# Patient Record
Sex: Female | Born: 1935 | Race: Black or African American | Hispanic: No | Marital: Single | Smoking: Never smoker
Health system: Southern US, Community
[De-identification: ages and names within clinical notes are randomized; demographics above are authoritative.]

## PROBLEM LIST (undated history)

## (undated) DIAGNOSIS — H269 Unspecified cataract: Secondary | ICD-10-CM

## (undated) DIAGNOSIS — I2510 Atherosclerotic heart disease of native coronary artery without angina pectoris: Secondary | ICD-10-CM

## (undated) DIAGNOSIS — R160 Hepatomegaly, not elsewhere classified: Secondary | ICD-10-CM

## (undated) DIAGNOSIS — F99 Mental disorder, not otherwise specified: Secondary | ICD-10-CM

## (undated) DIAGNOSIS — K219 Gastro-esophageal reflux disease without esophagitis: Secondary | ICD-10-CM

## (undated) DIAGNOSIS — F419 Anxiety disorder, unspecified: Secondary | ICD-10-CM

## (undated) DIAGNOSIS — E039 Hypothyroidism, unspecified: Secondary | ICD-10-CM

## (undated) DIAGNOSIS — S90562A Insect bite (nonvenomous), left ankle, initial encounter: Secondary | ICD-10-CM

## (undated) DIAGNOSIS — R51 Headache: Secondary | ICD-10-CM

## (undated) DIAGNOSIS — J189 Pneumonia, unspecified organism: Secondary | ICD-10-CM

## (undated) DIAGNOSIS — R252 Cramp and spasm: Secondary | ICD-10-CM

## (undated) DIAGNOSIS — I219 Acute myocardial infarction, unspecified: Secondary | ICD-10-CM

## (undated) DIAGNOSIS — K229 Disease of esophagus, unspecified: Secondary | ICD-10-CM

## (undated) DIAGNOSIS — E785 Hyperlipidemia, unspecified: Secondary | ICD-10-CM

## (undated) DIAGNOSIS — E7800 Pure hypercholesterolemia, unspecified: Secondary | ICD-10-CM

## (undated) DIAGNOSIS — D72819 Decreased white blood cell count, unspecified: Secondary | ICD-10-CM

## (undated) DIAGNOSIS — D649 Anemia, unspecified: Secondary | ICD-10-CM

## (undated) DIAGNOSIS — W57XXXA Bitten or stung by nonvenomous insect and other nonvenomous arthropods, initial encounter: Secondary | ICD-10-CM

## (undated) DIAGNOSIS — N2889 Other specified disorders of kidney and ureter: Secondary | ICD-10-CM

## (undated) DIAGNOSIS — I209 Angina pectoris, unspecified: Secondary | ICD-10-CM

## (undated) DIAGNOSIS — M1990 Unspecified osteoarthritis, unspecified site: Secondary | ICD-10-CM

## (undated) DIAGNOSIS — I10 Essential (primary) hypertension: Secondary | ICD-10-CM

## (undated) HISTORY — PX: TONSILLECTOMY: SHX5217

## (undated) HISTORY — DX: Decreased white blood cell count, unspecified: D72.819

## (undated) HISTORY — DX: Hyperlipidemia, unspecified: E78.5

## (undated) HISTORY — DX: Gastro-esophageal reflux disease without esophagitis: K21.9

## (undated) HISTORY — DX: Atherosclerotic heart disease of native coronary artery without angina pectoris: I25.10

## (undated) HISTORY — DX: Pneumonia, unspecified organism: J18.9

## (undated) HISTORY — PX: APPENDECTOMY: SHX54

## (undated) HISTORY — PX: ABDOMINAL HYSTERECTOMY: SHX81

## (undated) HISTORY — DX: Essential (primary) hypertension: I10

## (undated) HISTORY — PX: CHOLECYSTECTOMY OPEN: SUR202

## (undated) HISTORY — PX: BREAST BIOPSY: SHX20

## (undated) HISTORY — PX: HYSTEROTOMY: SHX1776

## (undated) HISTORY — PX: BREAST EXCISIONAL BIOPSY: SUR124

## (undated) HISTORY — DX: Hypothyroidism, unspecified: E03.9

## (undated) HISTORY — DX: Anemia, unspecified: D64.9

---

## 1997-10-27 ENCOUNTER — Inpatient Hospital Stay (HOSPITAL_COMMUNITY): Admission: EM | Admit: 1997-10-27 | Discharge: 1997-10-28 | Payer: Self-pay | Admitting: Emergency Medicine

## 1997-11-04 ENCOUNTER — Encounter: Admission: RE | Admit: 1997-11-04 | Discharge: 1997-11-04 | Payer: Self-pay | Admitting: Internal Medicine

## 1997-11-27 ENCOUNTER — Encounter: Admission: RE | Admit: 1997-11-27 | Discharge: 1997-11-27 | Payer: Self-pay | Admitting: Hematology and Oncology

## 1997-12-09 ENCOUNTER — Encounter: Admission: RE | Admit: 1997-12-09 | Discharge: 1997-12-09 | Payer: Self-pay | Admitting: Internal Medicine

## 1998-01-03 ENCOUNTER — Encounter: Admission: RE | Admit: 1998-01-03 | Discharge: 1998-01-03 | Payer: Self-pay | Admitting: Internal Medicine

## 1998-01-07 ENCOUNTER — Ambulatory Visit (HOSPITAL_COMMUNITY): Admission: RE | Admit: 1998-01-07 | Discharge: 1998-01-07 | Payer: Self-pay | Admitting: Internal Medicine

## 1998-02-14 ENCOUNTER — Encounter: Admission: RE | Admit: 1998-02-14 | Discharge: 1998-02-14 | Payer: Self-pay | Admitting: Internal Medicine

## 1998-04-02 ENCOUNTER — Encounter: Admission: RE | Admit: 1998-04-02 | Discharge: 1998-04-02 | Payer: Self-pay | Admitting: *Deleted

## 1998-04-15 ENCOUNTER — Encounter: Admission: RE | Admit: 1998-04-15 | Discharge: 1998-04-15 | Payer: Self-pay | Admitting: Internal Medicine

## 1998-04-28 ENCOUNTER — Encounter: Admission: RE | Admit: 1998-04-28 | Discharge: 1998-04-28 | Payer: Self-pay | Admitting: Internal Medicine

## 1998-05-14 ENCOUNTER — Other Ambulatory Visit: Admission: RE | Admit: 1998-05-14 | Discharge: 1998-05-14 | Payer: Self-pay | Admitting: Gastroenterology

## 1998-05-14 ENCOUNTER — Encounter: Payer: Self-pay | Admitting: Gastroenterology

## 1998-06-02 ENCOUNTER — Encounter: Admission: RE | Admit: 1998-06-02 | Discharge: 1998-06-02 | Payer: Self-pay | Admitting: Internal Medicine

## 1998-07-01 ENCOUNTER — Encounter: Admission: RE | Admit: 1998-07-01 | Discharge: 1998-07-01 | Payer: Self-pay | Admitting: Internal Medicine

## 1998-08-01 ENCOUNTER — Encounter: Admission: RE | Admit: 1998-08-01 | Discharge: 1998-08-01 | Payer: Self-pay | Admitting: Internal Medicine

## 1998-09-02 ENCOUNTER — Ambulatory Visit (HOSPITAL_COMMUNITY): Admission: RE | Admit: 1998-09-02 | Discharge: 1998-09-02 | Payer: Self-pay

## 1998-09-12 ENCOUNTER — Encounter: Admission: RE | Admit: 1998-09-12 | Discharge: 1998-09-12 | Payer: Self-pay | Admitting: Internal Medicine

## 1998-09-16 ENCOUNTER — Encounter: Admission: RE | Admit: 1998-09-16 | Discharge: 1998-09-16 | Payer: Self-pay | Admitting: Hematology and Oncology

## 1998-10-16 ENCOUNTER — Encounter: Admission: RE | Admit: 1998-10-16 | Discharge: 1998-10-16 | Payer: Self-pay | Admitting: Internal Medicine

## 1998-10-20 ENCOUNTER — Encounter: Payer: Self-pay | Admitting: *Deleted

## 1998-10-20 ENCOUNTER — Ambulatory Visit (HOSPITAL_COMMUNITY): Admission: RE | Admit: 1998-10-20 | Discharge: 1998-10-20 | Payer: Self-pay | Admitting: *Deleted

## 1998-11-11 ENCOUNTER — Encounter: Admission: RE | Admit: 1998-11-11 | Discharge: 1998-11-11 | Payer: Self-pay | Admitting: Hematology and Oncology

## 1998-11-13 ENCOUNTER — Ambulatory Visit (HOSPITAL_COMMUNITY): Admission: RE | Admit: 1998-11-13 | Discharge: 1998-11-13 | Payer: Self-pay | Admitting: Internal Medicine

## 1999-01-05 ENCOUNTER — Encounter: Admission: RE | Admit: 1999-01-05 | Discharge: 1999-01-05 | Payer: Self-pay | Admitting: Internal Medicine

## 1999-01-30 ENCOUNTER — Encounter: Admission: RE | Admit: 1999-01-30 | Discharge: 1999-01-30 | Payer: Self-pay | Admitting: Internal Medicine

## 1999-02-07 ENCOUNTER — Emergency Department (HOSPITAL_COMMUNITY): Admission: EM | Admit: 1999-02-07 | Discharge: 1999-02-07 | Payer: Self-pay | Admitting: Emergency Medicine

## 1999-02-08 ENCOUNTER — Encounter: Payer: Self-pay | Admitting: Emergency Medicine

## 1999-02-12 ENCOUNTER — Encounter: Admission: RE | Admit: 1999-02-12 | Discharge: 1999-02-12 | Payer: Self-pay | Admitting: Internal Medicine

## 1999-02-24 ENCOUNTER — Ambulatory Visit (HOSPITAL_COMMUNITY): Admission: RE | Admit: 1999-02-24 | Discharge: 1999-02-24 | Payer: Self-pay | Admitting: Gastroenterology

## 1999-03-11 ENCOUNTER — Ambulatory Visit (HOSPITAL_COMMUNITY): Admission: RE | Admit: 1999-03-11 | Discharge: 1999-03-11 | Payer: Self-pay | Admitting: Gastroenterology

## 1999-04-06 ENCOUNTER — Encounter: Admission: RE | Admit: 1999-04-06 | Discharge: 1999-04-06 | Payer: Self-pay | Admitting: Internal Medicine

## 1999-05-20 ENCOUNTER — Encounter: Admission: RE | Admit: 1999-05-20 | Discharge: 1999-05-20 | Payer: Self-pay | Admitting: Hematology and Oncology

## 1999-06-29 ENCOUNTER — Encounter: Admission: RE | Admit: 1999-06-29 | Discharge: 1999-06-29 | Payer: Self-pay | Admitting: Internal Medicine

## 1999-07-06 ENCOUNTER — Encounter: Admission: RE | Admit: 1999-07-06 | Discharge: 1999-07-06 | Payer: Self-pay | Admitting: Internal Medicine

## 1999-09-04 ENCOUNTER — Ambulatory Visit (HOSPITAL_COMMUNITY): Admission: RE | Admit: 1999-09-04 | Discharge: 1999-09-04 | Payer: Self-pay | Admitting: *Deleted

## 1999-10-06 ENCOUNTER — Encounter: Admission: RE | Admit: 1999-10-06 | Discharge: 1999-10-06 | Payer: Self-pay | Admitting: Internal Medicine

## 1999-10-06 ENCOUNTER — Encounter: Payer: Self-pay | Admitting: Internal Medicine

## 1999-11-21 ENCOUNTER — Encounter: Payer: Self-pay | Admitting: Emergency Medicine

## 1999-11-21 ENCOUNTER — Emergency Department (HOSPITAL_COMMUNITY): Admission: EM | Admit: 1999-11-21 | Discharge: 1999-11-21 | Payer: Self-pay | Admitting: Emergency Medicine

## 2000-01-07 ENCOUNTER — Encounter: Payer: Self-pay | Admitting: *Deleted

## 2000-01-07 ENCOUNTER — Ambulatory Visit (HOSPITAL_COMMUNITY): Admission: RE | Admit: 2000-01-07 | Discharge: 2000-01-07 | Payer: Self-pay | Admitting: *Deleted

## 2000-01-07 ENCOUNTER — Encounter (INDEPENDENT_AMBULATORY_CARE_PROVIDER_SITE_OTHER): Payer: Self-pay | Admitting: Specialist

## 2000-01-21 ENCOUNTER — Encounter (INDEPENDENT_AMBULATORY_CARE_PROVIDER_SITE_OTHER): Payer: Self-pay | Admitting: Specialist

## 2000-01-21 ENCOUNTER — Ambulatory Visit (HOSPITAL_COMMUNITY): Admission: RE | Admit: 2000-01-21 | Discharge: 2000-01-21 | Payer: Self-pay | Admitting: *Deleted

## 2000-04-20 ENCOUNTER — Encounter: Payer: Self-pay | Admitting: Internal Medicine

## 2000-04-20 ENCOUNTER — Encounter: Admission: RE | Admit: 2000-04-20 | Discharge: 2000-04-20 | Payer: Self-pay | Admitting: Internal Medicine

## 2000-05-02 ENCOUNTER — Encounter: Admission: RE | Admit: 2000-05-02 | Discharge: 2000-05-24 | Payer: Self-pay | Admitting: Orthopedic Surgery

## 2000-09-05 ENCOUNTER — Ambulatory Visit (HOSPITAL_COMMUNITY): Admission: RE | Admit: 2000-09-05 | Discharge: 2000-09-05 | Payer: Self-pay | Admitting: Internal Medicine

## 2000-09-05 ENCOUNTER — Encounter: Payer: Self-pay | Admitting: Internal Medicine

## 2000-09-17 ENCOUNTER — Encounter: Payer: Self-pay | Admitting: Emergency Medicine

## 2000-09-17 ENCOUNTER — Emergency Department (HOSPITAL_COMMUNITY): Admission: EM | Admit: 2000-09-17 | Discharge: 2000-09-17 | Payer: Self-pay | Admitting: Emergency Medicine

## 2001-01-10 ENCOUNTER — Encounter: Admission: RE | Admit: 2001-01-10 | Discharge: 2001-03-02 | Payer: Self-pay | Admitting: Orthopedic Surgery

## 2001-04-04 ENCOUNTER — Encounter: Payer: Self-pay | Admitting: Orthopedic Surgery

## 2001-04-04 ENCOUNTER — Inpatient Hospital Stay (HOSPITAL_COMMUNITY): Admission: RE | Admit: 2001-04-04 | Discharge: 2001-04-11 | Payer: Self-pay | Admitting: Orthopedic Surgery

## 2001-04-11 ENCOUNTER — Inpatient Hospital Stay
Admission: RE | Admit: 2001-04-11 | Discharge: 2001-04-22 | Payer: Self-pay | Admitting: Physical Medicine & Rehabilitation

## 2001-05-09 ENCOUNTER — Other Ambulatory Visit: Admission: RE | Admit: 2001-05-09 | Discharge: 2001-05-09 | Payer: Self-pay | Admitting: Internal Medicine

## 2001-08-29 ENCOUNTER — Encounter: Payer: Self-pay | Admitting: Internal Medicine

## 2001-08-29 ENCOUNTER — Encounter: Admission: RE | Admit: 2001-08-29 | Discharge: 2001-08-29 | Payer: Self-pay | Admitting: Internal Medicine

## 2001-09-06 ENCOUNTER — Ambulatory Visit (HOSPITAL_COMMUNITY): Admission: RE | Admit: 2001-09-06 | Discharge: 2001-09-06 | Payer: Self-pay | Admitting: Internal Medicine

## 2001-09-06 ENCOUNTER — Encounter: Payer: Self-pay | Admitting: Internal Medicine

## 2002-01-11 ENCOUNTER — Encounter: Payer: Self-pay | Admitting: Emergency Medicine

## 2002-01-11 ENCOUNTER — Emergency Department (HOSPITAL_COMMUNITY): Admission: EM | Admit: 2002-01-11 | Discharge: 2002-01-11 | Payer: Self-pay | Admitting: Emergency Medicine

## 2002-09-07 ENCOUNTER — Ambulatory Visit (HOSPITAL_COMMUNITY): Admission: RE | Admit: 2002-09-07 | Discharge: 2002-09-07 | Payer: Self-pay | Admitting: Internal Medicine

## 2002-09-07 ENCOUNTER — Encounter: Payer: Self-pay | Admitting: Internal Medicine

## 2002-11-08 ENCOUNTER — Encounter: Payer: Self-pay | Admitting: Emergency Medicine

## 2002-11-08 ENCOUNTER — Emergency Department (HOSPITAL_COMMUNITY): Admission: EM | Admit: 2002-11-08 | Discharge: 2002-11-08 | Payer: Self-pay | Admitting: Emergency Medicine

## 2003-01-04 ENCOUNTER — Encounter: Payer: Self-pay | Admitting: *Deleted

## 2003-01-04 ENCOUNTER — Emergency Department (HOSPITAL_COMMUNITY): Admission: EM | Admit: 2003-01-04 | Discharge: 2003-01-04 | Payer: Self-pay | Admitting: *Deleted

## 2003-01-07 ENCOUNTER — Ambulatory Visit (HOSPITAL_COMMUNITY): Admission: RE | Admit: 2003-01-07 | Discharge: 2003-01-07 | Payer: Self-pay | Admitting: Gastroenterology

## 2003-01-07 ENCOUNTER — Encounter: Payer: Self-pay | Admitting: Gastroenterology

## 2003-02-08 ENCOUNTER — Encounter: Admission: RE | Admit: 2003-02-08 | Discharge: 2003-02-08 | Payer: Self-pay | Admitting: Internal Medicine

## 2003-02-08 ENCOUNTER — Encounter: Payer: Self-pay | Admitting: Internal Medicine

## 2003-02-28 ENCOUNTER — Ambulatory Visit (HOSPITAL_COMMUNITY): Admission: RE | Admit: 2003-02-28 | Discharge: 2003-02-28 | Payer: Self-pay | Admitting: Internal Medicine

## 2003-03-06 ENCOUNTER — Encounter: Admission: RE | Admit: 2003-03-06 | Discharge: 2003-03-06 | Payer: Self-pay | Admitting: Internal Medicine

## 2003-03-06 ENCOUNTER — Encounter: Payer: Self-pay | Admitting: Internal Medicine

## 2003-03-20 ENCOUNTER — Emergency Department (HOSPITAL_COMMUNITY): Admission: EM | Admit: 2003-03-20 | Discharge: 2003-03-20 | Payer: Self-pay | Admitting: Emergency Medicine

## 2003-03-20 ENCOUNTER — Encounter: Payer: Self-pay | Admitting: Emergency Medicine

## 2003-09-11 ENCOUNTER — Ambulatory Visit (HOSPITAL_COMMUNITY): Admission: RE | Admit: 2003-09-11 | Discharge: 2003-09-11 | Payer: Self-pay | Admitting: Internal Medicine

## 2003-11-02 ENCOUNTER — Inpatient Hospital Stay (HOSPITAL_COMMUNITY): Admission: EM | Admit: 2003-11-02 | Discharge: 2003-11-07 | Payer: Self-pay | Admitting: Emergency Medicine

## 2003-12-16 ENCOUNTER — Encounter (HOSPITAL_COMMUNITY): Admission: RE | Admit: 2003-12-16 | Discharge: 2004-03-15 | Payer: Self-pay | Admitting: Cardiovascular Disease

## 2004-03-16 ENCOUNTER — Encounter (HOSPITAL_COMMUNITY): Admission: RE | Admit: 2004-03-16 | Discharge: 2004-04-13 | Payer: Self-pay | Admitting: Cardiovascular Disease

## 2004-04-07 ENCOUNTER — Ambulatory Visit (HOSPITAL_COMMUNITY): Admission: RE | Admit: 2004-04-07 | Discharge: 2004-04-07 | Payer: Self-pay | Admitting: Internal Medicine

## 2004-07-27 ENCOUNTER — Other Ambulatory Visit: Admission: RE | Admit: 2004-07-27 | Discharge: 2004-07-27 | Payer: Self-pay | Admitting: Internal Medicine

## 2004-09-07 ENCOUNTER — Encounter (INDEPENDENT_AMBULATORY_CARE_PROVIDER_SITE_OTHER): Payer: Self-pay | Admitting: *Deleted

## 2004-09-07 ENCOUNTER — Ambulatory Visit (HOSPITAL_COMMUNITY): Admission: RE | Admit: 2004-09-07 | Discharge: 2004-09-07 | Payer: Self-pay | Admitting: Gastroenterology

## 2004-09-07 ENCOUNTER — Encounter (INDEPENDENT_AMBULATORY_CARE_PROVIDER_SITE_OTHER): Payer: Self-pay | Admitting: Specialist

## 2004-09-15 ENCOUNTER — Ambulatory Visit (HOSPITAL_COMMUNITY): Admission: RE | Admit: 2004-09-15 | Discharge: 2004-09-15 | Payer: Self-pay | Admitting: Internal Medicine

## 2004-11-18 ENCOUNTER — Ambulatory Visit: Payer: Self-pay | Admitting: Family Medicine

## 2004-12-09 ENCOUNTER — Ambulatory Visit: Payer: Self-pay | Admitting: Family Medicine

## 2005-01-12 ENCOUNTER — Ambulatory Visit: Payer: Self-pay | Admitting: Family Medicine

## 2005-01-20 ENCOUNTER — Ambulatory Visit: Payer: Self-pay | Admitting: Family Medicine

## 2005-02-19 ENCOUNTER — Ambulatory Visit: Payer: Self-pay | Admitting: Family Medicine

## 2005-02-28 ENCOUNTER — Inpatient Hospital Stay (HOSPITAL_COMMUNITY): Admission: EM | Admit: 2005-02-28 | Discharge: 2005-03-01 | Payer: Self-pay | Admitting: Emergency Medicine

## 2005-02-28 ENCOUNTER — Ambulatory Visit: Payer: Self-pay | Admitting: Family Medicine

## 2005-03-19 ENCOUNTER — Ambulatory Visit: Payer: Self-pay | Admitting: Sports Medicine

## 2005-04-16 ENCOUNTER — Encounter (INDEPENDENT_AMBULATORY_CARE_PROVIDER_SITE_OTHER): Payer: Self-pay | Admitting: Specialist

## 2005-04-16 ENCOUNTER — Encounter (INDEPENDENT_AMBULATORY_CARE_PROVIDER_SITE_OTHER): Payer: Self-pay | Admitting: *Deleted

## 2005-04-16 ENCOUNTER — Ambulatory Visit (HOSPITAL_COMMUNITY): Admission: RE | Admit: 2005-04-16 | Discharge: 2005-04-16 | Payer: Self-pay | Admitting: Gastroenterology

## 2005-05-11 ENCOUNTER — Ambulatory Visit: Payer: Self-pay | Admitting: Family Medicine

## 2005-06-09 ENCOUNTER — Ambulatory Visit: Payer: Self-pay | Admitting: Family Medicine

## 2005-09-28 ENCOUNTER — Ambulatory Visit (HOSPITAL_COMMUNITY): Admission: RE | Admit: 2005-09-28 | Discharge: 2005-09-28 | Payer: Self-pay | Admitting: Family Medicine

## 2005-10-05 ENCOUNTER — Ambulatory Visit (HOSPITAL_COMMUNITY): Admission: RE | Admit: 2005-10-05 | Discharge: 2005-10-05 | Payer: Self-pay | Admitting: Internal Medicine

## 2005-10-29 ENCOUNTER — Inpatient Hospital Stay (HOSPITAL_BASED_OUTPATIENT_CLINIC_OR_DEPARTMENT_OTHER): Admission: RE | Admit: 2005-10-29 | Discharge: 2005-10-29 | Payer: Self-pay | Admitting: Cardiovascular Disease

## 2005-11-08 ENCOUNTER — Ambulatory Visit: Payer: Self-pay | Admitting: Internal Medicine

## 2005-11-15 LAB — CBC WITH DIFFERENTIAL/PLATELET
BASO%: 1 % (ref 0.0–2.0)
Basophils Absolute: 0 10*3/uL (ref 0.0–0.1)
EOS%: 0.9 % (ref 0.0–7.0)
Eosinophils Absolute: 0 10*3/uL (ref 0.0–0.5)
HCT: 36.5 % (ref 34.8–46.6)
HGB: 11.8 g/dL (ref 11.6–15.9)
LYMPH%: 39.8 % (ref 14.0–48.0)
MCH: 24 pg — ABNORMAL LOW (ref 26.0–34.0)
MCHC: 32.5 g/dL (ref 32.0–36.0)
MCV: 73.8 fL — ABNORMAL LOW (ref 81.0–101.0)
MONO#: 0.6 10*3/uL (ref 0.1–0.9)
MONO%: 14.5 % — ABNORMAL HIGH (ref 0.0–13.0)
NEUT#: 1.7 10*3/uL (ref 1.5–6.5)
NEUT%: 43.8 % (ref 39.6–76.8)
Platelets: 245 10*3/uL (ref 145–400)
RBC: 4.94 10*6/uL (ref 3.70–5.32)
RDW: 15.5 % — ABNORMAL HIGH (ref 11.3–14.5)
WBC: 3.9 10*3/uL (ref 3.9–10.0)
lymph#: 1.6 10*3/uL (ref 0.9–3.3)

## 2005-11-17 LAB — COMPREHENSIVE METABOLIC PANEL
ALT: 14 U/L (ref 0–40)
AST: 20 U/L (ref 0–37)
Albumin: 4 g/dL (ref 3.5–5.2)
Alkaline Phosphatase: 84 U/L (ref 39–117)
BUN: 10 mg/dL (ref 6–23)
CO2: 29 mEq/L (ref 19–32)
Calcium: 9.1 mg/dL (ref 8.4–10.5)
Chloride: 99 mEq/L (ref 96–112)
Creatinine, Ser: 0.7 mg/dL (ref 0.4–1.2)
Glucose, Bld: 76 mg/dL (ref 70–99)
Potassium: 4.4 mEq/L (ref 3.5–5.3)
Sodium: 136 mEq/L (ref 135–145)
Total Bilirubin: 0.3 mg/dL (ref 0.3–1.2)
Total Protein: 6.9 g/dL (ref 6.0–8.3)

## 2005-11-17 LAB — PROTEIN ELECTROPHORESIS, SERUM
Albumin ELP: 56.3 % (ref 55.8–66.1)
Alpha-1-Globulin: 4.6 % (ref 2.9–4.9)
Alpha-2-Globulin: 8.6 % (ref 7.1–11.8)
Beta 2: 5.2 % (ref 3.2–6.5)
Beta Globulin: 5.4 % (ref 4.7–7.2)
Gamma Globulin: 19.9 % — ABNORMAL HIGH (ref 11.1–18.8)
Total Protein, Serum Electrophoresis: 6.9 g/dL (ref 6.0–8.3)

## 2005-11-17 LAB — IRON AND TIBC
%SAT: 23 % (ref 20–55)
Iron: 61 ug/dL (ref 42–145)
TIBC: 267 ug/dL (ref 250–470)
UIBC: 206 ug/dL

## 2005-11-17 LAB — VITAMIN B12: Vitamin B-12: 925 pg/mL — ABNORMAL HIGH (ref 211–911)

## 2005-11-17 LAB — FOLATE RBC: RBC Folate: 460 ng/mL (ref 180–600)

## 2005-11-17 LAB — LACTATE DEHYDROGENASE: LDH: 161 U/L (ref 94–250)

## 2005-11-17 LAB — FERRITIN: Ferritin: 99 ng/mL (ref 10–291)

## 2005-12-01 LAB — CBC WITH DIFFERENTIAL/PLATELET
BASO%: 0.7 % (ref 0.0–2.0)
Basophils Absolute: 0 10*3/uL (ref 0.0–0.1)
EOS%: 1.5 % (ref 0.0–7.0)
Eosinophils Absolute: 0.1 10*3/uL (ref 0.0–0.5)
HCT: 34.7 % — ABNORMAL LOW (ref 34.8–46.6)
HGB: 11.2 g/dL — ABNORMAL LOW (ref 11.6–15.9)
LYMPH%: 47 % (ref 14.0–48.0)
MCH: 23.7 pg — ABNORMAL LOW (ref 26.0–34.0)
MCHC: 32.3 g/dL (ref 32.0–36.0)
MCV: 73.4 fL — ABNORMAL LOW (ref 81.0–101.0)
MONO#: 0.5 10*3/uL (ref 0.1–0.9)
MONO%: 15.6 % — ABNORMAL HIGH (ref 0.0–13.0)
NEUT#: 1.2 10*3/uL — ABNORMAL LOW (ref 1.5–6.5)
NEUT%: 35.2 % — ABNORMAL LOW (ref 39.6–76.8)
Platelets: 181 10*3/uL (ref 145–400)
RBC: 4.73 10*6/uL (ref 3.70–5.32)
RDW: 15.4 % — ABNORMAL HIGH (ref 11.3–14.5)
WBC: 3.3 10*3/uL — ABNORMAL LOW (ref 3.9–10.0)
lymph#: 1.6 10*3/uL (ref 0.9–3.3)

## 2005-12-01 LAB — LACTATE DEHYDROGENASE: LDH: 164 U/L (ref 94–250)

## 2005-12-15 ENCOUNTER — Ambulatory Visit (HOSPITAL_COMMUNITY): Admission: RE | Admit: 2005-12-15 | Discharge: 2005-12-15 | Payer: Self-pay | Admitting: Internal Medicine

## 2005-12-15 ENCOUNTER — Encounter (INDEPENDENT_AMBULATORY_CARE_PROVIDER_SITE_OTHER): Payer: Self-pay | Admitting: Specialist

## 2005-12-15 ENCOUNTER — Ambulatory Visit: Payer: Self-pay | Admitting: Internal Medicine

## 2005-12-21 LAB — CBC WITH DIFFERENTIAL/PLATELET
BASO%: 0.5 % (ref 0.0–2.0)
Basophils Absolute: 0 10*3/uL (ref 0.0–0.1)
EOS%: 1.2 % (ref 0.0–7.0)
Eosinophils Absolute: 0 10*3/uL (ref 0.0–0.5)
HCT: 35.3 % (ref 34.8–46.6)
HGB: 11.4 g/dL — ABNORMAL LOW (ref 11.6–15.9)
LYMPH%: 44.8 % (ref 14.0–48.0)
MCH: 23.5 pg — ABNORMAL LOW (ref 26.0–34.0)
MCHC: 32.1 g/dL (ref 32.0–36.0)
MCV: 73.3 fL — ABNORMAL LOW (ref 81.0–101.0)
MONO#: 0.6 10*3/uL (ref 0.1–0.9)
MONO%: 21.7 % — ABNORMAL HIGH (ref 0.0–13.0)
NEUT#: 0.9 10*3/uL — ABNORMAL LOW (ref 1.5–6.5)
NEUT%: 31.8 % — ABNORMAL LOW (ref 39.6–76.8)
Platelets: 186 10*3/uL (ref 145–400)
RBC: 4.82 10*6/uL (ref 3.70–5.32)
RDW: 14.6 % — ABNORMAL HIGH (ref 11.3–14.5)
WBC: 3 10*3/uL — ABNORMAL LOW (ref 3.9–10.0)
lymph#: 1.3 10*3/uL (ref 0.9–3.3)

## 2005-12-28 ENCOUNTER — Ambulatory Visit (HOSPITAL_COMMUNITY): Admission: RE | Admit: 2005-12-28 | Discharge: 2005-12-28 | Payer: Self-pay | Admitting: Internal Medicine

## 2006-01-25 LAB — CBC WITH DIFFERENTIAL/PLATELET
BASO%: 0.8 % (ref 0.0–2.0)
Basophils Absolute: 0 10*3/uL (ref 0.0–0.1)
EOS%: 2.3 % (ref 0.0–7.0)
Eosinophils Absolute: 0.1 10*3/uL (ref 0.0–0.5)
HCT: 34.6 % — ABNORMAL LOW (ref 34.8–46.6)
HGB: 11 g/dL — ABNORMAL LOW (ref 11.6–15.9)
LYMPH%: 45.9 % (ref 14.0–48.0)
MCH: 22.8 pg — ABNORMAL LOW (ref 26.0–34.0)
MCHC: 31.9 g/dL — ABNORMAL LOW (ref 32.0–36.0)
MCV: 71.5 fL — ABNORMAL LOW (ref 81.0–101.0)
MONO#: 0.8 10*3/uL (ref 0.1–0.9)
MONO%: 21 % — ABNORMAL HIGH (ref 0.0–13.0)
NEUT#: 1.1 10*3/uL — ABNORMAL LOW (ref 1.5–6.5)
NEUT%: 30 % — ABNORMAL LOW (ref 39.6–76.8)
Platelets: 200 10*3/uL (ref 145–400)
RBC: 4.84 10*6/uL (ref 3.70–5.32)
RDW: 13.9 % (ref 11.3–14.5)
WBC: 3.6 10*3/uL — ABNORMAL LOW (ref 3.9–10.0)
lymph#: 1.7 10*3/uL (ref 0.9–3.3)

## 2006-01-25 LAB — LACTATE DEHYDROGENASE: LDH: 179 U/L (ref 94–250)

## 2006-02-04 ENCOUNTER — Ambulatory Visit: Payer: Self-pay | Admitting: Internal Medicine

## 2006-02-08 ENCOUNTER — Ambulatory Visit: Payer: Self-pay | Admitting: *Deleted

## 2006-02-22 ENCOUNTER — Ambulatory Visit: Payer: Self-pay | Admitting: Internal Medicine

## 2006-02-24 LAB — CBC WITH DIFFERENTIAL/PLATELET
BASO%: 3 % — ABNORMAL HIGH (ref 0.0–2.0)
Basophils Absolute: 0.1 10*3/uL (ref 0.0–0.1)
EOS%: 2.4 % (ref 0.0–7.0)
Eosinophils Absolute: 0.1 10*3/uL (ref 0.0–0.5)
HCT: 32.3 % — ABNORMAL LOW (ref 34.8–46.6)
HGB: 10.4 g/dL — ABNORMAL LOW (ref 11.6–15.9)
LYMPH%: 51.1 % — ABNORMAL HIGH (ref 14.0–48.0)
MCH: 22.1 pg — ABNORMAL LOW (ref 26.0–34.0)
MCHC: 32.2 g/dL (ref 32.0–36.0)
MCV: 68.8 fL — ABNORMAL LOW (ref 81.0–101.0)
MONO#: 0.6 10*3/uL (ref 0.1–0.9)
MONO%: 18.7 % — ABNORMAL HIGH (ref 0.0–13.0)
NEUT#: 0.8 10*3/uL — ABNORMAL LOW (ref 1.5–6.5)
NEUT%: 24.8 % — ABNORMAL LOW (ref 39.6–76.8)
Platelets: 171 10*3/uL (ref 145–400)
RBC: 4.69 10*6/uL (ref 3.70–5.32)
RDW: 14.5 % (ref 11.3–14.5)
WBC: 3.2 10*3/uL — ABNORMAL LOW (ref 3.9–10.0)
lymph#: 1.6 10*3/uL (ref 0.9–3.3)

## 2006-02-24 LAB — LACTATE DEHYDROGENASE: LDH: 165 U/L (ref 94–250)

## 2006-02-28 LAB — HEMOGLOBINOPATHY EVALUATION
Hgb A2 Quant: 2.5 % (ref 2.2–3.2)
Hgb A: 96.8 % (ref 96.8–97.8)
Hgb F Quant: 0.7 % — ABNORMAL HIGH (ref 0.0–0.5)
Hgb S Quant: 0 % (ref 0.0–0.0)

## 2006-03-10 ENCOUNTER — Ambulatory Visit: Payer: Self-pay | Admitting: Internal Medicine

## 2006-03-22 ENCOUNTER — Ambulatory Visit: Payer: Self-pay | Admitting: Internal Medicine

## 2006-03-29 ENCOUNTER — Encounter (HOSPITAL_COMMUNITY): Admission: RE | Admit: 2006-03-29 | Discharge: 2006-06-27 | Payer: Self-pay | Admitting: Internal Medicine

## 2006-04-07 ENCOUNTER — Ambulatory Visit: Payer: Self-pay | Admitting: Internal Medicine

## 2006-04-07 LAB — CBC WITH DIFFERENTIAL/PLATELET
BASO%: 0.1 % (ref 0.0–2.0)
Basophils Absolute: 0 10*3/uL (ref 0.0–0.1)
EOS%: 0.5 % (ref 0.0–7.0)
Eosinophils Absolute: 0 10*3/uL (ref 0.0–0.5)
HCT: 33 % — ABNORMAL LOW (ref 34.8–46.6)
HGB: 10.8 g/dL — ABNORMAL LOW (ref 11.6–15.9)
LYMPH%: 54.3 % — ABNORMAL HIGH (ref 14.0–48.0)
MCH: 21.8 pg — ABNORMAL LOW (ref 26.0–34.0)
MCHC: 32.7 g/dL (ref 32.0–36.0)
MCV: 66.7 fL — ABNORMAL LOW (ref 81.0–101.0)
MONO#: 0.5 10*3/uL (ref 0.1–0.9)
MONO%: 15.1 % — ABNORMAL HIGH (ref 0.0–13.0)
NEUT#: 1 10*3/uL — ABNORMAL LOW (ref 1.5–6.5)
NEUT%: 30 % — ABNORMAL LOW (ref 39.6–76.8)
Platelets: 259 10*3/uL (ref 145–400)
RBC: 4.94 10*6/uL (ref 3.70–5.32)
RDW: 14.9 % — ABNORMAL HIGH (ref 11.3–14.5)
WBC: 3.2 10*3/uL — ABNORMAL LOW (ref 3.9–10.0)
lymph#: 1.7 10*3/uL (ref 0.9–3.3)

## 2006-04-07 LAB — LACTATE DEHYDROGENASE: LDH: 144 U/L (ref 94–250)

## 2006-04-12 LAB — HEMOGLOBINOPATHY EVALUATION
Hemoglobin Other: 0 % (ref 0.0–0.0)
Hgb A2 Quant: 2.5 % (ref 2.2–3.2)
Hgb A: 96.6 % — ABNORMAL LOW (ref 96.8–97.8)
Hgb F Quant: 0.9 % — ABNORMAL HIGH (ref 0.0–0.5)
Hgb S Quant: 0 % (ref 0.0–0.0)

## 2006-04-18 ENCOUNTER — Ambulatory Visit (HOSPITAL_COMMUNITY): Admission: RE | Admit: 2006-04-18 | Discharge: 2006-04-18 | Payer: Self-pay | Admitting: Internal Medicine

## 2006-04-23 ENCOUNTER — Inpatient Hospital Stay (HOSPITAL_COMMUNITY): Admission: EM | Admit: 2006-04-23 | Discharge: 2006-04-26 | Payer: Self-pay | Admitting: Emergency Medicine

## 2006-04-29 ENCOUNTER — Ambulatory Visit: Payer: Self-pay | Admitting: Internal Medicine

## 2006-05-19 ENCOUNTER — Ambulatory Visit: Payer: Self-pay | Admitting: Gastroenterology

## 2006-06-13 ENCOUNTER — Ambulatory Visit: Payer: Self-pay | Admitting: Internal Medicine

## 2006-08-06 ENCOUNTER — Emergency Department (HOSPITAL_COMMUNITY): Admission: EM | Admit: 2006-08-06 | Discharge: 2006-08-06 | Payer: Self-pay | Admitting: Emergency Medicine

## 2006-09-15 DIAGNOSIS — E785 Hyperlipidemia, unspecified: Secondary | ICD-10-CM | POA: Insufficient documentation

## 2006-09-15 DIAGNOSIS — J45909 Unspecified asthma, uncomplicated: Secondary | ICD-10-CM | POA: Insufficient documentation

## 2006-09-15 DIAGNOSIS — N3941 Urge incontinence: Secondary | ICD-10-CM | POA: Insufficient documentation

## 2006-09-15 DIAGNOSIS — D126 Benign neoplasm of colon, unspecified: Secondary | ICD-10-CM | POA: Insufficient documentation

## 2006-09-15 DIAGNOSIS — J309 Allergic rhinitis, unspecified: Secondary | ICD-10-CM | POA: Insufficient documentation

## 2006-09-15 DIAGNOSIS — I2510 Atherosclerotic heart disease of native coronary artery without angina pectoris: Secondary | ICD-10-CM | POA: Insufficient documentation

## 2006-09-15 DIAGNOSIS — K5732 Diverticulitis of large intestine without perforation or abscess without bleeding: Secondary | ICD-10-CM | POA: Insufficient documentation

## 2006-09-15 DIAGNOSIS — I10 Essential (primary) hypertension: Secondary | ICD-10-CM | POA: Insufficient documentation

## 2006-09-15 DIAGNOSIS — M1990 Unspecified osteoarthritis, unspecified site: Secondary | ICD-10-CM | POA: Insufficient documentation

## 2006-09-15 DIAGNOSIS — E669 Obesity, unspecified: Secondary | ICD-10-CM | POA: Insufficient documentation

## 2006-09-15 DIAGNOSIS — K219 Gastro-esophageal reflux disease without esophagitis: Secondary | ICD-10-CM | POA: Insufficient documentation

## 2006-10-04 ENCOUNTER — Ambulatory Visit (HOSPITAL_COMMUNITY): Admission: RE | Admit: 2006-10-04 | Discharge: 2006-10-04 | Payer: Self-pay | Admitting: Surgery

## 2006-11-21 ENCOUNTER — Ambulatory Visit (HOSPITAL_COMMUNITY): Admission: RE | Admit: 2006-11-21 | Discharge: 2006-11-21 | Payer: Self-pay | Admitting: Internal Medicine

## 2007-05-04 ENCOUNTER — Ambulatory Visit: Payer: Self-pay | Admitting: Gastroenterology

## 2007-05-16 ENCOUNTER — Ambulatory Visit (HOSPITAL_BASED_OUTPATIENT_CLINIC_OR_DEPARTMENT_OTHER): Admission: RE | Admit: 2007-05-16 | Discharge: 2007-05-16 | Payer: Self-pay | Admitting: Urology

## 2007-06-20 ENCOUNTER — Ambulatory Visit: Payer: Self-pay | Admitting: Internal Medicine

## 2007-06-26 DIAGNOSIS — F32A Depression, unspecified: Secondary | ICD-10-CM | POA: Insufficient documentation

## 2007-06-26 DIAGNOSIS — Z9089 Acquired absence of other organs: Secondary | ICD-10-CM | POA: Insufficient documentation

## 2007-06-26 DIAGNOSIS — Z9079 Acquired absence of other genital organ(s): Secondary | ICD-10-CM | POA: Insufficient documentation

## 2007-06-26 DIAGNOSIS — F329 Major depressive disorder, single episode, unspecified: Secondary | ICD-10-CM

## 2007-07-03 ENCOUNTER — Ambulatory Visit: Payer: Self-pay | Admitting: Internal Medicine

## 2007-07-03 DIAGNOSIS — R059 Cough, unspecified: Secondary | ICD-10-CM | POA: Insufficient documentation

## 2007-07-03 DIAGNOSIS — R05 Cough: Secondary | ICD-10-CM

## 2007-07-07 ENCOUNTER — Observation Stay (HOSPITAL_COMMUNITY): Admission: EM | Admit: 2007-07-07 | Discharge: 2007-07-08 | Payer: Self-pay | Admitting: *Deleted

## 2007-07-18 ENCOUNTER — Encounter: Payer: Self-pay | Admitting: Internal Medicine

## 2007-07-27 ENCOUNTER — Ambulatory Visit: Payer: Self-pay | Admitting: Internal Medicine

## 2007-07-30 ENCOUNTER — Emergency Department (HOSPITAL_COMMUNITY): Admission: EM | Admit: 2007-07-30 | Discharge: 2007-07-30 | Payer: Self-pay | Admitting: Emergency Medicine

## 2007-08-04 ENCOUNTER — Ambulatory Visit: Payer: Self-pay | Admitting: Gastroenterology

## 2007-08-04 ENCOUNTER — Emergency Department (HOSPITAL_COMMUNITY): Admission: EM | Admit: 2007-08-04 | Discharge: 2007-08-04 | Payer: Self-pay | Admitting: Emergency Medicine

## 2007-08-09 ENCOUNTER — Ambulatory Visit (HOSPITAL_COMMUNITY): Admission: RE | Admit: 2007-08-09 | Discharge: 2007-08-09 | Payer: Self-pay | Admitting: Urology

## 2007-08-10 ENCOUNTER — Ambulatory Visit: Payer: Self-pay | Admitting: Internal Medicine

## 2007-08-11 ENCOUNTER — Telehealth (INDEPENDENT_AMBULATORY_CARE_PROVIDER_SITE_OTHER): Payer: Self-pay | Admitting: *Deleted

## 2007-08-15 HISTORY — PX: CARDIOVASCULAR STRESS TEST: SHX262

## 2007-08-24 ENCOUNTER — Telehealth: Payer: Self-pay | Admitting: Adult Health

## 2007-09-12 ENCOUNTER — Ambulatory Visit: Payer: Self-pay | Admitting: Internal Medicine

## 2007-09-26 ENCOUNTER — Ambulatory Visit: Payer: Self-pay | Admitting: Internal Medicine

## 2007-10-03 ENCOUNTER — Encounter (INDEPENDENT_AMBULATORY_CARE_PROVIDER_SITE_OTHER): Payer: Self-pay | Admitting: Urology

## 2007-10-03 ENCOUNTER — Ambulatory Visit (HOSPITAL_COMMUNITY): Admission: RE | Admit: 2007-10-03 | Discharge: 2007-10-03 | Payer: Self-pay | Admitting: Urology

## 2007-10-25 ENCOUNTER — Ambulatory Visit: Payer: Self-pay | Admitting: Internal Medicine

## 2007-12-07 ENCOUNTER — Ambulatory Visit: Payer: Self-pay | Admitting: Internal Medicine

## 2008-03-15 ENCOUNTER — Ambulatory Visit: Payer: Self-pay | Admitting: Internal Medicine

## 2008-05-24 ENCOUNTER — Ambulatory Visit: Payer: Self-pay | Admitting: Internal Medicine

## 2008-06-04 ENCOUNTER — Encounter: Payer: Self-pay | Admitting: Gastroenterology

## 2008-06-07 ENCOUNTER — Ambulatory Visit: Payer: Self-pay | Admitting: Internal Medicine

## 2008-07-15 ENCOUNTER — Telehealth (INDEPENDENT_AMBULATORY_CARE_PROVIDER_SITE_OTHER): Payer: Self-pay | Admitting: *Deleted

## 2008-07-17 ENCOUNTER — Ambulatory Visit: Payer: Self-pay | Admitting: Internal Medicine

## 2008-07-29 ENCOUNTER — Ambulatory Visit: Payer: Self-pay | Admitting: Internal Medicine

## 2008-07-30 ENCOUNTER — Encounter (INDEPENDENT_AMBULATORY_CARE_PROVIDER_SITE_OTHER): Payer: Self-pay | Admitting: *Deleted

## 2008-08-08 ENCOUNTER — Ambulatory Visit: Payer: Self-pay | Admitting: Gastroenterology

## 2008-08-08 DIAGNOSIS — K5900 Constipation, unspecified: Secondary | ICD-10-CM | POA: Insufficient documentation

## 2008-08-09 LAB — CONVERTED CEMR LAB
ALT: 17 units/L (ref 0–35)
AST: 29 units/L (ref 0–37)
Albumin: 3.8 g/dL (ref 3.5–5.2)
Alkaline Phosphatase: 59 units/L (ref 39–117)
BUN: 9 mg/dL (ref 6–23)
Basophils Absolute: 0 10*3/uL (ref 0.0–0.1)
Basophils Relative: 0.5 % (ref 0.0–3.0)
CO2: 34 meq/L — ABNORMAL HIGH (ref 19–32)
Calcium: 9.5 mg/dL (ref 8.4–10.5)
Chloride: 103 meq/L (ref 96–112)
Creatinine, Ser: 0.8 mg/dL (ref 0.4–1.2)
Eosinophils Absolute: 0.1 10*3/uL (ref 0.0–0.7)
Eosinophils Relative: 2.6 % (ref 0.0–5.0)
GFR calc Af Amer: 90 mL/min
GFR calc non Af Amer: 75 mL/min
Glucose, Bld: 75 mg/dL (ref 70–99)
HCT: 38.3 % (ref 36.0–46.0)
Hemoglobin: 12.4 g/dL (ref 12.0–15.0)
Lipase: 19 units/L (ref 11.0–59.0)
Lymphocytes Relative: 45.2 % (ref 12.0–46.0)
MCHC: 32.3 g/dL (ref 30.0–36.0)
MCV: 75.3 fL — ABNORMAL LOW (ref 78.0–100.0)
Monocytes Absolute: 0.4 10*3/uL (ref 0.1–1.0)
Monocytes Relative: 12.5 % — ABNORMAL HIGH (ref 3.0–12.0)
Neutro Abs: 1.2 10*3/uL — ABNORMAL LOW (ref 1.4–7.7)
Neutrophils Relative %: 39.2 % — ABNORMAL LOW (ref 43.0–77.0)
Platelets: 166 10*3/uL (ref 150–400)
Potassium: 4 meq/L (ref 3.5–5.1)
RBC: 5.08 M/uL (ref 3.87–5.11)
RDW: 14.5 % (ref 11.5–14.6)
Sodium: 140 meq/L (ref 135–145)
Total Bilirubin: 0.7 mg/dL (ref 0.3–1.2)
Total Protein: 6.8 g/dL (ref 6.0–8.3)
WBC: 3 10*3/uL — ABNORMAL LOW (ref 4.5–10.5)

## 2008-08-16 ENCOUNTER — Ambulatory Visit: Payer: Self-pay | Admitting: Gastroenterology

## 2008-08-19 ENCOUNTER — Telehealth (INDEPENDENT_AMBULATORY_CARE_PROVIDER_SITE_OTHER): Payer: Self-pay | Admitting: *Deleted

## 2008-08-19 LAB — CONVERTED CEMR LAB
Ferritin: 54.9 ng/mL (ref 10.0–291.0)
Iron: 65 ug/dL (ref 42–145)
Saturation Ratios: 21.5 % (ref 20.0–50.0)
Transferrin: 215.5 mg/dL (ref >=212.0)

## 2008-08-21 ENCOUNTER — Telehealth: Payer: Self-pay | Admitting: Gastroenterology

## 2008-08-22 ENCOUNTER — Encounter: Payer: Self-pay | Admitting: Internal Medicine

## 2008-08-28 ENCOUNTER — Encounter (INDEPENDENT_AMBULATORY_CARE_PROVIDER_SITE_OTHER): Payer: Self-pay

## 2008-09-02 ENCOUNTER — Telehealth: Payer: Self-pay | Admitting: Gastroenterology

## 2008-09-12 ENCOUNTER — Ambulatory Visit: Payer: Self-pay | Admitting: Gastroenterology

## 2008-09-13 LAB — CONVERTED CEMR LAB
Fecal Occult Blood: NEGATIVE
OCCULT 1: NEGATIVE
OCCULT 2: NEGATIVE
OCCULT 3: NEGATIVE
OCCULT 4: NEGATIVE
OCCULT 5: NEGATIVE

## 2008-10-11 ENCOUNTER — Encounter (INDEPENDENT_AMBULATORY_CARE_PROVIDER_SITE_OTHER): Payer: Self-pay | Admitting: *Deleted

## 2008-12-02 ENCOUNTER — Ambulatory Visit (HOSPITAL_COMMUNITY): Admission: RE | Admit: 2008-12-02 | Discharge: 2008-12-02 | Payer: Self-pay | Admitting: Otolaryngology

## 2009-05-09 ENCOUNTER — Encounter: Admission: RE | Admit: 2009-05-09 | Discharge: 2009-05-09 | Payer: Self-pay | Admitting: General Surgery

## 2009-06-04 ENCOUNTER — Inpatient Hospital Stay (HOSPITAL_COMMUNITY): Admission: RE | Admit: 2009-06-04 | Discharge: 2009-06-09 | Payer: Self-pay | Admitting: General Surgery

## 2010-03-03 ENCOUNTER — Ambulatory Visit: Payer: Self-pay | Admitting: Cardiovascular Disease

## 2010-05-11 ENCOUNTER — Encounter: Admission: RE | Admit: 2010-05-11 | Discharge: 2010-05-11 | Payer: Self-pay | Admitting: Internal Medicine

## 2010-06-25 ENCOUNTER — Ambulatory Visit (HOSPITAL_COMMUNITY): Admission: RE | Admit: 2010-06-25 | Payer: Self-pay | Source: Home / Self Care | Admitting: General Surgery

## 2010-08-09 ENCOUNTER — Encounter: Payer: Self-pay | Admitting: Internal Medicine

## 2010-08-20 NOTE — Procedures (Signed)
Summary: Gastroenterology EGD  Gastroenterology EGD   Imported By: Milford Cage CMA 09/22/2007 15:10:45  _____________________________________________________________________  External Attachment:    Type:   Image     Comment:   External Document

## 2010-08-20 NOTE — Op Note (Signed)
Summary: EGD with biopsy  NAME:  [name] [name]            ACCOUNT NO.:  [id number]   MEDICAL RECORD NO.:  [id number]          PATIENT TYPE:  AMB   LOCATION:  ENDO                         FACILITY:  [facility]   PHYSICIAN:  John C. [name] Fireman, M.D.    DATE OF BIRTH:  1931/11/28   DATE OF PROCEDURE:  04/16/2005  DATE OF DISCHARGE:                                 OPERATIVE REPORT   PROCEDURE:  Esophagogastroduodenoscopy with biopsy.   INDICATIONS FOR PROCEDURE:  Atypical chest pain with suspected  gastroesophageal etiology also reported history of some sort of gastric  polyp or mass in the past.   DESCRIPTION OF PROCEDURE:  The patient was placed in the left lateral  decubitus position then placed on the pulse monitor with continuous low-flow  oxygen delivered by nasal cannula. She was sedated with 37.5 mcg IV fentanyl  and 3 mg IV Versed. The Olympus video endoscope was advanced under direct  vision into the oropharynx and esophagus. The esophagus was straight and of  normal caliber with the squamocolumnar line at 38 cm. There was no visible  hiatal hernia, ring, stricture or other abnormalities of the esophagus or GE  junction. The stomach was entered and a small amount of liquid secretions  were suctioned from the fundus. Retroflexed view of the cardia was  unremarkable. In the fundus and body, there were several small to moderate  size gastric polyps the largest of which was about 8-9 mm in diameter. There  were some small as small as 2-3 and estimated number of total polyps seen  was about 10-12. Deep biopsies were taken of the two largest of these and  they were fairly soft. There were also 2 or 3 elevated folds in the distal  antrum with some slight overlying erythema, possibly consistent with mild  antral gastritis. The pylorus was nondeformed and easily allowed passage of  the endoscope tip into the duodenum. Both the bulb and second portion were  well inspected and appeared to be  within normal limits. The scope was then  withdrawn and the patient returned to the recovery room in stable condition.  She tolerated the procedure well and there were no immediate complications.   IMPRESSION:  Multiple small to moderate gastric polyps, otherwise normal  study.   PLAN:  Await biopsy results to rule out malignancy or adenomatous polyps  which would probably warrant removal.           ______________________________  Everardo All. [name] Fireman, M.D.     JCH/MEDQ  D:  04/16/2005  T:  04/16/2005  Job:  [id number]   cc:   Dr. Adah Salvage [facility]           SP Surgical Pathology - STATUS: Final             By: SMIR MD , Jessica Priest           Perform Date: 29Sep06 00:01  Ordered By: [name] Fireman MD , Everardo All             Ordered Date: 29Sep06 11:00  Facility: [facility]  Department: CPATH  Service Report Text  [facility]   [address] [place], [place] [address]   [phone]    REPORT OF SURGICAL PATHOLOGY    Case #: [id number]   Patient Name: [name] [name] L.   PID: [id number]   Pathologist: Havery Moros, MD   DOB/Age Nov 05, 1931 (Age: 77) Gender: F   Date Taken: 04/16/2005   Date Received: 04/16/2005    FINAL DIAGNOSIS    ***MICROSCOPIC EXAMINATION AND DIAGNOSIS***    STOMACH, POLYPS: BENIGN FUNDIC GLAND POLYPS.    COMMENT   A Warthin-Starry stain is performed to determine the possibility   of the presence of Helicobacter pylori. The Warthin-Starry stain   is negative for organisms of Helicobacter pylori. The control(s)   stained appropriately. (BNS:mw 04/19/05)    mw   Date Reported: 04/19/2005 Havery Moros, MD   *** Electronically Signed Out By BNS ***    Clinical information   (as)    specimen(s) obtained   Stomach, polyp(s)    Gross Description   Received in formalin are tan, soft tissue fragments that are   submitted in toto. Number: multiple.   Size: 0.5 x 0.3 x 0.2 cm, one block. (SW:caf  04/16/05)    cf/

## 2010-08-20 NOTE — Progress Notes (Signed)
Summary: cough  Phone Note Call from Patient   Caller: Patient Call For: wert Summary of Call: pt wants to know if TP, who pt last saw, wants her to take some of her hydrocodone w/ cough syrup for her cough. pt has appt on 2/24.  Patient's chart has been requested. Initial call taken by: Tivis Ringer,  August 24, 2007 9:48 AM  Follow-up for Phone Call        [name] is this okay for pt to do? Did not see in your dictation anything about cough syrup being used. Please advise. Follow-up by: Reynaldo Minium CMA,  August 24, 2007 12:11 PM  Additional Follow-up for Phone Call Additional follow up Details #1::        pt to use delsym for cough, and may use vicodin for breakthrough cough Additional Follow-up by: Rubye Oaks NP,  August 24, 2007 2:16 PM

## 2010-08-20 NOTE — Assessment & Plan Note (Signed)
Summary: YEARLY F-UP/YF    History of Present Illness Visit Type: follow up Primary GI MD: Elie Goody MD [facility] Primary Provider: Buren Kos, MD Chief Complaint: Patient here for yearly follow up.  Pt states that she still has some problems with constipation on occasion.  She says she does sometimes have dark stool but attributes this to her diet.  Patient also c/o some lower abdominal fullness as well.  She states that she often becomes nauseated and coughs frequently as well. History of Present Illness:   Mrs. [name] complains of occasional constipation and vague lower abdominal fullness. She has intermittent reflux symptoms and they are well controlled on Zegerid twice daily. She takes MiraLax and fiber supplements and appears to have adequate management of her constipation. She  was evaluated by Dr. Vernie Ammons and apparently no genitourinary problems were uncovered. She has gained 11 pounds since I last saw her in January 2009. She underwent an abdominal and pelvic CT scan on 06/04/2008 which was negative. Her last colonoscopy was performed by Dr. Reece Agar in February 2006 with findings of diverticulosis and a small hyperplastic colon polyp.   GI Review of Systems    Reports acid reflux, bloating, chest pain, and  heartburn.      Denies abdominal pain, belching, dysphagia with liquids, dysphagia with solids, loss of appetite, nausea, vomiting, vomiting blood, weight loss, and  weight gain.      Reports constipation and  diverticulosis.     Denies anal fissure, black tarry stools, change in bowel habit, diarrhea, fecal incontinence, heme positive stool, hemorrhoids, irritable bowel syndrome, jaundice, light color stool, liver problems, rectal bleeding, and  rectal pain.    Prior Medications Reviewed Using: List Brought by Patient  Updated Prior Medication List: KLOR-CON M20 20 MEQ  TBCR (POTASSIUM CHLORIDE CRYS CR) Take 1 tablet by mouth once a day BAYER ASPIRIN EC LOW DOSE 81 MG   TBEC (ASPIRIN) Take 1 tablet by mouth once a day VITAMIN C 500 MG TABS (ASCORBIC ACID) 1 once daily ZEGERID 40-1100 MG CAPS (OMEPRAZOLE-SODIUM BICARBONATE) 1 two times a day METOCLOPRAMIDE HCL 10 MG  TABS (METOCLOPRAMIDE HCL) 1 pill 4 times a day HYDROCHLOROTHIAZIDE 25 MG  TABS (HYDROCHLOROTHIAZIDE) 1 pill a day BYSTOLIC 10 MG  TABS (NEBIVOLOL HCL) Take 1 tablet by mouth every morning SYNTHROID 112 MCG TABS (LEVOTHYROXINE SODIUM) 1 by mouth once daily PLAVIX 75 MG  TABS (CLOPIDOGREL BISULFATE) Take 1 tablet by mouth once a day CITRUCEL   POWD (METHYLCELLULOSE (LAXATIVE)) 1 tbsp every morning and 1 tbsp at bedtime TRAZODONE HCL 100 MG  TABS (TRAZODONE HCL) take one tablet at bedtime VITAMIN D [address] UNIT  CAPS (ERGOCALCIFEROL) Take 1 tablet twice a week on Sundays and Wednesdays CALTRATE 600+D 600-400 MG-UNIT  TABS (CALCIUM CARBONATE-VITAMIN D) take one tablet two times a day MIRALAX   POWD (POLYETHYLENE GLYCOL 3350) 1 capful at bedtime BENICAR 40 MG TABS (OLMESARTAN MEDOXOMIL) 1 by mouth once daily [facility] SALINE NASAL SPRAY 0.65 %  SOLN (SALINE) 2 puffs every 4 hours as needed NITROQUICK 0.4 MG  SUBL (NITROGLYCERIN) 1 under tongue every 5 minutes x 3 as needed CHLOR-TRIMETON 4 MG TABS (CHLORPHENIRAMINE MALEATE) 1 every 4hrs as needed DELSYM 30 MG/5ML  LQCR (DEXTROMETHORPHAN POLISTIREX) 2 tsp every 12 hours as needed VICODIN 5-500 MG  TABS (HYDROCODONE-ACETAMINOPHEN) Take 1 tablet by mouth every 4 hours as needed TYLENOL EXTRA STRENGTH 500 MG TABS (ACETAMINOPHEN) per bottle  Current Allergies (reviewed today): ! PREDNISONE ! * STATINS !  DOXYCYCLINE  Past Medical History:    COUGH (ICD-786.2)       -  onset 2003, pos response to acid reflux rx,         -  minimal sinus dz by ct 02/08/2006    HYSTERECTOMY, HX OF (ICD-V45.77)    APPENDECTOMY, HX OF (ICD-V45.79)    CHOLECYSTECTOMY, HX OF (ICD-V45.79)    DEPRESSION (ICD-311)    RHINITIS, ALLERGIC (ICD-477.9)    OSTEOARTHRITIS, MULTI SITES  (ICD-715.98)    OBESITY, NOS (ICD-278.00)    INCONTINENCE, URGE (ICD-788.31)    HYPERTENSION, BENIGN SYSTEMIC (ICD-401.1)    HYPERLIPIDEMIA (ICD-272.4)    GASTROESOPHAGEAL REFLUX, NO ESOPHAGITIS (ICD-530.81)    DIVERTICULOSIS OF COLON, NOS (ICD-562.10)    CORONARY, ARTERIOSCLEROSIS (ICD-414.00)    COLON POLYP (ICD-211.3), hyperplastic, 08/2004    ASTHMA, UNSPECIFIED (ICD-493.90)    COMPLEX MEDICAL REGIMEN       - Med calendar 09/26/07, Meds reviewed with pt education and computerized med calendar completed/adjusted July 29, 2008             constipation    abdominal fullness  Past Surgical History:    Reviewed history from 09/15/2006 and no changes required:       Appendectomy - 07/19/1938, Cardiac Cath (normal) - 11/08/2005, Cardiolite (no ischemia) - 01/16/2005, colonoscopy- colon polyps, diverticulosis - 08/19/2004, Echo (nl LVEF) - 05/19/2005, UEA:[id number] polyps benign removed - 03/19/2005, heart cath , no stents - 10/18/2003, hysterectomy - 07/20/1983, lap chole - 07/19/1997, Tonsillectomy - 07/19/1938, total knee replacement left - 07/19/2001  Family History:    Reviewed history from 09/15/2006 and no changes required:       Brother died at 15, Father died at 40, heart dz, Mother died at 25 - heart dz, RA, HTN, Sisters 46- living with RA, MI, Sisters 36- living with heart dz, SLE, HTN       No FH of Colon Cancer:       Family History of Colitis/Crohn's: Sister (colitis)       Family History of Colon Polyps: Mother, sister x 2, neice  Social History:    Reviewed history from 10/25/2007 and no changes required:       Never Married, son in [place].         Never smoked       Denies ETOH.         Able to do ADLs.  Ambulates with cane.  Attends church.  Unable to exercise due to knee pain. 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   Review of Systems       The patient complains of allergy/sinus, arthritis/joint pain, back pain, blood in urine, change in vision, cough, fatigue,  headaches-new, itching, muscle pains/cramps, night sweats, shortness of breath, skin rash, sleeping problems, swelling of feet/legs, urination - excessive, urine leakage, vision changes, and voice change.         The pertinent positives and negatives are noted as above and in the HPI. All other ROS were negative.    Vital Signs:  Patient Profile:   77 Years Old Female Height:     63 inches Weight:      183 pounds BMI:     32.53 BSA:     1.86 Pulse rate:   68 / minute Pulse rhythm:   regular BP sitting:   112 / 76  (right arm)  Vitals Entered By: Hortense Ramal CMA (August 08, 2008 8:33 AM)  Physical Exam  General:     Well developed, well nourished, no acute distress. Head:     Normocephalic and atraumatic. Eyes:     PERRLA, no icterus. Ears:     Normal auditory acuity. Mouth:     No deformity or lesions, dentition normal. Lungs:     Clear throughout to auscultation. Heart:     Regular rate and rhythm; no murmurs, rubs,  or bruits. Abdomen:     Soft, nontender and nondistended. No masses, hepatosplenomegaly or hernias noted. Normal bowel sounds. Msk:     Symmetrical with no gross deformities. Normal posture. Pulses:     Normal pulses noted. Extremities:     No clubbing, cyanosis, edema or deformities noted. Neurologic:     Alert and  oriented x4;  grossly normal neurologically. Skin:     Intact without significant lesions or rashes. Cervical Nodes:     No significant cervical adenopathy. Axillary Nodes:     No significant axillary adenopathy. Inguinal Nodes:     No significant inguinal adenopathy. Psych:     Alert and cooperative. anxious and poor concentration.     Impression & Recommendations:  Problem # 1:  CONSTIPATION (ICD-564.00) Mild constipation well-controlled on current regimen. Her vague lower abdominal fullness may be related to weight gain and/or anxiety. There is no evidence of any significant underlying abdominal process  on CT scan. If her Hemoccults are positive plan to proceed with colonoscopy for further evaluation. She is advised to followup with Dr. Clelia Croft for ongoing care and to address her multiple complaints in the review of systems and anxiety. Orders: TLB-CMP (Comprehensive Metabolic Pnl) (80053-COMP) TLB-CBC Platelet - w/Differential (85025-CBCD) TLB-Lipase (83690-LIPASE) [facility] Hemoccult Cards #3 (take home) (Hem cards #3)   Problem # 2:  GASTROESOPHAGEAL REFLUX, NO ESOPHAGITIS (ICD-530.81) Continue Zegerid 40 mg twice daily and standard antireflux measures. TUMS or Mylanta as needed for breakthrough reflux symptoms.    Patient Instructions: 1)  Get your labs drawn today in the basement. 2)  Follow instructions on Hemoccult cards and mail back to [place] in 2 weeks.  3)  Please schedule a follow-up appointment as needed. 4)  Copy Sent To: Eric Form, MD

## 2010-08-20 NOTE — Progress Notes (Signed)
Summary: ? re lab results   Phone Note Call from Patient Call back at Home Phone [phone]   Caller: Patient Call For: Linsey Arteaga Reason for Call: Talk to Nurse Summary of Call: Patient has questions regarding previous blood work results Initial call taken by: Tawni Levy,  August 21, 2008 12:21 PM  Follow-up for Phone Call        answered questions about lab work.  She wanted specific numbers.  All questions answered. Follow-up by: Darcey Nora RN,  August 21, 2008 1:35 PM

## 2010-08-20 NOTE — Consult Note (Signed)
Summary: [facility]  [employer]   Imported By: Maryln Gottron 07/25/2007 16:04:08  _____________________________________________________________________  External Attachment:    Type:   Image     Comment:   External Document

## 2010-08-20 NOTE — Assessment & Plan Note (Signed)
Summary: med cal/apc   PCP:  Eric Form  Chief Complaint:  med calendar.  History of Present Illness: 2 week follow up   77 year old AAF with a history of cough that dates back to 2004  that inconsistently responds to treatment directed at acid reflux. Last visit her reflux prevention regimen and medication list was reviewed with patient.  She was continued on aggressive reflux prevention with prevacid 30 mg twice daily, reglan, pepcid at bedtime. Atenolol was changed over to Bystolic.   Last visit with almost complete resolution of cough, was given prednisone taper and Chlorpheniramine was added. Returns today with 100% resolution of cough. Unfortunatley, insurance would not cover Chlorpheniramine. Using zyrtec instead. Denies chest pain, dyspnea, orthopnea, hemoptysis, fever, n/v/d, edema. Brought all meds to review. Correct with med list. Norvasc recently added back by primary MD last week.      Prior Medication List:  KLOR-CON M20 20 MEQ  TBCR (POTASSIUM CHLORIDE CRYS CR) Take 1 tablet by mouth once a day BAYER ASPIRIN 325 MG  TABS (ASPIRIN) Take 1 tablet by mouth once a day PEPCID AC 10 MG  TABS (FAMOTIDINE) Take 1 tab by mouth at bedtime PREVACID 30 MG  CPDR (LANSOPRAZOLE) take one tablet two times a day METOCLOPRAMIDE HCL 10 MG  TABS (METOCLOPRAMIDE HCL) Take 1 tablet by mouth four times a day HYDROCHLOROTHIAZIDE 25 MG  TABS (HYDROCHLOROTHIAZIDE) Take 1 tablet by mouth once a day BYSTOLIC 10 MG  TABS (NEBIVOLOL HCL) Take 1 tablet by mouth every morning SYNTHROID 100 MCG  TABS (LEVOTHYROXINE SODIUM) Take 1 tablet by mouth once a day PLAVIX 75 MG  TABS (CLOPIDOGREL BISULFATE) Take 1 tablet by mouth once a day CITRUCEL 500 MG  TABS (METHYLCELLULOSE (LAXATIVE)) Take 1 tablet by mouth once a day TRAZODONE HCL 100 MG  TABS (TRAZODONE HCL) take one tablet at bedtime VITAMIN D [address] UNIT  CAPS (ERGOCALCIFEROL) Take 1 tablet twice a week CALTRATE 600+D 600-400 MG-UNIT  TABS (CALCIUM  CARBONATE-VITAMIN D) take one tablet two times a day MIRALAX   POWD (POLYETHYLENE GLYCOL 3350) daily ED-CHLOR-TAN 8 MG  TABS (CHLORPHENIRAMINE TANNATE) one twice daily [facility] SALINE NASAL SPRAY 0.65 %  SOLN (SALINE) 2 puffs every 4 hours as needed NITROQUICK 0.4 MG  SUBL (NITROGLYCERIN) 1 under tongue every 5 minutes x 3 as needed DELSYM 30 MG/5ML  LQCR (DEXTROMETHORPHAN POLISTIREX) 2 tsp every 12 hours as needed VICODIN 5-500 MG  TABS (HYDROCODONE-ACETAMINOPHEN) Take 1 tablet by mouth every 4 hours as needed PREDNISONE 10 MG  TABS (PREDNISONE) 4 each am x 2days, 2x2days, 1x2days and stop   Current Allergies (reviewed today): ! PREDNISONE ! * STATINS ! DOXYCYCLINE  Past Medical History:    Reviewed history from 09/12/2007 and no changes required:              COUGH (ICD-786.2) onset 2003, pos response to acid reflux rx, minimal sinus dz by ct 02/08/2006       HYSTERECTOMY, HX OF (ICD-V45.77)       APPENDECTOMY, HX OF (ICD-V45.79)       CHOLECYSTECTOMY, HX OF (ICD-V45.79)       DEPRESSION (ICD-311)       RHINITIS, ALLERGIC (ICD-477.9)       OSTEOARTHRITIS, MULTI SITES (ICD-715.98)       OBESITY, NOS (ICD-278.00)       INCONTINENCE, URGE (ICD-788.31)       HYPERTENSION, BENIGN SYSTEMIC (ICD-401.1)       HYPERLIPIDEMIA (ICD-272.4)       GASTROESOPHAGEAL  REFLUX, NO ESOPHAGITIS (ICD-530.81)       DIVERTICULITIS OF COLON, NOS (ICD-562.11)       CORONARY, ARTERIOSCLEROSIS (ICD-414.00)       COLON POLYP (ICD-211.3)       ASTHMA, UNSPECIFIED (ICD-493.90)           Family History:    Reviewed history from 09/15/2006 and no changes required:       Brother died at 61, Father died at 45, heart dz, Mother died at 95 - heart dz, RA, HTN, Sisters 31- living with RA, MI, Sisters 37- living with heart dz, SLE, HTN  Social History:    Reviewed history from 09/15/2006 and no changes required:       Never Married, son in [place].  Never smoked.  Denies ETOH.  Able to do ADLs.  Ambulates with  cane.  Attends church.  Unable to exercise due to knee pain. 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   Risk Factors: Tobacco use:  never  Colonoscopy History:    Date of Last Colonoscopy:  08/19/2004  Mammogram History:    Date of Last Mammogram:  08/20/2003   Review of Systems      See HPI   Vital Signs:  Patient Profile:   77 Years Old Female Height:     63 inches Weight:      179.50 pounds O2 Sat:      96 % O2 treatment:    Room Air Temp:     96.8 degrees F oral Pulse rate:   63 / minute BP sitting:   124 / 90  (left arm) Cuff size:   regular  Vitals Entered By: Boone Master CNA (September 26, 2007 9:01 AM)             Is Patient Diabetic? No Comments Medications reviewed with patient ..................................................................Marland KitchenBoone Master CNA  September 26, 2007 9:01 AM      Physical Exam  No acute distress. VSS.  HEENT: unremarkable. Oropharynx is clear. TM nml, EAC clear Neck: supple, no adenopathy, or JVD Lungs: CTA bilat. no wheezing/crackles Card: r/r/r, no m/r/g Abd: soft, nontender Ext: warm w/o calf tenderness, cyanosis clubbing, or tr. edema.       Impression & Recommendations:  Problem # 1:  COUGH (ICD-786.2) Resolved with GERD and Rhinitis prevention. Reinforced prevention of cough techniques.  Meds reviewed with pt. ed given. Computerized calendar adjusted. follow up 4 weeks Dr. Sherene Sires and as needed  Orders: Est. Patient Level III (40981)   Medications Added to Medication List This Visit: 1)  Bayer Aspirin Ec Low Dose 81 Mg Tbec (Aspirin) .... Take 1 tablet by mouth once a day 2)  Citrucel Powd (Methylcellulose (laxative)) .Marland Kitchen.. 1 tbsp every morning 3)  Vitamin D 19147 Unit Caps (Ergocalciferol) .... Take 1 tablet twice a week on sundays and wednesdays 4)  Miralax Powd (Polyethylene glycol 3350) .Marland Kitchen.. 1 capful at bedtime 5)  Amlodipine Besylate 5 Mg Tabs (Amlodipine besylate) .... Take 1 tablet by mouth once  a day 6)  Zyrtec Allergy 10 Mg Tabs (Cetirizine hcl) .Marland Kitchen.. 1 daily as needed   Patient Instructions: 1)  COUGH PREVENTION 2)  1. ADD ZYRTEC --FOR NASAL DRAINAGE/DRIP 3)  2. ADD DELSYM --FOR COUGH 4)  3. ADD HYDROCODONE FOR BREAKTHROUGH COUGH 5)  4. ADD SALINE NASAL- FOR STUFFY NOSE.  6)  FOLLOW MED SHEET FOR DIRECTIONS/BRING TO EACH VISIT 7)  SUGARLESS CANDY TO AVOID THROAT CLEARING/DRY THROAT-INCREASE WATER 8)  NO MINTS 9)  follow up 4 weeks Dr. Sherene Sires     ]

## 2010-08-20 NOTE — Assessment & Plan Note (Signed)
Summary: 1 month/apc   PCP:  Eric Form  Chief Complaint:  1 month follow-up.  History of Present Illness: 77 year old AAF with a history of cough that dates back to 2004  that inconsistently responds to treatment directed at acid reflux. Last visit her reflux prevention regimen and medication list was reviewed with patient.  She was continued on aggressive reflux prevention with prevacid 30 mg twice daily, reglan, pepcid at bedtime. Atenolol was changed over to Bystolic. She was given a medrol dose pack and recommended on cough suppression regimen Since last visit cough much better, 95 % better, after finished medrol when seen here by NP on 1/22, then sev weeks later while maintaining max GERD rx  and then started back on Delsym and then vicodin. No sputum production or diurnal patter.  Pt denies any significant sore throat, nasal congestion or excess secretions, fever, chills, sweats, unintended wt loss, pleuritic or exertional cp, orthopnea pnd or leg swelling.  Pt also denies any obvious fluctuation in symptoms with weather or environmental change or other alleviating or aggravating factors.         Current Allergies: ! PREDNISONE ! * STATINS ! DOXYCYCLINE  Past Medical History:    Reviewed history from 08/10/2007 and no changes required:              COUGH (ICD-786.2) onset 2003, pos response to acid reflux rx, minimal sinus dz by ct 02/08/2006       HYSTERECTOMY, HX OF (ICD-V45.77)       APPENDECTOMY, HX OF (ICD-V45.79)       CHOLECYSTECTOMY, HX OF (ICD-V45.79)       DEPRESSION (ICD-311)       RHINITIS, ALLERGIC (ICD-477.9)       OSTEOARTHRITIS, MULTI SITES (ICD-715.98)       OBESITY, NOS (ICD-278.00)       INCONTINENCE, URGE (ICD-788.31)       HYPERTENSION, BENIGN SYSTEMIC (ICD-401.1)       HYPERLIPIDEMIA (ICD-272.4)       GASTROESOPHAGEAL REFLUX, NO ESOPHAGITIS (ICD-530.81)       DIVERTICULITIS OF COLON, NOS (ICD-562.11)       CORONARY, ARTERIOSCLEROSIS (ICD-414.00)  COLON POLYP (ICD-211.3)       ASTHMA, UNSPECIFIED (ICD-493.90)           Family History:    Reviewed history from 09/15/2006 and no changes required:       Brother died at 91, Father died at 41, heart dz, Mother died at 76 - heart dz, RA, HTN, Sisters 70- living with RA, MI, Sisters 17- living with heart dz, SLE, HTN  Social History:    Reviewed history from 09/15/2006 and no changes required:       Never Married, son in [place].  Never smoked.  Denies ETOH.  Able to do ADLs.  Ambulates with cane.  Attends church.  Unable to exercise due to knee pain. 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    Review of Systems  The patient denies anorexia, fever, weight loss, weight gain, vision loss, decreased hearing, chest pain, syncope, dyspnea on exhertion, peripheral edema, hemoptysis, abdominal pain, melena, hematochezia, severe indigestion/heartburn, hematuria, incontinence, muscle weakness, suspicious skin lesions, transient blindness, difficulty walking, depression, unusual weight change, abnormal bleeding, enlarged lymph nodes, and angioedema.     Vital Signs:  Patient Profile:   77 Years Old Female Height:     63 inches Weight:      177 pounds O2 Sat:      96 %  O2 treatment:    Room Air Temp:     97.8 degrees F oral Pulse rate:   75 / minute BP sitting:   126 / 82  (left arm)  Vitals Entered By: Michel Bickers CMA (September 12, 2007 9:58 AM)             Comments Pt c/o cough with clear/white phlegm. Medications reviewed.     Physical Exam  Animated hoarse bf in nad Afeb with normal vital signs HEENT: nl dentition, turbinates, and orophanx. Nl external ear canals without cough reflex Neck without JVD/Nodes/TM Lungs clear to A and P bilaterally without cough on insp or exp maneuvers RRR no s3 or murmur or increase in P2 Abd soft and benign with nl excursion in the supine position. No bruits or organomegaly Ext warm without calf tenderness, cyanosis clubbing or  edema Skin warm and dry without lesions       Impression & Recommendations:  Problem # 1:  COUGH (ICD-786.2) I had an extended discussion with the patient today lasting 15 to 20 minutes of a 25 minute visit on the following issues:   Of the three most common causes of chronic cough, only one can actually cause the other two and perpetuate the cylce of cough inducing airway trauma, inflammation, heightened sensitivity to reflux which is prompted by the cough itself via a cyclical mechanism.  This may partially respond to steroids and look like asthma and post nasal drainage but never erradicated completely unless the cough and the secondary reflux are eliminated, preferably both at the same time.  In her case, even heavy suppression of acid and non acid reflux has not eliminated her recurrent cough, although the severe cyclical component is better and she does appear to have a steroid responsive component suggesting either primary (ie not secondary to reflux) pnds vs asthma.  Try adding clorpheniramine and one more prednisone burst.  If this is ineffective, needs trial of low dose Advair 100/50 two times a day for two weeks vs MCT for definitive ro of asthma.   Each maintenance medication was reviewed in detail including most importantly the difference between maintenance and as needed and under what circumstances the prns are to be used.  She's doing better with med reconciliation but we still need to continue a trust but verify approach with each ov. Orders: Est. Patient Level IV (04540)   Medications Added to Medication List This Visit: 1)  Ed-chlor-tan 8 Mg Tabs (Chlorpheniramine tannate) .... One twice daily 2)  Prednisone 10 Mg Tabs (Prednisone) .... 4 each am x 2days, 2x2days, 1x2days and stop   Patient Instructions: 1)  See calendar for specific medication instructions and bring it back for each and every office visit for every healthcare provider you see.  Without it,  you may not  receive the best quality medical care that we feel you deserve.  2)  Please schedule a follow-up appointment in 2 weeks with all your medications and your calendar in two separate bags    Prescriptions: VICODIN 5-500 MG  TABS (HYDROCODONE-ACETAMINOPHEN) Take 1 tablet by mouth every 4 hours as needed  #40 x 0   Entered and Authorized by:   Nyoka Cowden MD   Signed by:   Nyoka Cowden MD on 09/12/2007   Method used:   Electronically sent to ...       [facility]       [address]       [place],  [place]  [address]       Ph: [phone]       Fax: [phone]   RxID:   [id number] PREDNISONE 10 MG  TABS (PREDNISONE) 4 each am x 2days, 2x2days, 1x2days and stop  #14 x 0   Entered and Authorized by:   Nyoka Cowden MD   Signed by:   Nyoka Cowden MD on 09/12/2007   Method used:   Electronically sent to ...       [facility]       [address]       [place], [place]  [id number]       Ph: [phone]       Fax: [phone]   RxID:   [id number] ED-CHLOR-TAN 8 MG  TABS (CHLORPHENIRAMINE TANNATE) one twice daily  #60 x 11   Entered and Authorized by:   Nyoka Cowden MD   Signed by:   Nyoka Cowden MD on 09/12/2007   Method used:   Electronically sent to ...       [facility]       [address]       [place], [place]  [address]       Ph: [phone]       Fax: [phone]   RxID:   [id number]  ]

## 2010-08-20 NOTE — Assessment & Plan Note (Signed)
Summary: FU 2 WKS///KWP   Visit Type:  Follow-up PCP:  Eric Form  Chief Complaint:  Pt here for rov.  She has no complaints today.Marland Kitchen  History of Present Illness: 77 year old black man female who initially said she had no complaints today but comes in with a day more than night dry cough that dates back to 2004  that inconsistently responds to treatment directed at acid reflux.  I last saw her in December with a report of a severe cough for 4 months worse during the day than night and documented inconsistent use of medications that were intended for maintenance purposes.  For that reason I brought her back to her nurse practitioner for a full medication reconciliation which seems to help her organize her medicines but did not provide any real insight to the patient regarding the names of her medications, so she struggles correlating with on the calendar with what she is after taking.  Pt denies any significant sore throat, nasal congestion or excess secretions, fever, chills, sweats, unintended wt loss, pleuritic or exertional cp, orthopnea pnd or leg swelling.   Pt also denies any obvious fluctuation in symptoms with weather or environmental change or other alleviating or aggravating factors.  She reports the only thing that makes it better his Vicodin and she would like some more.   Current Allergies: ! PREDNISONE ! * STATINS ! DOXYCYCLINE  Past Medical History:     bilateral cataracts, chronic cough, ?asthma variant, eczema, hx of depression, MI/ 10/2003 (very small inferoapical), mild mitral and tricuasic regurgitation(Echo 11/06, mild to mod LVH w/diastolic dysfunction(Echo11/06   Family History:    Reviewed history from 09/15/2006 and no changes required:       Brother died at 77, Father died at 64, heart dz, Mother died at 49 - heart dz, RA, HTN, Sisters 87- living with RA, MI, Sisters 4- living with heart dz, SLE, HTN  Social History:    Reviewed history from 09/15/2006 and no  changes required:       Never Married, son in [place].  Never smoked.  Denies ETOH.  Able to do ADLs.  Ambulates with cane.  Attends church.  Unable to exercise due to knee pain. 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     Vital Signs:  Patient Profile:   77 Years Old Female Height:     63 inches Weight:      172.0 pounds O2 Sat:      97 % O2 treatment:    Room Air Temp:     98.3 degrees F oral Pulse rate:   58 / minute BP sitting:   140 / 80  (left arm)  Vitals Entered By: Vernie Murders (July 27, 2007 10:53 AM)                 Physical Exam  in general, she is a very animated black  female in no acute distress talking a mile a minutewith classic voice fatigue.Phineas Semen with normal vital signs HEENT: nl dentition, turbinates, and orophanx. Nl external ear canals without cough reflex Neck without JVD/Nodes/TM Lungs clear to A and P bilaterally without cough on insp or exp maneuvers RRR no s3 or murmur or increase in P2 Abd soft and benign with nl excursion in the supine position. No bruits or organomegaly Ext warm without calf tenderness, cyanosis clubbing or edema Skin warm and dry without lesions       Impression & Recommendations:  Problem # 1:  COUGH (ICD-786.2) I had an extended discussion with the patient today lasting 15 to 20 minutes of a 25 minute visit on the following issues:    She has done the best  when being treated aggressively for both rhinitis and reflux.  However, I don't believe she's ever had a methacholine challenge test, which certainly could be considered once unconvinced that she's addressed rhinitis and reflux adequately.  Unfortunately, she continues to struggle quite a bit with the concept of medication reconciliation and correlating the very user-friendly and an ambiguous medication calendar that she was provided with what she is actually taking at home.   Each maintenance medication was reviewed in detail including most  importantly the difference between maintenance and as needed and under what circumstances the prns are to be used.   For now, I added Pepcid 20 mg a.c. at bedtime and asked her to stop the Tums at bedtime which may epigastric pressure.  I recommended that she start on Medrol 16 mg until better 8 mg for 3 days then 4 mg for 3 days and off.  If this fails control cough, the next logical step would be to proceed with methacholine challenge test while being treated aggressively for reflux. Orders: Est. Patient Level IV ([address])   Medications Added to Medication List This Visit: 1)  Bystolic 10 Mg Tabs (nebivolol Hcl)  .... Take 1 tablet by mouth once a day 2)  Tylenol Extra Strength 500 Mg Tabs (Acetaminophen) .... Per bottle as needed 3)  Medrol 4 Mg Tabs (Methylprednisolone) .... 4 qam until better, then 2 qam x 3 days, then 1 qam x 3 days and stop   Patient Instructions: 1)  GERD (gastroesophageal reflux disease) was discussed. It is a common cause of respiratory symptoms. It commonly presents in the absence of heartburn. GERD can be treated with medication, but also with lifestyle changes including avoidance of late meals, excessive alcohol, smoking cessation, and avoidance of foods and drinks including those that the person notes to cause symptoms, fatty foods, chocolate, peppermint, colas, red wine, and acidic juices such as orange juice. 2)  Please schedule a follow-up appointment in 2 weeks.    Prescriptions: MEDROL 4 MG  TABS (METHYLPREDNISOLONE) 4 QAM until better, then 2 qam x 3 days, then 1 qam x 3 days and stop  #60 x 0   Entered and Authorized by:   Nyoka Cowden MD   Signed by:   Nyoka Cowden MD on 07/27/2007   Method used:   Print then Give to Patient   RxID:   [id number] VICODIN 5-500 MG  TABS (HYDROCODONE-ACETAMINOPHEN) Take 1 tablet by mouth every 4 hours as needed  #40 x 0   Entered and Authorized by:   Nyoka Cowden MD   Signed by:   Nyoka Cowden MD on  07/27/2007   Method used:   Print then Give to Patient   RxID:   [id number]  ]

## 2010-08-20 NOTE — Miscellaneous (Signed)
Summary: meds update  Medications Added METOCLOPRAMIDE HCL 10 MG  TABS (METOCLOPRAMIDE HCL) 1 pill 4 times a day HYDROCHLOROTHIAZIDE 25 MG  TABS (HYDROCHLOROTHIAZIDE) 1 pill a day CITRUCEL   POWD (METHYLCELLULOSE (LAXATIVE)) 1 tbsp every morning and 1 tbsp at bedtime CHLOR-TRIMETON 4 MG TABS (CHLORPHENIRAMINE MALEATE) 1 every 4hrs as needed       Clinical Lists Changes  Medications: Changed medication from METOCLOPRAMIDE HCL 10 MG  TABS (METOCLOPRAMIDE HCL) ONE HALF  before each meal and at bedtime to METOCLOPRAMIDE HCL 10 MG  TABS (METOCLOPRAMIDE HCL) 1 pill 4 times a day - Signed Changed medication from HYDROCHLOROTHIAZIDE 25 MG  TABS (HYDROCHLOROTHIAZIDE) Take 1/2 tablet once a day to HYDROCHLOROTHIAZIDE 25 MG  TABS (HYDROCHLOROTHIAZIDE) 1 pill a day - Signed Changed medication from CITRUCEL   POWD (METHYLCELLULOSE (LAXATIVE)) 1 tbsp every morning to CITRUCEL   POWD (METHYLCELLULOSE (LAXATIVE)) 1 tbsp every morning and 1 tbsp at bedtime - Signed Changed medication from ED-CHLOR-TAN 8 MG  TABS (CHLORPHENIRAMINE TANNATE) one twice daily to CHLOR-TRIMETON 4 MG TABS (CHLORPHENIRAMINE MALEATE) 1 every 4hrs as needed - Signed

## 2010-08-20 NOTE — Miscellaneous (Signed)
Summary: Benicar not covered, changed to Diovan  Medications Added DIOVAN 80 MG TABS (VALSARTAN) Take 1 tablet by mouth once a day       Clinical Lists Changes  Received a Prior Authorization for pt's Benicar 40mg .  Per [facility], alternatives include Micardis 40mg , and Diovan 40/80/160/320mg .  Per TP, may change to Diovan 80mg , 1 by mouth once daily number 30 with 6 refills.  Called this change in to the pharmacist, Roseanne Reno, at [facility]  Will change med in patient's chart. Boone Master CNA  October 11, 2008 5:45 PM  Medications: Changed medication from BENICAR 40 MG TABS (OLMESARTAN MEDOXOMIL) 1 by mouth once daily to DIOVAN 80 MG TABS (VALSARTAN) Take 1 tablet by mouth once a day - Signed Rx of DIOVAN 80 MG TABS (VALSARTAN) Take 1 tablet by mouth once a day;  #30 x 6;  Signed;  Entered by: Boone Master CNA;  Authorized by: Tammy Parrett NP;  Method used: Telephoned to [facility] [address] [place], [place]  [address], Ph: [phone], Fax: [phone]    Prescriptions: DIOVAN 80 MG TABS (VALSARTAN) Take 1 tablet by mouth once a day  #30 x 6   Entered by:   Boone Master CNA   Authorized by:   Rubye Oaks NP   Signed by:   Boone Master CNA on 10/11/2008   Method used:   Telephoned to ...       [facility] (retail)       [address]       [place], [place]  [address]       Ph: [phone]       Fax: [phone]   RxID:   [id number]

## 2010-08-20 NOTE — Op Note (Signed)
Summary: Colonoscopy and polypectomy  NAME:  [name], [name]                ACCOUNT NO.:  o   MEDICAL RECORD NO.:  [id number]          PATIENT TYPE:  AMB   LOCATION:  ENDO                         FACILITY:  [facility]   PHYSICIAN:  Danise Edge, M.D.   DATE OF BIRTH:  12-22-1931   DATE OF PROCEDURE:  09/07/2004  DATE OF DISCHARGE:                                 OPERATIVE REPORT   PROCEDURE:  Colonoscopy and polypectomy.   INDICATIONS FOR PROCEDURE:  Ms. [name] is a 77 year old female  born 09/03/1931.  Ms. Charlot underwent a colonoscopy in 2001;  hyperplastic polyps were removed from the colon.  Ms. Riggenbach has colonic  diverticulosis.   ENDOSCOPIST:  Danise Edge, M.D.   PREMEDICATION:  Versed 2.5 mg, Demerol 50 mg.   DESCRIPTION OF PROCEDURE:  After obtaining informed consent, [name] was  placed in the left lateral decubitus position. I administered intravenous  Demerol and intravenous Versed to achieve conscious sedation for the  procedure. The patient's blood pressure, oxygen saturation and cardiac  rhythm were monitored throughout the procedure and documented in the medical  record.   Anal inspection and digital rectal exam were normal. The Olympus adjustable  pediatric colonoscope was introduced into the rectum and advanced to the  cecum. Colonic preparation for the exam today was excellent.   RECTUM:  Normal.   SIGMOID COLON AND DESCENDING COLON:  From the distal sigmoid colon at 25 cm  from the anal verge, a 1 mm sessile polyp was removed with the cold biopsy  forceps.  Left colonic diverticulosis present.   SPLENIC FLEXURE:  Normal.   TRANSVERSE COLON:  Normal.   HEPATIC FLEXURE:  Normal.   ASCENDING COLON:  Normal.   CECUM AND ILEOCECAL VALVE:  Normal.   ASSESSMENT:  A diminutive polyp was removed from the distal sigmoid colon.  Left colonic diverticulosis present.      MJ/MEDQ  D:  09/07/2004  T:  09/07/2004  Job:  [id number]   cc:    Georgann Housekeeper, MD  [address]  [place]  [place] [address]  Fax: [phone]         SP Surgical Pathology - STATUS: Final             By: SMIR MD , Jessica Priest           Perform Date: 20Feb06 00:00  Ordered By: Laural Benes MD , Jone Baseman         Ordered Date: 20Feb06 13:06  Facility: [facility]                              Department: CPATH  Service Report Text  [facility]   [address] [place], [place] [address]   [phone]    REPORT OF SURGICAL PATHOLOGY    Case #: [id number]   Patient Name: [name], [name]   PID: [id number]   Pathologist: Havery Moros, MD   DOB/Age [birth year] (Age: 64) Gender: F   Date  Taken: 09/07/2004   Date Received: 09/07/2004    FINAL DIAGNOSIS    ***MICROSCOPIC EXAMINATION AND DIAGNOSIS***    COLON AT 25 CM, POLYP(S): HYPERPLASTIC POLYP(S). NO ADENOMATOUS   CHANGE OR MALIGNANCY IDENTIFIED.    kv   Date Reported: 09/08/2004 Havery Moros, MD   *** Electronically Signed Out By BNS ***    Clinical information   R/O neoplasia (cm)    specimen(s) obtained   Colon, polyp(s), @ 25 cm    Gross Description   Received in formalin is a tan, soft tissue fragment that is   submitted in toto. Size: 0.2 cm SW:mw 09/07/04    mw/

## 2010-08-20 NOTE — Assessment & Plan Note (Signed)
Summary: 4 weeks/apc   Visit Type:  Follow-up PCP:  Eric Form  Chief Complaint:  Pt returns for a 4 wk followup on cough.  She states her cough is much better.  She has no complaints today.Marland Kitchen  History of Present Illness:  77 year old AAF  never smoker with a history of cough that dates back to 2004  that inconsistently responds to treatment directed at acid reflux. Last visit her reflux prevention regimen and medication list was reviewed with patient.  She was continued on aggressive reflux prevention with prevacid 30 mg twice daily, reglan, pepcid at bedtime. Atenolol was changed over to Bystolic.    Last visit , was given prednisone rapid taper and Chlorpheniramine was added. Returns today with 100% resolution of cough. Now on zyrtec instead due to insurance and having moderate urge to clear throat but denies nocturnal cough   October 25, 2007 Current Meds: Maint only KLOR-CON M20 20 MEQ  TBCR (POTASSIUM CHLORIDE CRYS CR) Take 1 tablet by mouth once a day BAYER ASPIRIN EC LOW DOSE 81 MG  TBEC (ASPIRIN) Take 1 tablet by mouth once a day PEPCID AC 10 MG  TABS (FAMOTIDINE) Take 1 tab by mouth at bedtime PREVACID 30 MG  CPDR (LANSOPRAZOLE) take one tablet two times a day METOCLOPRAMIDE HCL 10 MG  TABS (METOCLOPRAMIDE HCL) Take 1 tablet by mouth four times a day HYDROCHLOROTHIAZIDE 25 MG  TABS (HYDROCHLOROTHIAZIDE) Take 1 tablet by mouth once a day BYSTOLIC 10 MG  TABS (NEBIVOLOL HCL) Take 1 tablet by mouth every morning SYNTHROID 100 MCG  TABS (LEVOTHYROXINE SODIUM) Take 1 tablet by mouth once a day PLAVIX 75 MG  TABS (CLOPIDOGREL BISULFATE) Take 1 tablet by mouth once a day CITRUCEL   POWD (METHYLCELLULOSE (LAXATIVE)) 1 tbsp every morning TRAZODONE HCL 100 MG  TABS (TRAZODONE HCL) take one tablet at bedtime VITAMIN D 81191 UNIT  CAPS (ERGOCALCIFEROL) Take 1 tablet twice a week on Sundays and Wednesdays CALTRATE 600+D 600-400 MG-UNIT  TABS (CALCIUM CARBONATE-VITAMIN D) take one tablet two  times a day MIRALAX   POWD (POLYETHYLENE GLYCOL 3350) 1 capful at bedtime AMLODIPINE BESYLATE 5 MG  TABS (AMLODIPINE BESYLATE) Take 1 tablet by mouth once a day      Current Allergies: ! PREDNISONE ! * STATINS ! DOXYCYCLINE  Past Medical History:    Reviewed history from 09/12/2007 and no changes required:              COUGH (ICD-786.2) onset 2003, pos response to acid reflux rx, minimal sinus dz by ct 02/08/2006       HYSTERECTOMY, HX OF (ICD-V45.77)       APPENDECTOMY, HX OF (ICD-V45.79)       CHOLECYSTECTOMY, HX OF (ICD-V45.79)       DEPRESSION (ICD-311)       RHINITIS, ALLERGIC (ICD-477.9)       OSTEOARTHRITIS, MULTI SITES (ICD-715.98)       OBESITY, NOS (ICD-278.00)       INCONTINENCE, URGE (ICD-788.31)       HYPERTENSION, BENIGN SYSTEMIC (ICD-401.1)       HYPERLIPIDEMIA (ICD-272.4)       GASTROESOPHAGEAL REFLUX, NO ESOPHAGITIS (ICD-530.81)       DIVERTICULITIS OF COLON, NOS (ICD-562.11)       CORONARY, ARTERIOSCLEROSIS (ICD-414.00)       COLON POLYP (ICD-211.3)       ASTHMA, UNSPECIFIED (ICD-493.90)  Family History:    Reviewed history from 09/15/2006 and no changes required:       Brother died at 35, Father died at 78, heart dz, Mother died at 20 - heart dz, RA, HTN, Sisters 65- living with RA, MI, Sisters 6- living with heart dz, SLE, HTN  Social History:    Reviewed history from 09/15/2006 and no changes required:       Never Married, son in [place].         Never smoked       Denies ETOH.         Able to do ADLs.  Ambulates with cane.  Attends church.  Unable to exercise due to knee pain. 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     Vital Signs:  Patient Profile:   77 Years Old Female Height:     63 inches Weight:      180.38 pounds O2 Sat:      99 % O2 treatment:    Room Air Temp:     99.5 degrees F oral Pulse rate:   68 / minute BP sitting:   110 / 66  (left arm)  Vitals Entered By: Vernie Murders (October 25, 2007 1:58 PM)                 Physical Exam  No acute distress. VSS.  HEENT: unremarkable. Oropharynx is clear. TM nml, EAC clear Neck: supple, no adenopathy, or JVD Lungs: CTA bilat. no wheezing/crackles Card: r/r/r, no m/r/g Abd: soft, nontender Ext: warm w/o calf tenderness, cyanosis clubbing, or tr. edema.     CXR  Procedure date:  08/04/2007  Findings:      chest x-ray from [facility] reviewed and is normal    Impression & Recommendations:  Problem # 1:  COUGH (ICD-786.2)  Resolved with GERD and Rhinitis prevention. Reinforced prevention of cough techniques.  Meds reviewed with pt using Computerized calendar adjusted to include otc chlortrimeton if zyrtec not effective at aleviating urge to clear her throat. Orders: Est. Patient Level III ([address])    Patient Instructions: 1)  Please schedule a follow-up appointment in 6  weeks. Marland Kitchen 2)  See calendar for specific medication instructions and bring it back for each and every office visit for every healthcare provider you see.  Without it,  you may not receive the best quality medical care that we feel you deserve.     ]

## 2010-08-20 NOTE — Assessment & Plan Note (Signed)
Summary: rov/apc   Visit Type:  Follow-up PCP:  Eric Form  Chief Complaint:  Pt returns for a 6 wk followup.  She states that her cough is beginning to come back over the past wk.  She states it does not bother her all the time and is prod with white sputum.  She denies any other complaints..  History of Present Illness:  77 year old AAF  never smoker with a history of cough that dates back to 2004  that inconsistently responds to treatment directed at acid reflux. Last visit her reflux prevention regimen and medication list were reviewed with patient.  She was continued on aggressive reflux prevention with prevacid 30 mg twice daily, reglan, pepcid at bedtime. Atenolol was changed over to Bystolic.    Last visit , was given prednisone rapid taper and Chlorpheniramine was added and she feels this does better than anything else when she remembers to take it per her as needed list. Pt denies any significant sore throat, nasal congestion or excess secretions, fever, chills, sweats, unintended wt loss, pleuritic or exertional cp, orthopnea pnd or leg swelling.  Pt also denies any obvious fluctuation in symptoms with weather or environmental change or other alleviating or aggravating factors.     Dec 07, 2007 7:18 PM  Current Meds:        Current Allergies: ! PREDNISONE ! * STATINS ! DOXYCYCLINE  Past Medical History:        COUGH (ICD-786.2) onset 2003, pos response to acid reflux rx, minimal sinus dz by ct 02/08/2006    HYSTERECTOMY, HX OF (ICD-V45.77)    APPENDECTOMY, HX OF (ICD-V45.79)    CHOLECYSTECTOMY, HX OF (ICD-V45.79)    DEPRESSION (ICD-311)    RHINITIS, ALLERGIC (ICD-477.9)    OSTEOARTHRITIS, MULTI SITES (ICD-715.98)    OBESITY, NOS (ICD-278.00)    INCONTINENCE, URGE (ICD-788.31)    HYPERTENSION, BENIGN SYSTEMIC (ICD-401.1)    HYPERLIPIDEMIA (ICD-272.4)    GASTROESOPHAGEAL REFLUX, NO ESOPHAGITIS (ICD-530.81)    DIVERTICULITIS OF COLON, NOS (ICD-562.11)    CORONARY,  ARTERIOSCLEROSIS (ICD-414.00)    COLON POLYP (ICD-211.3)    ASTHMA, UNSPECIFIED (ICD-493.90)       Family History:    Reviewed history from 09/15/2006 and no changes required:       Brother died at 18, Father died at 17, heart dz, Mother died at 90 - heart dz, RA, HTN, Sisters 58- living with RA, MI, Sisters 74- living with heart dz, SLE, HTN  Social History:    Reviewed history from 10/25/2007 and no changes required:       Never Married, son in [place].         Never smoked       Denies ETOH.         Able to do ADLs.  Ambulates with cane.  Attends church.  Unable to exercise due to knee pain. 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     Vital Signs:  Patient Profile:   77 Years Old Female Height:     63 inches Weight:      180.25 pounds O2 Sat:      96 % O2 treatment:    Room Air Temp:     98.9 degrees F oral Pulse rate:   56 / minute BP sitting:   136 / 80  (left arm)  Vitals Entered By: Vernie Murders (Dec 07, 2007 10:02 AM)                 Physical  Exam  In general, animated minimally hoarse bf talking a mile a minute in nad Afeb with normal vital signs HEENT: nl dentition, turbinates, and orophanx. Nl external ear canals without cough reflex Neck without JVD/Nodes/TM Lungs clear to A and P bilaterally without cough on insp or exp maneuvers RRR no s3 or murmur or increase in P2 Abd soft and benign with nl excursion in the supine position. No bruits or organomegaly Ext warm without calf tenderness, cyanosis clubbing or edema Skin warm and dry without lesions       Impression & Recommendations:  Problem # 1:  COUGH (ICD-786.2) Cough again under adequate control on present complex regimen.  It is best characterized as UACS, the new name for post nasal drip syndrome, and likely represents nonspecific rhinitis plus reflux.   Will continue to treat both aggressively.  If she flares again, may want to try her on brovana and budosonide nebs  empirically to rule out occult asthma as she has responded so well to short courses of prednisone in the past.   Each maintenance medication was reviewed in detail including most importantly the difference between maintenance and as needed and under what circumstances the prns are to be used. This was done in the context of a medication calendar review which provided the patient with a user-friendly unambiguous mechanism for medication administration and reconciliation. It is critical that this be shown to every doctor he sees for modification during the office visit if necessary so the patient can use it as a working document.   Orders: Est. Patient Level III ([address])    Patient Instructions: 1)  Please schedule a follow-up appointment in 3 months. 2)  See calendar for specific medication instructions and bring it back for each and every office visit for every healthcare provider you see.  Without it,  you may not receive the best quality medical care that we feel you deserve.    ]

## 2010-08-20 NOTE — Letter (Signed)
Summary: CMA Hemoccult Letter  [facility]  [address] [place], [place] [address]   Phone: [phone]  Fax: [phone]         August 28, 2008 MRN: [id number]    [facility] [place] [address] [place], [place]  [address]    Dear [name],     I have been unable to reach you by phone. At your last visit, Dr.___Stark__ requested that you complete the hemoccult cards given to you at your last visit. However, as of yet, we have not received them. Please follow the instructions on the inside cover and return them as soon as possible.If you have misplaced the hemoccult cards, please call me at [phone] and I will mail you new cards. Your health is very important to [place].These tests will help ensure that Dr. _Stark___has all the information at his disposal to make a complete diagnosis for you.  Thank you for your prompt attention to this matter.   Sincerely,    Darcey Nora RN

## 2010-08-20 NOTE — Assessment & Plan Note (Signed)
Summary: Pulmonary/ flare of cough   PCP:  Eric Form  Chief Complaint:  3 month followup.  Pt c/o sores in her nose x 1 wk.  Also c/o sneezing "all wk long".  She has also had some facial pressure.Marland Kitchen  History of Present Illness: 77 year old AAF  never smoker with a history of cough that dates back to 2004  that inconsistently responds to treatment directed at acid reflux.    Last visit , was given prednisone rapid taper and Chlorpheniramine was added and she feels this does better than anything else when she remembers to take it per her as needed list. Pt denies any significant sore throat, nasal congestion or excess secretions, fever, chills, sweats, unintended wt loss, pleuritic or exertional cp, orthopnea pnd or leg swelling.  Pt also denies any obvious fluctuation in symptoms with weather or environmental change or other alleviating or aggravating factors.    Dec 07, 2007 doing better, no change in rx  March 15, 2008 ov doing  well and note that she is using the medication calendar now consistently which is the first time this is happened and correlates with excellent control of her symptoms.  Oct 30 saw bates with ha and sore throat, dx'd reflux, then worse the following day with sneezing and pain in face.  May 24, 2008 still with lots of itching sneezing, minimal dry cough.         Updated Prior Medication List: KLOR-CON M20 20 MEQ  TBCR (POTASSIUM CHLORIDE CRYS CR) Take 1 tablet by mouth once a day BAYER ASPIRIN EC LOW DOSE 81 MG  TBEC (ASPIRIN) Take 1 tablet by mouth once a day PEPCID AC 10 MG  TABS (FAMOTIDINE) Take 1 tab by mouth at bedtime METOCLOPRAMIDE HCL 10 MG  TABS (METOCLOPRAMIDE HCL) ONE HALF  before each meal and at bedtime HYDROCHLOROTHIAZIDE 25 MG  TABS (HYDROCHLOROTHIAZIDE) Take 1/2 tablet once a day BYSTOLIC 10 MG  TABS (NEBIVOLOL HCL) Take 1 tablet by mouth every morning SYNTHROID 112 MCG TABS (LEVOTHYROXINE SODIUM) 1 by mouth once daily PLAVIX 75 MG   TABS (CLOPIDOGREL BISULFATE) Take 1 tablet by mouth once a day CITRUCEL   POWD (METHYLCELLULOSE (LAXATIVE)) 1 tbsp every morning TRAZODONE HCL 100 MG  TABS (TRAZODONE HCL) take one tablet at bedtime VITAMIN D [address] UNIT  CAPS (ERGOCALCIFEROL) Take 1 tablet twice a week on Sundays and Wednesdays CALTRATE 600+D 600-400 MG-UNIT  TABS (CALCIUM CARBONATE-VITAMIN D) take one tablet two times a day MIRALAX   POWD (POLYETHYLENE GLYCOL 3350) 1 capful at bedtime AMLODIPINE BESYLATE 2.5 MG TABS (AMLODIPINE BESYLATE) 1 once daily ZEGERID 40-1100 MG CAPS (OMEPRAZOLE-SODIUM BICARBONATE) 1 two times a day BENICAR 20 MG TABS (OLMESARTAN MEDOXOMIL) 1 once daily ED-CHLOR-TAN 8 MG  TABS (CHLORPHENIRAMINE TANNATE) one twice daily [facility] SALINE NASAL SPRAY 0.65 %  SOLN (SALINE) 2 puffs every 4 hours as needed NITROQUICK 0.4 MG  SUBL (NITROGLYCERIN) 1 under tongue every 5 minutes x 3 as needed DELSYM 30 MG/5ML  LQCR (DEXTROMETHORPHAN POLISTIREX) 2 tsp every 12 hours as needed VICODIN 5-500 MG  TABS (HYDROCODONE-ACETAMINOPHEN) Take 1 tablet by mouth every 4 hours as needed PREDNISONE 10 MG  TABS (PREDNISONE) 4 each am x 2days, 2x2days, 1x2days and stop  Current Allergies (reviewed today): ! PREDNISONE ! * STATINS ! DOXYCYCLINE  Past Medical History:    Reviewed history from 03/15/2008 and no changes required:       COUGH (ICD-786.2)          -  onset 2003, pos response to acid reflux rx,            -  minimal sinus dz by ct 02/08/2006       HYSTERECTOMY, HX OF (ICD-V45.77)       APPENDECTOMY, HX OF (ICD-V45.79)       CHOLECYSTECTOMY, HX OF (ICD-V45.79)       DEPRESSION (ICD-311)       RHINITIS, ALLERGIC (ICD-477.9)       OSTEOARTHRITIS, MULTI SITES (ICD-715.98)       OBESITY, NOS (ICD-278.00)       INCONTINENCE, URGE (ICD-788.31)       HYPERTENSION, BENIGN SYSTEMIC (ICD-401.1)       HYPERLIPIDEMIA (ICD-272.4)       GASTROESOPHAGEAL REFLUX, NO ESOPHAGITIS (ICD-530.81)       DIVERTICULITIS OF COLON, NOS  (ICD-562.11)       CORONARY, ARTERIOSCLEROSIS (ICD-414.00)       COLON POLYP (ICD-211.3)       ASTHMA, UNSPECIFIED (ICD-493.90)          Family History:    Reviewed history from 09/15/2006 and no changes required:       Brother died at 48, Father died at 13, heart dz, Mother died at 70 - heart dz, RA, HTN, Sisters 29- living with RA, MI, Sisters 46- living with heart dz, SLE, HTN  Social History:    Reviewed history from 10/25/2007 and no changes required:       Never Married, son in [place].         Never smoked       Denies ETOH.         Able to do ADLs.  Ambulates with cane.  Attends church.  Unable to exercise due to knee pain. 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     Vital Signs:  Patient Profile:   78 Years Old Female Height:     63 inches Weight:      187 pounds O2 Sat:      98 % O2 treatment:    Room Air Temp:     97.6 degrees F oral Pulse rate:   71 / minute BP sitting:   140 / 82  (left arm)  Vitals Entered By: Vernie Murders (May 24, 2008 9:51 AM)                 Physical Exam  In general, animated minimally hoarse ambulatory black female Afeb with normal vital signs wt  184  03/15/08 > 187 May 24, 2008  HEENT: only a few low teeth remaining, upper dentures mod non specific edema of both  turbinates, and orophanx. Nl external ear canals without cough reflex Neck without JVD/Nodes/TM Lungs clear to A and P bilaterally without cough on insp or exp maneuvers RRR no s3 or murmur or increase in P2 Abd soft and benign with nl excursion in the supine position. No bruits or organomegaly Ext warm without calf tenderness, cyanosis clubbing or edema Skin warm and dry without lesions        Impression & Recommendations:  Problem # 1:  COUGH (ICD-786.2) Confirmed reflux per Dr Jenne Pane.so should try to avoid CCB if possible and optimize rx with benicar.  Flare of rhinitis / pnds after instrumentation without evidence infection.  try  short cycle of prednisone and otc rx with chlortrimeton as per med calendar instructions with fu Jenne Pane as needed.  continues to struggle with med reconciliation so return in 2 weeks for  new calendar   Orders: Est. Patient Level III ([address])   Problem # 2:  HYPERTENSION, BENIGN SYSTEMIC (ICD-401.1)  The following medications were removed from the medication list:    Amlodipine Besylate 2.5 Mg Tabs (Amlodipine besylate) .Marland Kitchen... 1 once daily  Her updated medication list for this problem includes:    Hydrochlorothiazide 25 Mg Tabs (Hydrochlorothiazide) .Marland Kitchen... Take 1/2 tablet once a day    Bystolic 10 Mg Tabs (Nebivolol hcl) .Marland Kitchen... Take 1 tablet by mouth every morning    Benicar 20 Mg Tabs (Olmesartan medoxomil) .Marland Kitchen... 2 each am    Medications Added to Medication List This Visit: 1)  Synthroid 112 Mcg Tabs (Levothyroxine sodium) .Marland Kitchen.. 1 by mouth once daily 2)  Zegerid 40-1100 Mg Caps (Omeprazole-sodium bicarbonate) .Marland Kitchen.. 1 two times a day 3)  Benicar 20 Mg Tabs (Olmesartan medoxomil) .Marland Kitchen.. 1 once daily 4)  Benicar 20 Mg Tabs (Olmesartan medoxomil) .... 2 each am 5)  Prednisone 10 Mg Tabs (Prednisone) .... 4 each am x 2days, 2x2days, 1x2days and stop  Complete Medication List: 1)  Klor-con M20 20 Meq Tbcr (Potassium chloride crys cr) .... Take 1 tablet by mouth once a day 2)  Bayer Aspirin Ec Low Dose 81 Mg Tbec (Aspirin) .... Take 1 tablet by mouth once a day 3)  Pepcid Ac 10 Mg Tabs (Famotidine) .... Take 1 tab by mouth at bedtime 4)  Metoclopramide Hcl 10 Mg Tabs (Metoclopramide hcl) .... One half  before each meal and at bedtime 5)  Hydrochlorothiazide 25 Mg Tabs (Hydrochlorothiazide) .... Take 1/2 tablet once a day 6)  Bystolic 10 Mg Tabs (Nebivolol hcl) .... Take 1 tablet by mouth every morning 7)  Synthroid 112 Mcg Tabs (Levothyroxine sodium) .Marland Kitchen.. 1 by mouth once daily 8)  Plavix 75 Mg Tabs (Clopidogrel bisulfate) .... Take 1 tablet by mouth once a day 9)  Citrucel Powd (Methylcellulose  (laxative)) .Marland Kitchen.. 1 tbsp every morning 10)  Trazodone Hcl 100 Mg Tabs (Trazodone hcl) .... Take one tablet at bedtime 11)  Vitamin D 82956 Unit Caps (Ergocalciferol) .... Take 1 tablet twice a week on sundays and wednesdays 12)  Caltrate 600+d 600-400 Mg-unit Tabs (Calcium carbonate-vitamin d) .... Take one tablet two times a day 13)  Miralax Powd (Polyethylene glycol 3350) .Marland Kitchen.. 1 capful at bedtime 14)  Zegerid 40-1100 Mg Caps (Omeprazole-sodium bicarbonate) .Marland Kitchen.. 1 two times a day 15)  Benicar 20 Mg Tabs (Olmesartan medoxomil) .... 2 each am 16)  Ed-chlor-tan 8 Mg Tabs (Chlorpheniramine tannate) .... One twice daily 17)  [facility] Saline Nasal Spray 0.65 % Soln (Saline) .... 2 puffs every 4 hours as needed 18)  Nitroquick 0.4 Mg Subl (Nitroglycerin) .Marland Kitchen.. 1 under tongue every 5 minutes x 3 as needed 19)  Delsym 30 Mg/56ml Lqcr (Dextromethorphan polistirex) .... 2 tsp every 12 hours as needed 20)  Vicodin 5-500 Mg Tabs (Hydrocodone-acetaminophen) .... Take 1 tablet by mouth every 4 hours as needed 21)  Prednisone 10 Mg Tabs (Prednisone) .... 4 each am x 2days, 2x2days, 1x2days and stop   Patient Instructions: 1)  See Tammy in 2 weeks with all your medications to generate a new medication calendar  and to recheck your blood pressure off the amlodipine (which may be contributing to your reflux)   Prescriptions: PREDNISONE 10 MG  TABS (PREDNISONE) 4 each am x 2days, 2x2days, 1x2days and stop  #14 x 0   Entered and Authorized by:   Nyoka Cowden MD   Signed by:   Nyoka Cowden MD  on 05/24/2008   Method used:   Electronically to        [facility]* (retail)       [address]       [place], [place]  [id number]       Ph: [phone]       Fax: [phone]   RxID:   [id number]  ]

## 2010-08-20 NOTE — Assessment & Plan Note (Signed)
Summary: Pulmonary/ cough exac, try increase reglan to 10 qidx2wk   PCP:  Eric Form  Chief Complaint:  Acute visit.  Pt c/o prod cough with 2 wks with thick clear sputum.  Pt states sometimes she coughs until vommits..  History of Present Illness: 77 year old AAF  never smoker with a history of cough that dates back to 2004  that inconsistently responds to treatment directed at acid reflux.    March 15, 2008 ov doing  well and note that she is using the medication calendar now consistently which is the first time this is happened and correlates with excellent control of her symptoms.  Oct 30 saw bates with ha and sore throat, dx'd reflux, then worse the following day with sneezing and pain in face.  May 24, 2008 co  itching sneezing, minimal dry cough.--norvasc stopped, chlortrimeton rec.   June 07, 2008 -follow up and med review. doing well  07/17/08 worsening again with taper reglan, ran out of vicodin and didn't call for refills. not clear she really understands  how to use a calendar she carries although it is written in extremely user-friendly unambiguous fashion. convinced benicar is making her cough.    Pt denies any significant sore throat, nasal congestion or excess secretions, fever, chills, sweats, unintended wt loss, pleuritic or exertional cp, orthopnea pnd or leg swelling.          Updated Prior Medication List: KLOR-CON M20 20 MEQ  TBCR (POTASSIUM CHLORIDE CRYS CR) Take 1 tablet by mouth once a day BAYER ASPIRIN EC LOW DOSE 81 MG  TBEC (ASPIRIN) Take 1 tablet by mouth once a day ZEGERID 40-1100 MG CAPS (OMEPRAZOLE-SODIUM BICARBONATE) 1 two times a day METOCLOPRAMIDE HCL 10 MG  TABS (METOCLOPRAMIDE HCL) ONE HALF  before each meal and at bedtime HYDROCHLOROTHIAZIDE 25 MG  TABS (HYDROCHLOROTHIAZIDE) Take 1/2 tablet once a day BYSTOLIC 10 MG  TABS (NEBIVOLOL HCL) Take 1 tablet by mouth every morning SYNTHROID 112 MCG TABS (LEVOTHYROXINE SODIUM) 1 by mouth once  daily PLAVIX 75 MG  TABS (CLOPIDOGREL BISULFATE) Take 1 tablet by mouth once a day CITRUCEL   POWD (METHYLCELLULOSE (LAXATIVE)) 1 tbsp every morning TRAZODONE HCL 100 MG  TABS (TRAZODONE HCL) take one tablet at bedtime VITAMIN D 16109 UNIT  CAPS (ERGOCALCIFEROL) Take 1 tablet twice a week on Sundays and Wednesdays CALTRATE 600+D 600-400 MG-UNIT  TABS (CALCIUM CARBONATE-VITAMIN D) take one tablet two times a day MIRALAX   POWD (POLYETHYLENE GLYCOL 3350) 1 capful at bedtime BENICAR 40 MG TABS (OLMESARTAN MEDOXOMIL) 1 by mouth once daily VITAMIN C 500 MG TABS (ASCORBIC ACID) 1 once daily [facility] SALINE NASAL SPRAY 0.65 %  SOLN (SALINE) 2 puffs every 4 hours as needed NITROQUICK 0.4 MG  SUBL (NITROGLYCERIN) 1 under tongue every 5 minutes x 3 as needed ED-CHLOR-TAN 8 MG  TABS (CHLORPHENIRAMINE TANNATE) one twice daily DELSYM 30 MG/5ML  LQCR (DEXTROMETHORPHAN POLISTIREX) 2 tsp every 12 hours as needed VICODIN 5-500 MG  TABS (HYDROCODONE-ACETAMINOPHEN) Take 1 tablet by mouth every 4 hours as needed  Current Allergies (reviewed today): ! PREDNISONE ! * STATINS ! DOXYCYCLINE  Past Medical History:    COUGH (ICD-786.2)       -  onset 2003, pos response to acid reflux rx,         -  minimal sinus dz by ct 02/08/2006    HYSTERECTOMY, HX OF (ICD-V45.77)    APPENDECTOMY, HX OF (ICD-V45.79)    CHOLECYSTECTOMY, HX OF (ICD-V45.79)    DEPRESSION (  ICD-311)    RHINITIS, ALLERGIC (ICD-477.9)    OSTEOARTHRITIS, MULTI SITES (ICD-715.98)    OBESITY, NOS (ICD-278.00)    INCONTINENCE, URGE (ICD-788.31)    HYPERTENSION, BENIGN SYSTEMIC (ICD-401.1)    HYPERLIPIDEMIA (ICD-272.4)    GASTROESOPHAGEAL REFLUX, NO ESOPHAGITIS (ICD-530.81)    DIVERTICULITIS OF COLON, NOS (ICD-562.11)    CORONARY, ARTERIOSCLEROSIS (ICD-414.00)    COLON POLYP (ICD-211.3)    ASTHMA, UNSPECIFIED (ICD-493.90)    COMPLEX MEDICAL REGIMEN       - Med calendar 09/26/07           Family History:    Reviewed history from 09/15/2006 and no  changes required:       Brother died at 40, Father died at 93, heart dz, Mother died at 4 - heart dz, RA, HTN, Sisters 93- living with RA, MI, Sisters 37- living with heart dz, SLE, HTN  Social History:    Reviewed history from 10/25/2007 and no changes required:       Never Married, son in [place].         Never smoked       Denies ETOH.         Able to do ADLs.  Ambulates with cane.  Attends church.  Unable to exercise due to knee pain. 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    Review of Systems  The patient denies anorexia, fever, weight loss, weight gain, vision loss, decreased hearing, hoarseness, chest pain, syncope, dyspnea on exertion, peripheral edema, headaches, hemoptysis, abdominal pain, melena, hematochezia, severe indigestion/heartburn, hematuria, incontinence, muscle weakness, suspicious skin lesions, transient blindness, difficulty walking, depression, unusual weight change, abnormal bleeding, enlarged lymph nodes, and angioedema.     Vital Signs:  Patient Profile:   77 Years Old Female Height:     63 inches Weight:      189 pounds O2 Sat:      96 % O2 treatment:    Room Air Temp:     98.7 degrees F oral Pulse rate:   60 / minute BP sitting:   162 / 90  (left arm)  Vitals Entered By: Vernie Murders (July 17, 2008 1:28 PM)                 Physical Exam  In general,  minimally hoarse ambulatory black female talking a mile a minute with classic voice fatigue and throat clearing Afeb with normal vital signs wt  184  03/15/08 > 187 May 24, 2008  > 189 07/17/08  HEENT: only a few low teeth remaining, upper dentures mod non specific edema of both  turbinates, and orophanx. Nl external ear canals without cough reflex Neck without JVD/Nodes/TM Lungs clear to A and P bilaterally without cough on insp or exp maneuvers RRR no s3 or murmur or increase in P2 Abd soft and benign with nl excursion in the supine position. No bruits or  organomegaly Ext warm without calf tenderness, cyanosis clubbing or edema Skin warm and dry without lesions        Impression & Recommendations:  Problem # 1:  COUGH (ICD-786.2) I had an extended discussion with the patient today lasting 15 to 20 minutes of a 25 minute visit on the following issues:   her present cough flared in retrospect when she reduced Reglan and failed to use cough suppressant measures as requested because she is not following her p.r.n. list outlined on her medication calendar. she is now coughing so hard she is vomiting so clearly  has reflux, albeit not necessarily acid reflux since this is being suppressed.   Each maintenance medication was reviewed in detail including most importantly the difference between maintenance and as needed and under what circumstances the prns are to be used. This was done in the context of a medication calendar review which provided the patient with a user-friendly unambiguous mechanism for medication administration and reconciliation. It is critical that this be shown to every doctor he sees for modification during the office visit if necessary so the patient can use it as a working document.  we need to use a trust but verify approach year so the next step is however returned with the medicines corresponding to the calendar to make sure there is 100% reconciliation.   Orders: Est. Patient Level IV (16109)   Problem # 2:  HYPERTENSION, BENIGN SYSTEMIC (ICD-401.1) I strongly doubt her blood pressure medicines are making her cough based on previous response to treatment directed reflux. one option we might consider to reduce her cost of care would be to eliminate Benicar and switch over to minoxidil  Her updated medication list for this problem includes:    Hydrochlorothiazide 25 Mg Tabs (Hydrochlorothiazide) .Marland Kitchen... Take 1/2 tablet once a day    Bystolic 10 Mg Tabs (Nebivolol hcl) .Marland Kitchen... Take 1 tablet by mouth every morning    Benicar  40 Mg Tabs (Olmesartan medoxomil) .Marland Kitchen... 1 by mouth once daily   Problem # 3:  GASTROESOPHAGEAL REFLUX, NO ESOPHAGITIS (ICD-530.81) PleDiscussed in detail all the  indications, usual  risks and alternatives  relative to the benefits with patient who agrees to proceed with short term reglan use ( 2 weeks to asses response and consider GI referral if needed).   Medications Added to Medication List This Visit: 1)  Vitamin C 500 Mg Tabs (Ascorbic acid) .Marland Kitchen.. 1 once daily 2)  Vicodin 5-500 Mg Tabs (Hydrocodone-acetaminophen) .... Take 1 tablet by mouth every 4 hours as needed  Complete Medication List: 1)  Klor-con M20 20 Meq Tbcr (Potassium chloride crys cr) .... Take 1 tablet by mouth once a day 2)  Bayer Aspirin Ec Low Dose 81 Mg Tbec (Aspirin) .... Take 1 tablet by mouth once a day 3)  Zegerid 40-1100 Mg Caps (Omeprazole-sodium bicarbonate) .Marland Kitchen.. 1 two times a day 4)  Metoclopramide Hcl 10 Mg Tabs (Metoclopramide hcl) .... One half  before each meal and at bedtime 5)  Hydrochlorothiazide 25 Mg Tabs (Hydrochlorothiazide) .... Take 1/2 tablet once a day 6)  Bystolic 10 Mg Tabs (Nebivolol hcl) .... Take 1 tablet by mouth every morning 7)  Synthroid 112 Mcg Tabs (Levothyroxine sodium) .Marland Kitchen.. 1 by mouth once daily 8)  Plavix 75 Mg Tabs (Clopidogrel bisulfate) .... Take 1 tablet by mouth once a day 9)  Citrucel Powd (Methylcellulose (laxative)) .Marland Kitchen.. 1 tbsp every morning 10)  Trazodone Hcl 100 Mg Tabs (Trazodone hcl) .... Take one tablet at bedtime 11)  Vitamin D 60454 Unit Caps (Ergocalciferol) .... Take 1 tablet twice a week on sundays and wednesdays 12)  Caltrate 600+d 600-400 Mg-unit Tabs (Calcium carbonate-vitamin d) .... Take one tablet two times a day 13)  Miralax Powd (Polyethylene glycol 3350) .Marland Kitchen.. 1 capful at bedtime 14)  Benicar 40 Mg Tabs (Olmesartan medoxomil) .Marland Kitchen.. 1 by mouth once daily 15)  Vitamin C 500 Mg Tabs (Ascorbic acid) .Marland Kitchen.. 1 once daily 16)  [facility] Saline Nasal Spray 0.65 % Soln  (Saline) .... 2 puffs every 4 hours as needed 17)  Nitroquick 0.4 Mg Subl (Nitroglycerin) .Marland KitchenMarland KitchenMarland Kitchen  1 under tongue every 5 minutes x 3 as needed 18)  Ed-chlor-tan 8 Mg Tabs (Chlorpheniramine tannate) .... One twice daily 19)  Delsym 30 Mg/66ml Lqcr (Dextromethorphan polistirex) .... 2 tsp every 12 hours as needed 20)  Vicodin 5-500 Mg Tabs (Hydrocodone-acetaminophen) .... Take 1 tablet by mouth every 4 hours as needed   Patient Instructions: 1)  See calendar for specific medication instructions and bring it back for each and every office visit for every healthcare provider you see.  Without it,  you may not receive the best quality medical care that we feel you deserve.  2)  Increase the reglan to one whole pill before meal and at  bedtime 3)  Return to see Tammy in 2 weeks with meds with meds separated into two separate bags to verify the calendar we wrote you is correct.   Prescriptions: VICODIN 5-500 MG  TABS (HYDROCODONE-ACETAMINOPHEN) Take 1 tablet by mouth every 4 hours as needed  #40 x 0   Entered and Authorized by:   Nyoka Cowden MD   Signed by:   Nyoka Cowden MD on 07/17/2008   Method used:   Print then Give to Patient   RxID:   [id number]  ]

## 2010-08-20 NOTE — Assessment & Plan Note (Signed)
Summary: 1 WK MED CAL///KWP   Chief Complaint:  1 week med calendar.  History of Present Illness: A 77 year old black female pt. of Dr Sherene Sires with history of severe chronic cough dating back to 2003 that had resolved nicely each time we treated her aggressively for reflux dating back to 2004.  Pt. presents today for follow up  and medication review. Last OV pt was treated with reglan qid and vicodinand prednisone taper for severe breakthrough cough. She returns today with no significant change in cough. Medications are correct with med list and appears to  be taking meds consistently.    Current Allergies (reviewed today): ! PREDNISONE ! * STATINS ! DOXYCYCLINE Updated/Current Medications (including changes made in today's visit):  KLOR-CON M20 20 MEQ  TBCR (POTASSIUM CHLORIDE CRYS CR) Take 1 tablet by mouth once a day BAYER ASPIRIN 325 MG  TABS (ASPIRIN) Take 1 tablet by mouth once a day AMLODIPINE BESYLATE 5 MG  TABS (AMLODIPINE BESYLATE) Take 1 tablet by mouth once a day PREVACID 30 MG  CPDR (LANSOPRAZOLE) take one tablet two times a day METOCLOPRAMIDE HCL 10 MG  TABS (METOCLOPRAMIDE HCL) Take 1 tablet by mouth four times a day HYDROCHLOROTHIAZIDE 25 MG  TABS (HYDROCHLOROTHIAZIDE) Take 1 tablet by mouth once a day BYSTOLIC 5 MG  TABS (NEBIVOLOL HCL) Take 1 tablet by mouth once a day SYNTHROID 100 MCG  TABS (LEVOTHYROXINE SODIUM) Take 1 tablet by mouth once a day PLAVIX 75 MG  TABS (CLOPIDOGREL BISULFATE) Take 1 tablet by mouth once a day CYMBALTA 30 MG  CPEP (DULOXETINE HCL) Take 1 tablet by mouth once a day TRAZODONE HCL 100 MG  TABS (TRAZODONE HCL) take one tablet at bedtime VITAMIN D 78295 UNIT  CAPS (ERGOCALCIFEROL) Take 1 tablet twice a week CALTRATE 600+D 600-400 MG-UNIT  TABS (CALCIUM CARBONATE-VITAMIN D) take one tablet two times a day MIRALAX   POWD (POLYETHYLENE GLYCOL 3350) daily CITRUCEL 500 MG  TABS (METHYLCELLULOSE (LAXATIVE)) Take 1 tablet by mouth once a day [facility]  SALINE NASAL SPRAY 0.65 %  SOLN (SALINE) 2 puffs every 4 hours as needed NITROQUICK 0.4 MG  SUBL (NITROGLYCERIN) 1 under tongue every 5 minutes x 3 as needed ZYRTEC ALLERGY 10 MG  TABS (CETIRIZINE HCL) Take 1 tablet by mouth once a day DELSYM 30 MG/5ML  LQCR (DEXTROMETHORPHAN POLISTIREX) 2 tsp every 12 hours as needed VICODIN 5-500 MG  TABS (HYDROCODONE-ACETAMINOPHEN) Take 1 tablet by mouth every 4 hours as needed   Past Medical History:    Reviewed history from 09/15/2006 and no changes required:       ammonium lactate12%/HC 2.5 % cream TID, bilateral cataracts, chronic cough, ?asthma variant, eczema, hx of depression, MI/ 10/2003 (very small inferoapical), mild mitral and tricuasic regurgitation(Echo 11/06, mild to mod LVH w/diastolic dysfunction(Echo11/06    Risk Factors:  Tobacco use:  never    Vital Signs:  Patient Profile:   77 Years Old Female Weight:      172.38 pounds O2 Sat:      99 % Temp:     97.1 degrees F oral Pulse rate:   62 / minute BP sitting:   122 / 66  (left arm) Cuff size:   regular  Vitals Entered By: Boone Master CNA (July 03, 2007 11:18 AM) Oxygen therapy Room Air             Comments Meds reviewed ..................................................................Marland KitchenBoone Master CNA  July 03, 2007 11:29 AM      Physical Exam  No acute  distress. VSS.  HEENT: unremarkable. Oropharynx is clear. TM nml, EAC clear Neck: supple, no adenopathy, or JVD Lungs: CTA bilat. no wheezing/crackles Card: r/r/r, no m/r/g Abd: soft, nontender Ext: warm w/o calf tenderness, cyanosis clubbing, or edema.       Impression & Recommendations:  Problem # 1:  COUGH (ICD-786.2) Recurrent flare-up despite aggressive reflux prevention with reglan and prevacid. Recommend add delsym 2 tsp every 12 hrs as needed for cough, with vicodin for breakthrough cough. Add zyrtec at bedtime for nasal drainage. Change atenolol over to Bystolic 5 mg daily. Follow back in  2 weeks with Dr. Sherene Sires. Please contact office for sooner follow up if symptoms do not improve or worsen  Orders: Est. Patient Level IV (16109)   Problem # 2:  GASTROESOPHAGEAL REFLUX, NO ESOPHAGITIS (ICD-530.81) Continue on prevacid 30 mg two times a day and reglan four times a day along with reflux preventive diet.  Her updated medication list for this problem includes:    Prevacid 30 Mg Cpdr (Lansoprazole) .Marland Kitchen... Take one tablet two times a day  Orders: Est. Patient Level IV (60454)   Medications Added to Medication List This Visit: 1)  Bayer Aspirin 325 Mg Tabs (Aspirin) .... Take 1 tablet by mouth once a day 2)  Metoclopramide Hcl 10 Mg Tabs (Metoclopramide hcl) .... Take 1 tablet by mouth four times a day 3)  Bystolic 5 Mg Tabs (Nebivolol hcl) .... Take 1 tablet by mouth once a day 4)  Cymbalta 30 Mg Cpep (Duloxetine hcl) .... Take 1 tablet by mouth once a day 5)  Vitamin D 09811 Unit Caps (Ergocalciferol) .... Take 1 tablet twice a week 6)  Miralax Powd (Polyethylene glycol 3350) .... Daily 7)  [facility]vs Saline Nasal Spray 0.65 % Soln (Saline) .... 2 puffs every 4 hours as needed 8)  Nitroquick 0.4 Mg Subl (Nitroglycerin) .Marland Kitchen.. 1 under tongue every 5 minutes x 3 as needed 9)  Zyrtec Allergy 10 Mg Tabs (Cetirizine hcl) .... Take 1 tablet by mouth once a day 10)  Delsym 30 Mg/108ml Lqcr (Dextromethorphan polistirex) .... 2 tsp every 12 hours as needed 11)  Vicodin 5-500 Mg Tabs (Hydrocodone-acetaminophen) .... Take 1 tablet by mouth every 4 hours as needed   Patient Instructions: 1)  Please schedule a follow-up appointment in 2 weeks with Dr. Sherene Sires.  2)  Stop Atenolol, Begin Bystolic 5mg  daily. Use delsym for cough as directed.  3)  Please contact office for sooner follow up if symptoms do not improve or worsen    ]

## 2010-08-20 NOTE — Progress Notes (Signed)
Summary: Speak to nuse   Phone Note Call from Patient Call back at Home Phone [phone]   Call For: DR Russella Dar Reason for Call: Talk to Nurse Summary of Call: Has a misunderstading about the stool cards-would like to speak to nurse. Initial call taken by: Leanor Kail [facility],  September 02, 2008 12:01 PM  Follow-up for Phone Call        reviewed instructions for hemeoccult cards with patient , she will return them soon. Follow-up by: Darcey Nora RN,  September 02, 2008 12:57 PM

## 2010-08-20 NOTE — Progress Notes (Signed)
Summary: still sick  Phone Note Call from Patient Call back at [facility] Phone [phone]   Caller: Patient Call For: wert/tammy p Reason for Call: Acute Illness, Talk to Nurse, Talk to Doctor Complaint: Cough/Sore throat Summary of Call: coughing, taken meds as prescribed, not helping.  Doesn't want to take any more than already taking on these meds, but need something.  Please advise. Initial call taken by: Eugene Gavia,  August 19, 2008 10:32 AM  Follow-up for Phone Call        pt c/o coughing-prod-white and thick, chest and stomach sore from cough, hoarsness, no fever, having sinus drainage, runny nose, runny eyes pt is using delsym cough syrup and hydrocodone pill this is not helping  pt unable to come in for appt due to weather can you pls send something to pharmacy per pt request Follow-up by: Philipp Deputy CMA,  August 19, 2008 11:22 AM  Additional Follow-up for Phone Call Additional follow up Details #1::        ok to do prednisone 10 mg #14 4 each am x 2days, 2x2days, 1x2days and stop   nothing stronger available, needs ov with all meds and med calendar - only other option is to admit Additional Follow-up by: Nyoka Cowden MD,  August 19, 2008 12:00 PM    Additional Follow-up for Phone Call Additional follow up Details #2::    Called spoke with pt.  Advised rx for prednisone called into pharmacy.  Advised if no better may need admit.  Pt states she will call back if worsens or no better for appt or go to ED if acutely worse. Follow-up by: Cloyde Reams RN,  August 19, 2008 12:13 PM  New/Updated Medications: PREDNISONE 10 MG TABS (PREDNISONE) Take 4 tabs x 2 day, 2tabs x 2 days, then 1tabs x 2 days then d/c   Prescriptions: PREDNISONE 10 MG TABS (PREDNISONE) Take 4 tabs x 2 day, 2tabs x 2 days, then 1tabs x 2 days then d/c  #14 x 0   Entered by:   Cloyde Reams RN   Authorized by:   Nyoka Cowden MD   Signed by:   Cloyde Reams RN on 08/19/2008   Method used:    Electronically to        [facility] (retail)       [address]       [place], [place]  [id number]       Ph: [phone]       Fax: [phone]   RxID:   [id number]

## 2010-08-20 NOTE — Progress Notes (Signed)
Summary: cough  Phone Note Call from Patient Call back at [facility] Phone [phone]   Caller: Patient Call For: wert Reason for Call: Talk to Nurse Complaint: Cough/Sore throat Summary of Call: increased pt's Benicar...now has a really bad cough, getting worse.  Taking Delsym for cough, but not helping. Initial call taken by: Eugene Gavia,  July 15, 2008 9:20 AM  Follow-up for Phone Call        Spoke with pt.  She c/o increased cough x 1 wk.  She relates this to increase in benicar.  Taking delsym without help.  OV with MW sched for 07-17-08 at 1:30 Follow-up by: Vernie Murders,  July 15, 2008 9:51 AM

## 2010-08-20 NOTE — Assessment & Plan Note (Signed)
Summary: FU 2 WKS PER MW///KP   PCP:  Eric Form  Chief Complaint:  follow up for medrol and bistolic.  History of Present Illness: 77 year old AAF with a history of cough that dates back to 2004  that inconsistently responds to treatment directed at acid reflux. Last visit her reflux prevention regimen and medication list was reviewed with patient.  She was continued on aggressive reflux prevention with prevacid 30 mg twice daily, reglan, pepcid at bedtime. Atenolol was changed over to Bystolic. She was given a medrol dose pack and recommended on cough suppression regimen Since last visit cough much better, 95 % better, now finished  medrol dose pack.   Current Allergies (reviewed today): ! PREDNISONE ! * STATINS ! DOXYCYCLINE Updated/Current Medications (including changes made in today's visit):  KLOR-CON M20 20 MEQ  TBCR (POTASSIUM CHLORIDE CRYS CR) Take 1 tablet by mouth once a day BAYER ASPIRIN 325 MG  TABS (ASPIRIN) Take 1 tablet by mouth once a day PEPCID AC 10 MG  TABS (FAMOTIDINE) Take 1 tab by mouth at bedtime PREVACID 30 MG  CPDR (LANSOPRAZOLE) take one tablet two times a day METOCLOPRAMIDE HCL 10 MG  TABS (METOCLOPRAMIDE HCL) Take 1 tablet by mouth four times a day HYDROCHLOROTHIAZIDE 25 MG  TABS (HYDROCHLOROTHIAZIDE) Take 1 tablet by mouth once a day BYSTOLIC 10 MG  TABS (NEBIVOLOL HCL) Take 1 tablet by mouth every morning SYNTHROID 100 MCG  TABS (LEVOTHYROXINE SODIUM) Take 1 tablet by mouth once a day PLAVIX 75 MG  TABS (CLOPIDOGREL BISULFATE) Take 1 tablet by mouth once a day CITRUCEL 500 MG  TABS (METHYLCELLULOSE (LAXATIVE)) Take 1 tablet by mouth once a day TRAZODONE HCL 100 MG  TABS (TRAZODONE HCL) take one tablet at bedtime VITAMIN D 16109 UNIT  CAPS (ERGOCALCIFEROL) Take 1 tablet twice a week CALTRATE 600+D 600-400 MG-UNIT  TABS (CALCIUM CARBONATE-VITAMIN D) take one tablet two times a day MIRALAX   POWD (POLYETHYLENE GLYCOL 3350) daily [facility]S SALINE NASAL SPRAY 0.65 %   SOLN (SALINE) 2 puffs every 4 hours as needed NITROQUICK 0.4 MG  SUBL (NITROGLYCERIN) 1 under tongue every 5 minutes x 3 as needed ZYRTEC ALLERGY 10 MG  TABS (CETIRIZINE HCL) Take 1 tablet by mouth once a day DELSYM 30 MG/5ML  LQCR (DEXTROMETHORPHAN POLISTIREX) 2 tsp every 12 hours as needed VICODIN 5-500 MG  TABS (HYDROCODONE-ACETAMINOPHEN) Take 1 tablet by mouth every 4 hours as needed   Past Medical History:        COUGH (ICD-786.2)    HYSTERECTOMY, HX OF (ICD-V45.77)    APPENDECTOMY, HX OF (ICD-V45.79)    CHOLECYSTECTOMY, HX OF (ICD-V45.79)    DEPRESSION (ICD-311)    RHINITIS, ALLERGIC (ICD-477.9)    OSTEOARTHRITIS, MULTI SITES (ICD-715.98)    OBESITY, NOS (ICD-278.00)    INCONTINENCE, URGE (ICD-788.31)    HYPERTENSION, BENIGN SYSTEMIC (ICD-401.1)    HYPERLIPIDEMIA (ICD-272.4)    GASTROESOPHAGEAL REFLUX, NO ESOPHAGITIS (ICD-530.81)    DIVERTICULITIS OF COLON, NOS (ICD-562.11)    CORONARY, ARTERIOSCLEROSIS (ICD-414.00)    COLON POLYP (ICD-211.3)    ASTHMA, UNSPECIFIED (ICD-493.90)          Review of Systems      See HPI   Vital Signs:  Patient Profile:   77 Years Old Female Height:     63 inches Weight:      171 pounds O2 Sat:      98 % O2 treatment:    Room Air Temp:     97.7 degrees F oral Pulse rate:  56 / minute BP sitting:   136 / 84  (left arm) Cuff size:   regular  Vitals Entered By: Boone Master CNA (August 10, 2007 1:06 PM)             Is Patient Diabetic? No Comments Medications reviewed with patient ..................................................................Marland KitchenBoone Master CNA  August 10, 2007 1:07 PM      Physical Exam  Afeb with normal vital signs HEENT: nl dentition, turbinates, and orophanx. Nl external ear canals without cough reflex Neck without JVD/Nodes/TM Lungs CTA bilaterally  RRR no s3 or murmur or increase in P2 Abd soft and benign with nl excursion in the supine position. No bruits or organomegaly Ext warm without  calf tenderness, cyanosis clubbing or edema Skin warm and dry without lesions       Impression & Recommendations:  Problem # 1:  COUGH (ICD-786.2) Recurrent flare-ups now improved with  aggressive reflux prevention with reglan and prevacid, and pepcid. Continue on cyclical cough regiment until 100% cough free. .  Follow back in 1 month  with Dr. Sherene Sires. Please contact office for sooner follow up if symptoms do not improve or worsen   Orders: Est. Patient Level III (40981)   Medications Added to Medication List This Visit: 1)  Pepcid Ac 10 Mg Tabs (Famotidine) .... Take 1 tab by mouth at bedtime 2)  Bystolic 10 Mg Tabs (Nebivolol hcl) .... Take 1 tablet by mouth every morning   Patient Instructions: 1)  Please schedule a follow-up appointment in 1 month Dr. Sherene Sires  2)  Please contact office for sooner follow up if symptoms do not improve or worsen  3)  Continue medications per list.     ]

## 2010-08-20 NOTE — Assessment & Plan Note (Signed)
Summary: Pulmonary/ fu cough   PCP:  Eric Form  Chief Complaint:  3 month followup.  Pt states that cough has resolved.  She c/o itchy and watery eyes and runny nose.  Marland Kitchen  History of Present Illness: 77 year old AAF  never smoker with a history of cough that dates back to 2004  that inconsistently responds to treatment directed at acid reflux.    Last visit , was given prednisone rapid taper and Chlorpheniramine was added and she feels this does better than anything else when she remembers to take it per her as needed list. Pt denies any significant sore throat, nasal congestion or excess secretions, fever, chills, sweats, unintended wt loss, pleuritic or exertional cp, orthopnea pnd or leg swelling.  Pt also denies any obvious fluctuation in symptoms with weather or environmental change or other alleviating or aggravating factors.     Dec 07, 2007 doing better, no change in rx  March 15, 2008 continues to do well and note that she is using the medication calendar now consistently which is the first time this is happened and correlates with excellent control of her symptoms.  Pt denies any significant sore throat, nasal congestion or excess secretions, fever, chills, sweats, unintended wt loss, pleuritic or exertional cp, orthopnea pnd or leg swelling.  Pt also denies any obvious fluctuation in symptoms with weather or environmental change or other alleviating or aggravating factors.           Updated Prior Medication List: KLOR-CON M20 20 MEQ  TBCR (POTASSIUM CHLORIDE CRYS CR) Take 1 tablet by mouth once a day BAYER ASPIRIN EC LOW DOSE 81 MG  TBEC (ASPIRIN) Take 1 tablet by mouth once a day PEPCID AC 10 MG  TABS (FAMOTIDINE) Take 1 tab by mouth at bedtime PREVACID 30 MG  CPDR (LANSOPRAZOLE) take one tablet two times a day METOCLOPRAMIDE HCL 10 MG  TABS (METOCLOPRAMIDE HCL) Take 1 tablet by mouth four times a day HYDROCHLOROTHIAZIDE 25 MG  TABS (HYDROCHLOROTHIAZIDE) Take 1/2 tablet once  a day BYSTOLIC 10 MG  TABS (NEBIVOLOL HCL) Take 1 tablet by mouth every morning SYNTHROID 100 MCG  TABS (LEVOTHYROXINE SODIUM) Take 1 tablet by mouth once a day PLAVIX 75 MG  TABS (CLOPIDOGREL BISULFATE) Take 1 tablet by mouth once a day CITRUCEL   POWD (METHYLCELLULOSE (LAXATIVE)) 1 tbsp every morning TRAZODONE HCL 100 MG  TABS (TRAZODONE HCL) take one tablet at bedtime VITAMIN D 16109 UNIT  CAPS (ERGOCALCIFEROL) Take 1 tablet twice a week on Sundays and Wednesdays CALTRATE 600+D 600-400 MG-UNIT  TABS (CALCIUM CARBONATE-VITAMIN D) take one tablet two times a day MIRALAX   POWD (POLYETHYLENE GLYCOL 3350) 1 capful at bedtime AMLODIPINE BESYLATE 2.5 MG TABS (AMLODIPINE BESYLATE) 1 once daily ED-CHLOR-TAN 8 MG  TABS (CHLORPHENIRAMINE TANNATE) one twice daily [facility]S SALINE NASAL SPRAY 0.65 %  SOLN (SALINE) 2 puffs every 4 hours as needed NITROQUICK 0.4 MG  SUBL (NITROGLYCERIN) 1 under tongue every 5 minutes x 3 as needed ZYRTEC ALLERGY 10 MG  TABS (CETIRIZINE HCL) 1 daily as needed DELSYM 30 MG/5ML  LQCR (DEXTROMETHORPHAN POLISTIREX) 2 tsp every 12 hours as needed VICODIN 5-500 MG  TABS (HYDROCODONE-ACETAMINOPHEN) Take 1 tablet by mouth every 4 hours as needed  Current Allergies (reviewed today): ! PREDNISONE ! * STATINS ! DOXYCYCLINE  Past Medical History:    COUGH (ICD-786.2)       -  onset 2003, pos response to acid reflux rx,         -  minimal sinus dz by ct 02/08/2006    HYSTERECTOMY, HX OF (ICD-V45.77)    APPENDECTOMY, HX OF (ICD-V45.79)    CHOLECYSTECTOMY, HX OF (ICD-V45.79)    DEPRESSION (ICD-311)    RHINITIS, ALLERGIC (ICD-477.9)    OSTEOARTHRITIS, MULTI SITES (ICD-715.98)    OBESITY, NOS (ICD-278.00)    INCONTINENCE, URGE (ICD-788.31)    HYPERTENSION, BENIGN SYSTEMIC (ICD-401.1)    HYPERLIPIDEMIA (ICD-272.4)    GASTROESOPHAGEAL REFLUX, NO ESOPHAGITIS (ICD-530.81)    DIVERTICULITIS OF COLON, NOS (ICD-562.11)    CORONARY, ARTERIOSCLEROSIS (ICD-414.00)    COLON POLYP  (ICD-211.3)    ASTHMA, UNSPECIFIED (ICD-493.90)       Family History:    Reviewed history from 09/15/2006 and no changes required:       Brother died at 38, Father died at 39, heart dz, Mother died at 29 - heart dz, RA, HTN, Sisters 82- living with RA, MI, Sisters 3- living with heart dz, SLE, HTN  Social History:    Reviewed history from 10/25/2007 and no changes required:       Never Married, son in [place].         Never smoked       Denies ETOH.         Able to do ADLs.  Ambulates with cane.  Attends church.  Unable to exercise due to knee pain. 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     Vital Signs:  Patient Profile:   77 Years Old Female Height:     63 inches Weight:      184 pounds O2 Sat:      98 % O2 treatment:    Room Air Temp:     98.4 degrees F oral Pulse rate:   63 / minute BP sitting:   134 / 88  (left arm)  Vitals Entered By: Vernie Murders (March 15, 2008 9:28 AM)                 Physical Exam  In general, animated minimally hoarse bf talking a mile a minute in nad Afeb with normal vital signs wt 180  -> 184  03/15/08 HEENT: only a few low teeth remaining, upper dentures mod non specific edema of both  turbinates, and orophanx. Nl external ear canals without cough reflex Neck without JVD/Nodes/TM Lungs clear to A and P bilaterally without cough on insp or exp maneuvers RRR no s3 or murmur or increase in P2 Abd soft and benign with nl excursion in the supine position. No bruits or organomegaly Ext warm without calf tenderness, cyanosis clubbing or edema Skin warm and dry without lesions        Impression & Recommendations:  Problem # 1:  COUGH (ICD-786.2)  I had an extended discussion with the patient today lasting 15 to 20 minutes of a 25 minute visit on the following issues:   finally better with minimal use of prns and much better use of the medication calendar  in terms of managing airways disease, it is just as  important to know what medicines help when added versus which medicines when stopped the due exacerbation of her problems.  Look at her list the most worrisome medicine now in terms of side effects his Reglan although it may very well have made all the difference in terms of controlling her upper airways symptoms  Classic upper airway cough syndrome, so named because it's frequently impossible to sort out how much is LPR vs CR/sinusitis with freq throat clearing generating  secondary extra esophageal GERD from wide swings in gastric pressure that occur with throat clearing, promoting self use of mint and menthol lozenges that reduce the lower esophageal sphincter tone and exacerbate the problem further. I mentioned this to the patient because I need her to have the insight to self manage her problem and minimize the number of medications required as maintenance to control it.  try reduce reglan but not stop it.   Each maintenance medication was reviewed in detail including most importantly the difference between maintenance and as needed and under what circumstances the prns are to be used. This was done in the context of a medication calendar review which provided the patient with a user-friendly unambiguous mechanism for medication administration and reconciliation. It is critical that this be shown to every doctor he sees for modification during the office visit if necessary so the patient can use it as a working document.      Medications Added to Medication List This Visit: 1)  Metoclopramide Hcl 10 Mg Tabs (Metoclopramide hcl) .... One half  before each meal and at bedtime 2)  Hydrochlorothiazide 25 Mg Tabs (Hydrochlorothiazide) .... Take 1/2 tablet once a day 3)  Amlodipine Besylate 2.5 Mg Tabs (Amlodipine besylate) .Marland Kitchen.. 1 once daily  Complete Medication List: 1)  Klor-con M20 20 Meq Tbcr (Potassium chloride crys cr) .... Take 1 tablet by mouth once a day 2)  Bayer Aspirin Ec Low Dose 81 Mg  Tbec (Aspirin) .... Take 1 tablet by mouth once a day 3)  Pepcid Ac 10 Mg Tabs (Famotidine) .... Take 1 tab by mouth at bedtime 4)  Prevacid 30 Mg Cpdr (Lansoprazole) .... Take one tablet two times a day 5)  Metoclopramide Hcl 10 Mg Tabs (Metoclopramide hcl) .... One half  before each meal and at bedtime 6)  Hydrochlorothiazide 25 Mg Tabs (Hydrochlorothiazide) .... Take 1/2 tablet once a day 7)  Bystolic 10 Mg Tabs (Nebivolol hcl) .... Take 1 tablet by mouth every morning 8)  Synthroid 100 Mcg Tabs (Levothyroxine sodium) .... Take 1 tablet by mouth once a day 9)  Plavix 75 Mg Tabs (Clopidogrel bisulfate) .... Take 1 tablet by mouth once a day 10)  Citrucel Powd (Methylcellulose (laxative)) .Marland Kitchen.. 1 tbsp every morning 11)  Trazodone Hcl 100 Mg Tabs (Trazodone hcl) .... Take one tablet at bedtime 12)  Vitamin D 86578 Unit Caps (Ergocalciferol) .... Take 1 tablet twice a week on sundays and wednesdays 13)  Caltrate 600+d 600-400 Mg-unit Tabs (Calcium carbonate-vitamin d) .... Take one tablet two times a day 14)  Miralax Powd (Polyethylene glycol 3350) .Marland Kitchen.. 1 capful at bedtime 15)  Amlodipine Besylate 2.5 Mg Tabs (Amlodipine besylate) .Marland Kitchen.. 1 once daily 16)  Ed-chlor-tan 8 Mg Tabs (Chlorpheniramine tannate) .... One twice daily 17)  [facility]vs Saline Nasal Spray 0.65 % Soln (Saline) .... 2 puffs every 4 hours as needed 18)  Nitroquick 0.4 Mg Subl (Nitroglycerin) .Marland Kitchen.. 1 under tongue every 5 minutes x 3 as needed 19)  Zyrtec Allergy 10 Mg Tabs (Cetirizine hcl) .Marland Kitchen.. 1 daily as needed 20)  Delsym 30 Mg/20ml Lqcr (Dextromethorphan polistirex) .... 2 tsp every 12 hours as needed 21)  Vicodin 5-500 Mg Tabs (Hydrocodone-acetaminophen) .... Take 1 tablet by mouth every 4 hours as needed   Patient Instructions: 1)  Reduce your Reglan to one half  before each meal and at bedtime as long as cough stays controlled, if not, resume previous dose 2)  Please schedule a follow-up appointment in 3 months. 3)  See calendar  for specific medication instructions and bring it back for each and every office visit for every healthcare provider you see.  Without it,  you may not receive the best quality medical care that we feel you deserve.  4)  BRING THIS SHEET AND THE MED CALENDAR TO EACH VISIT   ]

## 2010-08-20 NOTE — Assessment & Plan Note (Signed)
Summary: med cal/apc   PCP:  Eric Form  Chief Complaint:  est med calendar.  History of Present Illness: 77 year old AAF  never smoker with a history of cough that dates back to 2004  that inconsistently responds to treatment directed at acid reflux.    March 15, 2008 ov doing  well and note that she is using the medication calendar now consistently which is the first time this is happened and correlates with excellent control of her symptoms.  Oct 30 saw bates with ha and sore throat, dx'd reflux, then worse the following day with sneezing and pain in face.  May 24, 2008 co  itching sneezing, minimal dry cough.--norvasc stopped, chlortrimeton rec.   June 07, 2008 -follow up and med review. doing well  07/17/08 worsening again with taper reglan, ran out of vicodin and didn't call for refills. not clear she really understands  how to use a calendar she carries although it is written in extremely user-friendly unambiguous fashion. convinced benicar is making her cough.--reglan increased four times a day   July 29, 2008 --returns for follow up. Cough is better. using delsym, chlortrimeton/ and hydrocodone. Rare use of vicodin over last week. Brought meds to visit, correct with list and recent changes. Denies chest pain, dyspnea, orthopnea, hemoptysis, fever, n/v/d, edema.           Prior Medications Reviewed Using: Medication Bottles  Updated Prior Medication List: KLOR-CON M20 20 MEQ  TBCR (POTASSIUM CHLORIDE CRYS CR) Take 1 tablet by mouth once a day BAYER ASPIRIN EC LOW DOSE 81 MG  TBEC (ASPIRIN) Take 1 tablet by mouth once a day ZEGERID 40-1100 MG CAPS (OMEPRAZOLE-SODIUM BICARBONATE) 1 two times a day METOCLOPRAMIDE HCL 10 MG  TABS (METOCLOPRAMIDE HCL) ONE HALF  before each meal and at bedtime HYDROCHLOROTHIAZIDE 25 MG  TABS (HYDROCHLOROTHIAZIDE) Take 1/2 tablet once a day BYSTOLIC 10 MG  TABS (NEBIVOLOL HCL) Take 1 tablet by mouth every morning SYNTHROID 112 MCG TABS  (LEVOTHYROXINE SODIUM) 1 by mouth once daily PLAVIX 75 MG  TABS (CLOPIDOGREL BISULFATE) Take 1 tablet by mouth once a day CITRUCEL   POWD (METHYLCELLULOSE (LAXATIVE)) 1 tbsp every morning TRAZODONE HCL 100 MG  TABS (TRAZODONE HCL) take one tablet at bedtime VITAMIN D 16109 UNIT  CAPS (ERGOCALCIFEROL) Take 1 tablet twice a week on Sundays and Wednesdays CALTRATE 600+D 600-400 MG-UNIT  TABS (CALCIUM CARBONATE-VITAMIN D) take one tablet two times a day MIRALAX   POWD (POLYETHYLENE GLYCOL 3350) 1 capful at bedtime BENICAR 40 MG TABS (OLMESARTAN MEDOXOMIL) 1 by mouth once daily VITAMIN C 500 MG TABS (ASCORBIC ACID) 1 once daily [facility] SALINE NASAL SPRAY 0.65 %  SOLN (SALINE) 2 puffs every 4 hours as needed NITROQUICK 0.4 MG  SUBL (NITROGLYCERIN) 1 under tongue every 5 minutes x 3 as needed ED-CHLOR-TAN 8 MG  TABS (CHLORPHENIRAMINE TANNATE) one twice daily DELSYM 30 MG/5ML  LQCR (DEXTROMETHORPHAN POLISTIREX) 2 tsp every 12 hours as needed VICODIN 5-500 MG  TABS (HYDROCODONE-ACETAMINOPHEN) Take 1 tablet by mouth every 4 hours as needed  Current Allergies (reviewed today): ! PREDNISONE ! * STATINS ! DOXYCYCLINE  Past Medical History:    COUGH (ICD-786.2)       -  onset 2003, pos response to acid reflux rx,         -  minimal sinus dz by ct 02/08/2006    HYSTERECTOMY, HX OF (ICD-V45.77)    APPENDECTOMY, HX OF (ICD-V45.79)    CHOLECYSTECTOMY, HX OF (ICD-V45.79)  DEPRESSION (ICD-311)    RHINITIS, ALLERGIC (ICD-477.9)    OSTEOARTHRITIS, MULTI SITES (ICD-715.98)    OBESITY, NOS (ICD-278.00)    INCONTINENCE, URGE (ICD-788.31)    HYPERTENSION, BENIGN SYSTEMIC (ICD-401.1)    HYPERLIPIDEMIA (ICD-272.4)    GASTROESOPHAGEAL REFLUX, NO ESOPHAGITIS (ICD-530.81)    DIVERTICULITIS OF COLON, NOS (ICD-562.11)    CORONARY, ARTERIOSCLEROSIS (ICD-414.00)    COLON POLYP (ICD-211.3)    ASTHMA, UNSPECIFIED (ICD-493.90)    COMPLEX MEDICAL REGIMEN       - Med calendar 09/26/07, Meds reviewed with pt education  and computerized med calendar completed/adjusted July 29, 2008            Family History:    Reviewed history from 09/15/2006 and no changes required:       Brother died at 67, Father died at 75, heart dz, Mother died at 31 - heart dz, RA, HTN, Sisters 16- living with RA, MI, Sisters 75- living with heart dz, SLE, HTN  Social History:    Reviewed history from 10/25/2007 and no changes required:       Never Married, son in [place].         Never smoked       Denies ETOH.         Able to do ADLs.  Ambulates with cane.  Attends church.  Unable to exercise due to knee pain. 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   Risk Factors: Tobacco use:  never  Colonoscopy History:    Date of Last Colonoscopy:  08/19/2004  Mammogram History:    Date of Last Mammogram:  08/20/2003   Review of Systems      See HPI   Vital Signs:  Patient Profile:   77 Years Old Female Height:     63 inches Weight:      187.38 pounds O2 Sat:      95 % O2 treatment:    Room Air Temp:     97.3 degrees F oral Pulse rate:   59 / minute BP sitting:   138 / 66  (left arm) Cuff size:   regular  Vitals Entered By: Boone Master CNA (July 29, 2008 2:15 PM)             Is Patient Diabetic? No Comments Medications reviewed with patient Boone Master CNA  July 29, 2008 2:15 PM      Physical Exam  In general,  minimally hoarse ambulatory black female talking a mile a minute with classic voice fatigue and throat clearing Afeb with normal vital signs wt  184  03/15/08 > 187 May 24, 2008  > 189 07/17/08 >>187 July 29, 2008 HEENT: only a few low teeth remaining, upper dentures mod non specific edema of both  turbinates, and orophanx. Nl external ear canals without cough reflex Neck without JVD/Nodes/TM Lungs clear to A and P bilaterally without cough on insp or exp maneuvers RRR no s3 or murmur or increase in P2 Abd soft and benign with nl excursion in the supine position. No  bruits or organomegaly Ext warm without calf tenderness, cyanosis clubbing or edema Skin warm and dry without lesions        Impression & Recommendations:  Problem # 1:  COUGH (ICD-786.2) Improved with reflux prevention.  Please contact office for sooner follow up if symptoms do not improve or worsen  Orders: Est. Patient Level II (16109)   Complete Medication List: 1)  Klor-con M20 20 Meq Tbcr (Potassium chloride crys cr) .Marland KitchenMarland KitchenMarland Kitchen  Take 1 tablet by mouth once a day 2)  Bayer Aspirin Ec Low Dose 81 Mg Tbec (Aspirin) .... Take 1 tablet by mouth once a day 3)  Zegerid 40-1100 Mg Caps (Omeprazole-sodium bicarbonate) .Marland Kitchen.. 1 two times a day 4)  Metoclopramide Hcl 10 Mg Tabs (Metoclopramide hcl) .... One half  before each meal and at bedtime 5)  Hydrochlorothiazide 25 Mg Tabs (Hydrochlorothiazide) .... Take 1/2 tablet once a day 6)  Bystolic 10 Mg Tabs (Nebivolol hcl) .... Take 1 tablet by mouth every morning 7)  Synthroid 112 Mcg Tabs (Levothyroxine sodium) .Marland Kitchen.. 1 by mouth once daily 8)  Plavix 75 Mg Tabs (Clopidogrel bisulfate) .... Take 1 tablet by mouth once a day 9)  Citrucel Powd (Methylcellulose (laxative)) .Marland Kitchen.. 1 tbsp every morning 10)  Trazodone Hcl 100 Mg Tabs (Trazodone hcl) .... Take one tablet at bedtime 11)  Vitamin D 60109 Unit Caps (Ergocalciferol) .... Take 1 tablet twice a week on sundays and wednesdays 12)  Caltrate 600+d 600-400 Mg-unit Tabs (Calcium carbonate-vitamin d) .... Take one tablet two times a day 13)  Miralax Powd (Polyethylene glycol 3350) .Marland Kitchen.. 1 capful at bedtime 14)  Benicar 40 Mg Tabs (Olmesartan medoxomil) .Marland Kitchen.. 1 by mouth once daily 15)  Vitamin C 500 Mg Tabs (Ascorbic acid) .Marland Kitchen.. 1 once daily 16)  Cvs Saline Nasal Spray 0.65 % Soln (Saline) .... 2 puffs every 4 hours as needed 17)  Nitroquick 0.4 Mg Subl (Nitroglycerin) .Marland Kitchen.. 1 under tongue every 5 minutes x 3 as needed 18)  Ed-chlor-tan 8 Mg Tabs (Chlorpheniramine tannate) .... One twice daily 19)  Delsym  30 Mg/27ml Lqcr (Dextromethorphan polistirex) .... 2 tsp every 12 hours as needed 20)  Vicodin 5-500 Mg Tabs (Hydrocodone-acetaminophen) .... Take 1 tablet by mouth every 4 hours as needed   Patient Instructions: 1)  Bring med list to each visit.  2)  conitnue on same meds 3)  follow your med list to help with cough as needed  4)  Please contact office for sooner follow up if symptoms do not improve or worsen  5)  follow up Dr. Sherene Sires 3 months.   Appended Document: meds update Medications Added TYLENOL EXTRA STRENGTH 500 MG TABS (ACETAMINOPHEN) per bottle          Clinical Lists Changes  Medications: Added new medication of TYLENOL EXTRA STRENGTH 500 MG TABS (ACETAMINOPHEN) per bottle - Signed

## 2010-08-20 NOTE — Medication Information (Signed)
Summary: Metoprolol/[facility]  Metoprolol/[facility]   Imported By: Lanelle Bal 08/26/2008 12:49:50  _____________________________________________________________________  External Attachment:    Type:   Image     Comment:   External Document

## 2010-08-20 NOTE — Assessment & Plan Note (Signed)
Summary: med cal/apc   PCP:  Eric Form  Chief Complaint:  med cal.  History of Present Illness: 77 year old AAF  never smoker with a history of cough that dates back to 2004  that inconsistently responds to treatment directed at acid reflux.    Last visit , was given prednisone rapid taper and Chlorpheniramine was added and she feels this does better than anything else when she remembers to take it per her as needed list. Pt denies any significant sore throat, nasal congestion or excess secretions, fever, chills, sweats, unintended wt loss, pleuritic or exertional cp, orthopnea pnd or leg swelling.  Pt also denies any obvious fluctuation in symptoms with weather or environmental change or other alleviating or aggravating factors.    Dec 07, 2007 doing better, no change in rx  March 15, 2008 ov doing  well and note that she is using the medication calendar now consistently which is the first time this is happened and correlates with excellent control of her symptoms.  Oct 30 saw bates with ha and sore throat, dx'd reflux, then worse the following day with sneezing and pain in face.  May 24, 2008 still with lots of itching sneezing, minimal dry cough.--norvasc stopped, chlortrimeton rec.   June 07, 2008 -follow up and med review. Doiing well, cough and drippy nose better. several meds changed recently. tolerating benicar well. Denies chest pain, dyspnea, orthopnea, hemoptysis, fever, n/v/d, edema, recent travel.         Updated Prior Medication List: KLOR-CON M20 20 MEQ  TBCR (POTASSIUM CHLORIDE CRYS CR) Take 1 tablet by mouth once a day BAYER ASPIRIN EC LOW DOSE 81 MG  TBEC (ASPIRIN) Take 1 tablet by mouth once a day PEPCID AC 10 MG  TABS (FAMOTIDINE) Take 1 tab by mouth at bedtime METOCLOPRAMIDE HCL 10 MG  TABS (METOCLOPRAMIDE HCL) ONE HALF  before each meal and at bedtime HYDROCHLOROTHIAZIDE 25 MG  TABS (HYDROCHLOROTHIAZIDE) Take 1/2 tablet once a day BYSTOLIC 10 MG  TABS  (NEBIVOLOL HCL) Take 1 tablet by mouth every morning SYNTHROID 112 MCG TABS (LEVOTHYROXINE SODIUM) 1 by mouth once daily PLAVIX 75 MG  TABS (CLOPIDOGREL BISULFATE) Take 1 tablet by mouth once a day CITRUCEL   POWD (METHYLCELLULOSE (LAXATIVE)) 1 tbsp every morning TRAZODONE HCL 100 MG  TABS (TRAZODONE HCL) take one tablet at bedtime VITAMIN D 16109 UNIT  CAPS (ERGOCALCIFEROL) Take 1 tablet twice a week on Sundays and Wednesdays CALTRATE 600+D 600-400 MG-UNIT  TABS (CALCIUM CARBONATE-VITAMIN D) take one tablet two times a day MIRALAX   POWD (POLYETHYLENE GLYCOL 3350) 1 capful at bedtime ZEGERID 40-1100 MG CAPS (OMEPRAZOLE-SODIUM BICARBONATE) 1 two times a day BENICAR 20mg   TABS (OLMESARTAN MEDOXOMIL) 2 by mouth once daily ED-CHLOR-TAN 8 MG  TABS (CHLORPHENIRAMINE TANNATE) one twice daily [facility] SALINE NASAL SPRAY 0.65 %  SOLN (SALINE) 2 puffs every 4 hours as needed NITROQUICK 0.4 MG  SUBL (NITROGLYCERIN) 1 under tongue every 5 minutes x 3 as needed DELSYM 30 MG/5ML  LQCR (DEXTROMETHORPHAN POLISTIREX) 2 tsp every 12 hours as needed VICODIN 5-500 MG  TABS (HYDROCODONE-ACETAMINOPHEN) Take 1 tablet by mouth every 4 hours as needed   Current Allergies: ! PREDNISONE ! * STATINS ! DOXYCYCLINE  Past Medical History:    Reviewed history from 03/15/2008 and no changes required:       COUGH (ICD-786.2)          -  onset 2003, pos response to acid reflux rx,            -  minimal sinus dz by ct 02/08/2006       HYSTERECTOMY, HX OF (ICD-V45.77)       APPENDECTOMY, HX OF (ICD-V45.79)       CHOLECYSTECTOMY, HX OF (ICD-V45.79)       DEPRESSION (ICD-311)       RHINITIS, ALLERGIC (ICD-477.9)       OSTEOARTHRITIS, MULTI SITES (ICD-715.98)       OBESITY, NOS (ICD-278.00)       INCONTINENCE, URGE (ICD-788.31)       HYPERTENSION, BENIGN SYSTEMIC (ICD-401.1)       HYPERLIPIDEMIA (ICD-272.4)       GASTROESOPHAGEAL REFLUX, NO ESOPHAGITIS (ICD-530.81)       DIVERTICULITIS OF COLON, NOS (ICD-562.11)        CORONARY, ARTERIOSCLEROSIS (ICD-414.00)       COLON POLYP (ICD-211.3)       ASTHMA, UNSPECIFIED (ICD-493.90)          Family History:    Reviewed history from 09/15/2006 and no changes required:       Brother died at 32, Father died at 78, heart dz, Mother died at 63 - heart dz, RA, HTN, Sisters 32- living with RA, MI, Sisters 40- living with heart dz, SLE, HTN  Social History:    Reviewed history from 10/25/2007 and no changes required:       Never Married, son in [place].         Never smoked       Denies ETOH.         Able to do ADLs.  Ambulates with cane.  Attends church.  Unable to exercise due to knee pain. 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   Risk Factors: Tobacco use:  never  Colonoscopy History:    Date of Last Colonoscopy:  08/19/2004  Mammogram History:    Date of Last Mammogram:  08/20/2003   Review of Systems      See HPI   Vital Signs:  Patient Profile:   78 Years Old Female Height:     63 inches Weight:      184.38 pounds O2 Sat:      95 % O2 treatment:    Room Air Temp:     99.4 degrees F oral Pulse rate:   76 / minute BP sitting:   134 / 82  (left arm) Cuff size:   regular  Vitals Entered By: Marijo File CMA (June 07, 2008 1:55 PM)             Is Patient Diabetic? No     Physical Exam  In general,  minimally hoarse ambulatory black female Afeb with normal vital signs wt  184  03/15/08 > 187 May 24, 2008  HEENT: only a few low teeth remaining, upper dentures mod non specific edema of both  turbinates, and orophanx. Nl external ear canals without cough reflex Neck without JVD/Nodes/TM Lungs clear to A and P bilaterally without cough on insp or exp maneuvers RRR no s3 or murmur or increase in P2 Abd soft and benign with nl excursion in the supine position. No bruits or organomegaly Ext warm without calf tenderness, cyanosis clubbing or edema Skin warm and dry without lesions        Impression &  Recommendations:  Problem # 1:  COUGH (ICD-786.2) improved with tx aimed at reflux and rhinitis prevention.  Meds reviewed with pt education and computerized med calendar completed/adjusted.   same meds. follow up 6-8 week Dr. Sherene Sires  Orders:  Est. Patient Level III (04540)   Problem # 2:  HYPERTENSION, BENIGN SYSTEMIC (ICD-401.1) controlled on rx .  Her updated medication list for this problem includes:    Hydrochlorothiazide 25 Mg Tabs (Hydrochlorothiazide) .Marland Kitchen... Take 1/2 tablet once a day    Bystolic 10 Mg Tabs (Nebivolol hcl) .Marland Kitchen... Take 1 tablet by mouth every morning    Benicar 40 Mg Tabs (Olmesartan medoxomil) .Marland Kitchen... 1 by mouth once daily  BP today: 134/82 Prior BP: 140/82 (05/24/2008)  Orders: Est. Patient Level III (98119)   Medications Added to Medication List This Visit: 1)  Benicar 40 Mg Tabs (Olmesartan medoxomil) .Marland Kitchen.. 1 by mouth once daily  Complete Medication List: 1)  Klor-con M20 20 Meq Tbcr (Potassium chloride crys cr) .... Take 1 tablet by mouth once a day 2)  Bayer Aspirin Ec Low Dose 81 Mg Tbec (Aspirin) .... Take 1 tablet by mouth once a day 3)  Pepcid Ac 10 Mg Tabs (Famotidine) .... Take 1 tab by mouth at bedtime 4)  Metoclopramide Hcl 10 Mg Tabs (Metoclopramide hcl) .... One half  before each meal and at bedtime 5)  Hydrochlorothiazide 25 Mg Tabs (Hydrochlorothiazide) .... Take 1/2 tablet once a day 6)  Bystolic 10 Mg Tabs (Nebivolol hcl) .... Take 1 tablet by mouth every morning 7)  Synthroid 112 Mcg Tabs (Levothyroxine sodium) .Marland Kitchen.. 1 by mouth once daily 8)  Plavix 75 Mg Tabs (Clopidogrel bisulfate) .... Take 1 tablet by mouth once a day 9)  Citrucel Powd (Methylcellulose (laxative)) .Marland Kitchen.. 1 tbsp every morning 10)  Trazodone Hcl 100 Mg Tabs (Trazodone hcl) .... Take one tablet at bedtime 11)  Vitamin D 14782 Unit Caps (Ergocalciferol) .... Take 1 tablet twice a week on "sundays and wednesdays 12)  Caltrate 600+d 600-400 Mg-unit Tabs (Calcium carbonate-vitamin  d) .... Take one tablet two times a day 13)  Miralax Powd (Polyethylene glycol 3350) .... 1 capful at bedtime 14)  Zegerid 40-1100 Mg Caps (Omeprazole-sodium bicarbonate) .... 1 two times a day 15)  Benicar 40 Mg Tabs (Olmesartan medoxomil) .... 1 by mouth once daily 16)  Ed-chlor-tan 8 Mg Tabs (Chlorpheniramine tannate) .... One twice daily 17)  [facility] Saline Nasal Spray 0.65 % Soln (Saline) .... 2 puffs every 4 hours as needed 18)  Nitroquick 0.4 Mg Subl (Nitroglycerin) .... 1 under tongue every 5 minutes x 3 as needed 19)  Delsym 30 Mg/5ml Lqcr (Dextromethorphan polistirex) .... 2 tsp every 12 hours as needed 20)  Vicodin 5-500 Mg Tabs (Hydrocodone-acetaminophen) .... Take 1 tablet by mouth every 4 hours as needed 21)  Prednisone 10 Mg Tabs (Prednisone) .... 4 each am x 2days, 2x2days, 1x2days and stop   Patient Instructions: 1)  Continue on Benicar 40mg once daily  2)  Bring med list back to each visit.  3)  Please contact office for sooner follow up if symptoms do not improve or worsen  4)  follow up 6-8 weeks Dr. Wert    Prescriptions: BENICAR 40 MG TABS (OLMESARTAN MEDOXOMIL) 1 by mouth once daily  #90 x 3   Entered and Authorized by:     NP   Signed by:     NP on 06/07/2008   Method used:   Electronically to        [facility] (retail)       [address]       [place], [place]  [address]       Ph: [phone]       Fax:  [phone]   RxID:   [id number]  ]  Appended Document: med cal/apc     Clinical Lists Changes  Medications: Removed medication of PREDNISONE 10 MG  TABS (PREDNISONE) 4 each am x 2days, 2x2days, 1x2days and stop Removed medication of PEPCID AC 10 MG  TABS (FAMOTIDINE) Take 1 tab by mouth at bedtime

## 2010-08-20 NOTE — Progress Notes (Signed)
Summary: RX REQ  Phone Note Call from Patient   Caller: Patient Call For: [facility] Summary of Call: PT WAITING ON RX OF BYSTOLIC 10 mg. [facility] [address] ASAP. (TRANSPORTATION ISSUES).  PATIENT'S CHART HAS BEEN REQUESTED Initial call taken by: Tivis Ringer,  August 11, 2007 12:46 PM  Follow-up for Phone Call        Sent rx for bystolic 10mg  electronically.  Called spoke with pt made aware rx done. Follow-up by: Cloyde Reams RN,  August 11, 2007 3:06 PM      Prescriptions: BYSTOLIC 10 MG  TABS (NEBIVOLOL HCL) Take 1 tablet by mouth every morning  #30 x 5   Entered by:   Cloyde Reams RN   Authorized by:   Nyoka Cowden MD   Signed by:   Cloyde Reams RN on 08/11/2007   Method used:   Electronically sent to ...       [facility]       [address]       [place], [place]  [id number]       Ph: [phone]       Fax: [phone]   RxID:   [id number]

## 2010-09-07 ENCOUNTER — Ambulatory Visit (INDEPENDENT_AMBULATORY_CARE_PROVIDER_SITE_OTHER): Payer: Medicare Other | Admitting: Cardiovascular Disease

## 2010-09-07 DIAGNOSIS — I119 Hypertensive heart disease without heart failure: Secondary | ICD-10-CM

## 2010-10-20 ENCOUNTER — Other Ambulatory Visit: Payer: Self-pay | Admitting: Oncology

## 2010-10-20 ENCOUNTER — Encounter (HOSPITAL_BASED_OUTPATIENT_CLINIC_OR_DEPARTMENT_OTHER): Payer: Medicare Other | Admitting: Oncology

## 2010-10-20 DIAGNOSIS — I10 Essential (primary) hypertension: Secondary | ICD-10-CM

## 2010-10-20 DIAGNOSIS — E785 Hyperlipidemia, unspecified: Secondary | ICD-10-CM

## 2010-10-20 DIAGNOSIS — D709 Neutropenia, unspecified: Secondary | ICD-10-CM

## 2010-10-20 DIAGNOSIS — D72819 Decreased white blood cell count, unspecified: Secondary | ICD-10-CM

## 2010-10-20 DIAGNOSIS — D649 Anemia, unspecified: Secondary | ICD-10-CM

## 2010-10-20 LAB — MORPHOLOGY: PLT EST: ADEQUATE

## 2010-10-20 LAB — CBC WITH DIFFERENTIAL/PLATELET
BASO%: 0.4 % (ref 0.0–2.0)
Basophils Absolute: 0 10*3/uL (ref 0.0–0.1)
EOS%: 1.2 % (ref 0.0–7.0)
Eosinophils Absolute: 0 10*3/uL (ref 0.0–0.5)
HCT: 36 % (ref 34.8–46.6)
HGB: 11.8 g/dL (ref 11.6–15.9)
LYMPH%: 48.1 % (ref 14.0–49.7)
MCH: 23.7 pg — ABNORMAL LOW (ref 25.1–34.0)
MCHC: 32.8 g/dL (ref 31.5–36.0)
MCV: 72.3 fL — ABNORMAL LOW (ref 79.5–101.0)
MONO#: 0.3 10*3/uL (ref 0.1–0.9)
MONO%: 11.6 % (ref 0.0–14.0)
NEUT#: 0.9 10*3/uL — ABNORMAL LOW (ref 1.5–6.5)
NEUT%: 38.7 % (ref 38.4–76.8)
Platelets: 182 10*3/uL (ref 145–400)
RBC: 4.98 10*6/uL (ref 3.70–5.45)
RDW: 15.1 % — ABNORMAL HIGH (ref 11.2–14.5)
WBC: 2.4 10*3/uL — ABNORMAL LOW (ref 3.9–10.3)
lymph#: 1.2 10*3/uL (ref 0.9–3.3)

## 2010-10-20 LAB — CHCC SMEAR

## 2010-10-21 LAB — COMPREHENSIVE METABOLIC PANEL
ALT: 18 U/L (ref 0–35)
AST: 27 U/L (ref 0–37)
Albumin: 4.1 g/dL (ref 3.5–5.2)
Alkaline Phosphatase: 87 U/L (ref 39–117)
BUN: 10 mg/dL (ref 6–23)
CO2: 33 mEq/L — ABNORMAL HIGH (ref 19–32)
Calcium: 9.8 mg/dL (ref 8.4–10.5)
Chloride: 101 mEq/L (ref 96–112)
Creatinine, Ser: 0.68 mg/dL (ref 0.4–1.2)
GFR calc non Af Amer: >60 mL/min (ref >60)
Glucose, Bld: 81 mg/dL (ref 70–99)
Potassium: 3.8 mEq/L (ref 3.5–5.1)
Sodium: 143 mEq/L (ref 135–145)
Total Bilirubin: 0.6 mg/dL (ref 0.3–1.2)
Total Protein: 7.6 g/dL (ref 6.0–8.3)

## 2010-10-21 LAB — URINALYSIS, ROUTINE W REFLEX MICROSCOPIC
Bilirubin Urine: NEGATIVE
Glucose, UA: NEGATIVE mg/dL
Hgb urine dipstick: NEGATIVE
Ketones, ur: NEGATIVE mg/dL
Nitrite: NEGATIVE
Protein, ur: NEGATIVE mg/dL
Specific Gravity, Urine: 1.011 (ref 1.005–1.030)
Urobilinogen, UA: 0.2 mg/dL (ref 0.0–1.0)
pH: 6.5 (ref 5.0–8.0)

## 2010-10-21 LAB — BASIC METABOLIC PANEL
BUN: 3 mg/dL — ABNORMAL LOW (ref 6–23)
CO2: 30 mEq/L (ref 19–32)
Calcium: 9 mg/dL (ref 8.4–10.5)
Chloride: 103 mEq/L (ref 96–112)
Creatinine, Ser: 0.74 mg/dL (ref 0.4–1.2)
GFR calc non Af Amer: >60 mL/min (ref >60)
Glucose, Bld: 103 mg/dL — ABNORMAL HIGH (ref 70–99)
Potassium: 3.9 mEq/L (ref 3.5–5.1)
Sodium: 138 mEq/L (ref 135–145)

## 2010-10-21 LAB — DIFFERENTIAL
Basophils Absolute: 0 10*3/uL (ref 0.0–0.1)
Basophils Relative: 1 % (ref 0–1)
Eosinophils Absolute: 0 10*3/uL (ref 0.0–0.7)
Eosinophils Relative: 1 % (ref 0–5)
Lymphocytes Relative: 40 % (ref 12–46)
Lymphs Abs: 1.3 10*3/uL (ref 0.7–4.0)
Monocytes Absolute: 0.3 10*3/uL (ref 0.1–1.0)
Monocytes Relative: 10 % (ref 3–12)
Neutro Abs: 1.7 10*3/uL (ref 1.7–7.7)
Neutrophils Relative %: 49 % (ref 43–77)

## 2010-10-21 LAB — CBC
HCT: 32.7 % — ABNORMAL LOW (ref 36.0–46.0)
HCT: 38.7 % (ref 36.0–46.0)
Hemoglobin: 10.4 g/dL — ABNORMAL LOW (ref 12.0–15.0)
Hemoglobin: 12.3 g/dL (ref 12.0–15.0)
MCHC: 31.9 g/dL (ref 30.0–36.0)
MCHC: 31.8 g/dL (ref 30.0–36.0)
MCV: 75.2 fL — ABNORMAL LOW (ref 78.0–100.0)
MCV: 75.6 fL — ABNORMAL LOW (ref 78.0–100.0)
Platelets: 166 10*3/uL (ref 150–400)
Platelets: 181 10*3/uL (ref 150–400)
RBC: 4.35 MIL/uL (ref 3.87–5.11)
RBC: 5.12 MIL/uL — ABNORMAL HIGH (ref 3.87–5.11)
RDW: 15.3 % (ref 11.5–15.5)
RDW: 14.3 % (ref 11.5–15.5)
WBC: 2.9 10*3/uL — ABNORMAL LOW (ref 4.0–10.5)
WBC: 3.3 10*3/uL — ABNORMAL LOW (ref 4.0–10.5)

## 2010-10-22 LAB — PROTEIN ELECTROPHORESIS, SERUM
Albumin ELP: 59.8 % (ref 55.8–66.1)
Alpha-1-Globulin: 4.4 % (ref 2.9–4.9)
Alpha-2-Globulin: 8.6 % (ref 7.1–11.8)
Beta 2: 4.5 % (ref 3.2–6.5)
Beta Globulin: 5 % (ref 4.7–7.2)
Gamma Globulin: 17.7 % (ref 11.1–18.8)
Total Protein, Serum Electrophoresis: 6.5 g/dL (ref 6.0–8.3)

## 2010-10-22 LAB — COMPREHENSIVE METABOLIC PANEL
ALT: 15 U/L (ref 0–35)
AST: 23 U/L (ref 0–37)
Albumin: 4.2 g/dL (ref 3.5–5.2)
Alkaline Phosphatase: 77 U/L (ref 39–117)
BUN: 11 mg/dL (ref 6–23)
CO2: 26 mEq/L (ref 19–32)
Calcium: 9.1 mg/dL (ref 8.4–10.5)
Chloride: 100 mEq/L (ref 96–112)
Creatinine, Ser: 0.92 mg/dL (ref 0.40–1.20)
Glucose, Bld: 106 mg/dL — ABNORMAL HIGH (ref 70–99)
Potassium: 3.8 mEq/L (ref 3.5–5.3)
Sodium: 138 mEq/L (ref 135–145)
Total Bilirubin: 0.3 mg/dL (ref 0.3–1.2)
Total Protein: 6.5 g/dL (ref 6.0–8.3)

## 2010-10-22 LAB — HIV ANTIBODY (ROUTINE TESTING W REFLEX): HIV: NONREACTIVE

## 2010-10-22 LAB — FOLATE: Folate: 18.5 ng/mL

## 2010-10-22 LAB — ANA: Anti Nuclear Antibody(ANA): NEGATIVE

## 2010-10-22 LAB — METHYLMALONIC ACID, SERUM: Methylmalonic Acid, Quantitative: 288 nmol/L (ref 87–318)

## 2010-10-22 LAB — VITAMIN B12: Vitamin B-12: 855 pg/mL (ref 211–911)

## 2010-12-01 NOTE — Op Note (Signed)
NAME:  [name], [name]            ACCOUNT NO.:  [id number]   MEDICAL RECORD NO.:  [id number]          PATIENT TYPE:  AMB   LOCATION:  [facility]                         FACILITY:  [facility]   PHYSICIAN:  Jamison Neighbor, M.D.  DATE OF BIRTH:  [birth year]   DATE OF PROCEDURE:  05/16/2007  DATE OF DISCHARGE:                               OPERATIVE REPORT   PREOPERATIVE DIAGNOSIS:  Bladder and pelvic pain, rule out interstitial  cystitis.   POSTOPERATIVE DIAGNOSIS:  Bladder and pelvic pain, rule out interstitial  cystitis.   PROCEDURE:  Cystoscopy, urethral calibration, hydrodistention of the  bladder, Marcaine and Pyridium installation, Marcaine and Kenalog  injection.   SURGEON:  Jamison Neighbor, M.D.   ANESTHESIA:  General.   COMPLICATIONS:  None.   DRAINS:  None.   BRIEF HISTORY:  This 77 year old female is referred for evaluation of  chronic pelvic pain.  She has not had documented infections or  hematuria.  She has undergone a cystoscopic evaluation in the office  which was unremarkable and a CT scan to evaluate her pelvis which was  also normal.  The patient was found to have some degree of atrophic  vaginitis, a urethral caruncle and has been treated with Estrace cream.  She was also given anticholinergic therapy for her urinary frequency and  incontinence.  Urodynamic studies showed a normal bladder capacity with  very little on that study that elucidated a source for the patient's  sensations.  The patient was empirically given a combination of Elmiron  and Elavil because of problems with pelvic pain but she has had very  little improvement.  She is now to undergo formal diagnostic cystoscopy  to evaluate the situation.  The patient understands the risks and  benefits of the procedure and gave full informed consent.   PROCEDURE:  After successful induction of general anesthesia the patient  was placed in the dorsal position, prepped with Betadine and draped in  usual sterile  fashion.  Careful bimanual examination revealed a small  introitus with surgical changes following an old hysterectomy.  She did  not have vault prolapse or cystocele, rectocele or enterocele.  The  length the vagina was significantly shortened.  She did have a urethral  caruncle with no other irregularities of the urethra specifically no  signs of urethral diverticulum.  The urethra was calibrated to 32-French  with female urethral sounds.  The cystoscope was inserted.  The bladder  was carefully inspected.  No tumors or stones could be seen.  There was  minimal trabeculation of bladder which was otherwise unremarkable.  The  ureters were normal in the urine coming out of each was clear.  The  bladder was then distended at a pressure of 100 cm of water for five  minutes.  When the bladder was drained minimal glomerulations could be  seen bladder capacity was normal at 1300 mL and certainly is felt not to  be consistent with interstitial cystitis diagnosis.  The patient had  nothing that required biopsy, specifically no lesions of any kind could  be identified.  The bladder was drained.  A mixture  of Marcaine and  Pyridium was left in the bladder.  Marcaine and Kenalog were injected  periurethrally.  The patient tolerated the procedure well was taken to  the recovery room in good condition.  She received intraoperative  Toradol, Zofran and B&O suppository for symptom management.  She will be  sent home on Lorcet Plus, Pyridium Plus and doxycycline and return to  see me in follow-up.  At that point we will discuss these findings with  her and it  certainly indicated that this does not appear to be consistent with  interstitial cystitis.  She is likely to require empiric therapy of her  bladder and pelvic pain with additional evaluation by her gynecologist,  possible be necessary if the pain is persistent.      Jamison Neighbor, M.D.  Electronically Signed     RJE/MEDQ  D:   05/16/2007  T:  05/16/2007  Job:  [id number]

## 2010-12-01 NOTE — Assessment & Plan Note (Signed)
[facility]                         GASTROENTEROLOGY OFFICE NOTE   NAME:[name], [name]                   MRN:          [id number]  DATE:05/04/2007                            DOB:          Sep 23, 1931    PROBLEMS:  Abdominal discomfort and constipation.   HISTORY:  This is a pleasant 77 year old African-American female who has  a history of chronic GERD and fairly chronic complaints of abdominal  pain.  She also has a history of coronary artery disease, hypertension,  diverticular disease, depression.  She is status post cholecystectomy,  hysterectomy, and appendectomy.  She was last seen here in November,  2007, at that time with left lower quadrant discomfort, gas, bloating,  and intermittent loose stools.  She had colonoscopy in 2006 by Dr. Reece Agar, had one hyperplastic polyp, and left colon diverticular disease  noted.  She also has known chronic mild extra-hepatic biliary dilation  post cholecystectomy.  She has undergone recent evaluation with Dr. Clelia Croft  for ongoing complaints of abdominal pain.  Did have a CT scan of the  abdomen and pelvis done in May, 2008 as well as a gastric emptying scan.  The gastric emptying scan was normal.  CT of the abdomen and pelvis  showed intra- and extra-hepatic ductal dilation, status post  cholecystectomy, pancreatic duct not dilated, two miniscule  hypodensities in the liver, too small to characterize, probably  reflecting cysts.  A 7 mm low density lesion in the right kidney, too  small to characterize, fecal stasis.   Patient says that she has been seen by a urologist recently as well for  problems with her bladder but has been having increasing problems over  the past several months with constipation and a sensation of incomplete  bowel evacuation.  She says she follows a very high-fiber diet, has been  taking Citrucel twice a day in addition to that, and has MiraLax at home  that she uses p.r.n.   Usually, she is having a bowel movement about  every other day with a lot of straining.  She says that she feels  bloated and uncomfortable and has had some left-sided abdominal  discomfort off and on over the past 8-9 years.  She also mentions that  she occasionally sees a small amount of bright red blood on the tissue  but has never seen blood mixed in with her bowel movements.   CURRENT MEDICATIONS:  1. Potassium chloride 20 mEq daily.  2. Aspirin 325 daily.  3. HCTZ 25 daily.  4. Atenolol 100 daily.  5. Plavix 75 daily.  6. Norvasc 2.5 daily.  7. Trazodone 100 nightly.  8. Prevacid 30 b.i.d.  9. Synthroid 75 mcg daily.  10.Calcium supplement daily.  11.Vitamin D b.i.d.  12.Multivitamin daily.  13.Tramadol p.r.n.  14.Loratidine 10 daily.   ALLERGIES/INTOLERANCES:  STATINS, PREDNISONE with itching and rash.   PHYSICAL EXAMINATION:  A well-developed, elderly African-American female  in no acute distress.  Weight is 168.  Blood pressure 112/76, pulse in the 60s.  CARDIOVASCULAR:  Regular rate and rhythm with S1 and S2.  No murmur, rub  or gallop.  ABDOMEN:  Soft.  She is minimally tender in the left lower quadrant and  suprapubic area.  No guarding or rebound.  No palpable mass or  hepatosplenomegaly.  Bowel sounds are present.  RECTAL:  Hemoccult negative.  She does have small external hemorrhoidal  tags.   IMPRESSION:  A 77 year old African-American female with chronic  abdominal discomfort and constipation, probably manifestation of  constipation predominant irritable bowel syndrome, and/or symptomatic  diverticular disease.   PLAN:  1. Start MiraLax daily 17 gm in 8 ounces of fluids.  Decrease Citrucel      to once-daily dosing.  2. Trial of Xifaxan 400 mg t.i.d. x10 days for potential bacterial      overgrowth contributing to above.  3. Align 1 p.o. daily x30 days.  4. Return office visit in 4-6 weeks.      Mike Gip, PA-C  Electronically Signed       Venita Lick. Russella Dar, MD, [facility]  Electronically Signed   AE/MedQ  DD: 05/04/2007  DT: 05/05/2007  Job #: [id number]   cc:   Kari Baars, M.D.

## 2010-12-01 NOTE — H&P (Signed)
NAME:  [name], [name]            ACCOUNT NO.:  [id number]   MEDICAL RECORD NO.:  [id number]          PATIENT TYPE:  INP   LOCATION:  3706                         FACILITY:  [facility]   PHYSICIAN:  Peter M. Swaziland, M.D.  DATE OF BIRTH:  May 01, 1932   DATE OF ADMISSION:  07/07/2007  DATE OF DISCHARGE:  07/08/2007                              HISTORY & PHYSICAL   HISTORY OF PRESENT ILLNESS:  Ms. [name] 77 year old black female with  history of the small apical infarction in April 2005, was felt to be  probably embolic.  She presents with acute chest pain today.  She states  pain began in her left axilla and then radiated to her chest and jaw,  was associated with some shortness of breath.  She took two old  sublingual nitroglycerin at home without relief.  She called EMS, and  after receiving aspirin and sublingual nitroglycerin ambulance, she had  moderate pain relief.  Altogether, her pain lasted approximately 2  hours, and then her anterior chest pain and jaw pain resolved.  She  still has a low grade left axillary pain.  Of note, the patient did have  a cardiac catheterization April 2007, which was essentially normal.   PAST MEDICAL HISTORY:  1. Chronic cough, seen and evaluated by Dr. Sherene Sires.  2. Chronic abdominal pain with irritable bowel syndrome.  3. History of gastric polyps.  4. Hypertension.  5. Hyperlipidemia.  6. Arthritis.  7. Remote myocardial infarction.   ALLERGIES:  LIPITOR.   CURRENT MEDICATIONS:  1. Potassium 20 mEq daily.  2. Aspirin 325 mg daily.  3. Amlodipine 5 mg per day.  4. Prevacid 30 mg b.i.d.  5. Reglan 10 mg a.c. at bedtime.  6. HCTZ 25 mg per day.  7. Bystolic 5 mg per day.  8. Synthroid 100 mcg daily.  9. Plavix 75 mg daily.  10.Cymbalta 30 mg daily.  11.Trazodone 100 mg q.h.s.  12.Vitamin D five 50,000 units twice weekly.  13.Caltrate one b.i.d.  14.MiraLax one cap full daily.  15.Citrucel 1 tablespoon daily.   SOCIAL HISTORY:  The patient is  single.  She has one son.  She denies  tobacco or alcohol use.   FAMILY HISTORY:  Her family history is noncontributory.   REVIEW OF SYSTEMS:  Otherwise unremarkable.   PHYSICAL EXAM:  GENERAL:  The patient elderly black female in no  distress.  VITAL SIGNS:  Blood pressure 136/80, pulse is 67 regular, respirations  were 20.  She is afebrile.  Sats were normal.  HEENT:  Exam was unremarkable.  NECK:  Supple without JVD, adenopathy, thyromegaly or bruits.  LUNGS:  Clear.  CARDIAC:  Exam reveals regular rate and rhythm without gallop, murmur,  rub or click.  She has mild chest wall pain to palpation of  the left  axilla.  ABDOMEN:  Soft, nontender without masses or bruits.  She has no edema or  phlebitis.  Pedal pulses are 2+ and symmetric.   LABORATORY DATA:  Sodium is 130, potassium 3.6, chloride 98, CO2 25, BUN  11, creatinine 0.7, glucose of 98, point of care cardiac enzymes are  negative x3.  Chest X-Ray: Shows no active disease.  ECG shows no acute  change.   IMPRESSION:  1. Chest pain with atypical features.  Need to rule out myocardial      infarction.  2. Hypertension.  3. Hyperlipidemia.  4. History of gastric polyps.  5. Chronic cough.   PLAN:  Will admit to telemetry and rule out for myocardial infarction.  She will be treated with subcu Lovenox and otherwise continue her  current medications.  If she rules out by cardiac enzymes, then she can  probably be discharged to home tomorrow.           ______________________________  Peter M. [place]il[place], M.D.     PMJ/MEDQ  D:  07/07/2007  T:  07/09/2007  Job:  [id number]   cc:   [name]. Sherene Sires, MD, FCCP  Vesta Mixer, M.D.

## 2010-12-01 NOTE — Assessment & Plan Note (Signed)
[facility]                         GASTROENTEROLOGY OFFICE NOTE   NAME:[name], [name] TOREN                   MRN:          [id number]  DATE:08/04/2007                            DOB:          1932-02-13    Ms. [name] is 77 year old African-American female whom I have previously  evaluated for GERD and constipation.  Please see the office note dated  May 04, 2007.  She has had recurrent problems with chest pain  associated with a cough.  Her pain occasionally radiates to her right  arm.  She was seen in the emergency room on July 30, 2007 with  recurrent chest pain.  She was previously hospitalized from July 07, 2007 through July 08, 2007 for chest pain.  Myocardial infarction  was ruled out.  The discharge summary relates a history of a prior  cardiac catheterization in 2007 that was unremarkable.  She also  complains of ongoing difficulties with constipation.  She had previously  underwent upper endoscopy in October 1999 for atypical chest pain.  The  endoscopy was normal at that time.  I was concerned about  musculoskeletal and psychological factors contributing to her chest pain  in 1999.  In the examination room today she developed recurrent chest  pain at rest with pain in her right arm, and she took NitroQuick x2 with  substantial improvement in her symptoms.   CURRENT MEDICATIONS:  Listed on the chart, updated and reviewed.   MEDICATION ALLERGIES:  1. PREDNISONE.  2. DOXYCYCLINE.   PHYSICAL EXAMINATION:  Very anxious, sitting on the exam table  Weight 172.8.  Blood pressure 188/98 left arm, 186/94 right arm.  Pulse  64 and regular.  HEENT EXAM:  Anicteric sclerae.  Oropharynx clear.  CHEST:  Clear to auscultation bilaterally with no chest wall tenderness  appreciated.  CARDIAC:  Regular rate and rhythm without murmurs appreciated.  ABDOMEN:  Soft, nontender, non-distended.  Normoactive bowel sounds.  No  palpable  organomegaly, masses or hernias.  EXTREMITIES:  Without clubbing, cyanosis, or edema.  NEUROLOGIC:  Alert and oriented x3.  Extremely anxious, otherwise  nonfocal.   ASSESSMENT AND PLAN:  1. Chest pain and pain radiating to the right arm.  Anxiety.  Dr.      Melburn Popper feels her pain is not cardiac related.  She has seen Dr.      Sherene Sires for a chronic cough.  I feel her chest pain symptoms are      probably not gastrointestinal in etiology but GERD and other      esophageal etiologies should be further excluded. I have advised      proceeding with upper endoscopy, and then possibly esophageal      manometry for further evaluation.  I have strongly advised that she      go to the emergency room now for futher evaluation of her chest      pain and right arm pain, and she refuses.  A family member will      come to the office to take her home, and I have advised them to      contact Dr. Clelia Croft  or Dr. Melburn Popper if her chest pain returns.  Her      anxiety is a likely factor in her chest pain and this will need      further management by Dr. Clelia Croft.  2. Chronic constipation.  She is to increase MiraLax to t.i.d., and      she may decrease to daily, or b.i.d. to titrate for adequate bowel      movements.  She is to increase her fiber and fluid intake.  Risks,      benefits, and alternatives to colonoscopy with possibly biopsy and      possibly polypectomy discussed with the patient, and she consents      to proceed.  This will be scheduled electively at the time of her      upper endoscopy.     Venita Lick. Russella Dar, MD, [facility]  Electronically Signed    MTS/MedQ  DD: 08/08/2007  DT: 08/08/2007  Job #: [id number]   cc:   Kari Baars, M.D.

## 2010-12-01 NOTE — Op Note (Signed)
NAME:  [name], [name]            ACCOUNT NO.:  [id number]   MEDICAL RECORD NO.:  [id number]          PATIENT TYPE:  AMB   LOCATION:  DAY                          FACILITY:  [facility]   PHYSICIAN:  Jamison Neighbor, M.D.  DATE OF BIRTH:  1931/11/09   DATE OF PROCEDURE:  10/03/2007  DATE OF DISCHARGE:                               OPERATIVE REPORT   PREOPERATIVE DIAGNOSIS:  Stress urinary incontinence.   POSTOPERATIVE DIAGNOSES:  1. Stress urinary incontinence.  2. Urethral caruncle.   PROCEDURE:  Cystoscopy, transobturator, pubovaginal sling, and excision  of urethra caruncle.   SURGEON:  Jamison Neighbor, M.D.   ANESTHESIA:  General.   COMPLICATIONS:  Bleeding from urethra caruncle.   DRAINS:  A 14-French Foley catheter to a leg bag.   HISTORY:  This 77 year old female has a very complex urologic history.  The patient has complained for quite some time of frequency, urgency, as  well as what was first thought to be a mixed type of urinary  incontinence.  There was also some question about pelvic floor  dysfunction.  She has undergone evaluation for interstitial cystitis  with a generally unremarkable evaluation.  She does have some problems  with bladder instability, but interestingly, even with detrusor  contraction, she did not have leakage on her urodynamic study.  The  patient was quite adamant that she does not leak with spasms and she  empties her bladder normally but that her biggest problem is stress  incontinence.  There was evidence of stress incontinence, although there  was a relatively high leak point pressure on the urodynamic study.   Based on her symptoms, we felt that fixing the stress incontinence made  the most sense.  We did stress to her that she may have some problems  with postoperative urgency and that medications for the urgency and the  detrusor instability may be necessary.  She gave full informed consent.   PROCEDURE:  After successful induction of  general anesthesia, the  patient was placed in the dorsal lithotomy position, prepped with  Betadine, and draped in the usual sterile fashion.   Inspection showed a very narrow introitus, and we could not get a  standard vaginal speculum placed, but a Deaver retractor was utilized.  Inspection of the vaginal vault showed no bleeding, except for from her  urethral caruncle.  The caruncle at present is much worse than had been  noted previously with significant prolapse, and it was felt that this  should be addressed.  With the Foley catheter in place, a  circumferential incision was made, and the caruncle itself was removed.  The mucosa was advanced and then closed with a series of 4-0 Vicryl  sutures.   Attention then was directed to the stress incontinence.  The anterior  vaginal mucosa was infiltrated with local anesthetic.  An incision was  made directly over the mid urethra.  Flaps were developed bilaterally,  and extension went back to but not through the endopelvic fascia.  Two  stab incisions were made in the groin crease at the level of the  clitoris.  The curved  needle for the [facility] system was passed from the  groin crease into the vaginal opening.  Care was taken to ensure that  this did not enter the vagina posteriorly or laterally.  Cystoscopy was  performed with the 70-degree lens.  The bladder was inspected.  There  was no injury to the bladder of any kind.  The bladder was not over-  distended.  The sling was then positioned and attached to the needles  which were then pulled back to the groin incisions.  The sling tension  was set so that there was no leakage with a full bladder.  With a Crede  maneuver, there was a prompt straight flow of urine.  The urethra seemed  to be in a neutral position with good coaptation.  There was no  excessive angulation.  A right-angle clamp could easily be passed  between the urethra and the sling.  The protective sheath for the sling   was then cut away, setting the tension.  The area was irrigated and then  closed with 2-0 Vicryl.  Packing was applied.   The patient tolerated the procedure and was taken to the recovery room  in good condition.  She will be sent home with the catheter in place to  allow that urethral caruncle site to heal.  She tolerated the procedure  was taken to the recovery room in good condition.      Jamison Neighbor, M.D.  Electronically Signed     RJE/MEDQ  D:  10/03/2007  T:  10/03/2007  Job:  [id number]

## 2010-12-01 NOTE — Discharge Summary (Signed)
NAME:  [name], [name]            ACCOUNT NO.:  [id number]   MEDICAL RECORD NO.:  [id number]          PATIENT TYPE:  INP   LOCATION:  3706                         FACILITY:  [facility]   PHYSICIAN:  Vesta Mixer, M.D. DATE OF BIRTH:  09-Jul-1932   DATE OF ADMISSION:  07/07/2007  DATE OF DISCHARGE:  07/08/2007                               DISCHARGE SUMMARY   DISCHARGE DIAGNOSES:  1. Chest pain - noncardiac.  2. History of gastrointestinal chest pain.  3. Hypertension.  4. Hypothyroidism.  5. Hyperlipidemia.  6. Chronic pain.  7. Arthritis.   DISCHARGE MEDICATIONS:  1. Potassium chloride 20 mg a day.  2. Aspirin 81 mg a day.  3. Norvasc 5 mg a day.  4. Prevacid 30 mg twice a day before meals.  5. Reglan 10 mg four times a day as directed by her other medical      doctors.  6. HCTZ 25 mg.  7. Bistolic 5 mg a day.  8. Synthroid 100 mcg a day.  9. Plavix 75 mg a day.  10.Cymbalta 60 mg a day.  11.Trazodone 9 mg q.h.s.  12.Vitamin D 50,000 units on Sundays and Wednesdays.  13.Caltrate twice a day.  14.MiraLax at bedtime.  15.Zyrtec 10 mg a day.  16.Vicodin 5 grams/5 mg as needed.  17.Citrucel one  tablespoon a day.   DISPOSITION:  The patient will see Dr. Elease Hashimoto in several weeks.  She is  to see her general medical doctors also as needed.   HISTORY:  [name] is a 77 year old female who is admitted to the  emergency room with episodes of chest pain.  Please see dictated H&P for  further details.   HOSPITAL COURSE:  #1 - CHEST PAIN.  The patient ruled out for myocardial  infarction.  EKG remained completely normal.  She had a heart  catheterization last year which revealed smooth and normal coronary  arteries.  She had a history of what appears to be an apical wall myocardial  infarction caused by transient thrombosis or thromboembolus down her  LAD.  She has not had any symptoms similar to that.  She will follow up  with Dr. Elease Hashimoto in several weeks.  All of her other  medical problems  remained stable.           ______________________________  Vesta Mixer, M.D.     PJN/MEDQ  D:  07/08/2007  T:  07/10/2007  Job:  [id number]   cc:   [name]. Sherene Sires, MD, FCCP  Malcolm T. Russella Dar, MD, [facility]

## 2010-12-01 NOTE — Assessment & Plan Note (Signed)
[facility]                             PULMONARY OFFICE NOTE   NAME:[name], [name] SCREWS                   MRN:          [id number]  DATE:06/20/2007                            DOB:          Aug 23, 1931    PULMONARY/ACUTE EXTENDED OFFICE VISIT   HISTORY:  A 77 year old black female with severe chronic cough dating  back to 2003 that had resolved nicely each time we treated her  aggressively for reflux dating back to 2004. For reasons that are not  clear to me, she continues to modify her reflux regimen and then  develops more cough and comes back to the [facility]monary [facility] for  reevaluation. This cough has been going on for four months, much worse  during the day than night. I understand she was taken off of Reglan  because it was felt it might be contributing to chronic abdominal pain,  but she says that made no difference.  Unfortunately, she does not  remember the timing between when she stopped the Reglan and when her  cough started again. However, she denies any excessive sputum  production, fevers, chills, sweats, orthopnea, PND or leg swelling.   I attempted to review her medications with her but she has reverted back  to a bag of pills approach to her medical care which makes it very  confusing as to what she takes and when she takes it.  I did inventory  the medications as I understand them in the column dated June 20, 2007. Note, the patient has lost the previous medication calendar that  was made for her by our nurse practitioner which presented a user  friendly and unambiguous self administration of medication.   PHYSICAL EXAMINATION:  She is a depressed appearing ambulatory, hoarse  white female in no acute distress. She has stable vital signs.  HEENT: Is unremarkable. Oropharynx is clear.  Nasal turbinates are  normal. Ear canals are clear bilaterally.  NECK: Supple without cervical adenopathy or tenderness. Trachea is  midline without  lymphadenopathy.  LUNGS: Lung fields are completely clear bilaterally except for minimal  pseudo-wheeze.  HEART: Regular rate and rhythm without murmur, gallop or rub.  ABDOMEN: Soft, benign.  EXTREMITIES: Warm without calf tenderness, cyanosis, clubbing or edema.   IMPRESSION:  Recurrent flare up of cough each time responds to treatment  directed at reflux and cyclical coughing and then resolves for months at  a time. This has been a recurrent pattern that I have seen in patient's  with reflux as a major element because whereas everyone has reflux to  some extent, not everyone who refluxes however coughs. In her case, she  develops a cyclical phenomena where the more she coughs, the more she  traumatizes the airway and the more she refluxes.   To try to eliminate this cycle, I have recommended adding back her  Reglan in the short run at 10 mg before meals and at bedtime, taking a  six day course of prednisone.   In terms of her migratory abdominal pain, I would like her stop this  calcium for now and continue to take  Citrucel a teaspoon b.i.d. until  her next visit with Dr. Russella Dar.   To eliminate cycle of cough induced cough, I have recommended Vicodin  one q4 p.r.n., but I gave her only a limited supply. Chart review does  indicate that she is on moderate doses of atenolol which probably should  be switched over to bisoprolol if the cough continues because the  possibility of cough variant (although in the past this has not been an  issue, it certainly could be eliminated be simply switching to Bystolic  5 mg b.i.d.).   I have offered the service of a nurse practitioner if she will return in  a week for followup with all of her medications on hand and explain why  that is so important. Once she has a new calendar, I would like to show  it to every doctor she sees for modification as needed during the office  visit so that we are all reading from the same page in terms of her   medical treatment plan.     [name]. Sherene Sires, MD, [facility]  Electronically Signed    MBW/MedQ  DD: 06/20/2007  DT: 06/20/2007  Job #: [id number]   cc:   Kari Baars, M.D.  Venita Lick. Russella Dar, MD, [facility]

## 2010-12-04 NOTE — H&P (Signed)
NAME:  [name], [name]            ACCOUNT NO.:  [id number]   MEDICAL RECORD NO.:  [id number]            PATIENT TYPE:   LOCATION:                                 FACILITY:   PHYSICIAN:  Vesta Mixer, M.D.      DATE OF BIRTH:   DATE OF ADMISSION:  10/29/2005  DATE OF DISCHARGE:                                HISTORY & PHYSICAL   [name] is an elderly female with a history of chest pain. She also has a  history of small apical myocardial infarction.  She is admitted for heart  catheterization after having worsening episodes of chest pain and after  having an abnormal Cardiolite study.   [name] has a history of a small apical myocardial infarction.  This was  caused seemingly by embolus down the posterior descending artery.  Her  remaining coronary arteries are largely unremarkable.  She recently has been  having some increased episodes of chest pain.  She had an adenosine  Cardiolite study which revealed and apical defect.  She had fairly well  preserved left ventricular function with preserved contractility in the  apex.  Because of these abnormalities and because of her persistent chest  pain, she is scheduled now for heart catheterization.   She denies any problems with her eyes, ears, nose, or throat. She denies any  worsening of her chest pain with movement, eating, change of position.  She  does state that the __________ is also not necessarily associated with  exercise.  She denies any PND or orthopnea.  She denies any syncope or  presyncope or easy bruisability.   CURRENT MEDICATIONS:  1.  Atenolol 50 mg a day.  2.  Trazodone 100 mg nightly.  3.  Enteric-coated aspirin 81 mg a day.  4.  Hydrochlorothiazide 12.5 mg a day.  5.  Vesicare 40 mg a day.  6.  Zetia 10 mg a day.  7.  Norvasc 2.5 mg a day.  8.  Plavix 75 mg a day.  9.  Protonix 40 mg a day.  10. Imdur 15 mg a day.  11. Cardura 1 mg nightly.  12. Potassium chloride 10 mEq a day.   PHYSICAL  EXAMINATION:  GENERAL:  Elderly female in no acute distress.  She  is alert and oriented x3, and her mood and affect are normal.  VITAL SIGNS: Weight is 171.  Blood pressure 148/90, heart rate 64.  HEENT:  Examination reveals 2+ carotids.  She has no bruits, no JVD,  no  thyromegaly.  LUNGS: Clear to auscultation.  HEART: Regular rate, S1, S2.  No murmurs.  ABDOMEN:  Exam reveals good bowel sounds and is nontender.  EXTREMITIES:  She has no clubbing, cyanosis, or edema.  NEUROLOGIC:  Exam is nonfocal.   IMPRESSION AND PLAN:  [name] presents with persistent episodes of chest  pain.  She has a history of myocardial infarction in the past.  I would like  to proceed with heart catheterization. We have discussed the risks,  benefits, and options of heart catheterization.  She understands and agrees  to proceed.  Her blood pressure remains mildly elevated.  I would like to increase her  Norvasc to 2.5 mg a day. We will consider increasing her Cardura if her  blood pressure does not improve.  We will see her back in several weeks.           ______________________________  Vesta Mixer, M.D.     PJN/MEDQ  D:  10/19/2005  T:  10/19/2005  Job:  [id number]   cc:   Kari Baars, M.D.  Fax: [phone]

## 2010-12-04 NOTE — Cardiovascular Report (Signed)
NAME:  [name], [name]                      ACCOUNT NO.:  [id number]   MEDICAL RECORD NO.:  [id number]                   PATIENT TYPE:  INP   LOCATION:  2019                                 FACILITY:  [facility]   PHYSICIAN:  Vesta Mixer, M.D.              DATE OF BIRTH:  [birth year]   DATE OF PROCEDURE:  11/04/2003  DATE OF DISCHARGE:                              CARDIAC CATHETERIZATION   [name] is a 77 year old female with a longstanding history of  hypertension.  She was admitted to the hospital on April 16 with some  atypical episodes of chest pain and arm pain.  She ruled out for myocardial  infarction.  She was scheduled for a stress Cardiolite study earlier today,  but started having chest pain while waiting to start the stress portion of  the Cardiolite scan.  She was transferred to the intensive care unit and  nitroglycerin and heparin were started as well as integrilin.  The patient's  pain resolved, but then returned later this evening.  She is now brought  back to the cath lab for further evaluation.   PROCEDURE:  Left heart catheterization with coronary angiography.   The right femoral artery was easily cannulated using the modified Seldinger  technique.   HEMODYNAMICS:  The left ventricular pressure was 185/18 with an aortic  pressure of 188/97.   CORONARY ANGIOGRAPHY:  1. Left main coronary artery is smooth and normal.  2. The left anterior descending artery is very large and tortuous.  It gives     off several large diagonal branches.  The distal LAD wraps around the     apex and supplies a good bit of the apical wall.  The first diagonal     vessel is large and also fairly tortuous consistent with hypertension     heart disease.  3. The left circumflex artery is quite large.  It gives off several large     marginal branches all of which are within normal limits.  4. The right coronary artery is large and dominant.  It is fairly normal     throughout its  course.  It gives off a large posterior descending artery     which has an abrupt occlusion in its terminal segment.  This appears to     be more consistent with an embolus than anything. The posterior lateral     branches are within normal limits.   LEFT VENTRICULOGRAM:  Left ventriculogram reveals overall well preserved  left ventricular systolic function.  The ejection fraction was approximately  50%.  There is moderate to severe hypokinesis at the inferior apical  segments which correlates with her small occlusion of the posterior  descending artery.   COMPLICATIONS:  None.   CONCLUSIONS:  Very small inferoapical myocardial infarction.  The vessel is  quite small and it is at the terminal aspect of the posterior descending  artery.  It is way to  far out to even consider doing angioplasty.  We will  continue with  medical therapy.  She will need aggressive control of her  blood pressure.                                               Vesta Mixer, M.D.    PJN/MEDQ  D:  11/04/2003  T:  11/05/2003  Job:  [id number]

## 2010-12-04 NOTE — Assessment & Plan Note (Signed)
[facility]                               PULMONARY OFFICE NOTE   NAME:[name], [name]                   MRN:          [id number]  DATE:03/22/2006                            DOB:          [birth year]    HISTORY OF PRESENT ILLNESS:  The patient is a 77 year old African-American  female patient of Dr. Sherene [name] with a history of chronic cough which resolved  with steroids.  The patient reports she has been off prednisone for  approximately 2 weeks.  Her cough had totally resolved; however, for the  last several days she has noted intermittent cough that is slowly returning,  however is not nearly as severe as it had been before.  The patient has  brought all of her medications in today for review and they do match our  medication list.  She has had multiple changes over the last several months.   PAST MEDICAL HISTORY:  Reviewed.   CURRENT MEDICATIONS:  Reviewed.   PHYSICAL EXAMINATION:  GENERAL:  The patient is a pleasant female in no  acute distress.  VITAL SIGNS:  She is afebrile with stable vital signs.  O2 saturation is 99%  on room air.  HEENT:  Unremarkable.  NECK:  Supple without adenopathy.  No JVD.  CHEST:  Lung sounds are clear.  CARDIAC:  Regular rate.  ABDOMEN:  Soft and benign.  EXTREMITIES:  Warm without any edema.   IMPRESSION AND PLAN:  1. Steroid-responsive cough that had totally resolved, however seems to be      slowly returning on just being off of prednisone.  The patient has been      explained to use Tramadol and Delsym as needed.  The patient has a      follow-up appointment with Dr. Sherene [name] for a full set of PFTs and to      decide if Qvar therapy would be an appropriate choice for this patient.  2. Complex medication regimen.  The patient's medications were reviewed in      detail.  Patient education was provided.  Her computerized medication      calendar was adjusted accordingly and reviewed in detail.  The patient  is aware to bring this back to each and every visit.                                   Rubye Oaks, NP                                [name]. [name] Sires, MD, [facility]   TP/MedQ  DD:  03/22/2006  DT:  03/22/2006  Job #:  [id number]

## 2010-12-04 NOTE — Assessment & Plan Note (Signed)
[facility]                             PULMONARY OFFICE NOTE   NAME:[name], [name]                   MRN:          [id number]  DATE:06/13/2006                            DOB:          1932-05-16    HISTORY:  This is an 77 year old black female with previous difficult-to-  control asthma, wheezing and cough, doing well on maintenance therapy  with Prevacid and here today for positional left shoulder pain.  She  continues to struggle quite a bit with the different instructions she is  getting from various medical sources.  She has already been evaluated  for chest pain by a cardiologist, whom she could not name, and I  understand underwent a CT scan of her chest as well.  The pain in her  shoulder is positional in nature.  She denies any associated dyspnea or  cough.   For a full inventory of medications please see face sheet, dated  June 13, 2006.   PHYSICAL EXAMINATION:  GENERAL:  She is a pleasant, ambulatory black  female in no acute distress with stable vital signs.  HEENT:  Unremarkable.  Oropharynx is clear.  LUNG FIELDS:  Perfectly clear bilaterally to auscultation and  percussion.  HEART:  There is a regular rhythm without murmur, gallop, rub.  ABDOMEN:  Soft, benign.  EXTREMITIES:  Warm without calf tenderness, cyanosis, clubbing, edema.   IMPRESSION:  1. Excellent control of asthma.  2. Shoulder discomfort is positional in nature but no exertional or      pleuritic component suggests a cardiac or pulmonary source.   I really feel that it would be best for this patient to return to Dr.  Clelia Croft for coordination of all her care and also to keep up with her  complex medical regimen.   In terms of her asthma management, I note that she has not required  albuterol in the last several months, probably because of attention to  detail in terms of treating her reflux.  Therefore, pulmonary followup  can be p.r.n.     [name].  Sherene Sires, MD, [facility]  Electronically Signed    MBW/MedQ  DD: 06/13/2006  DT: 06/13/2006  Job #: [id number]   cc:   Kari Baars, M.D.

## 2010-12-04 NOTE — Assessment & Plan Note (Signed)
[facility]                               PULMONARY OFFICE NOTE   NAME:[name], [name]                   MRN:          [id number]  DATE:04/07/2006                            DOB:          09-25-1931    This is a pulmonary extended acute office evaluation.   HISTORY:  A 77 year old black female, whom I would characterize as  predominantly upper airways dysfunction related to rhinitis, sinusitis and  reflux.  She is typically disorganized with her medications, and comes back  today with what is supposed to be a fully reconciled medication calendar,  which includes both maintenance and p.r.n.s.  She does appear to understand  much better the nature of a maintenance set of medicines, which I am  confident that she takes, is listed in the column April 07, 2006, and  contains Singulair at bedtime along with high doses of Reglan and Prevacid.  The problem is, she comes in now with her first flare-up of cough since her  last round of prednisone in late August.  She has severe coughing paroxysms  day greater than night, mostly dry in nature, associated with hoarseness.  She denies any fevers, chills, sweats, chest pain or leg swelling.   MEDICATIONS:  For a full inventory of her medications, please see the face  sheet column dated April 07, 2006, but note she says she only has half  of the medicines that are listed as p.r.n., and does not really understand  how to use the p.r.n. list at all.   PHYSICAL EXAMINATION:  GENERAL:  She is an animated black female in no acute  distress.  VITAL SIGNS:  Afebrile, stable vital signs.  HEENT:  Unremarkable, oropharynx is clear.  Lung fields reveal prominent pseudo wheeze.  There is minimal true wheeze.  HEART:  With regular rhythm without murmur, gallop or rub.  ABDOMEN:  Soft, benign.  EXTREMITIES:  Warm without calf tenderness, cyanosis, clubbing or edema.   IMPRESSION:  Recurrent cough that appears  to only respond to prednisone, and  continues to exacerbate off of prednisone despite what I think is 100%  compliance with maintenance medicines directed at reflux.  For that reason,  Singulair was added, but she is breaking through the Singulair too.  I  therefore struck it from the list of medications, and asked her to stop it.   I am going to give her another 6-day course of prednisone, asked her to  return in 2 weeks, and at that time consider teaching her how to use QVAR,  the medication that will address the lower airway component of her problem  with the least irritation of the upper airway, which continues to be a  prominent problem.   The other issue is, looking over her chart, it appears that she does have  rhinitis with no evidence of definite sinusitis by CT scan February 08, 2006.  It may well be that nasal steroids could be added as well as the QVAR, to  see if we can maintain her at the same level of control of cough without the  need  for prednisone, if nasal steroids were added to her present regimen.   Since compliance remains such an issue with this patient, I am going to  bring her back first to make sure we are reading from the same page in  terms of both the administration of maintenance and p.r.n.s, between now and  her next visit with me.   I spent extra time with this patient, 15 to 20 minutes of a 25-minute visit,  going over line by line both maintenance and p.r.n.s, finding multiple  inconsistencies about the way she takes her p.r.n.s versus the way they are  listed, and trying to help her as much as possible by editing the list she  has, but just without the bag of medicines to correspond to this it is very  hard to do this, and I explained to her that is the purpose of the visit to  see our nurse practitioner before seeing me again.                                   [name]. Sherene Sires, MD, FCCP   MBW/MedQ  DD:  04/08/2006  DT:  04/10/2006  Job #:   [id number]   cc:   Kari Baars, M.D.

## 2010-12-04 NOTE — Cardiovascular Report (Signed)
NAME:  [name], [name]            ACCOUNT NO.:  [id number]   MEDICAL RECORD NO.:  [id number]          PATIENT TYPE:  OIB   LOCATION:  1963                         FACILITY:  [facility]   PHYSICIAN:  Vesta Mixer, M.D. DATE OF BIRTH:  Jul 04, 1932   DATE OF PROCEDURE:  10/29/2005  DATE OF DISCHARGE:                              CARDIAC CATHETERIZATION   [name] is a 77 year old female with a history of a small  myocardial infarction in the past caused by what appeared to be an embolus.  She had otherwise fairly normal coronary arteries.  She presents now with  recurrent episodes of chest discomfort.  She is scheduled for heart  catheterization for further evaluation.   The right femoral artery was easily cannulated using a modified Seldinger  technique.   HEMODYNAMIC RESULTS:  LV pressure was 167/13 with an aortic pressure of  168/86.   ANGIOGRAPHY:  Left main is large and normal.   Left anterior descending artery is a very tortuous vessel.  It is fairly  smooth normal.  The first diagonal artery is a moderate size branch which is  normal.  The second diagonal artery is a small branch, but it is also  normal.   Left circumflex is normal.  The first obtuse marginal artery is normal.   The right coronary artery is extremely large and is dominant.  It is smooth  and normal throughout its course.  The posterior descending artery revealed  the site of a previous abrupt cut off which appeared to be consistent with  an embolus.  There is no evidence of any embolus at this point.  The  posterolateral segment artery is also normal.   Left ventricular systolic function reveals a hyperdynamic left ventricle.  Ejection fraction is approximately 75%.  There is still a very small apical  knuckle or area of dyskinesis.  This may be due to a hypertrophic  cardiomyopathy.  She has no significant mitral regurgitation.   COMPLICATIONS:  None.   CONCLUSION:  1.  Relatively smooth and  normal coronary arteries.  2.  Normal/hyperdynamic left ventricular systolic function.  We will      continue with medical therapy.  We will need to add beta blockers and/or      calcium blockers.           ______________________________  Vesta Mixer, M.D.     PJN/MEDQ  D:  10/29/2005  T:  10/29/2005  Job:  [id number]   cc:   [facility]

## 2010-12-04 NOTE — Procedures (Signed)
[name]  Patient:    [name], [name]                   MRN: [id number] Proc. Date: 01/21/00 Adm. Date:  [id number] Disc. Date: [id number] Attending:  Sabino Gasser                           Procedure Report  PROCEDURE PERFORMED:  Upper endoscopy.  ENDOSCOPIST:  Sabino Gasser, M.D.  INDICATIONS FOR PROCEDURE:  Abdominal pain.  ANESTHESIA:  Demerol 50 mg, Versed 4 mg was given intravenously in divided dose.  DESCRIPTION OF PROCEDURE:  With the patient mildly sedated in the left lateral decubitus position, the Olympus video endoscope was inserted in the mouth and passed under direct vision through the esophagus, which appeared normal, into the stomach.  The fundus, body well visualized, appeared normal.  In the antrum there was a raised area on the greater curvature in the antrum, photogrphed and biopsied.  We advanced through the pylorus.  The duodenal bulb and second portion of the duodenum were all well visualized and appeared normal.  From this point, the endoscope was slowly withdrawn taking circumferential views of the entire duodenal mucosa until the endoscope had been pulled back into the stomach and placed on retroflexion to view the stomach from below and this appeared normal.  The endoscope was then straightened and pulled back from distal to proximal stomach taking circumferential views of the entire gastric mucosa and subsequently esophageal mucosa, all of which appeared normal.  Patients vital signs and pulse oximeter remained stable.  The patient tolerated the procedure well without apparent complications.  FINDINGS:  Antral mass, biopsied.  Await biopsy report.  Patient will call me for results and follow up with me as an outpatient.  Proceed with colonoscopy.  PLAN: DD:  01/21/00 TD:  01/21/00 Job: [id number]

## 2010-12-04 NOTE — Assessment & Plan Note (Signed)
[facility]                               PULMONARY OFFICE NOTE   NAME:[name], [name]                   MRN:          [id number]  DATE:02/22/2006                            DOB:          09/26/1931    This is a 77 year old black female, never a smoker, with chronic cough  attributed to postnasal drip syndrome versus reflux. Frustrated on her last  visit that she could not stay better, although noted had seen multiple  physicians to try to control her cough. Her cough was so bad that she would  cough to the point of vomiting. She returns today without the requested  medication bags or medication lists that she was previously provided. She  says, however, that she is doing much better since she was restarted on  Singular every evening along with a short course of prednisone and  aggressive treatment directed at reflux.   In the meantime she underwent a sinus CT scan that shows minimal fluid in  the left maxillary sinus in a study dated February 08, 2006, but denies any  nocturnal cough or purulent sputum production, fever, chills, sweats. She  does have shortness of breath with exertion but not at rest or nocturnally.   MEDICATIONS:  Her medications are confusing to me, but were recorded in the  column dated February 22, 2006 as previously listed. Note I have no confidence  she actually takes these medicines as recommended.   PHYSICAL EXAMINATION:  GENERAL:  She is an animated, pleasant ambulatory  black female in no acute distress.  VITAL SIGNS:  Afebrile, normal vital signs.  HEENT:  Unremarkable. Oropharynx clear.  NECK:  Supple without cervical adenopathy or tenderness. Trachea is midline.  No thyromegaly.  LUNGS:  Lung fields completely clear bilaterally to auscultation and  percussion.  HEART:  There is a regular rhythm without murmur, gallop or rub.  ABDOMEN:  Soft, benign.  EXTREMITIES:  Warm without calf tenderness, cyanosis, clubbing or  edema.   LABORATORY DATA:  Hemoglobin saturation 98% on room air.   IMPRESSION:  There is marked improvement in her symptoms of rhinitis and  reflux with combination of Singulair and PPI therapy. She does have minimal  residual sinusitis, but has adequate control of her cough at this point.   The issue is whether she is going to understand the difference between  maintenance and p.r.n. and maintain herself on the same medicines long  enough to give [place] both confidence that she is indeed on the right track. I  have offered the services of our nurse practitioner to help her sort through  her maintenance versus p.r.n.'s if she will return for followup.   If she continues to have any symptoms of dyspnea with exertion having never  smoked, I would recommend a repeat chest x-ray and PFTs on her next visit,  but I suspect this symptom too is related to upper airway instability, from  either rhinitis, reflux or both.  [name]. [name] Sires, MD, [facility]   MBW/MedQ  DD:  02/22/2006  DT:  02/22/2006  Job #:  [id number]   cc:   Kari Baars, MD

## 2010-12-04 NOTE — Discharge Summary (Signed)
NAME:  MCKAYLIE, VASEY            ACCOUNT NO.:  [id number]   MEDICAL RECORD NO.:  [id number]          PATIENT TYPE:  INP   LOCATION:  3733                         FACILITY:  [facility]   PHYSICIAN:  Vesta Mixer, M.D. DATE OF BIRTH:  18-Sep-1931   DATE OF ADMISSION:  04/23/2006  DATE OF DISCHARGE:  04/26/2006                                 DISCHARGE SUMMARY   DISCHARGE DIAGNOSES:  1. Nonspecific abdominal pain.  2. Nonspecific chest pain.  3. History of coronary artery disease with a remote history of small      myocardial infarction.  4. Dyslipidemia.  5. Hypertension.  6. Gastroesophageal reflux disease.   DISCHARGE MEDICATIONS:  1. Aspirin 325 mg a day.  2. Plavix 75 mg a day.  3. Atenolol 100 mg a day.  4. Protonix 40 mg a day.  5. Zetia 10 mg a day.  6. Hydrochlorothiazide 12.5 mg a day.  7. Norvasc 5 mg a day.  8. Prednisone 10 mg a day.  9. Reglan 10 mg with meals.   DISPOSITION:  The patient will see Dr. Elease Hashimoto 1-2 weeks for a follow-up  visit.   HISTORY:  Ms. Polack is a 77 year old female who was admitted to hospital  with abdominal pain and chest pain.  Please see dictated H&P for further  details.   HOSPITAL COURSE BY PROBLEMS.:  Problem 1.  CHEST PAIN:  The patient ruled out for myocardial infarction.  She had no EKG changes.  She had a mildly elevated D-dimer.  A subsequent  spiral CT of the chest ruled out pulmonary embolus.  Likewise, the CT did  not reveal any aortic dissection or any other abnormality.  The patient's  chest pain really sounded more like an abdominal pain after she had been  there for several days.  She was discharged in stable condition.   Problem 2.  ABDOMINAL PAIN:  The patient has seen a GI doctor in the past  but does not remember who it was.  I have asked her to return to her GI  doctor.  She does have a microcytic anemia and has taken iron in the past.  Will have her see her gastroenterologist for further recommendations.  She  had an abdominal ultrasound, which revealed that she is status post  cholecystectomy.  There were no additional abnormalities   Problem 3.  DYSLIPIDEMIA:  Stable.   Problem 4.  HYPERTENSION:  Stable.           ______________________________  Vesta Mixer, M.D.     PJN/MEDQ  D:  04/26/2006  T:  04/27/2006  Job:  [id number]   cc:   Kari Baars, M.D.

## 2010-12-04 NOTE — Op Note (Signed)
NAME:  [name], [name]            ACCOUNT NO.:  [id number]   MEDICAL RECORD NO.:  [id number]          PATIENT TYPE:  AMB   LOCATION:  ENDO                         FACILITY[facility]   PHYSICIAN:  John C. Madilyn Fireman, M.D.    DATE OF BIRTH:  1932-07-18   DATE OF PROCEDURE:  04/16/2005  DATE OF DISCHARGE:                                 OPERATIVE REPORT   PROCEDURE:  Esophagogastroduodenoscopy with biopsy.   INDICATIONS FOR PROCEDURE:  Atypical chest pain with suspected  gastroesophageal etiology also reported history of some sort of gastric  polyp or mass in the past.   DESCRIPTION OF PROCEDURE:  The patient was placed in the left lateral  decubitus position then placed on the pulse monitor with continuous low-flow  oxygen delivered by nasal cannula. She was sedated with 37.5 mcg IV fentanyl  and 3 mg IV Versed. The Olympus video endoscope was advanced under direct  vision into the oropharynx and esophagus. The esophagus was straight and of  normal caliber with the squamocolumnar line at 38 cm. There was no visible  hiatal hernia, ring, stricture or other abnormalities of the esophagus or GE  junction. The stomach was entered and a small amount of liquid secretions  were suctioned from the fundus. Retroflexed view of the cardia was  unremarkable. In the fundus and body, there were several small to moderate  size gastric polyps the largest of which was about 8-9 mm in diameter. There  were some small as small as 2-3 and estimated number of total polyps seen  was about 10-12. Deep biopsies were taken of the two largest of these and  they were fairly soft. There were also 2 or 3 elevated folds in the distal  antrum with some slight overlying erythema, possibly consistent with mild  antral gastritis. The pylorus was nondeformed and easily allowed passage of  the endoscope tip into the duodenum. Both the bulb and second portion were  well inspected and appeared to be within normal limits. The  scope was then  withdrawn and the patient returned to the recovery room in stable condition.  She tolerated the procedure well and there were no immediate complications.   IMPRESSION:  Multiple small to moderate gastric polyps, otherwise normal  study.   PLAN:  Await biopsy results to rule out malignancy or adenomatous polyps  which would probably warrant removal.           ______________________________  Everardo All. Madilyn Fireman, M.D.     JCH/MEDQ  D:  04/16/2005  T:  04/16/2005  Job:  [id number]   cc:   Dr. Adah Salvage [facility]

## 2010-12-04 NOTE — Discharge Summary (Signed)
NAME:  [name], [name]                      ACCOUNT NO.:  [id number]   MEDICAL RECORD NO.:  [id number]                   PATIENT TYPE:  INP   LOCATION:  2019                                 FACILITY:  [facility]   PHYSICIAN:  Georgann Housekeeper, M.D.                 DATE OF BIRTH:  09-23-1931   DATE OF ADMISSION:  11/02/2003  DATE OF DISCHARGE:  11/07/2003                                 DISCHARGE SUMMARY   CONDITION ON DISCHARGE:  Stable.   DISCHARGE DIAGNOSES:  1. Chest pain, non-Q-wave myocardial infarction, apical.  2. Hypertension.  3. Asthma.  4. Depression and anxiety.  5. _______________disease.   DISCHARGE MEDICATIONS:  1. Advair 100/50 one inhalation b.i.d.  2. Hydrochlorothiazide 12.5 mg daily.  3. Zetia 10 mg daily.  4. Norvasc 2.5 mg daily.  5. Atenolol 50 mg b.i.d.  6. Lexapro 10 mg daily.  7. Singular 10 mg daily.  8. Zyrtec 10 mg daily.  9. Aciphex 20 mg daily.  10.      Trazodone 100 mg q.h.s.  11.      Aspirin 81 mg daily.  12.      Plavix 75 mg daily.  13.      Imdur 30 mg daily.  14.      Cardura 2 mg q.h.s.  15.      Prednisone 10 mg for three more days.  16.      Albuterol meter dosed inhaler p.r.n.  17.      Magnesium one a day.  18.      Potassium 10 mEq once a day.  19.      Nitroglycerin sublingual p.r.n.   ACTIVITY:  As per cardiac rehab.  Home health services, cardiac rehab  outpatient.   DIET:  Low salt.   FOLLOWUP:  1. Dr. Delane Ginger, cardiology.  2. Follow up with me at the office in three weeks.   CONSULTATIONS:  Cardiology.   PROCEDURE:  Cardiac catheterization.   HOSPITAL COURSE:  Please see H&P for details.  A 77 year old female with a  history of hypertension, depression, asthma, admitted with atypical chest  pain.  EKG was unremarkable.  The patient ruled out for a MI with cardiac  enzymes negative.  Continues to have chest pain and arm pains even with the  nitroglycerin and heparin.  The patient was seen by cardiology, Dr.  Delane Ginger.  Initial plan was for Cardiolite, but because of these symptoms, the  patient underwent a cardiac catheterization on November 04, 2003.  It showed  normal LAD, circumflex, and RCA.  There was terminal PDA which was  occlusive, and a small inferior apical MI.  The vessel was too small for any  intervention.  The patient was continued medically, continued on heparin,  and subsequently Integrilin which was stopped, and the patient was put on  Plavix.  The patient remains chest pain free.  She had a little  bit of  headache and arm pain, but she was hemodynamically stable.  Her blood  pressure remained stable throughout and afebrile.  As far as heart rate, it  remained between 60s to 70s.  The patient had cardiac rehab started.  As far  as the second issue of asthma, continued on prednisone, Advair, and Xopenex  nebulizer p.r.n. and Singular.  The patient as far as acid reflux disease,  continued on Protonix here.  For depression, continued on Lexapro and  trazodone.  Follow up with the cardiology and me as an outpatient.   CONDITION ON DISCHARGE:  Stable.                                                Georgann Housekeeper, M.D.    [name]  D:  11/07/2003  T:  11/09/2003  Job:  [id number]

## 2010-12-04 NOTE — Op Note (Signed)
NAME:  [name], [name]                ACCOUNT NO.:  o   MEDICAL RECORD NO.:  [id number]          PATIENT TYPE:  AMB   LOCATION:  ENDO                         [facility]ACILITY[facility]   PHYSICIAN:  Danise Edge, M.D.   DATE OF BIRTH:  1932-01-07   DATE OF PROCEDURE:  09/07/2004  DATE OF DISCHARGE:                                 OPERATIVE REPORT   PROCEDURE:  Colonoscopy and polypectomy.   INDICATIONS FOR PROCEDURE:  Ms. Tyerra Loretto is a 77 year old female  born August 30, 1931.  Ms. Dahan underwent a colonoscopy in 2001;  hyperplastic polyps were removed from the colon.  Ms. Peyser has colonic  diverticulosis.   ENDOSCOPIST:  Danise Edge, M.D.   PREMEDICATION:  Versed 2.5 mg, Demerol 50 mg.   DESCRIPTION OF PROCEDURE:  After obtaining informed consent, Ms. Beachem was  placed in the left lateral decubitus position. I administered intravenous  Demerol and intravenous Versed to achieve conscious sedation for the  procedure. The patient's blood pressure, oxygen saturation and cardiac  rhythm were monitored throughout the procedure and documented in the medical  record.   Anal inspection and digital rectal exam were normal. The Olympus adjustable  pediatric colonoscope was introduced into the rectum and advanced to the  cecum. Colonic preparation for the exam today was excellent.   RECTUM:  Normal.   SIGMOID COLON AND DESCENDING COLON:  From the distal sigmoid colon at 25 cm  from the anal verge, a 1 mm sessile polyp was removed with the cold biopsy  forceps.  Left colonic diverticulosis present.   SPLENIC FLEXURE:  Normal.   TRANSVERSE COLON:  Normal.   HEPATIC FLEXURE:  Normal.   ASCENDING COLON:  Normal.   CECUM AND ILEOCECAL VALVE:  Normal.   ASSESSMENT:  A diminutive polyp was removed from the distal sigmoid colon.  Left colonic diverticulosis present.      MJ/MEDQ  D:  09/07/2004  T:  09/07/2004  Job:  [id number]   cc:   Georgann Housekeeper, MD  [address]  [place]  [place] [address]  Fax: [phone]

## 2010-12-04 NOTE — Assessment & Plan Note (Signed)
[facility]                           GASTROENTEROLOGY OFFICE NOTE   NAME:Tankard, VEAR STATON                   MRN:          [id number]  DATE:                                      DOB:          [birth year]    REFERRING PHYSICIAN:  Kari Baars, M.D.   REASON FOR REFERRAL:  Chest pain and epigastric pain.   HISTORY OF PRESENT ILLNESS:  Ms. Ozga is a 77 year old African-American  female referred through the courtesy of Dr. Eric Form. She relates problems  with pain in her upper chest and left lower quadrant region that has been  intermittently bothersome for several years. She has frequent problems with  gas, bloating and diarrhea. She was previously diagnosed with GERD and has  been maintained on Prevacid b.i.d. She has been previously evaluated by Dr.  Dorena Cookey. She underwent upper endoscopy for atypical chest pain in  September of 2006 which showed multiple small to moderate size gastric  polyps with pathology revealing benign fundic gland polyps. She underwent  colonoscopy with polypectomy by Dr. Reece Agar in February of 2006 which  showed only one sessile hyperplastic polyp and left colon diverticulosis.  She notes no dysphagia, odynophagia, melena, hematochezia, vomiting or  change in bowel habits. She has a microcytic anemia with a hemoglobin of  11.1 and an MCV of 67.6. Abdominal ultrasound from April 25, 2006 showed  mild extrahepatic biliary dilatation to 13.4 mm, a prior cholecystectomy and  a 1.1 cm simple right renal cyst. Please see the history and physical exam  and discharge summary dated April 23, 2006 through April 26, 2006. In  addition, she underwent a CT angiogram of the chest that showed no acute  abnormalities. There was a slight increase in biliary dilatation noted  compared to prior study from 2001. Recent liver function tests from October  7 were entirely normal with a normal amylase and lipase as well.   No  family history of colon cancer or inflammatory bowel disease. Her mother  and sister have had colon polyps.   PAST MEDICAL HISTORY:  Coronary artery disease with a history of a  myocardial infarction, dyslipidemia, hypertension, GERD, diverticulosis,  unspecified thyroid disorder, depression, allergies, status post  cholecystectomy, status post hysterectomy, status post appendectomy.   CURRENT MEDICATIONS:  Listed on the chart have been reviewed.   MEDICATION ALLERGIES:  STATIN drugs.   SOCIAL HISTORY:  As per handwritten form.   REVIEW OF SYSTEMS:  As per handwritten form.   PHYSICAL EXAMINATION:  GENERAL:  No acute distress.  VITAL SIGNS:  Height 5 feet 3 inches, weight 159.4 pounds, blood pressure  146/72, pulse 56 and regular.  HEENT:  Anicteric sclera, oropharynx clear.  CHEST:  Clear to auscultation bilaterally.  CARDIAC:  Regular rate and rhythm without murmurs appreciated.  ABDOMEN:  Soft with very minimal left lower quadrant tenderness to deep  palpation. No rebound or guarding. No palpable organomegaly, masses or  hernias. Normal active bowel sounds.  RECTAL:  Digital rectal examination reveals no lesions and Hemoccult  negative stool in the vault.  EXTREMITIES:  Without clubbing, cyanosis or edema.  NEUROLOGIC:  Alert and oriented x3. Grossly nonfocal.   ASSESSMENT/PLAN:  1. Left lower quadrant abdominal pain with intermittent diarrhea, gas and      bloating. Rule out Reglan side effects. Discontinue Reglan. Continue      Prevacid b.i.d. for management of GERD. Trial of Levsin if      discontinuing Reglan does not improve her symptoms.  She will return to      see me if her symptoms do not resolve and we could consider further      evaluation with an abdominal and pelvic CT scan.   1. Microcytic anemia. Followed by Dr. Arbutus Ped. No need for repeat      colonoscopy or egd at this time.   1. Biliary dilatation is likely related to her prior cholecystectomy and I       do not feel this represents a pathologic change; however, it is      reasonable to repeat her liver function tests in 3 months and again in      6 months to ensure stability.     Venita Lick. Russella Dar, MD, [facility]  Electronically Signed    MTS/MedQ  DD: 06/06/2006  DT: 06/06/2006  Job #: [id number]   cc:   Kari Baars, M.D.

## 2010-12-04 NOTE — Assessment & Plan Note (Signed)
[facility]                               PULMONARY OFFICE NOTE   NAME:[name], [name]                   MRN:          [id number]  DATE:04/29/2006                            DOB:          October 14, 1931    HISTORY OF PRESENT ILLNESS:  The patient is a 77 year old African American  female, a patient of Dr. Sherene Sires, who has a known history of recurrent cough  who presents for a followup to review medications.  The patient has brought  all of her medications in today for review and are correct with her  medication list.  The patient has had a persistent chronic cough felt to be  related to rhinitis and reflux.  The patient has responded very well to a  reflux and rhinitis prevention measures along with a cough suppression  regimen.  Unfortunately, the patient had a cough that returned after  tapering off of prednisone.  Last visit the patient was reiterated on her  medication regimen and given a 6-day course of prednisone.  The patient  reports she has totally been cough free since being off of prednisone for  approximately 2 weeks.  The patient does report she was recently  hospitalized April 23, 2006 through April 26, 2006, for chest wall pain.  She was admitted by cardiology.  Her discharge summary is unavailable at  today's visit.  She does report that she was felt to have some underlying  reflux and did not have any acute coronary event per the patient.  The only  medication change during the hospitalization was aspirin was increased up to  325 mg.   PAST MEDICAL HISTORY:  Reviewed.   CURRENT MEDICATIONS:  Reviewed.   PHYSICAL EXAMINATION:  GENERAL:  The patient is a pleasant female in no  acute distress.  VITAL SIGNS:  She is afebrile with stable vital signs.  O2 saturation is 98%  on room air.  HEENT:  Unremarkable.  NECK:  Supple without adenopathy.  No JVD.  RESPIRATORY:  Lung sounds are clear.  CARDIAC:  Regular rate and rhythm.  ABDOMEN:  Soft and benign.  EXTREMITIES:  Warm without any calf tenderness, cyanosis, clubbing, or  edema.   IMPRESSION AND PLAN:  1. Recurrent cough felt secondary to rhinitis and reflux.  The patient is      currently doing well and is 100% cough free with her reflux and      rhinitis prevention.  Also, the patient is advised she has a regimen      including loratadine, Delsym, and Tramadol to start if cough returns.      The patient also will remain on Prevacid 30 mg twice daily along with      Reglan 4 times a day with an antireflux preventative diet.  The patient      will return as scheduled with Dr. Sherene Sires or sooner if needed.  2. Complex medication regimen.  The patient's medications were reviewed in      detail.  Patient education was provided.  The patient's computerized      medication calendar was adjusted accordingly and  reviewed with this      patient.  The patient is aware to bring this back to each      and every visit.  3. Health maintenance.  Flu vaccine was given today.      ______________________________  Rubye Oaks, NP    ______________________________  [name]. Sherene Sires, MD, [facility]     TP/MedQ  DD:  05/03/2006  DT:  05/03/2006  Job #:  [id number]

## 2010-12-04 NOTE — H&P (Signed)
NAME:  [name], [name]            ACCOUNT NO.:  [id number]   MEDICAL RECORD NO.:  [id number]          PATIENT TYPE:  INP   LOCATION:  2921                         FACILITY:  [facility]   PHYSICIAN:  Santiago Bumpers. Hensel, M.D.DATE OF BIRTH:  10-28-1931   DATE OF ADMISSION:  02/28/2005  DATE OF DISCHARGE:                                HISTORY & PHYSICAL   CHIEF COMPLAINT:  Chest pain.   HPI:  A 77 year old African-American female with history of MRI in  2004/2005, arteriosclerosis coronary, hypertension, who was brought to [facility] ED by EMS.  Patient stated that chest pain started at 10 p.m.  ___________ at 12:00.  Chest pain location:  Substernal, intensity 10/10, it  radiated to left arm.  Denies SOB/nausea/vomiting/abdominal  pain/diaphoresis/palpitations.  Patient took NTG x2 with no pain relief,  then she called EMS.  EMS gave her NTG x1 with no pain relief.  At the ED,  NTG drip was started, chest pain decreased from 10/10 to 3/10.  Dr. Elease Hashimoto  (Cardiology) performed a stress test 3 weeks ago with normal results.  Dr.  Elease Hashimoto was consulted by ED M.D. and he is aware that Ms. [name] will be  admitted to rule out MI, and agrees with the plan.  Patient stated she needs  two pillows for sleep, she is unable to sleep flat secondary to orthopnea.  Patient also stated that she wakes up in the night coughing, with shortness  of breath.  This has been worsening since the last month.   PAST MEDICAL HISTORY:  1.  Arteriosclerosis, coronary.  2.  Hypertension.  3.  Hyperlipidemia.  4.  Osteoarthritis, multiple sites.  5.  GERD.  6.  Diverticulitis of colon.  7.  MI on April, 2005.   PROCEDURES:  1.  Heart catheterization, no stent, on April, 2005.  2.  Hysterectomy on January, 1985.  3.  Appendectomy January, 1940.  4.  Total knee replacement (left) January, 2003.  5.  Colonoscopy:  Polyps/diverticulosis February, 2006.   ALLERGIES:  STATINS (MYALGIAS).   MEDICATIONS:  1.   Aspirin 81 mg daily.  2.  Atenolol 50 mg daily.  3.  HCTZ 12.5 mg daily.  4.  Imdur 30 mg daily.  5.  Metoclopramide 10 mg with meals and at bedtime.  6.  Nexium 40 mg daily.  7.  NTG SL as needed for chest pain.  8.  Norvasc 75 mg daily.  9.  Potassium chloride 10 mEq daily.  10. Singulair 10 mg daily.  11. Trazodone 100 mg q.h.s.  12. Zetia 10 mg daily.  13. Zyrtec 10 mg daily.   FAMILY HISTORY:  Mother died at 23 years old of heart disease, RA,  hypertension.  Father died at 70 years old (heart disease), brother died at  6 years old.  Sister, 53 years old, living with heart disease, SLE,  hypertension.  Sister, 65 years old, living with RA, MI.   SOCIAL HISTORY:  Never married, son in [place], never smoked.  Denies  ETOH.  Able to do ADLs.  Ambulates with cane.  Attends to church.  Unable to  exercise with her knee pain.  She has two grandkids.  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   REVIEW OF SYSTEMS:  CONSTITUTIONAL:  No weakness/fever.  CARDIOVASCULAR:  Positive chest pain.  Denies SOB/diaphoresis.  Positive orthopnea.  PULMONARY:  No cough/wheezing.  GI:  No nausea/vomiting/diarrhea/abdominal  pain.  GU:  No dysuria/frequency.  SKIN:  No rash.  The remainder of ROS is  unremarkable.   PHYSICAL EXAMINATION:  Temperature 97.6, pulse 57-62, respiratory rate 16,  blood pressure 135-157/79-84.  O2 sats 95-97% on 2 liters of oxygen.  I general, no acute distress, oriented x3.  HEENT:  Normocephalic, atraumatic.  PERRLA, EOMI.  Tympanic membranes clear  bilaterally.  Throat within normal limits.  NECK:  Supple, no lymphadenopathy.  MOUTH:  Missing front teeth (inferior), dentures (upper).  RESPIRATORY SYSTEM:  Bibasilar crackles, no wheezing, no rhonchi.  Good air  movement.  CARDIOVASCULAR:  RRR, normal S1, S2, no murmurs.  Peripheral pulses 2+.  ABDOMEN:  Soft, nondistended.  Bowel sounds positive.  Left lower quadrant  tenderness 1/10 on deep palpation.  No  rebound/guarding.  EXTREMITIES:  Trace edema.  GU:  Deferred.  NEURO:  II-XII intact.  No sensory deficit/weakness.  Strength/DTRs 2+ DTRs  x4, 5/5 x4.  Balance/gait deferred.  SKIN:  No rash.   LABORATORY DATA:  Pro. time 13.2, INR:  1.0.  PTT:  26.  CK MB, POC:  1.1,  less than 1 x2.  Troponin-I, POC, less than 0.05 x3.  Myoglobin, POC:  47-  35.4-38.5.  Chest x-ray showed no active cardiopulmonary disease.  EKG:  NSR, no changes.  Hemoglobin 15.3, hematocrit 39.  BMP:  Sodium 138,  potassium 3.4, chloride 103, pCO2 of 32, BUN 8, creatinine 1.   ASSESSMENT AND PLAN:  A 77 year-old African-American female with:  1.  Fibular chest pain:  Patient has substernal chest pain, relieved      partially with nitroglycerin.  2.  Previous history of myocardial infarction, arteriosclerosis coronary,      hypertension, hyperlipidemia, family history of heart disease.  Dr.      Elease Hashimoto (Cardiology) aware and agrees with admission to rule out      myocardial infarction.  Continue nitroglycerin drip 5 mcg/min.  Titrated      to control chest pain and for systolic blood pressure greater than      100/diastolic blood pressure greater than 70.  Continue home dose of      atenolol, hydrochlorothiazide, Imdur, Norvasc for increased blood      pressure.  Continue Plavix and ASA (home dose).  Cardiac enzymes q.8      hours x3.  EKG in a.m.  Oxygen.  Dr. Elease Hashimoto consulted, and will see      patient in a.m.  I will await for 2-D echocardiogram after find out of      patient has it done at cardiology office (the patient had a stress test      done 3 weeks ago by Dr. Elease Hashimoto).  Awaiting report for a stress test.      FLP pending for risk evaluation.  I will follow cardiac enzymes to rule      out myocardial infarction.  Other causes of chest pain such as pulmonary      embolism, __________ dissection, less likely given the clinical      presentation of this patient complaining only of chest pain with     ischemic  characteristics, normal chest x-ray, and no identifiable risk  factors.  Patient does have a history of GERD; therefore, I will start      Protonix 40 mg daily.  Patient denies localized, sharp/dull pain on      palpation; therefore, making costochondritis unlikely.  Patient does not      present dermatomal rash or sensory findings, making herpes zoster less      likely.  I will admit to coronary care unit (transitional) to rule out      myocardial infarction.  3.  Hypertension.  Continue home medication/doses:  Atenolol,      hydrochlorothiazide, Norvasc, Imdur.  4.  Hyperlipidemia:  Zetia.  FLP pending.  5.  GERD:  Protonix.  6.  FN:  Low sodium/low fat diet.  NS at 75 mL per hour.  7.  Disposition:  Admit to coronary care unit (transitional).  I will follow      cardiac enzymes, EKG in a.m.  Cardiac consult, after review test      available from cardiology office I will consider if 2-D echocardiogram      needed.      Estell Harpin   FIM/MEDQ  D:  02/28/2005  T:  02/28/2005  Job:  [id number]

## 2010-12-04 NOTE — Discharge Summary (Signed)
[facility]  Patient:    EDRA, RICCARDI Visit Number: [id number] MRN: [id number]          Service Type: ECR Location: [facility] [id number] Attending Physician:  Faith Rogue T Dictated by:   Dian Situ, PA Admit Date:  04/11/2001 Discharge Date: 04/22/2001   CC:         Cammy Copa, M.D.  Tyson Dense, M.D.   Discharge Summary  DISCHARGE DIAGNOSES: 1. Status post left total knee replacement. 2. Postoperative anemia, almost resolved. 3. Proteus Escherichia coli urinary tract infection.  HISTORY OF PRESENT ILLNESS:  Ms. Flett is a 77 year old female with history of mitral valve prolapse, diverticulosis, DJD of left knee who elected to undergo left total knee replacement on September 17, by Dr. August Saucer. Postoperatively, she was weightbearing as tolerated on Coumadin for DVT prophylaxis with INR 2.9 and therapeutic on admission.  Currently, she has been participating in therapy and is minimal assist for bed and mobility, guard assist for transfers, contact guard with close supervision for ambulating 140 feet with standard walker.  Knee flexion currently at 86 degrees.  PAST MEDICAL HISTORY:  1. See discharge diagnoses.  2. Appendectomy.  3. Hysterectomy.  4. Right shoulder scope.  5. Right breast biopsy.  6. Headaches.  7. Cholecystectomy.  8. Reflux.  9. Depression. 10. Colon polyps.  ALLERGIES:  No known drug allergies.  SOCIAL HISTORY:  The patient lives alone in one-level apartment with no steps at entry.  She was independent prior to admission.  She does not use any tobacco or alcohol.  HOSPITAL COURSE:  Ms. Freja Faro was admitted to [facility] on April 11, 2001, for inpatient therapies to consist of PT and OT daily.  Past admission, she was maintained on Coumadin for DVT prophylaxis.  Labs done past admission with hematocrit 25.3 and she was transfused with two units of packed red blood cells.  Her  CBC showed much improvement with followup labs on August 27, showing hemoglobin 11.1, hematocrit 33.6, white count 9.5 and platelets 495. Stool guaiacs x 3 were done and have been negative.  Of note, she was noted to have some mild elevation in her LFTs at admission with AST at 47, ALT at 42. Recheck of LFTs of October 3, shows resolution of AST at 19, ALT 17, albumin low at 2.7, total protein 6.7, direct bilirubin 0.1 and total bilirubin 0.4. She was noted to have E. coli Proteus urinary tract infection at admission and is treated with Cipro for this.  The patient did continue to have some concerns regarding emptying of her bladder.  PVRs were checked and the patient was noted to empty her bladder with double voiding.  The patient was able to empty her bladder with double voidings.  Her knee incision has healed well without any signs or symptoms of infection with no drainage or erythema noted.  The patients mood has been stable.  She has had good participation in therapy.  By the time of discharge, she was modified independent for ADLs including simple home skills.  She is modified independent for transfers, modified independent for ambulating 200 feet with a rolling walker.  She can navigate stairs with close supervision.  Flexion is at 94 degrees.  Further followup therapies to include home health PT/OT to continue with [facility]. The patient is discharged on 10 days additional Coumadin at low therapeutic dose at 2 mg per day.  On April 22, 2001, the patient is discharged to home.  DISCHARGE MEDICATIONS:  1. Tenormin 100 mg per day.  2. Hydrochlorothiazide 25 mg per day.  3. Avapro 300 mg per day.  4. Prilosec 20 mg per day.  5. OxyContin CR 20 mg b.i.d. x 1 week, 30 mg per day x 1 week, then     discontinue.  6. Oxycodone IR 5-10 mg p.o. q.4-6h. p.r.n. pain.  7. Risperdal 1.5 mg q.h.s.  8. Coumadin 2 mg q.p.m.  9. Trazodone 50 mg q.h.s. 10. Multivitamin with iron one per  day.  ACTIVITY:  Use walker.  DIET:  Regular.  WOUND CARE:  Keep area clean and dry.  SPECIAL INSTRUCTIONS:  Senokot S two p.o. b.i.d. while on OxyContin.  No alcohol, no driving and no aspirin, ibuprofen or Aleve while on Coumadin.  FOLLOWUP:  The patient is to follow up with Dr. August Saucer for postop check in two to three weeks.  Follow up with Dr. Riley Kill as needed. Dictated by:   Dian Situ, PA Attending Physician:  Faith Rogue T DD:  04/21/01 TD:  04/23/01 Job: [id number]

## 2010-12-04 NOTE — H&P (Signed)
NAME:  Zietlow, SHARLEEN SZCZESNY                      ACCOUNT NO.:  [id number]   MEDICAL RECORD NO.:  [id number]                   PATIENT TYPE:  INP   LOCATION:  2931                                 FACILITY:  [facility]   PHYSICIAN:  Candyce Churn, M.D.          DATE OF BIRTH:  30-Mar-1932   DATE OF ADMISSION:  11/02/2003  DATE OF DISCHARGE:                                HISTORY & PHYSICAL   CHIEF COMPLAINT:  Chest pain and headache.   HISTORY OF PRESENT ILLNESS:  Ms. Kuehnel is a pleasant 77 year old female  with sudden onset of substernal chest pressure that radiated up to her left  jaw and left arm associated with shortness of breath today while she was  cooking a meal at approximately 2 p.m.  It was somewhat of a sharp heavy  pain but not associated with diaphoresis.  The patient did feel short of  breath.  She called EMS and was brought to the [facility] Emergency Room and  2 sublingual nitroglycerins relieved her pain but taking some Maalox did  not.  She is admitted now to rule out myocardial ischemia.  She does have a  strong family history of coronary artery disease with 2 sisters that had MIs  in their 45s, according to the patient.   MEDICATIONS:  1. Trazodone 100 mg a night.  2. Aciphex 20 mg a day.  3. Norvasc 2.5 mg a day.  4. Hydrochlorothiazide 25 mg daily.  5. Atenolol 150 mg daily.  6. Potassium chloride 10 mEq at night.  7. Zetia 10 mg daily.  8. Lexapro 10 mg daily.  9. Prednisone 20 mg daily -- this is part of a prednisone taper of which she     was supposed to take for a week at 20 mg, then a week at 10 mg and stop.  10.      Singulair 10 mg a day.  11.      Zyrtec 10 mg a day.  12.      Advair inhaler, ? dose, 1 inhalation b.i.d.  13.      P.r.n. medications include an albuterol metered-dose inhaler,     Astelin nasal spray and p.r.n. nebulized albuterol.   ALLERGIES:  STATINS cause muscle cramps.   PAST MEDICAL HISTORY:  1. Obesity.  2. Fibromyalgia.  3. Depression.  4. Allergies to trees, grasses and molds, among others -- followed by Dr.     Percival Spanish.  5. Asthma.  6. Obesity.  7. Hypercholesterolemia.  8. Hypertension.  9. Chronic muscle cramps.  10.      Chronic sinusitis.   PAST SURGICAL HISTORY:  Past surgical history includes total abdominal  hysterectomy.  The patient has also had cholecystectomy, appendectomy and a  left total knee replacement.  She had a cardiac catheterization 15 years ago  that as okay.   FAMILY HISTORY:  Family history is positive for coronary disease in her  sisters and mother.  SOCIAL HISTORY:  She is single.  Never married.  Had 1 son.  The son has  children and grandchildren.  The patient lives alone but near her son.  She  has never used tobacco, alcohol or drugs.  She is a Control and instrumentation engineer.  She is retired  from domestic work, Musician work and also working for [employer].   HEALTH MAINTENANCE:  She did have a normal mammogram in February 2005.   REVIEW OF SYSTEMS:  She has daily muscle cramps, more at night, for which  takes potassium chloride.  Tends to cramp in her calves.  This is not  associated with walking.  She denies dysuria or any change in bowel habits  or heartburn recently.  She does complain of dyspnea when walking up steps.  Denies PND or orthopnea.   PHYSICAL EXAM:  GENERAL:  Obese female complaining of mild headache.  Has IV  nitroglycerin infusing.  Weight is 200, height 5 feet 3-1/2 inches.  VITAL SIGNS:  Blood pressure 155/76, respiratory rate 22 and easy, pulse 64  and regular, temperature 97.7.  HEENT:  Exam reveals her to be atraumatic and normocephalic.  Oropharynx is  clear.  NECK:  Neck is supple without JVD or bruits.  CHEST:  Chest is clear.  No palpable chest pain.  CARDIAC:  Exam reveals a regular rhythm without murmur or gallop.  ABDOMEN:  Abdomen is soft and nontender.  Bowel sounds are normal.  Well-  healed abdominal scars secondary to previous surgeries.   EXTREMITIES:  Patient's extremities have a trace of edema at the ankles.  SKIN:  She has a scar over her left knee from her left total knee  replacement that is well-healed with some keloid formation.  Skin is without  rashes.  NEUROLOGICAL EXAM:  Oriented x3 but nonfocal.   LABORATORY AND ACCESSORY CLINICAL DATA:  A chest x-ray is pending.   EKG reveals sinus bradycardia of 56, no ST segment depression or elevation.   Pro time is 13 seconds.  PTT is prolonged on IV heparin at 100 mL/sec.  LFTs  are normal.  Albumin is 3.0.  First CK, CK-MB and troponin I are 140, 5.0  and 0.03, respectively.  Urinalysis is normal.  Electrolytes reveal a sodium  of 136, potassium 3.8, chloride 99, bicarb 30, BUN 12, creatinine 0.7, blood  sugar 147.  CBC is pending, TSH is pending and magnesium level is pending.   ASSESSMENT:  This is a 77 year old female with chest pains which quite  possibly are ischemic secondary to ischemic heart disease.  This also could  be reflux with spasm.  There is a very strong family history for coronary  artery disease.  She has a history of asthma, but also on atenolol.  It may  be reasonable to switch to diltiazem.   PLAN:  1. Chest pain -- treat with IV nitroglycerin, Lovenox and nasal cannula O2.     Check serial CPKs and obtain cardiology consult.  Dr. Deloris Ping Nahser     was contacted.  We will also schedule an adenosine Cardiolite.  2. Hypertension is okay -- again, consider changing atenolol to diltiazem.  3. Fibromyalgia -- continue Lexapro and Darvocet.  4. Allergies -- continue medicines as mentioned.  5. Muscle cramps -- continue potassium chloride and add magnesium oxide and     check calcium level.  6. Check fasting lipid profile.  7. Asthma -- on prednisone taper, currently 20 mg a day x7 days, then 10 mg  a day x7 days.  8. Headaches -- likely secondary to nitroglycerin.  Try Tylenol or Darvocet.                                                Candyce Churn, M.D.    RNG/MEDQ  D:  11/03/2003  T:  11/04/2003  Job:  [id number]   cc:   Vesta Mixer, M.D.  [address]  [place]  [place] [address]  Fax: [phone]   Georgann Housekeeper, M.D.  [address]  [place]  [place] [address]  Fax: [phone]

## 2010-12-04 NOTE — Procedures (Signed)
[place]  Patient:    [name], [name] Visit Number: [id number] MRN: [id number]          Service Type: SUR Location: [id number] Attending Physician:  Burnard Bunting Dictated by:   Edwin Cap. Zoila Shutter, M.D. Proc. Date: 04/04/01 Admit Date:  04/04/2001   CC:         Cammy Copa, M.D.  Department of Anesthesia   Procedure Report  DATE OF BIRTH:  Nov 24, 1931.  HISTORY:  Ms. 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  DESCRIPTION OF PROCEDURE:  After termination of the surgery while still under general anesthesia, Ms. Mccay was turned to the left lateral decubitus position.  Her back was prepped with Betadine and draped in the usual sterile fashion.  Using a paramedian approach, a paradural tap was accomplished at the L1-2 interspace with a 17-gauge Tuohy needle.  After aspiration of blood and CSF were negative, a total of 10 cc of 0.5% lidocaine with 100 mcg of fentanyl was infused.  This was followed by the passage of a paradural catheter 10 cm cephalad.  The catheter was then checked for patency and affixed with tape to the back.  The patient was then returned to the supine position, awakened, and brought to the postanesthesia care unit, where her paradural catheter will be connected to an infusion pump with a mixture of fentanyl 5 mcg/cc and Marcaine one-sixteenth percent at an initial rate of 12 cc/hr., to be adjusted as needed.  There were no complications.  She did well and will be followed in the usual fashion. Dictated by:   Edwin Cap. Zoila Shutter, M.D. Attending Physician:  Burnard Bunting DD:  04/04/01 TD:  04/05/01 Job: [id number]

## 2010-12-04 NOTE — Assessment & Plan Note (Signed)
[facility]                               PULMONARY OFFICE NOTE   NAME:[name]                   MRN:          [id number]  DATE:02/04/2006                            DOB:          May 16, 1932    HISTORY:  This is a very frustrated 77 year old black female whom I have  seen multiple times since 2003 when she developed a cough with productive of  minimal white sputum that was so bad she would vomit.  I had last saw her  on April 24, 2004 - 100% improved using the medication calendar that  correlate 100% with her medications and convinced that she did not have any  evidence of asthma.  Since she was taking Singulair I asked her to stop it  and then check back with me if she had any flare-up of her symptoms.  She  states within a couple of weeks of seeing me none of the medicines you gave  me worked any more and she has been from one doctor to the other being  treated for the cough with no benefit.  She says the only time gets any  better at all is in fact when she gets prednisone and then only for a few  days.   I attempted to review the medication with her again.  She did not bring the  medication calendar that she had previously.  I know that she has changed  doctors several times now - sees Dr. Clelia Croft for regular medical care.   PHYSICAL EXAMINATION:  GENERAL:  She is a hoarse, ambulatory black female  constantly clearing her throat during exam.  She is afebrile.  HEENT:  Unremarkable. Nasal turbinates normal.  Oropharynx is clear.  No  excessive postnasal drainage or cobblestoning.  NECK:  Supple without cervical adenopathy or tenderness.  Trachea is  midline.  No thyromegaly.  LUNGS: Lung fields perfectly clear bilaterally to auscultation and  percussion.  Regular rhythm without murmur, gallop or rub.  ABDOMEN:  Soft, benign.  EXTREMITIES:  Without calf tenderness, cyanosis or clubbing.   Sinus CT scans were reviewed for the patient  from April 07, 2004 and is  positive showing evidence of acute right maxillary sinusitis.   IMPRESSION:  This patient probably has postnasal drip syndrome secondary to  sinusitis but then gets coughing so bad she vomits indicating a reflux  component as well.  That is, postnasal drip syndrome probably causes the  coughing and then coughing causes reflux contributing to more coughing.  That is why it is difficult to eradicate all of the symptoms consistently  and keep them under control.  That is not treating reflux or rhinitis  aggressively and consistently is probably the reason she exacerbates.  I  suspect a compliance issue here as well.  I have now had the same  conversation, for instance with her three times.  This is now the third time  I have this same conversation emphasizing if she is placed on a maintenance  regimen here and the cough exacerbates she should not wait a year and a half  to  see [place].  Discussion with this patient lasted 20 minutes of a 25 minute visit.   PLAN:  1.  She agreed to return to work with me to manage the cough but we will      start by presenting her medicines as she did before in two bags, those      that she uses perfectly regular and those that she uses p.r.n. and be      started on Singulair 10 mg 1 q.p.m. today along with a six day course of      prednisone and a flyer regarding the optimal management of reflux (note      that she actively uses Halls cough drops which have menthol in them).  2.  I have also recommended a followup computerized tomography scan and we      will see her back here in a week with the results of this study.                                   [name]. Sherene Sires, MD, [facility]   MBW/MedQ  DD:  02/04/2006  DT:  02/04/2006  Job #:  [id number]

## 2010-12-04 NOTE — Assessment & Plan Note (Signed)
[facility]                               PULMONARY OFFICE NOTE   NAME:Kindle, SACHE SANE                   MRN:          [id number]  DATE:03/10/2006                            DOB:          Dec 31, 1931    HISTORY:  A 77 year old black female says she is constantly coughing.  This is the history she typically gives when she comes to the office, even  when I demonstrated on her previous visit that her cough could be totally  eradicated by staying on the medications that she was placed on.  The cough  is so severe that she vomits.  She reports the cough has returned and has  been worse for the last 3 weeks,  after totally resolving according to my  previous notes on treatment directed at rhinitis and reflux.  The intention  was for her to stay on Singulair between visits, which she failed to do.  She also failed to bring back with her the medication calendar with which  she was provided, which had both her maintenance and contingency  instructions.   Presently she has no productive sputum, dyspnea, fevers, chills, sweats,  cough, orthopnea, PND, or leg swelling.   PHYSICAL EXAMINATION:  On physical examination she is a very animated,  ambulatory, black female in no acute distress who struggles to answer  questions regarding medications.  She had stable vital signs.  HEENT:  Is remarkable for moderate turbinate edema, but no sinusitis or  polyps.  Oropharynx is clear.  NECK:  Supple, without cervical adenopathy or tenderness.  Trachea is  midline.  No thyromegaly.  LUNGS:  Lung fields reveal trace end expiratory wheeze, but this is more  upper airway than lower airway and not associated with coughing.  HEART:  Is regular rhythm.  ABDOMEN:  Soft, benign.  EXTREMITIES:  Warm and dry without calf tenderness, cyanosis, clubbing,  edema.   IMPRESSION:  Apparent steroid responsive cough that is not maintained  between visits and under control and  therefore represents with the issue  being whether there is enough allergic rhinitis and/or asthma to warrant  more chronic therapy, possibly in the form of an inhaled steroid.  Qvar  would be my choice for this.   Before I consider this though, I would strongly advise that this patient  return for full medication reconciliation to make sure that she is in fact  taking all the medicines between visits that were intended as maintenance,  and also understanding better how to use the p.r.n. to control symptoms  between visits.  I spent almost 30 minutes on this issue again today,  providing her samples where possible, and arranging followup for within 2  weeks for medication reconciliation,  and then at 4 weeks for PFTs to decide whether to start Qvar, but only if I  have confidence that she is following the instructions on the medicine she  has.  [name]. Sherene Sires, MD, [facility]   MBW/MedQ  DD:  03/10/2006  DT:  03/11/2006  Job #:  [id number]   cc:   Kari Baars, MD

## 2010-12-04 NOTE — Discharge Summary (Signed)
NAME:  [name], [name]            ACCOUNT NO.:  [id number]   MEDICAL RECORD NO.:  [id number]          PATIENT TYPE:  INP   LOCATION:  2921                         FACILITY:  [facility]   PHYSICIAN:  Santiago Bumpers. Hensel, M.D.DATE OF BIRTH:  Sep 29, 1931   DATE OF ADMISSION:  02/27/2005  DATE OF DISCHARGE:  03/01/2005                                 DISCHARGE SUMMARY   DISCHARGE DIAGNOSES:  1.  Chest pain of noncardiac origin.  2.  Hypertension.  3.  Depression.  4.  Hyperlipidemia.  5.  Gastroesophageal reflux disease.   DISCHARGE MEDICATIONS:  1.  Aspirin (81 mg) one tablet p.o. daily.  2.  Plavix (75 mg) one tablet p.o. daily.  3.  Atenolol (50 mg) one tablet p.o. daily.  4.  Hydrochlorothiazide (12.5 mg) one tablet p.o. daily.  5.  Imdur (30 mg) one tablet p.o. daily.  6.  Metoclopramide (10 mg) one tablet with meals and q.h.s.  7.  Nexium (40 mg) one tablet p.o. b.i.d. (continue until follow-up      appointment with GI).  8.  Norvasc (2.5 mg) three tablets daily.  9.  Potassium chloride (10 mEq) one tablet p.o. daily.  10. Trazodone (100 mg) one tablet p.o. q.h.s.  11. Zetia (10 mg) one tablet p.o. daily.  12. Nitroglycerin (0.4) one tablet p.o. every five minutes x3 with chest      pain.  13. Singulair (10 mg) one tablet p.o. daily.  14. Darvocet (10 mg) one tablet p.o. daily.   FOLLOWUP APPOINTMENT:  Dr. Adrian Blackwater at [facility] on  March 25, 2005, at 9:30 a.m.   CONSULTATIONS:  Dr. Elease Hashimoto (cardiology).   HOSPITAL COURSE:  Brief history, physical examination, and laboratory data  on admission: This is a 77 year old [place] American female with a history  of MI in April 2005, arteriosclerosis coronary, hypertension, who was  brought to the ED at [facility] by EMS. The patient was complaining of chest  pain 10/10. The patient has tried nitroglycerin times three at home with no  chest pain relief. The patient denied shortness of breath, nausea,  vomiting,  abdominal pain, diaphoresis, and palpitations. Nitroglycerin drip was  initiated in the ED with chest pain relieved from 10/10 to 3/10. Dr. Elease Hashimoto  (cardiology) was consulted. This the patient's private cardiologist. The  cardiologist did agree with ___________ and the patient was admitted to rule  out MI.   On physical examination the patient was afebrile, blood pressure 135 to 157  over 79 to 84.  O2 saturations were 95% to 97% on two liters of oxygen.  Physical examination was unremarkable except for bibasilar crackles on  respiratory auscultation, and 1/10 left lower quadrant tenderness on  palpation. Also some trace edema in the lower extremities.   LABORATORY DATA ON ADMISSION:  INR 1, PTT 26, prothrombin time 13.2.  Cardiac enzymes POC: Troponin-I less than 0.05 times three, and myoglobin  47, 35.4, and 38.5. CK-MB 1.1, less than 2, less than 2.   Chest x-ray showed no active cardiopulmonary disease. An EKG showed NSR, no  ischemic changes.   Hemoglobin 15.3, hematocrit  39. BMP: Sodium 138, potassium 3.4, chloride  103, PCO2 32, BUN 8, creatinine 1. The patient was admitted to rule out  myocardial infarction.   PROBLEM LIST:  1.  Atypical chest pain: Cardiac enzymes were normal. EKG showed no ischemic      changes. Cardiology was consulted, Dr. Elease Hashimoto (cardiology). The patient      had a stress test with no ischemic changes done approximately three      weeks ago previous to this admission. Chest pain of cardiac origin was      ruled out. The patient does have positive history of GERD and complained      of waking up at night to cough. The patient also has a positive history      of orthopnea. The patient sleeps with one or two pillows. Typical chest      pain for this patient is most likely secondary to GI causes. The patient      on day of discharge was stable, vital signs were stable. Consider GI      consult and EGD to rule out GI causes of chest pain such as  GERD,      esophageal spasm. An FLP was performed and showed LDL of 106. The      patient is currently on Zetia secondary to not tolerating statins      (myalgia). Consider this patient DLO to less than 100 given this      patient's history of CAD (MI in April 2005), hypertension,      hyperlipidemia. We have increased the doses of Nexium to 40 mg one tab      p.o. b.i.d. until follow-up appointment with GI.  2.  Hypertension: Stable throughout the rest of hospitalization. The patient      is on atenolol, hydrochlorothiazide, Norvasc, and Imdur.  3.  Hyperlipidemia: An FLP showed cholesterol, LDL of 106, triglycerides 67,      HDL 57. A second FLP showed LDL of 93. The patient is to be discharged      home on Zetia 10 mg one tablet p.o. daily (home dose).  4.  Gastroesophageal reflux disease: The patient was on Protonix 40 mg one      tablet p.o. daily. Metoclopramide (10 mg) one tab p.o. with meals q.h.s.      The patient was discharged on Nexium (4 mg) one tablet p.o. b.i.d. and      followup consult with GI.  5.  Social: The patient lives by herself. The patient did ask for help at      home.  The patient was advised for home health assessment by nursing      agency of her choice as an outpatient. She will be followed by primary      care physician.      Henri Medal, MD    ______________________________  Santiago Bumpers Leveda Anna, M.D.    [place]end[place] Colonel  D:  03/01/2005  T:  03/01/2005  Job:  [id number]   cc:   Adrian Blackwater, MD  Fax: [phone]

## 2010-12-04 NOTE — Discharge Summary (Signed)
[place]  Patient:    [name] Visit Number: [id number] MRN: [id number]          Service Type: ECR Location: SACU 9381 [id number] Attending Physician:  Faith Rogue T Dictated by:   Cammy Copa, M.D. Admit Date:  04/11/2001 Discharge Date: 04/22/2001                             Discharge Summary  DISCHARGE DIAGNOSIS:  Left knee arthritis.  SECONDARY DIAGNOSES: 1. Hypertension. 2. Gastroesophageal reflux disease.  OPERATIONS/PROCEDURES:  Left total knee replacement performed April 04, 2001.  HISTORY OF PRESENT ILLNESS:  [name] is a 77 year old female with left knee pain.  She presents now for total knee arthroplasty after failure with conservative management.  HOSPITAL COURSE:  The patient was admitted to the orthopedic service on April 04, 2001.  At that time she underwent a left total knee arthroplasty.  Postop epidural was placed.  The patient was noted to have intact pedal pulses, dorsiflexion, plantar flexion and sensation in the left leg following the procedure.  She was started on CPM and physical therapy on postoperative day #1.  She made good progress with these modalities.  She was started on Coumadin for DVT prophylaxis following removal of the epidural catheter tip.  She had unremarkable recovery.  Hematocrit was 27.5 at that time of transfer.  Incision was intact and the patient was transferred to the rehab service in good condition.  She will continue with range of motion.  DISCHARGE MEDICATIONS:  These include preadmission medications plus Coumadin and Percocet for pain.  FOLLOW-UP:  She will follow up with me in about one to two weeks after discharge from the [facility]. Dictated by:   Cammy Copa, M.D. Attending Physician:  Faith Rogue T DD:  05/16/01 TD:  05/17/01 Job: [id number]

## 2010-12-04 NOTE — Consult Note (Signed)
NAME:  [name], [name]            ACCOUNT NO.:  [id number]   MEDICAL RECORD NO.:  [id number]          PATIENT TYPE:  INP   LOCATION:  2921                         FACILITY:  [facility]   PHYSICIAN:  Vesta Mixer, M.D. DATE OF BIRTH:  1931/11/17   DATE OF CONSULTATION:  02/28/2005  DATE OF DISCHARGE:                                   CONSULTATION   HISTORY OF PRESENT ILLNESS:  Ms. [name] is a 77 year old black female with a  history of hypertension and a small myocardial infarction last year.  She is  admitted with chest pain.   The patient was admitted with chest pain in April of 2005.  Catheterization  at that time revealed tortuous but fairly normal coronaries, except for the  distal posterior descending artery had a small abrupt occlusion that was  consistent with an embolus.  The left anterior descending artery and the  left circumflex artery were very tortuous but completely normal.  This was  most consistent with hypertensive cardiomyopathy.  The patient has done well  since that time.  She has continued to complain of intermittent episodes of  chest pain. These have largely been atypical.  She had a stress Cardiolite  several weeks ago which was negative for ischemia and revealed normal left  ventricular systolic function.   She presented last night with chest pain.  It was not relieved with  nitroglycerin.  Her EKG was basically unremarkable, and she was admitted for  further evaluation.  Since that time, her enzymes have been negative.  Her  EKG was unremarkable.   CURRENT MEDICATIONS:  1.  Aspirin 81 mg daily.  2.  Atenolol 50 mg daily.  3.  Hydrochlorothiazide 12.5 mg daily.  4.  Norvasc 7.5 mg daily.  5.  Plavix 75 mg daily.  6.  Potassium chloride 10 mEq daily.  7.  Zetia 10 mg daily.  8.  Imdur 30 mg daily.  9.  Reglan 10 mg q.a.c. and q.h.s.  10. Nexium 40 mg daily.  11. Nitroglycerin sublingually p.r.n.  12. Singulair 10 mg daily.  13. Trazodone 100 mg  q.h.s.  14. Zyrtec 10 mg daily.   ALLERGIES:  NO KNOWN DRUG ALLERGIES.   PAST MEDICAL HISTORY:  1.  Coronary artery disease.  She has tortuous coronary arteries consistent      with hypertension.  She had an embolus in the distal aspect of the      posterior descending artery last year.  2.  Hypertension.  3.  Atypical chest pain.  4.  Laparoscopic cholecystectomy.   SOCIAL HISTORY:  The patient is a nonsmoker.   FAMILY HISTORY:  Noncontributory.   REVIEW OF SYSTEMS:  She has frequent episodes of atypical chest pain.   PHYSICAL EXAMINATION:  GENERAL:  She is an elderly female in no acute  distress.  She is currently pain-free.  VITAL SIGNS:  Heart rate 60, blood pressure 135/85.  NECK:  No JVD or thyromegaly.  LUNGS:  Clear to auscultation.  HEART:  Regular rate, S1 and S2.  She has a very soft systolic outflow  murmur.  ABDOMEN:  Good  bowel sounds and is nontender.  EXTREMITIES:  No clubbing, cyanosis, or edema.  There is no calf tenderness.  NEUROLOGIC:  Nonfocal.   STUDIES:  EKG reveals normal sinus rhythm.  She has nonspecific ST and T  wave abnormalities.   LABORATORY DATA:  Her cardiac enzymes are negative x3.   IMPRESSION AND PLAN:  Chest pain.  I suspect that this is noncardiac in  origin.  Her electrocardiogram is largely unremarkable.  Her cardiac enzymes  are negative, and the Cardiolite from several weeks ago was negative for  ischemia.  I do agree with treating her as if this unstable angina until the  complete set of cardiac enzymes is back.  I would also like for her to be  worked up for other causes of noncardiac chest pain, including  gastrointestinal etiologies and perhaps others.       PJN/MEDQ  D:  02/28/2005  T:  02/28/2005  Job:  [id number]   cc:   [facility]

## 2010-12-04 NOTE — Procedures (Signed)
[facility]  Patient:    [name], [name]                   MRN: [id number] Proc. Date: 01/21/00 Adm. Date:  [id number] Disc. Date: [id number] Attending:  Sabino Gasser                           Procedure Report  PROCEDURE PERFORMED:  Colonoscopy.  ENDOSCOPIST:  Sabino Gasser, M.D.  INDICATIONS FOR PROCEDURE:  Colon cancer screening.  ANESTHESIA:  Demerol 50 mg, Versed 2.5 mg given intravenously in divided dose.  DESCRIPTION OF PROCEDURE:  With the patient mildly sedated in the left lateral decubitus position, the Olympus videoscopic colonoscope was inserted in the rectum and passed under direct vision into the cecum.  The cecum was identified by the ileocecal valve and appendiceal orifice, both of which were photographed.  The ileocecal valve was somewhat redundant and we were easily able to enter the terminal ileum which appeared normal and was photographed as well.  From this point, the colonoscope was slowly withdrawn, taking circumferential views of the entire colonic mucosa, stopping at approximately 20 cm from the anal verge at which point two small polyps were seen and photographed and using hot biopsy forceps technique, setting of 20/20 blended current they were removed.  The endoscope was then further withdrawn to the rectum, which appeared normal on direct and retroflex view.  The endoscope was straightened and withdrawn.  Patients vital signs and pulse oximeter remained stable.  The patient tolerated the procedure well and without apparent complications.  FINDINGS:  Small polyps at 20 cm from anal verge.  Await biopsy report. Patietn to call me for results and follow up with me as an outpatient. DD:  01/21/00 TD:  01/21/00 Job: [id number]

## 2010-12-04 NOTE — Op Note (Signed)
[facility]  Patient:    [name] Visit Number: [id number] MRN: [id number]          Service Type: SUR Location: [id number] Attending Physician:  Burnard Bunting Proc. Date: 04/04/01 Admit Date:  04/04/2001                             Operative Report  PREOPERATIVE DIAGNOSIS:  Left knee arthritis.  POSTOPERATIVE DIAGNOSIS:  Left knee arthritis.  PROCEDURE PERFORMED:  Left total knee arthroplasty utilizing osteonics posterior stabilized cemented #9 femur, #7 tibia with 10-mm spacer and size 9 resurfaced polyethylene patella.  SURGEON:  Cammy Copa, M.D.  ASSISTANT:  Colon Flattery. Ollen Bowl, M.D.  ANESTHESIA:  General endotracheal.  ESTIMATED BLOOD LOSS:  100 cc.  DRAINS:  Hemovac x 1.  INDICATIONS:  [name] is a 77 year old patient with left knee arthritis refractory to conservative management.  She presents now for operative therapy.  DESCRIPTION OF PROCEDURE:  The patient was brought to the operating room where general endotracheal anesthesia was induced.  Preoperative IV antibiotics were administered.  The left leg was prepped with Duraprep solution and draped in a sterile manner.  A proximal thigh tourniquet was placed.  The patient had about a 10-degree flexion contracture.  The left leg was then prepped with Duraprep solution and draped in a sterile manner.  An Collier Flowers was used to cover the operative field.  The leg was elevated and exsanguinated with the Esmarch wrap.  The tourniquet was inflated to 300 mmHg.  Total tourniquet time was one hour and 57 minutes.  An incision was made.  A median parapatellar approach was utilized.  A 0 Vicryl tagging suture was used to mark the incision in the quadriceps tendon just above the patella to ensure anatomic restoration at the time of closure.  The patellofemoral ligament was released.  A medial release of the MCL was performed.  The suprapatellar pouch fat was removed  from the distal anterior aspect of the femur.  At this time, the patella was everted. Osteophytes were removed from the medial and lateral sides with a rongeur.  A 12 mm distal femoral cut was performed in 5-degrees of valgus.  The sizing guide was then placed and the epicondylar access was used to confirm correct rotation.  The anterior, posterior, and Chamfer cuts were then performed. Femoral notching occurred.  Flexion gaps were symmetric.  At this time, the tibial cuts were performed using intramedullary alignment, and 10-mm was removed from the least affected lateral tibial plateau.  At this time, the femur was revisited, and the notch cut was performed.  A posterior capsular release was also performed.  Osteophytes were removed from the posterolateral and posterior femoral condyles.  The tibia was then Kiel punched and prepared.  Trial components were placed and the knee was found to have full extension and was found to have full flexion with lift off with a 10-mm spacer.  At this time, the patella was resurfaced to a size 9.  Pegs were drilled.  The cut bony surfaces were then irrigated.  The tibial tray including the kiel was cemented into position, the femur was cemented into position, and then the patella cemented into position.  Cement hardening occurring and excess was removed sharply.  No cement was left in the notch.  A 10-mm spacer was placed and the knee maintained excellent range of motion, stability, and full extension,  mid flexion had 90-degrees of flexion.  The patella tracked well with a no thumbs technique.  At this time, the tourniquet was released. Bleeding points were controlled using electrocautery.  A Hemovac was placed. The median parapatellar approach was anatomically restored using interrupted 0 Vicryl figure-of-eight suture.  The skin was closed using interrupted 2-0 Vicryl inverted sutures followed by skin staples.  Pedal pulses were palpable at the  conclusion of the case.  The patient was placed into a knee immobilizer and was transferred to the recovery room in stable condition.  Epidural was placed. Attending Physician:  Burnard Bunting DD:  04/04/01 TD:  04/05/01 Job: [id number]

## 2010-12-04 NOTE — H&P (Signed)
NAME:  [name], [name]            ACCOUNT NO.:  [id number]   MEDICAL RECORD NO.:  [id number]          PATIENT TYPE:  INP   LOCATION:  3733                         FACILITY:  [facility]   PHYSICIAN:  Francisca December, M.D.  DATE OF BIRTH:  13-Jul-1932   DATE OF ADMISSION:  04/23/2006  DATE OF DISCHARGE:                                HISTORY & PHYSICAL   REASON FOR ADMISSION:  Chest pain.   HISTORY OF PRESENT ILLNESS:  [name] is a 77 year old woman with a  remote prior history of apical MI from embolism.  She presented this morning  to [facility] Emergency Room at 0500 hours complaining of right-sided chest pain,  achy and dull in nature.  It radiated under the axilla and somewhat into the  right arm.  It was associated some dyspnea but no diaphoresis or nausea, no  vomiting.  It has now lasted approximately 3 hours as it began at about 3:30  in the morning.  It woke her from sleep.  She tried two nitroglycerin at  home which were old and it did not resolve.  She has had IV nitroglycerin up  to 30 mcg here in the emergency room with some improvement.   She has a history of chronic cough recently treated with prednisone by  pulmonary who has been seeing her regularly.  No history of other  intrathoracic pathology.   Of note, she underwent cardiac catheterization because of left-sided chest  pain and an abnormal Cardiolite in April 2007, that study showed systolic  hypertension, an EF of 65% and no significant coronary disease.  There was a  small apical knuckle or area of dyskinesis.   PAST MEDICAL HISTORY:  1. Hypertension.  2. Hyperlipidemia.  3. Osteoarthritis.  4. GERD.  5. Diverticulitis.  6. Remote history of MI April 2005.   CURRENT MEDICATIONS/THE DOSES ARE UNKNOWN ON THESE:  1. Aspirin.  2. Amantadine.  3. Atenolol felt to be 50 mg p.o. daily.  4. Reglan 10 mg p.o. q.i.d.  5. Plavix 75 mg p.o. daily.  6. Potassium chloride.  7. Prednisone.  8. Prevacid.  9.  Hydrochlorothiazide.   DRUG ALLERGIES:  SHE IS INTOLERANT OF LIPITOR.   SOCIAL HISTORY:  She never married, does have a son who lives in [place],  does not smoke or use alcohol.  She ambulates with a cane, is a retired  Advertising copywriter.  She does not drive.   FAMILY HISTORY:  Not contributory.   REVIEW OF SYSTEMS:  Otherwise negative except as mentioned above, chronic  cough is not exacerbated today.  No sputum production, no hemoptysis.  Her  cough is felt to be likely secondary to reflux.   PHYSICAL EXAMINATION:  VITAL SIGNS:  The blood pressure is 154/77, pulse is  86 and regular, respiratory rate is 20, temperature 98.2, O2 saturation on  room air 100%.  GENERAL:  This is a comfortable-appearing 77 year old African American woman  in no distress.  HEENT:  Unremarkable.  Head is atraumatic and normocephalic.  Pupils equal,  round and react to light and accommodation.  Extraocular movements are  intact.  Oral  mucosa is pink and moist.  Teeth are in poor repair. The  tongue is not coated.  NECK:  The neck is supple without thyromegaly or masses.  The carotid  upstrokes are normal.  There is no bruit.  There is no JVD.  CHEST:  Her chest is clear with adequate excursion.  No wheezes, rales or  rhonchi.  The heart has regular rhythm.  Normal S1 and S2.  There is an  ejection systolic murmur present along the left sternal border to the apex.  It is grade 2/6.  There is a scratchy sound at the end of systole that may  represent a rub but it would have to be single component.  The PMI is not  palpable.  ABDOMEN:  Soft, nontender.  No hepatosplenomegaly or midline pulsatile mass.  EXTERNAL GENITALIA:  Atrophic.  RECTAL:  Not performed.  EXTREMITIES:  Full range of motion.  No edema.  Intact distal pulses.  NEUROLOGICAL:  Cranial nerves II-XII are intact.  Motor and sensory grossly  intact.  Gait not tested.  SKIN:  Warm, dry and clear.   ACCESSORY CLINICAL DATA:  Admission hemogram  shows hematocrit to be 38 and  hemoglobin to be 12.9.  Serum electrolytes and BUN are normal, creatinine is  0.7.  Initial CK-MB and troponin are all negative.   Electrocardiogram shows sinus rhythm and nonspecific T-wave abnormality, no  specific change from prior tracing.   Chest x-rays:  No acute findings, cardiac silhouette with normal size.   IMPRESSION:  1. Right-sided chest pain, etiology uncertain, but the patient does have a      history of normal coronary arteries and catheterization done 6 months      ago, also with intact left ventricular systolic function.  2. Questionable pericardial rub versus a very early high-pitched diastolic      murmur.  3. Hypertension.  4. Resting tachycardia.  5. Chronic cough.  6. Hyperlipidemia.  7. Osteoarthritis, primarily in both knees.  8. Gastroesophageal reflux disease.  9. History of diverticulitis.  10.History of myocardial infarction by embolic phenomenon April 2005.   PLAN:  1. The patient is admitted to telemetry monitoring.  2. Obtain serial CK-MB, troponin enzymes.  3. Check a D-dimer.  4. Ten milligrams of IV metoprolol have been administered now.  5. Dilaudid 1 mg administered now.  6. Titrate nitroglycerin as needed for pain, maintain systolic blood      pressure above 110.  7. We will follow her exam and reexamination for this questionable rub,      consider increasing steroids if necessary.  8. We will administer home medications per reconciliation sheet once      completed      Francisca December, M.D.  Electronically Signed     JHE/MEDQ  D:  04/23/2006  T:  04/24/2006  Job:  [id number]   cc:   Vesta Mixer, M.D.  William A. Leveda Anna, M.D.

## 2010-12-21 ENCOUNTER — Encounter (HOSPITAL_BASED_OUTPATIENT_CLINIC_OR_DEPARTMENT_OTHER): Payer: Medicare Other | Admitting: Oncology

## 2010-12-21 ENCOUNTER — Other Ambulatory Visit: Payer: Self-pay | Admitting: Oncology

## 2010-12-21 DIAGNOSIS — I10 Essential (primary) hypertension: Secondary | ICD-10-CM

## 2010-12-21 DIAGNOSIS — D72819 Decreased white blood cell count, unspecified: Secondary | ICD-10-CM

## 2010-12-21 DIAGNOSIS — D709 Neutropenia, unspecified: Secondary | ICD-10-CM

## 2010-12-21 DIAGNOSIS — E785 Hyperlipidemia, unspecified: Secondary | ICD-10-CM

## 2010-12-21 LAB — CBC WITH DIFFERENTIAL/PLATELET
BASO%: 0.3 % (ref 0.0–2.0)
Basophils Absolute: 0 10*3/uL (ref 0.0–0.1)
EOS%: 0.7 % (ref 0.0–7.0)
Eosinophils Absolute: 0 10*3/uL (ref 0.0–0.5)
HCT: 35.9 % (ref 34.8–46.6)
HGB: 11.8 g/dL (ref 11.6–15.9)
LYMPH%: 52.6 % — ABNORMAL HIGH (ref 14.0–49.7)
MCH: 23.6 pg — ABNORMAL LOW (ref 25.1–34.0)
MCHC: 32.9 g/dL (ref 31.5–36.0)
MCV: 71.7 fL — ABNORMAL LOW (ref 79.5–101.0)
MONO#: 0.3 10*3/uL (ref 0.1–0.9)
MONO%: 11.1 % (ref 0.0–14.0)
NEUT#: 1 10*3/uL — ABNORMAL LOW (ref 1.5–6.5)
NEUT%: 35.3 % — ABNORMAL LOW (ref 38.4–76.8)
Platelets: 184 10*3/uL (ref 145–400)
RBC: 5.01 10*6/uL (ref 3.70–5.45)
RDW: 15.2 % — ABNORMAL HIGH (ref 11.2–14.5)
WBC: 2.9 10*3/uL — ABNORMAL LOW (ref 3.9–10.3)
lymph#: 1.5 10*3/uL (ref 0.9–3.3)
nRBC: 0 % (ref 0–0)

## 2011-02-03 ENCOUNTER — Other Ambulatory Visit: Payer: Self-pay | Admitting: Internal Medicine

## 2011-02-03 DIAGNOSIS — Z1231 Encounter for screening mammogram for malignant neoplasm of breast: Secondary | ICD-10-CM

## 2011-02-24 ENCOUNTER — Encounter (HOSPITAL_BASED_OUTPATIENT_CLINIC_OR_DEPARTMENT_OTHER): Payer: Medicare Other | Admitting: Oncology

## 2011-02-24 ENCOUNTER — Other Ambulatory Visit: Payer: Self-pay | Admitting: Oncology

## 2011-02-24 DIAGNOSIS — D709 Neutropenia, unspecified: Secondary | ICD-10-CM

## 2011-02-24 DIAGNOSIS — I10 Essential (primary) hypertension: Secondary | ICD-10-CM

## 2011-02-24 DIAGNOSIS — D72819 Decreased white blood cell count, unspecified: Secondary | ICD-10-CM

## 2011-02-24 DIAGNOSIS — E785 Hyperlipidemia, unspecified: Secondary | ICD-10-CM

## 2011-02-24 LAB — CBC WITH DIFFERENTIAL/PLATELET
BASO%: 0.3 % (ref 0.0–2.0)
Basophils Absolute: 0 10*3/uL (ref 0.0–0.1)
EOS%: 3.6 % (ref 0.0–7.0)
Eosinophils Absolute: 0.1 10*3/uL (ref 0.0–0.5)
HCT: 34.3 % — ABNORMAL LOW (ref 34.8–46.6)
HGB: 11 g/dL — ABNORMAL LOW (ref 11.6–15.9)
LYMPH%: 56.7 % — ABNORMAL HIGH (ref 14.0–49.7)
MCH: 23.5 pg — ABNORMAL LOW (ref 25.1–34.0)
MCHC: 32.1 g/dL (ref 31.5–36.0)
MCV: 73.1 fL — ABNORMAL LOW (ref 79.5–101.0)
MONO#: 0.4 10*3/uL (ref 0.1–0.9)
MONO%: 11.9 % (ref 0.0–14.0)
NEUT#: 0.9 10*3/uL — ABNORMAL LOW (ref 1.5–6.5)
NEUT%: 27.5 % — ABNORMAL LOW (ref 38.4–76.8)
Platelets: 166 10*3/uL (ref 145–400)
RBC: 4.69 10*6/uL (ref 3.70–5.45)
RDW: 15 % — ABNORMAL HIGH (ref 11.2–14.5)
WBC: 3.4 10*3/uL — ABNORMAL LOW (ref 3.9–10.3)
lymph#: 1.9 10*3/uL (ref 0.9–3.3)
nRBC: 0 % (ref 0–0)

## 2011-03-18 ENCOUNTER — Encounter: Payer: Self-pay | Admitting: Cardiovascular Disease

## 2011-03-25 ENCOUNTER — Ambulatory Visit: Payer: Medicare Other | Admitting: Cardiovascular Disease

## 2011-03-29 ENCOUNTER — Other Ambulatory Visit (INDEPENDENT_AMBULATORY_CARE_PROVIDER_SITE_OTHER): Payer: Medicare Other | Admitting: *Deleted

## 2011-03-29 ENCOUNTER — Other Ambulatory Visit: Payer: Self-pay | Admitting: Cardiovascular Disease

## 2011-03-29 ENCOUNTER — Other Ambulatory Visit: Payer: Self-pay | Admitting: *Deleted

## 2011-03-29 DIAGNOSIS — Z0000 Encounter for general adult medical examination without abnormal findings: Secondary | ICD-10-CM

## 2011-03-29 DIAGNOSIS — E785 Hyperlipidemia, unspecified: Secondary | ICD-10-CM

## 2011-03-29 DIAGNOSIS — I10 Essential (primary) hypertension: Secondary | ICD-10-CM

## 2011-03-29 LAB — BASIC METABOLIC PANEL
BUN: 13 mg/dL (ref 6–23)
CO2: 29 mEq/L (ref 19–32)
Calcium: 9.2 mg/dL (ref 8.4–10.5)
Chloride: 101 mEq/L (ref 96–112)
Creatinine, Ser: 0.6 mg/dL (ref 0.4–1.2)
GFR: 124.52 mL/min (ref >60.00)
Glucose, Bld: 77 mg/dL (ref 70–99)
Potassium: 3.8 mEq/L (ref 3.5–5.1)
Sodium: 138 mEq/L (ref 135–145)

## 2011-03-29 LAB — LIPID PANEL
Cholesterol: 203 mg/dL — ABNORMAL HIGH (ref 0–200)
HDL: 79.5 mg/dL (ref >39.00)
Total CHOL/HDL Ratio: 3
Triglycerides: 24 mg/dL (ref 0.0–149.0)
VLDL: 4.8 mg/dL (ref 0.0–40.0)

## 2011-03-29 LAB — HEPATIC FUNCTION PANEL
ALT: 20 U/L (ref 0–35)
AST: 36 U/L (ref 0–37)
Albumin: 3.7 g/dL (ref 3.5–5.2)
Alkaline Phosphatase: 74 U/L (ref 39–117)
Bilirubin, Direct: 0.1 mg/dL (ref 0.0–0.3)
Total Bilirubin: 0.7 mg/dL (ref 0.3–1.2)
Total Protein: 6.1 g/dL (ref 6.0–8.3)

## 2011-03-30 LAB — LDL CHOLESTEROL, DIRECT: Direct LDL: 109.5 mg/dL

## 2011-03-31 ENCOUNTER — Telehealth: Payer: Self-pay | Admitting: *Deleted

## 2011-03-31 NOTE — Telephone Encounter (Signed)
Called msg of normal lab results, she may call back for result numbers.

## 2011-03-31 NOTE — Telephone Encounter (Signed)
Message copied by Antony Odea on Wed Mar 31, 2011  3:34 PM ------      Message from: Antony Odea      Created: Wed Mar 31, 2011 12:43 PM                   ----- Message -----         From: Lab In [facility] Interface         Sent: 03/30/2011  10:52 AM           To: Elyn Aquas., MD

## 2011-04-01 ENCOUNTER — Ambulatory Visit (INDEPENDENT_AMBULATORY_CARE_PROVIDER_SITE_OTHER): Payer: Medicare Other | Admitting: Cardiovascular Disease

## 2011-04-01 ENCOUNTER — Encounter: Payer: Self-pay | Admitting: Cardiovascular Disease

## 2011-04-01 DIAGNOSIS — R079 Chest pain, unspecified: Secondary | ICD-10-CM | POA: Insufficient documentation

## 2011-04-01 DIAGNOSIS — I10 Essential (primary) hypertension: Secondary | ICD-10-CM

## 2011-04-01 NOTE — Progress Notes (Signed)
[name] Date of Birth  12-21-1931 [facility] [address]     [address] [place], [place]  [address] [phone]  Fax  [phone]  History of Present Illness:  [name] is an elderly female with a history of chest pain with minimal coronary artery disease. She has a history of hyperlipidemia chronic cough she status post Nissen fundoplication for acid reflux. She has a history of hypertension.  She's continued to have some episodes of chest discomfort. These are fairly atypical.  Current Outpatient Prescriptions on File Prior to Visit  Medication Sig Dispense Refill  . aspirin 81 MG tablet Take 81 mg by mouth daily.        . Calcium Citrate (CITRACAL PO) Take by mouth 2 (two) times daily.        . Cholecalciferol (VITAMIN D-3 PO) Take by mouth. Taking Weekly       . levothyroxine (SYNTHROID, LEVOTHROID) 125 MCG tablet Take 125 mcg by mouth daily.        . nitroGLYCERIN (NITROSTAT) 0.4 MG SL tablet Place 0.4 mg under the tongue every 5 (five) minutes as needed.        Marland Kitchen POTASSIUM PO Take 20 mg by mouth.        . Probiotic Product (ALIGN PO) Take by mouth daily.        Marland Kitchen telmisartan (MICARDIS) 80 MG tablet Take 80 mg by mouth daily.        . TRAZODONE HCL PO Take 100 mg by mouth.        . vitamin C (ASCORBIC ACID) 500 MG tablet Take 500 mg by mouth daily.          Allergies  Allergen Reactions  . Doxycycline     REACTION: itching/rash  . Prednisone     REACTION: itching  . Statins     Past Medical History  Diagnosis Date  . Pneumonia     Past Surgical History  Procedure Date  . US echocardiography 06-30-2005    Est EF 55-60%  . Cardiovascular stress test 08-15-2007    EF 59%  . Hysterotomy   . Appendectomy   . Cholecystectomy open   . Tonsillectomy     History  Smoking status  . Never Smoker   Smokeless tobacco  . Not on file    History  Alcohol Use: Not on file    Family History  Problem Relation  Age of Onset  . Hypertension Sister   . Hypertension Brother     Reviw of Systems:  Reviewed in the HPI.  All other systems are negative.  Physical Exam: BP 118/80  Pulse 80  Ht 5\' 3"  (1.6 m)  Wt 140 lb 6.4 oz (63.685 kg)  BMI 24.87 kg/m2 The patient is alert and oriented x 3.  The mood and affect are normal.   Skin: warm and dry.  Color is normal.    HEENT:   the sclera are nonicteric.  The mucous membranes are moist.  The carotids are 2+ without bruits.  There is no thyromegaly.  There is no JVD.    Lungs: clear.  The chest wall is non tender.    Heart: regular rate with a normal S1 and S2.  There are no murmurs, gallops, or rubs. The PMI is not displaced.     Abdomen: good bowel sounds.  There is no guarding or rebound.  There is no hepatosplenomegaly or tenderness.  There are no masses.  Extremities:  no clubbing, cyanosis, or edema.  The legs are without rashes.  The distal pulses are intact.   Neuro:  Cranial nerves II - XII are intact.  Motor and sensory functions are intact.    The gait is normal.   Assessment / Plan:

## 2011-04-01 NOTE — Assessment & Plan Note (Addendum)
She continues to have some intermittent episodes of chest discomfort. These do not sound like angina. She's had a stress test in the past which was normal. She's had a heart catheterization in the past which revealed relatively normal coronary but did look like she had a small embolus in the terminal posterior descending artery in the  right coronary artery.  We will continue to follow her. If her chest pains get worse, we will repeat the stress test.

## 2011-04-01 NOTE — Assessment & Plan Note (Signed)
Her blood pressure remains well-controlled. We will continue with her same medication.

## 2011-04-07 LAB — POCT CARDIAC MARKERS
CKMB, poc: 1.1
CKMB, poc: <1 — ABNORMAL LOW
Myoglobin, poc: 49.7
Myoglobin, poc: 66.9
Operator id: 196461
Operator id: 284141
Troponin i, poc: <0.05
Troponin i, poc: <0.05

## 2011-04-07 LAB — CBC
HCT: 35.8 — ABNORMAL LOW
Hemoglobin: 11.6 — ABNORMAL LOW
MCHC: 32.4
MCV: 74.8 — ABNORMAL LOW
Platelets: 184
RBC: 4.78
RDW: 15.5
WBC: 5.8

## 2011-04-07 LAB — DIFFERENTIAL
Basophils Absolute: 0
Basophils Relative: 0
Eosinophils Absolute: 0
Eosinophils Relative: 0
Lymphocytes Relative: 11 — ABNORMAL LOW
Lymphs Abs: 0.7
Monocytes Absolute: 0.1
Monocytes Relative: 2 — ABNORMAL LOW
Neutro Abs: 5
Neutrophils Relative %: 86 — ABNORMAL HIGH

## 2011-04-07 LAB — BASIC METABOLIC PANEL
BUN: 9
CO2: 28
Calcium: 9
Chloride: 98
Creatinine, Ser: 0.65
GFR calc Af Amer: >60
GFR calc non Af Amer: >60
Glucose, Bld: 91
Potassium: 3.8
Sodium: 134 — ABNORMAL LOW

## 2011-04-08 LAB — DIFFERENTIAL
Basophils Absolute: 0.1
Basophils Relative: 1
Eosinophils Absolute: 0
Eosinophils Relative: 0
Lymphocytes Relative: 25
Lymphs Abs: 1.4
Monocytes Absolute: 0.4
Monocytes Relative: 8
Neutro Abs: 3.7
Neutrophils Relative %: 66

## 2011-04-08 LAB — BASIC METABOLIC PANEL
BUN: 7
CO2: 34 — ABNORMAL HIGH
Calcium: 9.3
Chloride: 100
Creatinine, Ser: 0.79
GFR calc Af Amer: >60
GFR calc non Af Amer: >60
Glucose, Bld: 107 — ABNORMAL HIGH
Potassium: 3.9
Sodium: 139

## 2011-04-08 LAB — COMPREHENSIVE METABOLIC PANEL
ALT: 19
AST: 24
Albumin: 3.8
Alkaline Phosphatase: 68
BUN: 7
CO2: 33 — ABNORMAL HIGH
Calcium: 9.3
Chloride: 95 — ABNORMAL LOW
Creatinine, Ser: 0.69
GFR calc Af Amer: >60
GFR calc non Af Amer: >60
Glucose, Bld: 93
Potassium: 4.3
Sodium: 134 — ABNORMAL LOW
Total Bilirubin: 0.8
Total Protein: 6.9

## 2011-04-08 LAB — CBC
HCT: 37.1
Hemoglobin: 12
MCHC: 32.4
MCV: 74.2 — ABNORMAL LOW
Platelets: 222
RBC: 5.01
RDW: 15.8 — ABNORMAL HIGH
WBC: 5.6

## 2011-04-08 LAB — HEMOGLOBIN AND HEMATOCRIT, BLOOD
HCT: 36.6
Hemoglobin: 12

## 2011-04-08 LAB — POCT CARDIAC MARKERS
CKMB, poc: 1.6
Myoglobin, poc: 60.3
Operator id: 4295
Troponin i, poc: <0.05

## 2011-04-08 LAB — LIPASE, BLOOD: Lipase: 18

## 2011-04-12 LAB — BASIC METABOLIC PANEL
BUN: 13
CO2: 33 — ABNORMAL HIGH
Calcium: 9.7
Chloride: 103
Creatinine, Ser: 0.88
GFR calc Af Amer: >60
GFR calc non Af Amer: >60
Glucose, Bld: 87
Potassium: 5.2 — ABNORMAL HIGH
Sodium: 141

## 2011-04-12 LAB — HEMOGLOBIN AND HEMATOCRIT, BLOOD
HCT: 39.5
Hemoglobin: 12.8

## 2011-04-23 LAB — COMPREHENSIVE METABOLIC PANEL
ALT: 20
AST: 30
Albumin: 3.4 — ABNORMAL LOW
Alkaline Phosphatase: 63
BUN: 10
CO2: 22
Calcium: 8.7
Chloride: 99
Creatinine, Ser: 0.62
GFR calc Af Amer: >60
GFR calc non Af Amer: >60
Glucose, Bld: 66 — ABNORMAL LOW
Potassium: 3.8
Sodium: 130 — ABNORMAL LOW
Total Bilirubin: 0.8
Total Protein: 5.9 — ABNORMAL LOW

## 2011-04-23 LAB — CARDIAC PANEL(CRET KIN+CKTOT+MB+TROPI)
CK, MB: 3
CK, MB: 3.3
Relative Index: 1.3
Relative Index: 1.3
Total CK: 230 — ABNORMAL HIGH
Total CK: 251 — ABNORMAL HIGH
Troponin I: 0.03
Troponin I: 0.03

## 2011-04-23 LAB — I-STAT 8, (EC8 V) (CONVERTED LAB)
Acid-Base Excess: 1
BUN: 11
Bicarbonate: 25.1 — ABNORMAL HIGH
Chloride: 98
Glucose, Bld: 98
HCT: 38
Hemoglobin: 12.9
Operator id: 161631
Potassium: 3.6
Sodium: 130 — ABNORMAL LOW
TCO2: 26
pCO2, Ven: 38.8 — ABNORMAL LOW
pH, Ven: 7.419 — ABNORMAL HIGH

## 2011-04-23 LAB — CBC
HCT: 33.6 — ABNORMAL LOW
HCT: 36.2
Hemoglobin: 11 — ABNORMAL LOW
Hemoglobin: 11.7 — ABNORMAL LOW
MCHC: 32.3
MCHC: 32.7
MCV: 73.4 — ABNORMAL LOW
MCV: 74.5 — ABNORMAL LOW
Platelets: 185
Platelets: 189
RBC: 4.57
RBC: 4.85
RDW: 15
RDW: 15.2
WBC: 4
WBC: 4.2

## 2011-04-23 LAB — POCT I-STAT CREATININE
Creatinine, Ser: 0.7
Operator id: 161631

## 2011-04-23 LAB — DIFFERENTIAL
Basophils Absolute: 0
Basophils Relative: 1
Eosinophils Absolute: 0.1 — ABNORMAL LOW
Eosinophils Relative: 1
Lymphocytes Relative: 48 — ABNORMAL HIGH
Lymphs Abs: 1.9
Monocytes Absolute: 0.5
Monocytes Relative: 13 — ABNORMAL HIGH
Neutro Abs: 1.4 — ABNORMAL LOW
Neutrophils Relative %: 36 — ABNORMAL LOW

## 2011-04-23 LAB — POCT CARDIAC MARKERS
CKMB, poc: 1.2
CKMB, poc: 2.1
Myoglobin, poc: 77.5
Myoglobin, poc: 94.5
Operator id: 161631
Operator id: 161631
Troponin i, poc: <0.05
Troponin i, poc: <0.05

## 2011-04-23 LAB — CK TOTAL AND CKMB (NOT AT ARMC)
CK, MB: 4.3 — ABNORMAL HIGH
Relative Index: 1.4
Total CK: 316 — ABNORMAL HIGH

## 2011-04-23 LAB — PROTIME-INR
INR: 0.9
Prothrombin Time: 12.2

## 2011-04-23 LAB — APTT: aPTT: 27

## 2011-04-23 LAB — TROPONIN I: Troponin I: 0.01

## 2011-04-26 ENCOUNTER — Other Ambulatory Visit: Payer: Self-pay | Admitting: Internal Medicine

## 2011-04-26 ENCOUNTER — Encounter (HOSPITAL_BASED_OUTPATIENT_CLINIC_OR_DEPARTMENT_OTHER): Payer: Medicare Other | Admitting: Oncology

## 2011-04-26 DIAGNOSIS — E785 Hyperlipidemia, unspecified: Secondary | ICD-10-CM

## 2011-04-26 LAB — CBC WITH DIFFERENTIAL/PLATELET
BASO%: 1 % (ref 0.0–2.0)
Basophils Absolute: 0 10*3/uL (ref 0.0–0.1)
EOS%: 1.8 % (ref 0.0–7.0)
Eosinophils Absolute: 0.1 10*3/uL (ref 0.0–0.5)
HCT: 34 % — ABNORMAL LOW (ref 34.8–46.6)
HGB: 11.2 g/dL — ABNORMAL LOW (ref 11.6–15.9)
LYMPH%: 56.5 % — ABNORMAL HIGH (ref 14.0–49.7)
MCH: 24.9 pg — ABNORMAL LOW (ref 25.1–34.0)
MCHC: 32.9 g/dL (ref 31.5–36.0)
MCV: 75.7 fL — ABNORMAL LOW (ref 79.5–101.0)
MONO#: 0.5 10*3/uL (ref 0.1–0.9)
MONO%: 15.9 % — ABNORMAL HIGH (ref 0.0–14.0)
NEUT#: 0.7 10*3/uL — ABNORMAL LOW (ref 1.5–6.5)
NEUT%: 24.8 % — ABNORMAL LOW (ref 38.4–76.8)
Platelets: 176 10*3/uL (ref 145–400)
RBC: 4.49 10*6/uL (ref 3.70–5.45)
RDW: 16.3 % — ABNORMAL HIGH (ref 11.2–14.5)
WBC: 3 10*3/uL — ABNORMAL LOW (ref 3.9–10.3)
lymph#: 1.7 10*3/uL (ref 0.9–3.3)

## 2011-04-26 LAB — CHCC SMEAR

## 2011-04-28 LAB — I-STAT 8, (EC8 V) (CONVERTED LAB)
Acid-Base Excess: 2
BUN: 13
Bicarbonate: 27.5 — ABNORMAL HIGH
Chloride: 104
Glucose, Bld: 91
HCT: 45
Hemoglobin: 15.3 — ABNORMAL HIGH
Operator id: 268271
Potassium: 3.9
Sodium: 139
TCO2: 29
pCO2, Ven: 45.7
pH, Ven: 7.387 — ABNORMAL HIGH

## 2011-05-05 ENCOUNTER — Other Ambulatory Visit: Payer: Self-pay | Admitting: Oncology

## 2011-05-05 DIAGNOSIS — D649 Anemia, unspecified: Secondary | ICD-10-CM

## 2011-05-11 ENCOUNTER — Other Ambulatory Visit (HOSPITAL_COMMUNITY): Payer: Medicare Other

## 2011-05-13 ENCOUNTER — Ambulatory Visit: Payer: Medicare Other

## 2011-05-21 ENCOUNTER — Encounter: Payer: Self-pay | Admitting: Oncology

## 2011-05-21 ENCOUNTER — Other Ambulatory Visit: Payer: Self-pay | Admitting: Oncology

## 2011-05-21 DIAGNOSIS — E039 Hypothyroidism, unspecified: Secondary | ICD-10-CM | POA: Insufficient documentation

## 2011-05-21 DIAGNOSIS — E785 Hyperlipidemia, unspecified: Secondary | ICD-10-CM

## 2011-05-21 DIAGNOSIS — I10 Essential (primary) hypertension: Secondary | ICD-10-CM

## 2011-05-21 DIAGNOSIS — D72819 Decreased white blood cell count, unspecified: Secondary | ICD-10-CM | POA: Insufficient documentation

## 2011-05-21 DIAGNOSIS — I2510 Atherosclerotic heart disease of native coronary artery without angina pectoris: Secondary | ICD-10-CM

## 2011-05-21 DIAGNOSIS — K219 Gastro-esophageal reflux disease without esophagitis: Secondary | ICD-10-CM

## 2011-06-04 ENCOUNTER — Other Ambulatory Visit (HOSPITAL_BASED_OUTPATIENT_CLINIC_OR_DEPARTMENT_OTHER): Payer: Medicare Other

## 2011-06-04 ENCOUNTER — Other Ambulatory Visit: Payer: Self-pay | Admitting: Oncology

## 2011-06-04 ENCOUNTER — Telehealth: Payer: Self-pay | Admitting: Oncology

## 2011-06-04 ENCOUNTER — Ambulatory Visit (HOSPITAL_BASED_OUTPATIENT_CLINIC_OR_DEPARTMENT_OTHER): Payer: Medicare Other | Admitting: Oncology

## 2011-06-04 VITALS — BP 154/77 | HR 67 | Temp 98.1°F | Wt 138.3 lb

## 2011-06-04 DIAGNOSIS — D649 Anemia, unspecified: Secondary | ICD-10-CM

## 2011-06-04 DIAGNOSIS — D72819 Decreased white blood cell count, unspecified: Secondary | ICD-10-CM

## 2011-06-04 LAB — COMPREHENSIVE METABOLIC PANEL
ALT: 17 U/L (ref 0–35)
AST: 28 U/L (ref 0–37)
Albumin: 4 g/dL (ref 3.5–5.2)
Alkaline Phosphatase: 87 U/L (ref 39–117)
BUN: 9 mg/dL (ref 6–23)
CO2: 31 mEq/L (ref 19–32)
Calcium: 9.8 mg/dL (ref 8.4–10.5)
Chloride: 96 mEq/L (ref 96–112)
Creatinine, Ser: 0.71 mg/dL (ref 0.50–1.10)
Glucose, Bld: 129 mg/dL — ABNORMAL HIGH (ref 70–99)
Potassium: 3.8 mEq/L (ref 3.5–5.3)
Sodium: 132 mEq/L — ABNORMAL LOW (ref 135–145)
Total Bilirubin: 0.4 mg/dL (ref 0.3–1.2)
Total Protein: 6.6 g/dL (ref 6.0–8.3)

## 2011-06-04 LAB — CBC WITH DIFFERENTIAL/PLATELET
BASO%: 0.8 % (ref 0.0–2.0)
Basophils Absolute: 0 10*3/uL (ref 0.0–0.1)
EOS%: 3.1 % (ref 0.0–7.0)
Eosinophils Absolute: 0.1 10*3/uL (ref 0.0–0.5)
HCT: 33.9 % — ABNORMAL LOW (ref 34.8–46.6)
HGB: 11.2 g/dL — ABNORMAL LOW (ref 11.6–15.9)
LYMPH%: 54.7 % — ABNORMAL HIGH (ref 14.0–49.7)
MCH: 23.8 pg — ABNORMAL LOW (ref 25.1–34.0)
MCHC: 33 g/dL (ref 31.5–36.0)
MCV: 72 fL — ABNORMAL LOW (ref 79.5–101.0)
MONO#: 0.4 10*3/uL (ref 0.1–0.9)
MONO%: 11.6 % (ref 0.0–14.0)
NEUT#: 1.1 10*3/uL — ABNORMAL LOW (ref 1.5–6.5)
NEUT%: 29.8 % — ABNORMAL LOW (ref 38.4–76.8)
Platelets: 176 10*3/uL (ref 145–400)
RBC: 4.71 10*6/uL (ref 3.70–5.45)
RDW: 15.5 % — ABNORMAL HIGH (ref 11.2–14.5)
WBC: 3.5 10*3/uL — ABNORMAL LOW (ref 3.9–10.3)
lymph#: 1.9 10*3/uL (ref 0.9–3.3)
nRBC: 0 % (ref 0–0)

## 2011-06-04 LAB — MORPHOLOGY: PLT EST: ADEQUATE

## 2011-06-04 LAB — CHCC SMEAR

## 2011-06-04 NOTE — Telephone Encounter (Signed)
gve the pt's son the feb,may,aug,nov 2013 appt calendar.

## 2011-06-04 NOTE — Progress Notes (Signed)
[facility] OFFICE PROGRESS NOTE  Kari Baars, MD, MD [address] [facility], [place]. [place] [address]   DIAGNOSIS:  Leukocytopenia--neutropenia, anemia; NOS.  Last bone marrow biopsy in 2007 was hypercellular without MDS and with normal cytogenetics.   CURRENT THERAPY:  Active watchful observation.  INTERVAL HISTORY: Neal Dy 77 y.o. female returns for regular follow up with her son.  She has mild fatigue; however, she is still independent of activities of daily living.  She has heat/cold intolerance during day/night time, respectively.  She has pruritis of her skin without rash/erythema.  She has intermittent bilateral right and left upper abdominal pain, mild, lasting for minutes, not related to food/activity/bowel movement, spontaneously resolved.  She denies fever, headache, mucositis, nausea vomiting, abdominal swelling, nausea vomiting, hematochezia, hemoptysis, hematemesis, hematuria, melena, focal back pain, lower extremity weakness.   MEDICAL HISTORY: Past Medical History  Diagnosis Date  . Pneumonia   . Hypothyroidism   . Hypertension   . Hyperlipemia   . CAD (coronary artery disease)   . GERD (gastroesophageal reflux disease)   . Leukocytopenia     ALLERGIES:  is allergic to doxycycline; prednisone; and statins.  MEDICATIONS: Current Outpatient Prescriptions  Medication Sig Dispense Refill  . amLODipine (NORVASC) 5 MG tablet Take 5 mg by mouth daily.        Marland Kitchen aspirin 81 MG tablet Take 81 mg by mouth daily.        . Calcium Citrate (CITRACAL PO) Take by mouth 2 (two) times daily.        . Cholecalciferol (VITAMIN D-3 PO) Take by mouth. Taking Weekly       . CLONIDINE HCL PO Take by mouth. 0.1 mg daily        . fexofenadine (ALLEGRA) 60 MG tablet Take 60 mg by mouth daily as needed.        . hydrochlorothiazide 25 MG tablet Take 25 mg by mouth daily.        . Lactase (LACTAID PO) Take by mouth.        . levothyroxine  (SYNTHROID, LEVOTHROID) 150 MCG tablet Take 150 mcg by mouth daily.        . Multiple Vitamins-Minerals (CENTRUM SILVER PO) Take by mouth daily.        . nitroGLYCERIN (NITROSTAT) 0.4 MG SL tablet Place 0.4 mg under the tongue every 5 (five) minutes as needed.        Marland Kitchen POTASSIUM PO Take 20 mg by mouth.        . Probiotic Product (ALIGN PO) Take by mouth daily.        Marland Kitchen telmisartan (MICARDIS) 80 MG tablet Take 80 mg by mouth daily.        . TRAZODONE HCL PO Take 100 mg by mouth at bedtime.       . vitamin C (ASCORBIC ACID) 500 MG tablet Take 500 mg by mouth daily.          SURGICAL HISTORY:  Past Surgical History  Procedure Date  . US echocardiography 06-30-2005    Est EF 55-60%  . Cardiovascular stress test 08-15-2007    EF 59%  . Hysterotomy   . Appendectomy   . Cholecystectomy open   . Tonsillectomy     REVIEW OF SYSTEMS:  Pertinent items are noted in HPI.   Filed Vitals:   06/04/11 1447  BP: 154/77  Pulse: 67  Temp: 98.1 F (36.7 C)  ECOG 0.   Wt Readings from Last  3 Encounters:  06/04/11 138 lb 4.8 oz (62.732 kg)  04/01/11 140 lb 6.4 oz (63.685 kg)  08/08/08 183 lb (83.008 kg)    PHYSICAL EXAMINATION:   General:  Thin-appearing woman in no acute distress.  Eyes:  no scleral icterus.  ENT:  There were no oropharyngeal lesions.  Neck was without thyromegaly.  Lymphatics:  Negative cervical, supraclavicular or axillary adenopathy.  Respiratory: lungs were clear bilaterally without wheezing or crackles.  Cardiovascular:  Regular rate and rhythm, S1/S2, without murmur, rub or gallop.  There was no pedal edema.  GI:  abdomen was soft, flat, nontender, nondistended, without organomegaly.  Muscoloskeletal:  no spinal tenderness of palpation of vertebral spine.  Skin exam was without echymosis, petichae.  Neuro exam was nonfocal.  Patient needed minimal assistance to get on and off exam table.  Gait was normal.  Patient was alerted and oriented.  Attention was good.   Language was  appropriate.  Mood was normal without depression.  Speech was not pressured.  Thought content was not tangential.    LABORATORY/RADIOLOGY DATA:  Lab Results  Component Value Date   WBC 3.5* 06/04/2011   HGB 11.2* 06/04/2011   HCT 33.9* 06/04/2011   PLT 176 06/04/2011   GLUCOSE 77 03/29/2011   CHOL 203* 03/29/2011   TRIG 24.0 03/29/2011   HDL 79.50 03/29/2011   LDLDIRECT 109.5 03/29/2011   ALT 20 03/29/2011   AST 36 03/29/2011   NA 138 03/29/2011   K 3.8 03/29/2011   CL 101 03/29/2011   CREATININE 0.6 03/29/2011   BUN 13 03/29/2011   CO2 29 03/29/2011   INR 0.9 07/07/2007   I personally reviewed her peripheral blood smear today.  There was isocytosis.  There was no peripheral blast.  There was no schistocytosis, spherocytosis, target cell, rouleaux formation, tear drop cell.  There was no giant platelets or platelet clumps.    ASSESSMENT AND PLAN:  1. Hypertension:  Well controlled on amlodipine, clonidine, hydrochlorothiazide, telmisartan per PCP. 2. Hyperlipidemia:  She is on diet control only, as she had a problem with simvastatin in the past. 3. Hypothyroidism:  She is on levothyroxine.  She reported that her synthroid was recently increased.  Her PCP is checking her TSH.   4. Coronary artery disease:  She is on aspirin, ARB. 5. GERD, status post Nissen fundoplication in 2010. 6. History of diverticulosis; not active at this time.  7. Chronic intermittent leukocytopenia/neutropenia with mild normocytic anemia, without thrombocytopenia.  Her CBC earlier this year showed worsened neutropenia.  However, it has improved today.  I query whether her hypothyroidism is causing intermittent cytopenia.  She has not had recurrent infection or severe symptoms of anemia.  I recommended again watchful observation with CBC every 3 months here at the [facility].   I discussed with the patient and her son that I cannot definitively rule out MDS without bone marrow biopsy.  However, her CBC today is not  significantly different than her CBC on 2007 when she had a bone marrow biopsy that showed hypercellularity without dysplasia or abnormal cytogenetics.  Thus, in the future, if her ANC is persistently <1; Hgb <10; Plt <100, I may consider repeating bone marrow biopsy at that time.  Patient and her son expressed informed understanding and agreed with the stated plan.

## 2011-06-09 ENCOUNTER — Ambulatory Visit
Admission: RE | Admit: 2011-06-09 | Discharge: 2011-06-09 | Disposition: A | Discharge status: Home / Self Care | Payer: Medicare Other | Source: Ambulatory Visit | Attending: Internal Medicine | Admitting: Internal Medicine

## 2011-06-09 DIAGNOSIS — Z1231 Encounter for screening mammogram for malignant neoplasm of breast: Secondary | ICD-10-CM

## 2011-06-11 ENCOUNTER — Ambulatory Visit: Payer: Medicare Other

## 2011-06-30 ENCOUNTER — Other Ambulatory Visit: Payer: Medicare Other | Admitting: Lab

## 2011-06-30 ENCOUNTER — Ambulatory Visit: Payer: Medicare Other | Admitting: Oncology

## 2011-07-05 ENCOUNTER — Other Ambulatory Visit: Payer: Medicare Other | Admitting: Lab

## 2011-07-27 DIAGNOSIS — R7301 Impaired fasting glucose: Secondary | ICD-10-CM | POA: Diagnosis not present

## 2011-07-27 DIAGNOSIS — I10 Essential (primary) hypertension: Secondary | ICD-10-CM | POA: Diagnosis not present

## 2011-07-27 DIAGNOSIS — I2510 Atherosclerotic heart disease of native coronary artery without angina pectoris: Secondary | ICD-10-CM | POA: Diagnosis not present

## 2011-07-27 DIAGNOSIS — E039 Hypothyroidism, unspecified: Secondary | ICD-10-CM | POA: Diagnosis not present

## 2011-07-27 DIAGNOSIS — G589 Mononeuropathy, unspecified: Secondary | ICD-10-CM | POA: Diagnosis not present

## 2011-07-27 DIAGNOSIS — IMO0001 Reserved for inherently not codable concepts without codable children: Secondary | ICD-10-CM | POA: Diagnosis not present

## 2011-07-27 DIAGNOSIS — F411 Generalized anxiety disorder: Secondary | ICD-10-CM | POA: Diagnosis not present

## 2011-07-27 DIAGNOSIS — E785 Hyperlipidemia, unspecified: Secondary | ICD-10-CM | POA: Diagnosis not present

## 2011-08-17 DIAGNOSIS — D759 Disease of blood and blood-forming organs, unspecified: Secondary | ICD-10-CM | POA: Diagnosis not present

## 2011-08-26 DIAGNOSIS — Z124 Encounter for screening for malignant neoplasm of cervix: Secondary | ICD-10-CM | POA: Diagnosis not present

## 2011-08-30 ENCOUNTER — Other Ambulatory Visit: Payer: Medicare Other | Admitting: Lab

## 2011-09-08 DIAGNOSIS — D759 Disease of blood and blood-forming organs, unspecified: Secondary | ICD-10-CM | POA: Diagnosis not present

## 2011-09-08 DIAGNOSIS — F22 Delusional disorders: Secondary | ICD-10-CM | POA: Diagnosis not present

## 2011-09-11 ENCOUNTER — Encounter (HOSPITAL_COMMUNITY): Payer: Self-pay | Admitting: *Deleted

## 2011-09-11 ENCOUNTER — Ambulatory Visit (HOSPITAL_COMMUNITY)
Admission: RE | Admit: 2011-09-11 | Discharge: 2011-09-11 | Disposition: A | Discharge status: Home / Self Care | Payer: Medicare Other | Source: Ambulatory Visit | Attending: Psychiatry | Admitting: Psychiatry

## 2011-09-11 DIAGNOSIS — F39 Unspecified mood [affective] disorder: Secondary | ICD-10-CM | POA: Diagnosis not present

## 2011-09-11 DIAGNOSIS — H269 Unspecified cataract: Secondary | ICD-10-CM

## 2011-09-11 DIAGNOSIS — K229 Disease of esophagus, unspecified: Secondary | ICD-10-CM

## 2011-09-11 DIAGNOSIS — R160 Hepatomegaly, not elsewhere classified: Secondary | ICD-10-CM

## 2011-09-11 DIAGNOSIS — E039 Hypothyroidism, unspecified: Secondary | ICD-10-CM

## 2011-09-11 DIAGNOSIS — N2889 Other specified disorders of kidney and ureter: Secondary | ICD-10-CM

## 2011-09-11 DIAGNOSIS — S90562A Insect bite (nonvenomous), left ankle, initial encounter: Secondary | ICD-10-CM

## 2011-09-11 DIAGNOSIS — I219 Acute myocardial infarction, unspecified: Secondary | ICD-10-CM

## 2011-09-11 DIAGNOSIS — W57XXXA Bitten or stung by nonvenomous insect and other nonvenomous arthropods, initial encounter: Secondary | ICD-10-CM

## 2011-09-11 DIAGNOSIS — E7800 Pure hypercholesterolemia, unspecified: Secondary | ICD-10-CM

## 2011-09-11 DIAGNOSIS — R252 Cramp and spasm: Secondary | ICD-10-CM

## 2011-09-11 HISTORY — DX: Hypothyroidism, unspecified: E03.9

## 2011-09-11 HISTORY — DX: Pure hypercholesterolemia, unspecified: E78.00

## 2011-09-11 HISTORY — DX: Headache: R51

## 2011-09-11 HISTORY — DX: Other specified disorders of kidney and ureter: N28.89

## 2011-09-11 HISTORY — DX: Bitten or stung by nonvenomous insect and other nonvenomous arthropods, initial encounter: W57.XXXA

## 2011-09-11 HISTORY — PX: SHOULDER SURGERY: SHX246

## 2011-09-11 HISTORY — DX: Insect bite (nonvenomous), left ankle, initial encounter: S90.562A

## 2011-09-11 HISTORY — DX: Disease of esophagus, unspecified: K22.9

## 2011-09-11 HISTORY — DX: Unspecified cataract: H26.9

## 2011-09-11 HISTORY — DX: Hepatomegaly, not elsewhere classified: R16.0

## 2011-09-11 HISTORY — DX: Unspecified osteoarthritis, unspecified site: M19.90

## 2011-09-11 HISTORY — DX: Cramp and spasm: R25.2

## 2011-09-11 HISTORY — PX: KNEE SURGERY: SHX244

## 2011-09-11 HISTORY — PX: GALLBLADDER SURGERY: SHX652

## 2011-09-11 HISTORY — DX: Acute myocardial infarction, unspecified: I21.9

## 2011-09-12 NOTE — BH Assessment (Signed)
Assessment Note   [name] is an 78 y.o. single black female.  She is accompanied by her adult son, [name], who remained during assessment with verbal consent of patient.  She reports a past history of depression and anxiety, and was once treated as an outpatient at the [facility].  Recently her PCP referred her to [facility] for exacerbation of depression.  She has been seeing [name] for counseling for the past two weeks, and saw [name] for the first time for psychiatry on 09/08/2011.  The pt reports being started on an unspecified psychotropic (name starts with a "p," but pt does not remember it), and she reports having cramps in her left thigh at night ever since.  This has significantly impaired her sleep.  Pt denies SI, HI, AH/VH, or substance abuse.  She focuses on a move that she made to her current "Section 8" housing about 4 years ago.  She believes that that is when the current exacerbation of her depression began.  She reports that her apartment had mold problems, and that ever since she started pressing the staff at the apartment complex to address the problem, they have been persecuting her.  The principle figure in this, the pt alleges, is a maintenance man whom she believes makes incessant phone calls to her, manipulates her thermostat, manipulates the ambient temperature in the hall outside her apartment, and taunts her by calling her "sweetie."  She believes that he recently manipulated police into siding with him after she called them to report that he had blocked off a portion of hallway where he was working.  So concerned has she been about this man that she barricaded herself in her apartment on one occasion.  She reports that she has had thoughts of hitting the maintenance man with any unspecified object at hand, but she denies having any lethal weapons, adding that even her kitchen knives are dull.  In speaking to pt's son independently, he reports  that this maintenance man is mildly irresponsible, but that he creates no real problems for the pt.  He adds that the pt is making something out of nothing, and that if she lived in his apartment complex, he would probably evict her himself.  He reports, however, that this has been her baseline throughout his life, and that she always inflates the significance of perceived offenses against her by other people.  Axis I: Mood Disorder NOS 296.90 Axis II: Deferred 799.9 Axis III:  Past Medical History  Diagnosis Date  . Pneumonia   . Hypothyroidism   . Hypertension   . Hyperlipemia   . CAD (coronary artery disease)   . GERD (gastroesophageal reflux disease)   . Leukocytopenia   . Hypothyroid 09/11/2011  . Osteoporosis 09/11/2011  . Myocardial infarction 09/11/2011    Per pt in 2004  . Arthritis   . Liver mass 09/11/2011    Per report of pt  . Kidney mass 09/11/2011    Per report of pt  . Esophageal abnormality 09/11/2011    Surgically treated per pt.  . Hypercholesterolemia 09/11/2011  . Headache   . Cataracts, bilateral 09/11/2011    Per report of pt.  . Insect bite of ankle, left 09/11/2011    back of ankle  . Thigh cramp 09/11/2011    Left thigh x 3 nights since starting unspecified psychotropic medication   Axis IV: housing problems and problems with primary support group Axis V: 41-50 serious symptoms  Past  Medical History:  Past Medical History  Diagnosis Date  . Pneumonia   . Hypothyroidism   . Hypertension   . Hyperlipemia   . CAD (coronary artery disease)   . GERD (gastroesophageal reflux disease)   . Leukocytopenia   . Hypothyroid 09/11/2011  . Osteoporosis 09/11/2011  . Myocardial infarction 09/11/2011    Per pt in 2004  . Arthritis   . Liver mass 09/11/2011    Per report of pt  . Kidney mass 09/11/2011    Per report of pt  . Esophageal abnormality 09/11/2011    Surgically treated per pt.  . Hypercholesterolemia 09/11/2011  . Headache   . Cataracts, bilateral  09/11/2011    Per report of pt.  . Insect bite of ankle, left 09/11/2011    back of ankle  . Thigh cramp 09/11/2011    Left thigh x 3 nights since starting unspecified psychotropic medication    Past Surgical History  Procedure Date  . US echocardiography 06-30-2005    Est EF 55-60%  . Cardiovascular stress test 08-15-2007    EF 59%  . Hysterotomy   . Appendectomy   . Cholecystectomy open   . Tonsillectomy   . Knee surgery 09/11/2011  . Gallbladder surgery 09/11/2011  . Abdominal hysterectomy   . Shoulder surgery 09/11/2011    Right side    Family History:  Family History  Problem Relation Age of Onset  . Hypertension Sister   . Hypertension Brother     Social History:  reports that she has never smoked. She does not have any smokeless tobacco history on file. She reports that she does not drink alcohol or use illicit drugs.  Additional Social History:  Alcohol / Drug Use Pain Medications: Denies Prescriptions: Denies Over the Counter: Denies History of alcohol / drug use?: No history of alcohol / drug abuse Longest period of sobriety (when/how long): Not applicable Allergies:  Allergies  Allergen Reactions  . Doxycycline     REACTION: itching/rash  . Prednisone     REACTION: itching  . Seasonal   . Statins     Home Medications:  Medications Prior to Admission  Medication Sig Dispense Refill  . amLODipine (NORVASC) 5 MG tablet Take 5 mg by mouth daily.        Marland Kitchen aspirin 81 MG tablet Take 81 mg by mouth daily.        . Calcium Citrate (CITRACAL PO) Take by mouth 2 (two) times daily.        . Cholecalciferol (VITAMIN D-3 PO) Take by mouth. Taking Weekly       . CLONIDINE HCL PO Take by mouth. 0.1 mg daily        . fexofenadine (ALLEGRA) 60 MG tablet Take 60 mg by mouth daily as needed.        . hydrochlorothiazide 25 MG tablet Take 25 mg by mouth daily.        . Lactase (LACTAID PO) Take by mouth.        . levothyroxine (SYNTHROID, LEVOTHROID) 150 MCG tablet Take  150 mcg by mouth daily.        . Multiple Vitamins-Minerals (CENTRUM SILVER PO) Take by mouth daily.        . nitroGLYCERIN (NITROSTAT) 0.4 MG SL tablet Place 0.4 mg under the tongue every 5 (five) minutes as needed.        Marland Kitchen POTASSIUM PO Take 20 mg by mouth.        . Probiotic Product (ALIGN PO)  Take by mouth daily.        Marland Kitchen telmisartan (MICARDIS) 80 MG tablet Take 80 mg by mouth daily.        . TRAZODONE HCL PO Take 100 mg by mouth at bedtime.       . vitamin C (ASCORBIC ACID) 500 MG tablet Take 500 mg by mouth daily.         No current facility-administered medications on file as of 09/11/2011.    OB/GYN Status:  No LMP recorded.  General Assessment Data Location of Assessment: [facility] Assessment Services Living Arrangements: Alone (Section 8 housing per pt.) Can pt return to current living arrangement?: Yes Admission Status: Voluntary Is patient capable of signing voluntary admission?: Yes Transfer from: Home Referral Source: Self/Family/Friend  Education Status Is patient currently in school?: No  Risk to self Suicidal Ideation: No Suicidal Intent: No Is patient at risk for suicide?: No Suicidal Plan?: No Access to Means: No What has been your use of drugs/alcohol within the last 12 months?: Denies Previous Attempts/Gestures: No How many times?: 0  Other Self Harm Risks: Pt denies any. Triggers for Past Attempts: Other (Comment) (Not applicable) Intentional Self Injurious Behavior: None Family Suicide History: No (Niece has substance abuse & possible psychiatric problems) Recent stressful life event(s): Conflict (Comment) (With apartment staff) Persecutory voices/beliefs?: Yes (Believes apartment staff are persecuting her.) Depression: Yes Depression Symptoms: Insomnia;Tearfulness;Isolating;Fatigue;Loss of interest in usual pleasures;Feeling worthless/self pity (Reports feeling hopeless, useless) Substance abuse history and/or treatment for substance abuse?: No Suicide  prevention information given to non-admitted patients: Yes  Risk to Others Homicidal Ideation: No Thoughts of Harm to Others: No Current Homicidal Intent: No Current Homicidal Plan: No Access to Homicidal Means: No Identified Victim: None History of harm to others?: No Assessment of Violence: None Noted Violent Behavior Description: Calm/cooperative; reports thoughts of hitting apartment maintenance man with improvised weapons. Does patient have access to weapons?: Yes (Comment) (+Kitchen knives; denies having guns.) Criminal Charges Pending?: No Does patient have a court date: No  Psychosis Hallucinations: None noted Delusions: Persecutory (R/O:Thinks apartment staff plot against her;This is baseline)  Mental Status Report Appear/Hygiene: Other (Comment) (Neat, well groomed) Eye Contact: Good Motor Activity: Unremarkable Speech: Other (Comment) (Unremarkable) Level of Consciousness: Alert Mood: Other (Comment) (Pleasant) Affect: Appropriate to circumstance Anxiety Level: None Thought Processes: Coherent;Circumstantial Judgement: Impaired Orientation: Person;Place;Time;Situation Obsessive Compulsive Thoughts/Behaviors: Minimal (Will not accept help with cleaning dishes, kitchen)  Cognitive Functioning Concentration: Decreased Memory: Recent Intact;Remote Intact IQ: Average Insight: Poor Impulse Control: Good Appetite: Fair Weight Loss: 20  (Over 2 yr period) Weight Gain: 0  Sleep: Decreased Total Hours of Sleep: 2  (over past 2 nights; ongoing sleep problems) Vegetative Symptoms: Staying in bed  Prior Inpatient Therapy Prior Inpatient Therapy: Yes Prior Therapy Dates: 78 or 1982 @ [facility] for depression  Prior Outpatient Therapy Prior Outpatient Therapy: Yes Prior Therapy Dates: Years ago @ [facility] for depression Prior Therapy Facilty/Provider(s): Past 2 weeks - [employer] @ [facility] for counseling Reason for Treatment: 1st visit  on 09/08/11 with [name] @ [facility] for psychiatry  ADL Screening (condition at time of admission) Patient's cognitive ability adequate to safely complete daily activities?: Yes Patient able to express need for assistance with ADLs?: Yes Independently performs ADLs?: Yes (Ambulates w/ pain; recent balance problems; no falls.) Walks in Home: Independent with device (comment) Weakness of Legs: None Weakness of Arms/Hands: None  Home Assistive Devices/Equipment Home Assistive Devices/Equipment: Cane    Abuse/Neglect Assessment (  Assessment to be complete while patient is alone) Physical Abuse: Denies Verbal Abuse: Denies Sexual Abuse: Denies Exploitation of patient/patient's resources: Denies Self-Neglect: Denies     Merchant navy officer (For Healthcare) Advance Directive: Patient does not have advance directive;Patient would like information Patient requests advance directive information: Advance directive packet given Pre-existing out of facility DNR order (yellow form or pink MOST form): No Nutrition Screen Diet: Low cholesterol  Additional Information 1:1 In Past 12 Months?: No CIRT Risk: No Elopement Risk: No Does patient have medical clearance?: No     Disposition:  Disposition Disposition of Patient: Referred to Patient referred to:  (Current providers @ [facility]) Discussed pt with [name], who believes that pt does not meet criteria for hospitalization.  Pt was advised to follow up with her current outpatient psychiatrist and therapist.  On Site Evaluation by:   Reviewed with Physician:  [name] Spurr, PA @ 18:45   Raphael Gibney 09/12/2011 9:22 AM

## 2011-09-16 DIAGNOSIS — D759 Disease of blood and blood-forming organs, unspecified: Secondary | ICD-10-CM | POA: Diagnosis not present

## 2011-10-13 ENCOUNTER — Ambulatory Visit: Payer: Medicare Other | Admitting: Cardiovascular Disease

## 2011-10-13 DIAGNOSIS — D759 Disease of blood and blood-forming organs, unspecified: Secondary | ICD-10-CM | POA: Diagnosis not present

## 2011-10-19 ENCOUNTER — Encounter: Payer: Self-pay | Admitting: Cardiovascular Disease

## 2011-10-19 ENCOUNTER — Ambulatory Visit (INDEPENDENT_AMBULATORY_CARE_PROVIDER_SITE_OTHER): Payer: Medicare Other | Admitting: Cardiovascular Disease

## 2011-10-19 VITALS — BP 156/83 | HR 59 | Ht 63.0 in | Wt 132.8 lb

## 2011-10-19 DIAGNOSIS — I10 Essential (primary) hypertension: Secondary | ICD-10-CM | POA: Diagnosis not present

## 2011-10-19 DIAGNOSIS — I2510 Atherosclerotic heart disease of native coronary artery without angina pectoris: Secondary | ICD-10-CM | POA: Diagnosis not present

## 2011-10-19 MED ORDER — THIOTHIXENE 2 MG PO CAPS: ORAL_CAPSULE | ORAL | Status: DC

## 2011-10-19 MED ORDER — AMLODIPINE BESYLATE 10 MG PO TABS: 10.0000 mg | ORAL_TABLET | Freq: Every day | ORAL | Status: DC

## 2011-10-19 NOTE — Patient Instructions (Signed)
Your physician recommends that you schedule a follow-up appointment in:3 MONTHS   Your physician has recommended you make the following change in your medication:   INCREASE AMLODIPINE FROM 5MG  TO 10 MG DAILY, DOUBLE UP ON THE 5MG  TABLETS AT HOME TILL GONE THEN PICK UP NEW SCRIPT STRENGTH

## 2011-10-19 NOTE — Progress Notes (Signed)
[name] Date of Birth  1932-06-07 [facility] [address]     [address] [place], [place]  [address] [phone]  Fax  [phone]  Problems: 1. Minimal coronary artery disease 2. Hyperlipidemia 3. Chronic cough 4. Hypothyroidism 5. Status post Nissen fundoplication for acid reflux 6. Hypertension  History of Present Illness:  Connor is an elderly female with a history of chest pain with minimal coronary artery disease. She has a history of hyperlipidemia chronic cough she status post Nissen fundoplication for acid reflux. She has a history of hypertension.  She's continued to have some episodes of chest discomfort. These are fairly atypical. She has not been sleeping well at night.  She has bilateral arm pain  Current Outpatient Prescriptions on File Prior to Visit  Medication Sig Dispense Refill  . acetaminophen (TYLENOL) 500 MG tablet Take 500 mg by mouth as needed.      Marland Kitchen amLODipine (NORVASC) 5 MG tablet Take 5 mg by mouth daily.        Marland Kitchen aspirin 81 MG tablet Take 81 mg by mouth daily.        . Cholecalciferol (VITAMIN D-3 PO) Take by mouth. Taking 4000 Units Daily      . CLONIDINE HCL PO Take by mouth. 0.1 mg daily        . hydrochlorothiazide 25 MG tablet Take 25 mg by mouth daily.        Marland Kitchen levothyroxine (SYNTHROID, LEVOTHROID) 150 MCG tablet Take 125 mcg by mouth daily.       . Multiple Vitamins-Minerals (CENTRUM SILVER PO) Take by mouth daily.        . nitroGLYCERIN (NITROSTAT) 0.4 MG SL tablet Place 0.4 mg under the tongue every 5 (five) minutes as needed.        . polyethylene glycol (MIRALAX / GLYCOLAX) packet Take 17 g by mouth as needed. Pt's medication list states that pt is to take 3350, but does not specify units.      Marland Kitchen POTASSIUM PO Take 20 mg by mouth.        . Probiotic Product (ALIGN PO) Take by mouth daily.        Marland Kitchen telmisartan (MICARDIS) 80 MG tablet Take 80 mg by mouth daily.        . TRAZODONE HCL  PO Take 100 mg by mouth at bedtime.       . vitamin C (ASCORBIC ACID) 500 MG tablet Take 1,000 mg by mouth daily.         Allergies  Allergen Reactions  . Doxycycline     REACTION: itching/rash  . Prednisone     REACTION: itching  . Seasonal   . Statins     Past Medical History  Diagnosis Date  . Pneumonia   . Hypothyroidism   . Hypertension   . Hyperlipemia   . CAD (coronary artery disease)   . GERD (gastroesophageal reflux disease)   . Leukocytopenia   . Hypothyroid 09/11/2011  . Osteoporosis 09/11/2011  . Myocardial infarction 09/11/2011    Per pt in 2004  . Arthritis   . Liver mass 09/11/2011    Per report of pt  . Kidney mass 09/11/2011    Per report of pt  . Esophageal abnormality 09/11/2011    Surgically treated per pt.  . Hypercholesterolemia 09/11/2011  . Headache   . Cataracts, bilateral 09/11/2011    Per report of pt.  . Insect bite of ankle, left 09/11/2011  back of ankle  . Thigh cramp 09/11/2011    Left thigh x 3 nights since starting unspecified psychotropic medication    Past Surgical History  Procedure Date  . US echocardiography 06-30-2005    Est EF 55-60%  . Cardiovascular stress test 08-15-2007    EF 59%  . Hysterotomy   . Appendectomy   . Cholecystectomy open   . Tonsillectomy   . Knee surgery 09/11/2011  . Gallbladder surgery 09/11/2011  . Abdominal hysterectomy   . Shoulder surgery 09/11/2011    Right side    History  Smoking status  . Never Smoker   Smokeless tobacco  . Not on file    History  Alcohol Use No    Family History  Problem Relation Age of Onset  . Hypertension Sister   . Hypertension Brother     Reviw of Systems:  Reviewed in the HPI.  All other systems are negative.  Physical Exam: BP 156/83  Pulse 59  Ht 5\' 3"  (1.6 m)  Wt 132 lb 12.8 oz (60.238 kg)  BMI 23.52 kg/m2 The patient is alert and oriented x 3.  The mood and affect are normal.   Skin: warm and dry.  Color is normal.    HEENT:   the sclera are  nonicteric.  The mucous membranes are moist.  The carotids are 2+ without bruits.  There is no thyromegaly.  There is no JVD.    Lungs: clear.  The chest wall is non tender.    Heart: regular rate with a normal S1 and S2.  There are no murmurs, gallops, or rubs. The PMI is not displaced.     Abdomen: good bowel sounds.  There is no guarding or rebound.  There is no hepatosplenomegaly or tenderness.  There are no masses.   Extremities:  no clubbing, cyanosis, or edema.  The legs are without rashes.  The distal pulses are intact.   Neuro:  Cranial nerves II - XII are intact.  Motor and sensory functions are intact.    The gait is normal.  EKG: 10/19/2011. Normal sinus rhythm at 60 beats a minute. Normal EKG.  Assessment / Plan:

## 2011-10-19 NOTE — Assessment & Plan Note (Signed)
Dailey  Has had a very tiny apical infarction caused by a severe stenosis of her terminal posterior descending artery. Or other coronary arteries were perfectly normal. I do not think that any of her symptoms now are related to angina.

## 2011-10-19 NOTE — Assessment & Plan Note (Signed)
We will increase her amlodipine to 10 mg a day. We'll see her again in 3 months for an office visit.

## 2011-10-22 ENCOUNTER — Telehealth: Payer: Self-pay | Admitting: Cardiovascular Disease

## 2011-10-22 NOTE — Telephone Encounter (Signed)
Will fax note to company   To Whom it May Concern:  [name] was here for an appointment on 10/19/11 with Dr Melburn Popper.  If any questions please call [phone].  Thank You,  Anselm Pancoast

## 2011-10-22 NOTE — Telephone Encounter (Signed)
New msg: pt calling wanting to see if we can fax transportation company and let them know that pt came to appt on Tuesday 10/19/11.   Fax # to [employer]:  [phone]

## 2011-10-27 DIAGNOSIS — F0630 Mood disorder due to known physiological condition, unspecified: Secondary | ICD-10-CM | POA: Diagnosis not present

## 2011-11-17 DIAGNOSIS — D759 Disease of blood and blood-forming organs, unspecified: Secondary | ICD-10-CM | POA: Diagnosis not present

## 2011-11-25 DIAGNOSIS — I10 Essential (primary) hypertension: Secondary | ICD-10-CM | POA: Diagnosis not present

## 2011-11-25 DIAGNOSIS — E039 Hypothyroidism, unspecified: Secondary | ICD-10-CM | POA: Diagnosis not present

## 2011-11-25 DIAGNOSIS — R7301 Impaired fasting glucose: Secondary | ICD-10-CM | POA: Diagnosis not present

## 2011-11-25 DIAGNOSIS — E785 Hyperlipidemia, unspecified: Secondary | ICD-10-CM | POA: Diagnosis not present

## 2011-12-03 ENCOUNTER — Telehealth: Payer: Self-pay | Admitting: Oncology

## 2011-12-03 NOTE — Telephone Encounter (Signed)
pt called to r/s her 5/20 lab to 5/24,done aware  aom

## 2011-12-06 ENCOUNTER — Other Ambulatory Visit: Payer: Medicare Other | Admitting: Lab

## 2011-12-10 ENCOUNTER — Telehealth: Payer: Self-pay | Admitting: *Deleted

## 2011-12-10 ENCOUNTER — Other Ambulatory Visit (HOSPITAL_BASED_OUTPATIENT_CLINIC_OR_DEPARTMENT_OTHER): Payer: Medicare Other

## 2011-12-10 DIAGNOSIS — D72819 Decreased white blood cell count, unspecified: Secondary | ICD-10-CM | POA: Diagnosis not present

## 2011-12-10 LAB — CBC WITH DIFFERENTIAL/PLATELET
BASO%: 0.7 % (ref 0.0–2.0)
Basophils Absolute: 0 10*3/uL (ref 0.0–0.1)
EOS%: 2.4 % (ref 0.0–7.0)
Eosinophils Absolute: 0.1 10*3/uL (ref 0.0–0.5)
HCT: 33.6 % — ABNORMAL LOW (ref 34.8–46.6)
HGB: 10.8 g/dL — ABNORMAL LOW (ref 11.6–15.9)
LYMPH%: 39.7 % (ref 14.0–49.7)
MCH: 24.8 pg — ABNORMAL LOW (ref 25.1–34.0)
MCHC: 32.2 g/dL (ref 31.5–36.0)
MCV: 77 fL — ABNORMAL LOW (ref 79.5–101.0)
MONO#: 0.6 10*3/uL (ref 0.1–0.9)
MONO%: 18.3 % — ABNORMAL HIGH (ref 0.0–14.0)
NEUT#: 1.2 10*3/uL — ABNORMAL LOW (ref 1.5–6.5)
NEUT%: 38.9 % (ref 38.4–76.8)
Platelets: 166 10*3/uL (ref 145–400)
RBC: 4.36 10*6/uL (ref 3.70–5.45)
RDW: 14.7 % — ABNORMAL HIGH (ref 11.2–14.5)
WBC: 3 10*3/uL — ABNORMAL LOW (ref 3.9–10.3)
lymph#: 1.2 10*3/uL (ref 0.9–3.3)
nRBC: 0 % (ref 0–0)

## 2011-12-10 NOTE — Telephone Encounter (Signed)
Message copied by Wende Mott on Fri Dec 10, 2011  2:19 PM ------      Message from: Jethro Bolus T      Created: Fri Dec 10, 2011  1:45 PM       Please call pt or her son. She still has mild anemia and leukopenia.  However, they are stable over the years.  I recommend continue watchful observation.  Thanks

## 2011-12-10 NOTE — Telephone Encounter (Signed)
Spoke w/ pt's son and informed him of lab results stable per Dr. Gaylyn Rong and to continue observation.  Keep next lab appt in August as scheduled.  Son verbalized understanding.

## 2012-01-13 ENCOUNTER — Encounter: Payer: Self-pay | Admitting: Cardiovascular Disease

## 2012-01-13 ENCOUNTER — Ambulatory Visit (INDEPENDENT_AMBULATORY_CARE_PROVIDER_SITE_OTHER): Payer: Medicare Other | Admitting: Cardiovascular Disease

## 2012-01-13 VITALS — BP 120/76 | HR 76 | Ht 63.0 in | Wt 139.8 lb

## 2012-01-13 DIAGNOSIS — IMO0001 Reserved for inherently not codable concepts without codable children: Secondary | ICD-10-CM

## 2012-01-13 DIAGNOSIS — T148XXA Other injury of unspecified body region, initial encounter: Secondary | ICD-10-CM | POA: Diagnosis not present

## 2012-01-13 DIAGNOSIS — E785 Hyperlipidemia, unspecified: Secondary | ICD-10-CM | POA: Diagnosis not present

## 2012-01-13 DIAGNOSIS — M791 Myalgia, unspecified site: Secondary | ICD-10-CM

## 2012-01-13 LAB — CBC WITH DIFFERENTIAL/PLATELET
Basophils Absolute: 0 10*3/uL (ref 0.0–0.1)
Basophils Relative: 0.8 % (ref 0.0–3.0)
Eosinophils Absolute: 0 10*3/uL (ref 0.0–0.7)
Eosinophils Relative: 1.6 % (ref 0.0–5.0)
HCT: 36.8 % (ref 36.0–46.0)
Hemoglobin: 11.6 g/dL — ABNORMAL LOW (ref 12.0–15.0)
Lymphocytes Relative: 41.7 % (ref 12.0–46.0)
Lymphs Abs: 1 10*3/uL (ref 0.7–4.0)
MCHC: 31.5 g/dL (ref 30.0–36.0)
MCV: 78.3 fl (ref 78.0–100.0)
Monocytes Absolute: 0.4 10*3/uL (ref 0.1–1.0)
Monocytes Relative: 16.4 % — ABNORMAL HIGH (ref 3.0–12.0)
Neutro Abs: 1 10*3/uL — ABNORMAL LOW (ref 1.4–7.7)
Neutrophils Relative %: 39.5 % — ABNORMAL LOW (ref 43.0–77.0)
Platelets: 156 10*3/uL (ref 150.0–400.0)
RBC: 4.7 Mil/uL (ref 3.87–5.11)
RDW: 14.7 % — ABNORMAL HIGH (ref 11.5–14.6)
WBC: 2.4 10*3/uL — ABNORMAL LOW (ref 4.5–10.5)

## 2012-01-13 LAB — BASIC METABOLIC PANEL
BUN: 9 mg/dL (ref 6–23)
CO2: 31 mEq/L (ref 19–32)
Calcium: 9.6 mg/dL (ref 8.4–10.5)
Chloride: 98 mEq/L (ref 96–112)
Creatinine, Ser: 0.7 mg/dL (ref 0.4–1.2)
GFR: 99.09 mL/min (ref >60.00)
Glucose, Bld: 51 mg/dL — ABNORMAL LOW (ref 70–99)
Potassium: 3.9 mEq/L (ref 3.5–5.1)
Sodium: 134 mEq/L — ABNORMAL LOW (ref 135–145)

## 2012-01-13 LAB — HEPATIC FUNCTION PANEL
ALT: 16 U/L (ref 0–35)
AST: 28 U/L (ref 0–37)
Albumin: 3.9 g/dL (ref 3.5–5.2)
Alkaline Phosphatase: 71 U/L (ref 39–117)
Bilirubin, Direct: 0 mg/dL (ref 0.0–0.3)
Total Bilirubin: 0.5 mg/dL (ref 0.3–1.2)
Total Protein: 6.8 g/dL (ref 6.0–8.3)

## 2012-01-13 NOTE — Progress Notes (Signed)
[name] Date of Birth  Feb 19, 1932 [facility] [address]     [address] [place], [place]  [address] [phone]  Fax  [phone]  Problems: 1. Minimal coronary artery disease 2. Hyperlipidemia 3. Chronic cough 4. Hypothyroidism 5. Status post Nissen fundoplication for acid reflux 6. Hypertension  History of Present Illness:  [name] is an elderly female with a history of chest pain with minimal coronary artery disease. She has a history of hyperlipidemia chronic cough she status post Nissen fundoplication for acid reflux. She has a history of hypertension.  She's continued to have some episodes of chest discomfort. These are fairly atypical.  She has had lots of bruising recently. She previously had lots of bruising when she was on Plavix but she's not been on Plavix for a year. She also notices lots of diffuse muscle aches. She's not on a statin.  Current Outpatient Prescriptions on File Prior to Visit  Medication Sig Dispense Refill  . acetaminophen (TYLENOL) 500 MG tablet Take 500 mg by mouth as needed.      Marland Kitchen amLODipine (NORVASC) 10 MG tablet Take 1 tablet (10 mg total) by mouth daily.  30 tablet  5  . aspirin 81 MG tablet Take 81 mg by mouth daily.        . Calcium Carbonate-Vitamin D (CALCIUM + D PO) Take by mouth daily.      . Cholecalciferol (VITAMIN D-3 PO) Take 1,000 mg by mouth daily.       Marland Kitchen CLONIDINE HCL PO Take by mouth. 0.1 mg daily        . docusate sodium (COLACE) 100 MG capsule Take 100 mg by mouth daily.      . hydrochlorothiazide 25 MG tablet Take 25 mg by mouth daily.        Marland Kitchen levothyroxine (SYNTHROID, LEVOTHROID) 150 MCG tablet Take 125 mcg by mouth daily.       . Loratadine (CLARITIN PO) Take 5 mg by mouth daily.      . Multiple Vitamins-Minerals (CENTRUM SILVER PO) Take by mouth daily.        . nitroGLYCERIN (NITROSTAT) 0.4 MG SL tablet Place 0.4 mg under the tongue every 5 (five) minutes as  needed.        . Potassium Chloride (KLOR-CON PO) Take 20 mEq by mouth daily.      Marland Kitchen POTASSIUM PO Take 20 mg by mouth.        . Probiotic Product (ALIGN PO) Take by mouth daily.        Marland Kitchen telmisartan (MICARDIS) 80 MG tablet Take 80 mg by mouth daily.        Marland Kitchen thiothixene (NAVANE) 2 MG capsule TAKE TWO TABLETS AT BED TIME      . TRAZODONE HCL PO Take 100 mg by mouth at bedtime.         Allergies  Allergen Reactions  . Cholestatin   . Doxycycline     REACTION: itching/rash  . Prednisone     REACTION: itching  . Statins     Past Medical History  Diagnosis Date  . Pneumonia   . Hypothyroidism   . Hypertension   . Hyperlipemia   . CAD (coronary artery disease)   . GERD (gastroesophageal reflux disease)   . Leukocytopenia   . Hypothyroid 09/11/2011  . Osteoporosis 09/11/2011  . Myocardial infarction 09/11/2011    Per pt in 2004  . Arthritis   . Liver mass 09/11/2011  Per report of pt  . Kidney mass 09/11/2011    Per report of pt  . Esophageal abnormality 09/11/2011    Surgically treated per pt.  . Hypercholesterolemia 09/11/2011  . Headache   . Cataracts, bilateral 09/11/2011    Per report of pt.  . Insect bite of ankle, left 09/11/2011    back of ankle  . Thigh cramp 09/11/2011    Left thigh x 3 nights since starting unspecified psychotropic medication    Past Surgical History  Procedure Date  . US echocardiography 06-30-2005    Est EF 55-60%  . Cardiovascular stress test 08-15-2007    EF 59%  . Hysterotomy   . Appendectomy   . Cholecystectomy open   . Tonsillectomy   . Knee surgery 09/11/2011  . Gallbladder surgery 09/11/2011  . Abdominal hysterectomy   . Shoulder surgery 09/11/2011    Right side    History  Smoking status  . Never Smoker   Smokeless tobacco  . Not on file    History  Alcohol Use No    Family History  Problem Relation Age of Onset  . Hypertension Sister   . Hypertension Brother     Reviw of Systems:  Reviewed in the HPI.  All other  systems are negative.  Physical Exam: BP 120/76  Pulse 76  Ht 5\' 3"  (1.6 m)  Wt 139 lb 12.8 oz (63.413 kg)  BMI 24.76 kg/m2 The patient is alert and oriented x 3.  The mood and affect are normal.   Skin: warm and dry.  Color is normal.    HEENT:   the sclera are nonicteric.  The mucous membranes are moist.  The carotids are 2+ without bruits.  There is no thyromegaly.  There is no JVD.    Lungs: clear.  The chest wall is non tender.    Heart: regular rate with a normal S1 and S2.  There are no murmurs, gallops, or rubs. The PMI is not displaced.     Abdomen: good bowel sounds.  There is no guarding or rebound.  There is no hepatosplenomegaly or tenderness.  There are no masses.   Extremities:  no clubbing, cyanosis, or edema.  The legs are without rashes.  The distal pulses are intact.   Neuro:  Cranial nerves II - XII are intact.  Motor and sensory functions are intact.    The gait is normal.  EKG: 10/19/2011. Normal sinus rhythm at 60 beats a minute. Normal EKG.  Assessment / Plan:

## 2012-01-13 NOTE — Assessment & Plan Note (Signed)
We will check lipids in 6 months.

## 2012-01-13 NOTE — Patient Instructions (Addendum)
Your physician recommends that you return for lab work in: today/ cbc, bmet, hfp, ck-ckmb  Your physician recommends that you return for a FASTING lipid profile: 6 month   Your physician wants you to follow-up in: 6 months  You will receive a reminder letter in the mail two months in advance. If you don't receive a letter, please call our office to schedule the follow-up appointment.

## 2012-01-13 NOTE — Assessment & Plan Note (Signed)
She is doing well.

## 2012-01-13 NOTE — Assessment & Plan Note (Signed)
She has diffuse muscle aches.  She is not on a statin.  Will check a total CK.

## 2012-01-13 NOTE — Assessment & Plan Note (Signed)
Mrs. Holderman has lots of bruising. She previously had lots of bruising when she was on Plavix which he stop the Plavix and the  bruising went away. It now has returned. We'll check a CBC today to make sure that her platelet count was okay. She has had a myocardial infarction many years ago. He had minor coronary artery irregularities. She'll continue taking a baby aspirin every day.

## 2012-01-14 LAB — CK TOTAL AND CKMB (NOT AT ARMC)
CK, MB: 3.7 ng/mL (ref 0.3–4.0)
Relative Index: 2.2 (ref 0.0–2.5)
Total CK: 165 U/L (ref 7–177)

## 2012-01-27 DIAGNOSIS — D759 Disease of blood and blood-forming organs, unspecified: Secondary | ICD-10-CM | POA: Diagnosis not present

## 2012-02-01 DIAGNOSIS — F0630 Mood disorder due to known physiological condition, unspecified: Secondary | ICD-10-CM | POA: Diagnosis not present

## 2012-03-07 DIAGNOSIS — D759 Disease of blood and blood-forming organs, unspecified: Secondary | ICD-10-CM | POA: Diagnosis not present

## 2012-03-13 ENCOUNTER — Other Ambulatory Visit (HOSPITAL_BASED_OUTPATIENT_CLINIC_OR_DEPARTMENT_OTHER): Payer: Medicare Other

## 2012-03-13 DIAGNOSIS — D72819 Decreased white blood cell count, unspecified: Secondary | ICD-10-CM

## 2012-03-13 LAB — CBC WITH DIFFERENTIAL/PLATELET
BASO%: 0.6 % (ref 0.0–2.0)
Basophils Absolute: 0 10*3/uL (ref 0.0–0.1)
EOS%: 1.4 % (ref 0.0–7.0)
Eosinophils Absolute: 0 10*3/uL (ref 0.0–0.5)
HCT: 34.4 % — ABNORMAL LOW (ref 34.8–46.6)
HGB: 11 g/dL — ABNORMAL LOW (ref 11.6–15.9)
LYMPH%: 41.4 % (ref 14.0–49.7)
MCH: 24.1 pg — ABNORMAL LOW (ref 25.1–34.0)
MCHC: 32 g/dL (ref 31.5–36.0)
MCV: 75.3 fL — ABNORMAL LOW (ref 79.5–101.0)
MONO#: 0.5 10*3/uL (ref 0.1–0.9)
MONO%: 14.8 % — ABNORMAL HIGH (ref 0.0–14.0)
NEUT#: 1.3 10*3/uL — ABNORMAL LOW (ref 1.5–6.5)
NEUT%: 41.8 % (ref 38.4–76.8)
Platelets: 168 10*3/uL (ref 145–400)
RBC: 4.56 10*6/uL (ref 3.70–5.45)
RDW: 14.4 % (ref 11.2–14.5)
WBC: 3 10*3/uL — ABNORMAL LOW (ref 3.9–10.3)
lymph#: 1.3 10*3/uL (ref 0.9–3.3)

## 2012-03-14 ENCOUNTER — Telehealth: Payer: Self-pay | Admitting: *Deleted

## 2012-03-14 NOTE — Telephone Encounter (Signed)
Message copied by Wende Mott on Tue Mar 14, 2012  1:17 PM ------      Message from: HA, Raliegh Ip T      Created: Mon Mar 13, 2012  4:12 PM       Please call pt.  her anemia and liver cytopenia has improved. I recommend continued observation. Thanks

## 2012-03-14 NOTE — Telephone Encounter (Signed)
Left VM for pt's son informing of anemia/labs improved and for pt to keep next visit in November as scheduled.  Asked him to call back if any questions/concerns.

## 2012-04-05 DIAGNOSIS — F0630 Mood disorder due to known physiological condition, unspecified: Secondary | ICD-10-CM | POA: Diagnosis not present

## 2012-04-05 DIAGNOSIS — F22 Delusional disorders: Secondary | ICD-10-CM | POA: Diagnosis not present

## 2012-04-11 DIAGNOSIS — D759 Disease of blood and blood-forming organs, unspecified: Secondary | ICD-10-CM | POA: Diagnosis not present

## 2012-04-13 DIAGNOSIS — E785 Hyperlipidemia, unspecified: Secondary | ICD-10-CM | POA: Diagnosis not present

## 2012-04-13 DIAGNOSIS — I10 Essential (primary) hypertension: Secondary | ICD-10-CM | POA: Diagnosis not present

## 2012-04-13 DIAGNOSIS — E039 Hypothyroidism, unspecified: Secondary | ICD-10-CM | POA: Diagnosis not present

## 2012-04-13 DIAGNOSIS — E559 Vitamin D deficiency, unspecified: Secondary | ICD-10-CM | POA: Diagnosis not present

## 2012-04-13 DIAGNOSIS — R7301 Impaired fasting glucose: Secondary | ICD-10-CM | POA: Diagnosis not present

## 2012-04-20 DIAGNOSIS — Z23 Encounter for immunization: Secondary | ICD-10-CM | POA: Diagnosis not present

## 2012-04-20 DIAGNOSIS — Z0000 Encounter for general adult medical examination without abnormal findings: Secondary | ICD-10-CM | POA: Diagnosis not present

## 2012-04-20 DIAGNOSIS — I10 Essential (primary) hypertension: Secondary | ICD-10-CM | POA: Diagnosis not present

## 2012-04-20 DIAGNOSIS — E785 Hyperlipidemia, unspecified: Secondary | ICD-10-CM | POA: Diagnosis not present

## 2012-04-27 DIAGNOSIS — R933 Abnormal findings on diagnostic imaging of other parts of digestive tract: Secondary | ICD-10-CM | POA: Diagnosis not present

## 2012-05-01 ENCOUNTER — Other Ambulatory Visit: Payer: Self-pay | Admitting: Internal Medicine

## 2012-05-01 DIAGNOSIS — Z1231 Encounter for screening mammogram for malignant neoplasm of breast: Secondary | ICD-10-CM

## 2012-05-18 DIAGNOSIS — D759 Disease of blood and blood-forming organs, unspecified: Secondary | ICD-10-CM | POA: Diagnosis not present

## 2012-06-02 ENCOUNTER — Encounter: Payer: Self-pay | Admitting: Oncology

## 2012-06-02 ENCOUNTER — Telehealth: Payer: Self-pay | Admitting: Oncology

## 2012-06-02 ENCOUNTER — Other Ambulatory Visit (HOSPITAL_BASED_OUTPATIENT_CLINIC_OR_DEPARTMENT_OTHER): Payer: Medicare Other | Admitting: Lab

## 2012-06-02 ENCOUNTER — Ambulatory Visit (HOSPITAL_BASED_OUTPATIENT_CLINIC_OR_DEPARTMENT_OTHER): Payer: Medicare Other | Admitting: Oncology

## 2012-06-02 VITALS — BP 143/81 | HR 70 | Temp 97.6°F | Resp 20 | Ht 63.0 in | Wt 149.4 lb

## 2012-06-02 DIAGNOSIS — D638 Anemia in other chronic diseases classified elsewhere: Secondary | ICD-10-CM | POA: Insufficient documentation

## 2012-06-02 DIAGNOSIS — D72819 Decreased white blood cell count, unspecified: Secondary | ICD-10-CM

## 2012-06-02 DIAGNOSIS — M791 Myalgia, unspecified site: Secondary | ICD-10-CM

## 2012-06-02 DIAGNOSIS — IMO0001 Reserved for inherently not codable concepts without codable children: Secondary | ICD-10-CM

## 2012-06-02 DIAGNOSIS — D649 Anemia, unspecified: Secondary | ICD-10-CM

## 2012-06-02 HISTORY — DX: Anemia, unspecified: D64.9

## 2012-06-02 LAB — CBC WITH DIFFERENTIAL/PLATELET
BASO%: 0.2 % (ref 0.0–2.0)
Basophils Absolute: 0 10*3/uL (ref 0.0–0.1)
EOS%: 5.3 % (ref 0.0–7.0)
Eosinophils Absolute: 0.2 10*3/uL (ref 0.0–0.5)
HCT: 33.9 % — ABNORMAL LOW (ref 34.8–46.6)
HGB: 11.3 g/dL — ABNORMAL LOW (ref 11.6–15.9)
LYMPH%: 47.1 % (ref 14.0–49.7)
MCH: 25.1 pg (ref 25.1–34.0)
MCHC: 33.3 g/dL (ref 31.5–36.0)
MCV: 75.4 fL — ABNORMAL LOW (ref 79.5–101.0)
MONO#: 0.5 10*3/uL (ref 0.1–0.9)
MONO%: 14.7 % — ABNORMAL HIGH (ref 0.0–14.0)
NEUT#: 1.1 10*3/uL — ABNORMAL LOW (ref 1.5–6.5)
NEUT%: 32.7 % — ABNORMAL LOW (ref 38.4–76.8)
Platelets: 164 10*3/uL (ref 145–400)
RBC: 4.5 10*6/uL (ref 3.70–5.45)
RDW: 15.5 % — ABNORMAL HIGH (ref 11.2–14.5)
WBC: 3.3 10*3/uL — ABNORMAL LOW (ref 3.9–10.3)
lymph#: 1.6 10*3/uL (ref 0.9–3.3)

## 2012-06-02 LAB — CHCC SMEAR

## 2012-06-02 LAB — COMPREHENSIVE METABOLIC PANEL (CC13)
ALT: 20 U/L (ref 0–55)
AST: 31 U/L (ref 5–34)
Albumin: 3.7 g/dL (ref 3.5–5.0)
Alkaline Phosphatase: 84 U/L (ref 40–150)
BUN: 15 mg/dL (ref 7.0–26.0)
CO2: 31 mEq/L — ABNORMAL HIGH (ref 22–29)
Calcium: 9.5 mg/dL (ref 8.4–10.4)
Chloride: 99 mEq/L (ref 98–107)
Creatinine: 0.8 mg/dL (ref 0.6–1.1)
Glucose: 90 mg/dl (ref 70–99)
Potassium: 3.9 mEq/L (ref 3.5–5.1)
Sodium: 134 mEq/L — ABNORMAL LOW (ref 136–145)
Total Bilirubin: 0.31 mg/dL (ref 0.20–1.20)
Total Protein: 6.7 g/dL (ref 6.4–8.3)

## 2012-06-02 LAB — MORPHOLOGY: PLT EST: ADEQUATE

## 2012-06-02 LAB — CK: Total CK: 203 U/L — ABNORMAL HIGH (ref 7–177)

## 2012-06-02 NOTE — Patient Instructions (Addendum)
1.   DIAGNOSIS: Leukocytopenia--neutropenia, anemia; NOS. Last bone marrow biopsy in 2007 was hypercellular without MDS and with normal cytogenetics.   2.   CURRENT THERAPY: Active watchful observation.  3.   IMPRESSION:  No change today.  Your white blood cell and Hemoglobin are stable.  There is no clear indication of bone marrow failure or leukemia at this time.    4.   FOLLOW UP:  Lab only appointment in about 6 months.  Return visit in about 1 year.

## 2012-06-02 NOTE — Telephone Encounter (Signed)
Gave pt appts for May 2014 + Nov 2014.

## 2012-06-05 NOTE — Progress Notes (Signed)
[facility]  Telephone:([phone] Fax:([phone]   OFFICE PROGRESS NOTE   Cc:  Kari Baars, MD  DIAGNOSIS: Leukocytopenia--neutropenia, anemia; NOS. Last bone marrow biopsy in 2007 was hypercellular without MDS and with normal cytogenetics.   CURRENT THERAPY: Active watchful observation.  INTERVAL HISTORY: [name] 78 y.o. female returns for regular follow up by herself.  She reports having diffuse muscle pain for the past few years; but for the last few months, it has been getting more prominent.  She does not think that the pain is skeletal at all.  It is chronic; not worsened with activity.  She denied skin rash, muscle weakness.  She is still able to take care of all activities of daily living.    Patient denies fever, anorexia, weight loss, fatigue, headache, visual changes, confusion, drenching night sweats, palpable lymph node swelling, mucositis, odynophagia, dysphagia, nausea vomiting, jaundice, chest pain, palpitation, shortness of breath, dyspnea on exertion, productive cough, gum bleeding, epistaxis, hematemesis, hemoptysis, abdominal pain, abdominal swelling, early satiety, melena, hematochezia, hematuria, skin rash, spontaneous bleeding, heat or cold intolerance, bowel bladder incontinence, back pain, focal motor weakness, paresthesia, depression, suicidal or homicidal ideation, feeling hopelessness.   Past Medical History  Diagnosis Date  . Pneumonia   . Hypothyroidism   . Hypertension   . Hyperlipemia   . CAD (coronary artery disease)   . GERD (gastroesophageal reflux disease)   . Leukocytopenia   . Hypothyroid 09/11/2011  . Osteoporosis 09/11/2011  . Myocardial infarction 09/11/2011    Per pt in 2004  . Arthritis   . Liver mass 09/11/2011    Per report of pt  . Kidney mass 09/11/2011    Per report of pt  . Esophageal abnormality 09/11/2011    Surgically treated per pt.  . Hypercholesterolemia 09/11/2011  . Headache   .  Cataracts, bilateral 09/11/2011    Per report of pt.  . Insect bite of ankle, left 09/11/2011    back of ankle  . Thigh cramp 09/11/2011    Left thigh x 3 nights since starting unspecified psychotropic medication  . Anemia 06/02/2012    Past Surgical History  Procedure Date  . US echocardiography 06-30-2005    Est EF 55-60%  . Cardiovascular stress test 08-15-2007    EF 59%  . Hysterotomy   . Appendectomy   . Cholecystectomy open   . Tonsillectomy   . Knee surgery 09/11/2011  . Gallbladder surgery 09/11/2011  . Abdominal hysterectomy   . Shoulder surgery 09/11/2011    Right side    Current Outpatient Prescriptions  Medication Sig Dispense Refill  . acetaminophen (TYLENOL) 500 MG tablet Take 500 mg by mouth as needed.      Marland Kitchen amLODipine (NORVASC) 10 MG tablet Take 1 tablet (10 mg total) by mouth daily.  30 tablet  5  . aspirin 81 MG tablet Take 81 mg by mouth daily.        . Calcium Carbonate-Vitamin D (CALCIUM + D PO) Take by mouth daily.      . Cholecalciferol (VITAMIN D-3 PO) Take 1,000 mg by mouth daily.       Marland Kitchen CLONIDINE HCL PO Take by mouth. 0.1 mg daily        . docusate sodium (COLACE) 100 MG capsule Take 100 mg by mouth daily.      . hydrochlorothiazide 25 MG tablet Take 25 mg by mouth daily.        Marland Kitchen levothyroxine (SYNTHROID, LEVOTHROID) 150 MCG tablet  Take 125 mcg by mouth daily.       . Loratadine (CLARITIN PO) Take 5 mg by mouth daily.      . Multiple Vitamins-Minerals (CENTRUM SILVER PO) Take by mouth daily.        . nitroGLYCERIN (NITROSTAT) 0.4 MG SL tablet Place 0.4 mg under the tongue every 5 (five) minutes as needed.        . Potassium Chloride (KLOR-CON PO) Take 20 mEq by mouth daily.      . Probiotic Product (ALIGN PO) Take by mouth daily.        Marland Kitchen telmisartan (MICARDIS) 80 MG tablet Take 80 mg by mouth daily.        . temazepam (RESTORIL) 15 MG capsule Take 15 mg by mouth At bedtime as needed. 1 to 2 capsules as needed      . thiothixene (NAVANE) 2 MG capsule  TAKE TWO TABLETS AT BED TIME      . TRAZODONE HCL PO Take 100 mg by mouth at bedtime.         ALLERGIES:  is allergic to cholestatin; doxycycline; prednisone; and statins.  REVIEW OF SYSTEMS:  The rest of the 14-point review of system was negative.   Filed Vitals:   06/02/12 1416  BP: 143/81  Pulse: 70  Temp: 97.6 F (36.4 C)  Resp: 20   Wt Readings from Last 3 Encounters:  06/02/12 149 lb 6.4 oz (67.767 kg)  01/13/12 139 lb 12.8 oz (63.413 kg)  10/19/11 132 lb 12.8 oz (60.238 kg)   ECOG Performance status: 1  PHYSICAL EXAMINATION:   General:  well-nourished woman, in no acute distress.  Eyes:  no scleral icterus.  ENT:  There were no oropharyngeal lesions.  Neck was without thyromegaly.  Lymphatics:  Negative cervical, supraclavicular or axillary adenopathy.  Respiratory: lungs were clear bilaterally without wheezing or crackles.  Cardiovascular:  Regular rate and rhythm, S1/S2, without murmur, rub or gallop.  There was no pedal edema.  GI:  abdomen was soft, flat, nontender, nondistended, without organomegaly.  Muscoloskeletal:  no spinal tenderness of palpation of vertebral spine.  Skin exam was without echymosis, petichae.  Neuro exam was nonfocal.  Patient was able to get on and off exam table without assistance.  Gait was normal.  Patient was alerted and oriented.  Attention was good.   Language was appropriate.  Mood was normal without depression.  Speech was not pressured.  Thought content was not tangential.      LABORATORY/RADIOLOGY DATA:  Lab Results  Component Value Date   WBC 3.3* 06/02/2012   HGB 11.3* 06/02/2012   HCT 33.9* 06/02/2012   PLT 164 06/02/2012   GLUCOSE 90 06/02/2012   CHOL 203* 03/29/2011   TRIG 24.0 03/29/2011   HDL 79.50 03/29/2011   LDLDIRECT 109.5 03/29/2011   ALKPHOS 84 06/02/2012   ALT 20 06/02/2012   AST 31 06/02/2012   NA 134* 06/02/2012   K 3.9 06/02/2012   CL 99 06/02/2012   CREATININE 0.8 06/02/2012   BUN 15.0 06/02/2012   CO2 31*  06/02/2012   INR 0.9 07/07/2007  CK  203 U/L (ref 7-177).  Of note on 07/07/2007, her CK peaked at 316 U/L.    ASSESSMENT AND PLAN:   1.  Leukopenia/anemia:  Chronic; stable.  Negative work up in the past including bone marrow biopsy.  I have low clinical concern for bone marrow suppression at this time due to chronicity and stability of the issues.  She has  not had recurrent infection or bleeding.  I again recommended watchful observation.  In the future, if her leukopenia and anemia significantly worsen, we may consider further work up.   2.  Myalgia:  Unclear etiology.  I sent for CK was elevated.  It was also elevated before in 2008.  She has no rash or muscle weakness.  She claimed that he is up to date with age-appropriate cancer screening.  She is not on statin.  I advised her to follow up with her PCP for further advise.   3.  Follow up:  CBC at the [facility] in about 6 months.  Return visit in about 1 year.      The length of time of the face-to-face encounter was 15 minutes. More than 50% of time was spent counseling and coordination of care.

## 2012-06-06 DIAGNOSIS — F0630 Mood disorder due to known physiological condition, unspecified: Secondary | ICD-10-CM | POA: Diagnosis not present

## 2012-06-09 ENCOUNTER — Ambulatory Visit
Admission: RE | Admit: 2012-06-09 | Discharge: 2012-06-09 | Disposition: A | Discharge status: Home / Self Care | Payer: Medicare Other | Source: Ambulatory Visit | Attending: Internal Medicine | Admitting: Internal Medicine

## 2012-06-09 DIAGNOSIS — Z1231 Encounter for screening mammogram for malignant neoplasm of breast: Secondary | ICD-10-CM | POA: Diagnosis not present

## 2012-06-13 ENCOUNTER — Other Ambulatory Visit: Payer: Self-pay | Admitting: *Deleted

## 2012-06-13 MED ORDER — AMLODIPINE BESYLATE 10 MG PO TABS: 10.0000 mg | ORAL_TABLET | Freq: Every day | ORAL | Status: DC

## 2012-06-13 NOTE — Telephone Encounter (Signed)
Fax Received. Refill Completed. [name] (R.M.A)   

## 2012-06-20 ENCOUNTER — Telehealth: Payer: Self-pay | Admitting: Cardiovascular Disease

## 2012-06-20 NOTE — Telephone Encounter (Signed)
Will forward to Dr Harvie Bridge nurse Alfonso Ramus, RN to determine when the patient can be seen.

## 2012-06-20 NOTE — Telephone Encounter (Signed)
New Problem:    Patient called in wanting to schedule an appointment with Dr. Elease Hashimoto soon because for the past couple of nights she has had a pain in her upper abdomen that travels to her chest.  After taking two nitros the pain subsides into a dull soreness that is centered around her left breast.  Also the muscles in both of her arms are sore after this event.  Please call back.

## 2012-06-20 NOTE — Telephone Encounter (Signed)
Pt describes an upper abdominal pain that transfers to the left chest, pt has hx of hiatal hernia with repair done. She denies sob but states she tried liquid antacid/ without relief. Tried nitro with minimal relief. After cp subsides she states there is a residual ache in chest and bilateral arms, pt said both episodes have been early in the morning like 2 am. Pt declines sooner app, wants to wait till thursday so she can take transportation and not bother her family. Pt said she would go to the er if needed. Pt agreed to plan.

## 2012-06-22 ENCOUNTER — Ambulatory Visit (INDEPENDENT_AMBULATORY_CARE_PROVIDER_SITE_OTHER): Payer: Medicare Other | Admitting: Cardiovascular Disease

## 2012-06-22 ENCOUNTER — Encounter: Payer: Self-pay | Admitting: Cardiovascular Disease

## 2012-06-22 VITALS — BP 110/70 | HR 75 | Ht 63.0 in | Wt 146.0 lb

## 2012-06-22 DIAGNOSIS — R079 Chest pain, unspecified: Secondary | ICD-10-CM | POA: Diagnosis not present

## 2012-06-22 DIAGNOSIS — I2510 Atherosclerotic heart disease of native coronary artery without angina pectoris: Secondary | ICD-10-CM

## 2012-06-22 MED ORDER — FAMOTIDINE 20 MG PO TABS: 20.0000 mg | ORAL_TABLET | Freq: Two times a day (BID) | ORAL | Status: DC

## 2012-06-22 NOTE — Assessment & Plan Note (Addendum)
[name] presents with episodes of chest discomfort. These episodes actually start in her upper abdomen and radiate up to her chest. She does not think they feel like her previous episodes of gastroesophageal reflux pain. She had surgery to correct her hiatal hernia last year and has not been on PPI since that time.  She does have a history of mild coronary artery disease. We will schedule her for a Lexiscan Myoview study for further evaluation.  I've asked her to take famotidine 20 mg twice a day for the next 2-3 weeks to see if this resolves the discomfort.  If the Myoview study is normal and I strongly suspect this to be due to gastroesophageal reflux.  I will see her back in the office in 3 months.

## 2012-06-22 NOTE — Progress Notes (Signed)
[name] Date of Birth  [birth year] [facility] [address]     [address] [place], [place]  [address] [phone]  Fax  [phone]  Problems: 1. Minimal coronary artery disease 2. Hyperlipidemia 3. Chronic cough 4. Hypothyroidism 5. Status post Nissen fundoplication for acid reflux 6. Hypertension  History of Present Illness:  [name] is an elderly female with a history of chest pain with minimal coronary artery disease. She has a history of hyperlipidemia chronic cough she status post Nissen fundoplication for acid reflux. She has a history of hypertension.  She's continued to have some episodes of chest discomfort. These are fairly atypical.  Dec. 4, 2013: She has been having upper abdominal / lower chest discomfort pain.  The pain is eased with NTG.  She does not think the symptoms are due to soft reflux. She stopped her proton pump inhibitor last year. She was  concerned about side effects including osteoporosis.  Current Outpatient Prescriptions on File Prior to Visit  Medication Sig Dispense Refill  . acetaminophen (TYLENOL) 500 MG tablet Take 500 mg by mouth as needed.      Marland Kitchen amLODipine (NORVASC) 10 MG tablet Take 1 tablet (10 mg total) by mouth daily.  30 tablet  5  . aspirin 81 MG tablet Take 81 mg by mouth daily.        . Calcium Carbonate-Vitamin D (CALCIUM + D PO) Take by mouth daily.      . Cholecalciferol (VITAMIN D-3 PO) Take 1,000 mg by mouth daily.       Marland Kitchen CLONIDINE HCL PO Take by mouth. 0.1 mg daily        . docusate sodium (COLACE) 100 MG capsule Take 100 mg by mouth daily.      . hydrochlorothiazide 25 MG tablet Take 25 mg by mouth daily.        Marland Kitchen levothyroxine (SYNTHROID, LEVOTHROID) 150 MCG tablet Take 125 mcg by mouth daily.       . Loratadine (CLARITIN PO) Take 5 mg by mouth daily.      . Multiple Vitamins-Minerals (CENTRUM SILVER PO) Take by mouth daily.        . nitroGLYCERIN (NITROSTAT) 0.4 MG SL  tablet Place 0.4 mg under the tongue every 5 (five) minutes as needed.        . Potassium Chloride (KLOR-CON PO) Take 20 mEq by mouth daily.      . Probiotic Product (ALIGN PO) Take by mouth daily.        Marland Kitchen telmisartan (MICARDIS) 80 MG tablet Take 80 mg by mouth daily.        . temazepam (RESTORIL) 15 MG capsule Take 15 mg by mouth At bedtime as needed. 1 to 2 capsules as needed      . thiothixene (NAVANE) 2 MG capsule TAKE TWO TABLETS AT BED TIME      . TRAZODONE HCL PO Take 100 mg by mouth at bedtime.         Allergies  Allergen Reactions  . Cholestatin   . Doxycycline     REACTION: itching/rash  . Prednisone     REACTION: itching  . Statins     Past Medical History  Diagnosis Date  . Pneumonia   . Hypothyroidism   . Hypertension   . Hyperlipemia   . CAD (coronary artery disease)   . GERD (gastroesophageal reflux disease)   . Leukocytopenia   . Hypothyroid 09/11/2011  . Osteoporosis 09/11/2011  .  Myocardial infarction 09/11/2011    Per pt in 2004  . Arthritis   . Liver mass 09/11/2011    Per report of pt  . Kidney mass 09/11/2011    Per report of pt  . Esophageal abnormality 09/11/2011    Surgically treated per pt.  . Hypercholesterolemia 09/11/2011  . Headache   . Cataracts, bilateral 09/11/2011    Per report of pt.  . Insect bite of ankle, left 09/11/2011    back of ankle  . Thigh cramp 09/11/2011    Left thigh x 3 nights since starting unspecified psychotropic medication  . Anemia 06/02/2012    Past Surgical History  Procedure Date  . US echocardiography 06-30-2005    Est EF 55-60%  . Cardiovascular stress test 08-15-2007    EF 59%  . Hysterotomy   . Appendectomy   . Cholecystectomy open   . Tonsillectomy   . Knee surgery 09/11/2011  . Gallbladder surgery 09/11/2011  . Abdominal hysterectomy   . Shoulder surgery 09/11/2011    Right side    History  Smoking status  . Never Smoker   Smokeless tobacco  . Not on file    History  Alcohol Use No     Family History  Problem Relation Age of Onset  . Hypertension Sister   . Hypertension Brother     Reviw of Systems:  Reviewed in the HPI.  All other systems are negative.  Physical Exam: BP 110/70  Pulse 75  Ht 5\' 3"  (1.6 m)  Wt 146 lb (66.225 kg)  BMI 25.86 kg/m2 The patient is alert and oriented x 3.  The mood and affect are normal.   Skin: warm and dry.  Color is normal.    HEENT:   the sclera are nonicteric.  The mucous membranes are moist.  The carotids are 2+ without bruits.  There is no thyromegaly.  There is no JVD.    Lungs: clear.  The chest wall is non tender.    Heart: regular rate with a normal S1 and S2.  There are no murmurs, gallops, or rubs. The PMI is not displaced.     Abdomen: good bowel sounds.  There is no guarding or rebound.  There is no hepatosplenomegaly or tenderness.  There are no masses.   Extremities:  no clubbing, cyanosis, or edema.  The legs are without rashes.  The distal pulses are intact.   Neuro:  Cranial nerves II - XII are intact.  Motor and sensory functions are intact.    The gait is normal.  EKG: 06/21/2012: Normal sinus rhythm at 82. She has nonspecific ST and T wave.    Assessment / Plan:

## 2012-06-22 NOTE — Patient Instructions (Addendum)
Start Famotidine 20 mg twice a day for 2-3 weeks  Your physician has requested that you have a lexiscan myoview.  Please follow instruction sheet, as given.  Your physician recommends that you schedule a follow-up appointment in: 3 months with Dr Elease Hashimoto

## 2012-06-26 DIAGNOSIS — D759 Disease of blood and blood-forming organs, unspecified: Secondary | ICD-10-CM | POA: Diagnosis not present

## 2012-06-29 ENCOUNTER — Ambulatory Visit (HOSPITAL_COMMUNITY): Payer: Medicare Other | Attending: Internal Medicine | Admitting: Radiology

## 2012-06-29 VITALS — BP 137/71 | Ht 63.0 in | Wt 144.0 lb

## 2012-06-29 DIAGNOSIS — R0602 Shortness of breath: Secondary | ICD-10-CM

## 2012-06-29 DIAGNOSIS — I2510 Atherosclerotic heart disease of native coronary artery without angina pectoris: Secondary | ICD-10-CM | POA: Diagnosis not present

## 2012-06-29 DIAGNOSIS — R079 Chest pain, unspecified: Secondary | ICD-10-CM | POA: Insufficient documentation

## 2012-06-29 DIAGNOSIS — J45909 Unspecified asthma, uncomplicated: Secondary | ICD-10-CM | POA: Diagnosis not present

## 2012-06-29 DIAGNOSIS — I10 Essential (primary) hypertension: Secondary | ICD-10-CM | POA: Insufficient documentation

## 2012-06-29 MED ORDER — TECHNETIUM TC 99M SESTAMIBI GENERIC - CARDIOLITE
30.0000 | Freq: Once | INTRAVENOUS | Status: AC | PRN
  Administered 2012-06-29: 30 via INTRAVENOUS

## 2012-06-29 MED ORDER — AMINOPHYLLINE 25 MG/ML IV SOLN
75.0000 mg | Freq: Once | INTRAVENOUS | Status: AC
  Administered 2012-06-29: 75 mg via INTRAVENOUS

## 2012-06-29 MED ORDER — REGADENOSON 0.4 MG/5ML IV SOLN
0.4000 mg | Freq: Once | INTRAVENOUS | Status: AC
  Administered 2012-06-29: 0.4 mg via INTRAVENOUS

## 2012-06-29 MED ORDER — TECHNETIUM TC 99M SESTAMIBI GENERIC - CARDIOLITE
10.0000 | Freq: Once | INTRAVENOUS | Status: AC | PRN
  Administered 2012-06-29: 10 via INTRAVENOUS

## 2012-06-29 NOTE — Progress Notes (Signed)
[facility] SITE 3 NUCLEAR MED [address] [place], [place] [address] [phone]    Cardiology Nuclear Med Study  [name] is a 78 y.o. female     MRN : [id number]     DOB: 1931/09/28  Procedure Date: 06/29/2012  Nuclear Med Background Indication for Stress Test:  Evaluation for Ischemia and Abnormal OZH:[id number] STT wave changes History:  Asthma and '04 MI, '06 ECHO: EF: 55-60% '07 Heart Cath: EF: 75% NL '09 MPS: 59% Cardiac Risk Factors: Hypertension and Lipids  Symptoms:  Chest Pain and Fatigue   Nuclear Pre-Procedure Caffeine/Decaff Intake:  8:00pm NPO After: 8:00pm   Lungs:  clear O2 Sat: 96% on room air. IV 0.9% NS with Angio Cath:  22g  IV Site: R Antecubital  IV Started by:  Cathlyn Parsons, RN  Chest Size (in):  44 Cup Size: D  Height: 5\' 3"  (1.6 m)  Weight:  144 lb (65.318 kg)  BMI:  Body mass index is 25.51 kg/(m^2). Tech Comments:  n/a    Nuclear Med Study 1 or 2 day study: 1 day  Stress Test Type:  Lexiscan  Reading MD: Leodis Sias MD  Order Authorizing Provider:  Jannette Spanner  Resting Radionuclide: Technetium 67m Sestamibi  Resting Radionuclide Dose: 11.0 mCi   Stress Radionuclide:  Technetium 82m Sestamibi  Stress Radionuclide Dose: 32. mCi           Stress Protocol Rest HR: 53 Stress HR: 86  Rest BP: 137/71 Stress BP: 159/86  Exercise Time (min): n/a METS: n/a   Predicted Max HR: 142 bpm % Max HR: 61.27 bpm Rate Pressure Product: 95284    Dose of Adenosine (mg):  n/a Dose of Lexiscan: 0.4 mg  Dose of Atropine (mg): n/a Dose of Dobutamine: n/a mcg/kg/min (at max HR)  Stress Test Technologist: Milana Na, EMT-P  Nuclear Technologist:  Domenic Polite, CNMT     Rest Procedure:  Myocardial perfusion imaging was performed at rest 45 minutes following the intravenous administration of Technetium 29m Sestamibi. Rest ECG: NSR - Normal EKG  Stress Procedure:  The patient received IV Lexiscan 0.4 mg over  15-seconds.  Technetium 67m Sestamibi injected at 30-seconds.  Quantitative spect images were obtained after a 45 minute delay. This patient had very bad joint pain with the Lexiscan. He was given Aminophylline 75 mg IV and all her symptoms were resolved. Stress ECG: No significant change from baseline ECG  QPS Raw Data Images:  Normal; no motion artifact; normal heart/lung ratio. Stress Images:  There is a small area of mildly decrease uptake in the apex with normal uptake in the other areas.  Rest Images:  There is a small area of moderately decrease uptake in the apex.  the uptake in the remaining areas is normal  Subtraction (SDS):  No evidence of ischemia. Transient Ischemic Dilatation (Normal <1.22):  1.17 Lung/Heart Ratio (Normal <0.45):  0.34  Quantitative Gated Spect Images QGS EDV:  72 ml QGS ESV:  26 ml  Impression Exercise Capacity:  Lexiscan with no exercise. BP Response:  Normal blood pressure response. Clinical Symptoms:  No significant symptoms noted. ECG Impression:  No significant ST segment change suggestive of ischemia. Comparison with Prior Nuclear Study: No images to compare  Overall Impression:  Low risk stress nuclear study.  There is apical thinning but no evidence of ischemia.  The decreased uptake in the apex is actually more prominent in the resting images.   LV Ejection Fraction: 63%.  LV Wall  Motion:  NL LV Function; NL Wall Motion.    Vesta Mixer, Montez Hageman., MD, [facility] 06/29/2012, 4:48 PM Office - [phone] Pager [phone]

## 2012-06-30 ENCOUNTER — Other Ambulatory Visit: Payer: Self-pay | Admitting: Cardiovascular Disease

## 2012-06-30 DIAGNOSIS — R079 Chest pain, unspecified: Secondary | ICD-10-CM

## 2012-06-30 MED ORDER — TELMISARTAN 80 MG PO TABS: 80.0000 mg | ORAL_TABLET | Freq: Every day | ORAL | Status: DC

## 2012-06-30 MED ORDER — FAMOTIDINE 20 MG PO TABS: 20.0000 mg | ORAL_TABLET | Freq: Two times a day (BID) | ORAL | Status: DC

## 2012-06-30 MED ORDER — AMLODIPINE BESYLATE 10 MG PO TABS: 10.0000 mg | ORAL_TABLET | Freq: Every day | ORAL | Status: DC

## 2012-08-07 DIAGNOSIS — IMO0002 Reserved for concepts with insufficient information to code with codable children: Secondary | ICD-10-CM | POA: Diagnosis not present

## 2012-09-05 DIAGNOSIS — D759 Disease of blood and blood-forming organs, unspecified: Secondary | ICD-10-CM | POA: Diagnosis not present

## 2012-09-19 ENCOUNTER — Ambulatory Visit: Payer: Self-pay | Admitting: Cardiovascular Disease

## 2012-09-21 ENCOUNTER — Encounter: Payer: Self-pay | Admitting: Cardiovascular Disease

## 2012-09-21 ENCOUNTER — Encounter: Payer: Self-pay | Admitting: Cardiology

## 2012-09-26 ENCOUNTER — Encounter: Payer: Self-pay | Admitting: Nurse Practitioner

## 2012-09-26 ENCOUNTER — Ambulatory Visit (INDEPENDENT_AMBULATORY_CARE_PROVIDER_SITE_OTHER): Payer: Medicare Other | Admitting: Nurse Practitioner

## 2012-09-26 VITALS — BP 122/82 | HR 68 | Ht 65.5 in | Wt 156.8 lb

## 2012-09-26 DIAGNOSIS — I2510 Atherosclerotic heart disease of native coronary artery without angina pectoris: Secondary | ICD-10-CM

## 2012-09-26 NOTE — Progress Notes (Signed)
[name] Date of Birth: 10-27-1931 Medical Record #[id number]  History of Present Illness: [name] is seen back today for a 3 month check. She is seen for [name]. She is soon to be 81. Has minimal CAD. Normal stress test this past December 2013. Other issues include GERD, HLD, hypothyroidism, depression and HTN.   She was last here in December. Was having some atypical chest pain that seemed more like reflux/GERD. Myoview looked ok. She was given some Pepcid.  She comes in today. She is here alone. Doing ok. Some occasional chest burning that she feels like is reflux. Pepcid works well. Tries to stay active. Has gained weight over the past 3 months but now on some depression medicine which may be contributing to her weight. No falls. Labs are followed by [name].   Current Outpatient Prescriptions on File Prior to Visit  Medication Sig Dispense Refill  . acetaminophen (TYLENOL) 500 MG tablet Take 500 mg by mouth as needed.      Marland Kitchen amLODipine (NORVASC) 10 MG tablet Take 1 tablet (10 mg total) by mouth daily.  90 tablet  3  . aspirin 81 MG tablet Take 81 mg by mouth daily.        . Calcium Carbonate-Vitamin D (CALCIUM + D PO) Take by mouth daily.      . Cholecalciferol (VITAMIN D-3 PO) Take 1,000 mg by mouth daily.       Marland Kitchen CLONIDINE HCL PO Take by mouth. 0.1 mg daily        . docusate sodium (COLACE) 100 MG capsule Take 100 mg by mouth daily.      . famotidine (PEPCID) 20 MG tablet Take 1 tablet (20 mg total) by mouth 2 (two) times daily.  60 tablet  1  . hydrochlorothiazide 25 MG tablet Take 25 mg by mouth daily.        Marland Kitchen levothyroxine (SYNTHROID, LEVOTHROID) 150 MCG tablet Take 125 mcg by mouth daily.       . Loratadine (CLARITIN PO) Take 5 mg by mouth daily.      . Multiple Vitamins-Minerals (CENTRUM SILVER PO) Take by mouth daily.        . nitroGLYCERIN (NITROSTAT) 0.4 MG SL tablet Place 0.4 mg under the tongue every 5 (five) minutes as needed.        . Potassium  Chloride (KLOR-CON PO) Take 20 mEq by mouth daily.      . Probiotic Product (ALIGN PO) Take by mouth daily.        Marland Kitchen telmisartan (MICARDIS) 80 MG tablet Take 1 tablet (80 mg total) by mouth daily.  90 tablet  3  . temazepam (RESTORIL) 15 MG capsule Take 15 mg by mouth At bedtime as needed. 1 to 2 capsules as needed      . thiothixene (NAVANE) 2 MG capsule TAKE TWO TABLETS AT BED TIME       No current facility-administered medications on file prior to visit.    Allergies  Allergen Reactions  . Cholestatin   . Doxycycline     REACTION: itching/rash  . Prednisone     REACTION: itching  . Statins     Past Medical History  Diagnosis Date  . Pneumonia   . Hypothyroidism   . Hypertension   . Hyperlipemia   . CAD (coronary artery disease)   . GERD (gastroesophageal reflux disease)   . Leukocytopenia   . Hypothyroid 09/11/2011  . Osteoporosis 09/11/2011  . Myocardial infarction 09/11/2011  Per pt in 2004  . Arthritis   . Liver mass 09/11/2011    Per report of pt  . Kidney mass 09/11/2011    Per report of pt  . Esophageal abnormality 09/11/2011    Surgically treated per pt.  . Hypercholesterolemia 09/11/2011  . Headache   . Cataracts, bilateral 09/11/2011    Per report of pt.  . Insect bite of ankle, left 09/11/2011    back of ankle  . Thigh cramp 09/11/2011    Left thigh x 3 nights since starting unspecified psychotropic medication  . Anemia 06/02/2012    Past Surgical History  Procedure Laterality Date  . US echocardiography  06-30-2005    Est EF 55-60%  . Cardiovascular stress test  08-15-2007    EF 59%  . Hysterotomy    . Appendectomy    . Cholecystectomy open    . Tonsillectomy    . Knee surgery  09/11/2011  . Gallbladder surgery  09/11/2011  . Abdominal hysterectomy    . Shoulder surgery  09/11/2011    Right side    History  Smoking status  . Never Smoker   Smokeless tobacco  . Not on file    History  Alcohol Use No    Family History  Problem Relation  Age of Onset  . Hypertension Sister   . Hypertension Brother     Review of Systems: The review of systems is per the HPI.  All other systems were reviewed and are negative.  Physical Exam: BP 122/82  Pulse 68  Ht 5' 5.5" (1.664 m)  Wt 156 lb 12.8 oz (71.124 kg)  BMI 25.69 kg/m2 Patient is very pleasant and in no acute distress. She is elderly. Skin is warm and dry. Color is normal.  HEENT is unremarkable. Normocephalic/atraumatic. PERRL. Sclera are nonicteric. Neck is supple. No masses. No JVD. Lungs are clear. Cardiac exam shows a regular rate and rhythm. Abdomen is soft. Extremities are without edema. Gait and ROM are intact. No gross neurologic deficits noted.  LABORATORY DATA:  Lab Results  Component Value Date   WBC 3.3* 06/02/2012   HGB 11.3* 06/02/2012   HCT 33.9* 06/02/2012   PLT 164 06/02/2012   GLUCOSE 90 06/02/2012   CHOL 203* 03/29/2011   TRIG 24.0 03/29/2011   HDL 79.50 03/29/2011   LDLDIRECT 109.5 03/29/2011   ALT 20 06/02/2012   AST 31 06/02/2012   NA 134* 06/02/2012   K 3.9 06/02/2012   CL 99 06/02/2012   CREATININE 0.8 06/02/2012   BUN 15.0 06/02/2012   CO2 31* 06/02/2012   INR 0.9 07/07/2007    Myoview Overall Impression:  Low risk stress nuclear study. There is apical thinning but no evidence of ischemia. The decreased uptake in the apex is actually more prominent in the resting images.  LV Ejection Fraction: 63%. LV Wall Motion: NL LV Function; NL Wall Motion.  [name], [name]., MD, [name]  06/29/2012, 4:48 PM  Office - [phone]  Pager [phone]   Assessment / Plan: 1. HTN - blood pressure looks good.   2. Minimal CAD - recent negative Myoview.  3. GERD - using Pepcid with good results  Overall, she looks to be doing ok. Would continue with her current regimen. See her back in 6 months.   Patient is agreeable to this plan and will call if any problems develop in the interim.

## 2012-09-26 NOTE — Patient Instructions (Addendum)
Try to keep active and stay safe  Stay on your current medicines  See Dr. Elease Hashimoto in 6 months  Call the [facility] office at [phone] if you have any questions, problems or concerns.

## 2012-10-19 DIAGNOSIS — E039 Hypothyroidism, unspecified: Secondary | ICD-10-CM | POA: Diagnosis not present

## 2012-10-19 DIAGNOSIS — I10 Essential (primary) hypertension: Secondary | ICD-10-CM | POA: Diagnosis not present

## 2012-10-19 DIAGNOSIS — R7402 Elevation of levels of lactic acid dehydrogenase (LDH): Secondary | ICD-10-CM | POA: Diagnosis not present

## 2012-10-19 DIAGNOSIS — R7301 Impaired fasting glucose: Secondary | ICD-10-CM | POA: Diagnosis not present

## 2012-10-19 DIAGNOSIS — E785 Hyperlipidemia, unspecified: Secondary | ICD-10-CM | POA: Diagnosis not present

## 2012-10-19 DIAGNOSIS — I2510 Atherosclerotic heart disease of native coronary artery without angina pectoris: Secondary | ICD-10-CM | POA: Diagnosis not present

## 2012-10-19 DIAGNOSIS — R7401 Elevation of levels of liver transaminase levels: Secondary | ICD-10-CM | POA: Diagnosis not present

## 2012-10-19 DIAGNOSIS — F329 Major depressive disorder, single episode, unspecified: Secondary | ICD-10-CM | POA: Diagnosis not present

## 2012-10-19 DIAGNOSIS — J309 Allergic rhinitis, unspecified: Secondary | ICD-10-CM | POA: Diagnosis not present

## 2012-11-01 DIAGNOSIS — D759 Disease of blood and blood-forming organs, unspecified: Secondary | ICD-10-CM | POA: Diagnosis not present

## 2012-11-05 ENCOUNTER — Emergency Department (HOSPITAL_COMMUNITY)
Admission: EM | Admit: 2012-11-05 | Discharge: 2012-11-06 | Disposition: A | Discharge status: Home / Self Care | Payer: Medicare Other | Attending: Emergency Medicine | Admitting: Emergency Medicine

## 2012-11-05 ENCOUNTER — Emergency Department (HOSPITAL_COMMUNITY): Payer: Medicare Other

## 2012-11-05 DIAGNOSIS — Z8701 Personal history of pneumonia (recurrent): Secondary | ICD-10-CM | POA: Diagnosis not present

## 2012-11-05 DIAGNOSIS — Z8739 Personal history of other diseases of the musculoskeletal system and connective tissue: Secondary | ICD-10-CM | POA: Diagnosis not present

## 2012-11-05 DIAGNOSIS — Z8719 Personal history of other diseases of the digestive system: Secondary | ICD-10-CM | POA: Insufficient documentation

## 2012-11-05 DIAGNOSIS — E039 Hypothyroidism, unspecified: Secondary | ICD-10-CM | POA: Diagnosis not present

## 2012-11-05 DIAGNOSIS — Z8669 Personal history of other diseases of the nervous system and sense organs: Secondary | ICD-10-CM | POA: Insufficient documentation

## 2012-11-05 DIAGNOSIS — Z9089 Acquired absence of other organs: Secondary | ICD-10-CM | POA: Insufficient documentation

## 2012-11-05 DIAGNOSIS — J984 Other disorders of lung: Secondary | ICD-10-CM | POA: Diagnosis not present

## 2012-11-05 DIAGNOSIS — Z7982 Long term (current) use of aspirin: Secondary | ICD-10-CM | POA: Diagnosis not present

## 2012-11-05 DIAGNOSIS — I10 Essential (primary) hypertension: Secondary | ICD-10-CM | POA: Diagnosis not present

## 2012-11-05 DIAGNOSIS — Z9889 Other specified postprocedural states: Secondary | ICD-10-CM | POA: Insufficient documentation

## 2012-11-05 DIAGNOSIS — Z862 Personal history of diseases of the blood and blood-forming organs and certain disorders involving the immune mechanism: Secondary | ICD-10-CM | POA: Diagnosis not present

## 2012-11-05 DIAGNOSIS — Z79899 Other long term (current) drug therapy: Secondary | ICD-10-CM | POA: Diagnosis not present

## 2012-11-05 DIAGNOSIS — I252 Old myocardial infarction: Secondary | ICD-10-CM | POA: Diagnosis not present

## 2012-11-05 DIAGNOSIS — R6889 Other general symptoms and signs: Secondary | ICD-10-CM | POA: Insufficient documentation

## 2012-11-05 DIAGNOSIS — R0602 Shortness of breath: Secondary | ICD-10-CM | POA: Diagnosis not present

## 2012-11-05 DIAGNOSIS — K5900 Constipation, unspecified: Secondary | ICD-10-CM

## 2012-11-05 DIAGNOSIS — R05 Cough: Secondary | ICD-10-CM

## 2012-11-05 DIAGNOSIS — M129 Arthropathy, unspecified: Secondary | ICD-10-CM | POA: Diagnosis not present

## 2012-11-05 DIAGNOSIS — K219 Gastro-esophageal reflux disease without esophagitis: Secondary | ICD-10-CM | POA: Insufficient documentation

## 2012-11-05 DIAGNOSIS — M810 Age-related osteoporosis without current pathological fracture: Secondary | ICD-10-CM | POA: Insufficient documentation

## 2012-11-05 DIAGNOSIS — Z87448 Personal history of other diseases of urinary system: Secondary | ICD-10-CM | POA: Diagnosis not present

## 2012-11-05 DIAGNOSIS — T17308A Unspecified foreign body in larynx causing other injury, initial encounter: Secondary | ICD-10-CM | POA: Diagnosis not present

## 2012-11-05 DIAGNOSIS — Z8639 Personal history of other endocrine, nutritional and metabolic disease: Secondary | ICD-10-CM | POA: Insufficient documentation

## 2012-11-05 DIAGNOSIS — R059 Cough, unspecified: Secondary | ICD-10-CM | POA: Diagnosis not present

## 2012-11-05 DIAGNOSIS — Z90710 Acquired absence of both cervix and uterus: Secondary | ICD-10-CM | POA: Diagnosis not present

## 2012-11-05 DIAGNOSIS — I2510 Atherosclerotic heart disease of native coronary artery without angina pectoris: Secondary | ICD-10-CM | POA: Diagnosis not present

## 2012-11-05 LAB — CBC WITH DIFFERENTIAL/PLATELET
Basophils Absolute: 0 10*3/uL (ref 0.0–0.1)
Basophils Relative: 1 % (ref 0–1)
Eosinophils Absolute: 0.1 10*3/uL (ref 0.0–0.7)
Eosinophils Relative: 3 % (ref 0–5)
HCT: 37.8 % (ref 36.0–46.0)
Hemoglobin: 12.7 g/dL (ref 12.0–15.0)
Lymphocytes Relative: 40 % (ref 12–46)
Lymphs Abs: 1.4 10*3/uL (ref 0.7–4.0)
MCH: 24.2 pg — ABNORMAL LOW (ref 26.0–34.0)
MCHC: 33.6 g/dL (ref 30.0–36.0)
MCV: 72.1 fL — ABNORMAL LOW (ref 78.0–100.0)
Monocytes Absolute: 0.5 10*3/uL (ref 0.1–1.0)
Monocytes Relative: 13 % — ABNORMAL HIGH (ref 3–12)
Neutro Abs: 1.5 10*3/uL — ABNORMAL LOW (ref 1.7–7.7)
Neutrophils Relative %: 43 % (ref 43–77)
Platelets: 170 10*3/uL (ref 150–400)
RBC: 5.24 MIL/uL — ABNORMAL HIGH (ref 3.87–5.11)
RDW: 15.2 % (ref 11.5–15.5)
WBC: 3.6 10*3/uL — ABNORMAL LOW (ref 4.0–10.5)

## 2012-11-05 LAB — URINALYSIS, ROUTINE W REFLEX MICROSCOPIC
Bilirubin Urine: NEGATIVE
Glucose, UA: NEGATIVE mg/dL
Ketones, ur: NEGATIVE mg/dL
Leukocytes, UA: NEGATIVE
Nitrite: NEGATIVE
Protein, ur: NEGATIVE mg/dL
Specific Gravity, Urine: 1.012 (ref 1.005–1.030)
Urobilinogen, UA: 0.2 mg/dL (ref 0.0–1.0)
pH: 7 (ref 5.0–8.0)

## 2012-11-05 LAB — URINE MICROSCOPIC-ADD ON

## 2012-11-05 LAB — D-DIMER, QUANTITATIVE: D-Dimer, Quant: 0.58 ug/mL-FEU — ABNORMAL HIGH (ref 0.00–0.48)

## 2012-11-05 NOTE — ED Notes (Signed)
Per EMS report: Pt from [facility]: Pt c/o of irritation in her throat.  Pt has allergies and c/o sinus pressure.  Pt reports feeling of SOB but EMS reports clear lung sounds.  EMS vitals: BP: 174/88, HR: 76R, RR: 18,O2 95% 2L [place].

## 2012-11-05 NOTE — ED Provider Notes (Signed)
History     CSN: [id number]  Arrival date & time 11/05/12  2152   First MD Initiated Contact with Patient 11/05/12 2206      No chief complaint on file.   (Consider location/radiation/quality/duration/timing/severity/associated sxs/prior treatment) HPI Comments: Patient had an episode of coughing and shortness of breath after taking a pill and eating a piece of bread with peanut butter on it. Patient had immediate coughing spell and difficulty catching her breath. She tried to call 911 but was unable to get the phone to work so she ran to her neighbors. She is unable to talk and plantar throat saying she was having trouble breathing. She states she now feels back to baseline denies any shortness of breath, chest pain, difficulty swallowing. She is concerned that she "regurgitated" the water she was drinking. She was doing fine up until this episode today. Good by mouth intake and urine output. No problems with swallowing in the past.  The history is provided by the patient and the EMS personnel.    Past Medical History  Diagnosis Date  . Pneumonia   . Hypothyroidism   . Hypertension   . Hyperlipemia   . CAD (coronary artery disease)   . GERD (gastroesophageal reflux disease)   . Leukocytopenia   . Hypothyroid 09/11/2011  . Osteoporosis 09/11/2011  . Myocardial infarction 09/11/2011    Per pt in 2004  . Arthritis   . Liver mass 09/11/2011    Per report of pt  . Kidney mass 09/11/2011    Per report of pt  . Esophageal abnormality 09/11/2011    Surgically treated per pt.  . Hypercholesterolemia 09/11/2011  . Headache   . Cataracts, bilateral 09/11/2011    Per report of pt.  . Insect bite of ankle, left 09/11/2011    back of ankle  . Thigh cramp 09/11/2011    Left thigh x 3 nights since starting unspecified psychotropic medication  . Anemia 06/02/2012    Past Surgical History  Procedure Laterality Date  . US echocardiography  06-30-2005    Est EF 55-60%  . Cardiovascular stress  test  08-15-2007    EF 59%  . Hysterotomy    . Appendectomy    . Cholecystectomy open    . Tonsillectomy    . Knee surgery  09/11/2011  . Gallbladder surgery  09/11/2011  . Abdominal hysterectomy    . Shoulder surgery  09/11/2011    Right side    Family History  Problem Relation Age of Onset  . Hypertension Sister   . Hypertension Brother     History  Substance Use Topics  . Smoking status: Never Smoker   . Smokeless tobacco: Not on file  . Alcohol Use: No    OB History   Grav Para Term Preterm Abortions TAB SAB Ect Mult Living                  Review of Systems  Constitutional: Negative for fever, activity change and appetite change.  HENT: Negative for congestion and rhinorrhea.   Respiratory: Positive for cough and choking. Negative for shortness of breath.   Cardiovascular: Negative for chest pain.  Gastrointestinal: Negative for nausea, vomiting and abdominal pain.  Genitourinary: Negative for dysuria, hematuria, vaginal bleeding and vaginal discharge.  Musculoskeletal: Negative for back pain.  Skin: Negative for rash.  Neurological: Negative for dizziness, weakness and headaches.  A complete 10 system review of systems was obtained and all systems are negative except as noted in the  HPI and PMH.    Allergies  Cholestatin; Doxycycline; Prednisone; and Statins  Home Medications   Current Outpatient Rx  Name  Route  Sig  Dispense  Refill  . acetaminophen (TYLENOL) 500 MG tablet   Oral   Take 500 mg by mouth every 4 (four) hours as needed for pain.          Marland Kitchen amLODipine (NORVASC) 10 MG tablet   Oral   Take 1 tablet (10 mg total) by mouth daily.   90 tablet   3   . aspirin EC 81 MG tablet   Oral   Take 81 mg by mouth daily.         . cholecalciferol (VITAMIN D) 1000 UNITS tablet   Oral   Take 1,000 Units by mouth daily.         . cloNIDine (CATAPRES) 0.1 MG tablet   Oral   Take 0.1 mg by mouth daily.         Marland Kitchen docusate sodium (COLACE) 100  MG capsule   Oral   Take 100 mg by mouth daily.         . hydrochlorothiazide 25 MG tablet   Oral   Take 25 mg by mouth daily.           Marland Kitchen levothyroxine (SYNTHROID, LEVOTHROID) 125 MCG tablet   Oral   Take 125 mcg by mouth daily before breakfast.         . loratadine (CLARITIN) 10 MG tablet   Oral   Take 10 mg by mouth daily.         . Multiple Vitamin (MULTIVITAMIN WITH MINERALS) TABS   Oral   Take 1 tablet by mouth daily.         . nitroGLYCERIN (NITROSTAT) 0.4 MG SL tablet   Sublingual   Place 0.4 mg under the tongue every 5 (five) minutes as needed.           . potassium chloride SA (K-DUR,KLOR-CON) 20 MEQ tablet   Oral   Take 20 mEq by mouth daily.         . Probiotic Product (ALIGN) 4 MG CAPS   Oral   Take 4 mg by mouth daily.         . propranolol ER (INDERAL LA) 80 MG 24 hr capsule   Oral   Take 80 mg by mouth daily.         Marland Kitchen telmisartan (MICARDIS) 80 MG tablet   Oral   Take 1 tablet (80 mg total) by mouth daily.   90 tablet   3   . temazepam (RESTORIL) 15 MG capsule   Oral   Take 30 mg by mouth At bedtime as needed for sleep or anxiety. 1 to 2 capsules as needed         . vitamin C (ASCORBIC ACID) 500 MG tablet   Oral   Take 500 mg by mouth daily.           BP 131/77  Pulse 82  Temp(Src) 98.3 F (36.8 C) (Oral)  Resp 20  SpO2 98%  Physical Exam  Constitutional: She is oriented to person, place, and time. She appears well-developed and well-nourished. No distress.  HENT:  Head: Normocephalic and atraumatic.  Mouth/Throat: Oropharynx is clear and moist.  Eyes: Conjunctivae and EOM are normal. Pupils are equal, round, and reactive to light.  Neck: Normal range of motion. Neck supple.  Cardiovascular: Normal rate, regular rhythm and normal heart sounds.  No murmur heard. Pulmonary/Chest: Effort normal and breath sounds normal. No respiratory distress.  Abdominal: Soft. There is no tenderness. There is no rebound and no  guarding.  Genitourinary:  No fecal impaction  Musculoskeletal: Normal range of motion. She exhibits no edema and no tenderness.  Neurological: She is alert and oriented to person, place, and time. No cranial nerve deficit. She exhibits normal muscle tone. Coordination normal.  Skin: Skin is warm.    ED Course  Procedures (including critical care time)  Labs Reviewed  CBC WITH DIFFERENTIAL - Abnormal; Notable for the following:    WBC 3.6 (*)    RBC 5.24 (*)    MCV 72.1 (*)    MCH 24.2 (*)    Neutro Abs 1.5 (*)    Monocytes Relative 13 (*)    All other components within normal limits  COMPREHENSIVE METABOLIC PANEL - Abnormal; Notable for the following:    AST 42 (*)    ALT 39 (*)    GFR calc non Af Amer 79 (*)    All other components within normal limits  URINALYSIS, ROUTINE W REFLEX MICROSCOPIC - Abnormal; Notable for the following:    Hgb urine dipstick TRACE (*)    All other components within normal limits  D-DIMER, QUANTITATIVE - Abnormal; Notable for the following:    D-Dimer, Quant 0.58 (*)    All other components within normal limits  LIPASE, BLOOD  TROPONIN I  URINE MICROSCOPIC-ADD ON   Dg Abd Acute W/chest  11/05/2012  *RADIOLOGY REPORT*  Clinical Data: Mid abdominal pain, constipation for 3 days.  ACUTE ABDOMEN SERIES (ABDOMEN 2 VIEW & CHEST 1 VIEW)  Comparison: 05/30/2009.  CT 06/04/2008 of the abdomen and pelvis.  Findings: Frontal view of the chest demonstrates Numerous leads and wires project over the chest.  Patient rotated right.  Cardiomegaly with tortuous descending thoracic aorta. No pleural effusion or pneumothorax.  No congestive failure.  Mild bibasilar scarring or atelectasis.  Abominal films demonstrate convex right lumbar spine curvature.  No free intraperitoneal air or significant air fluid levels on upright positioning.  No bowel dilatation on supine imaging.  There is a moderate amount of colonic stool in the ascending segment.  Distal stool and gas  identified.  No pneumatosis.  Cholecystectomy clips.  IMPRESSION: No acute findings.  Possible constipation.  No acute cardiopulmonary disease.   Original Report Authenticated By: Jeronimo Greaves, M.D.      No diagnosis found.    MDM  episode of coughing after drinking water. No chest pain or shortness of breath now. Feels back to baseline.   CXR clear.  Tolerating pO without vomiting.  No difficulty swallowing.  Abdomen soft and nondistended. Ambulatory without desaturation. D-dimer slightly elevated. Negative stress test Dec 2013.  CTPE pending at time of sign out to Dr. Oletta Lamas.   Date: 11/05/2012  Rate: 64  Rhythm: normal sinus rhythm  QRS Axis: normal  Intervals: normal  ST/T Wave abnormalities: normal  Conduction Disutrbances:none  Narrative Interpretation:   Old EKG Reviewed: unchanged         Glynn Octave, MD 11/06/12 [phone]

## 2012-11-05 NOTE — ED Notes (Signed)
[id number]<NG> Expected date:11/05/12<BR> Expected time: 9:34 PM<BR> Means of arrival:Ambulance<BR> Comments:<BR> Shortness of breath

## 2012-11-06 ENCOUNTER — Emergency Department (HOSPITAL_COMMUNITY): Payer: Medicare Other

## 2012-11-06 ENCOUNTER — Encounter (HOSPITAL_COMMUNITY): Payer: Self-pay

## 2012-11-06 DIAGNOSIS — J984 Other disorders of lung: Secondary | ICD-10-CM | POA: Diagnosis not present

## 2012-11-06 LAB — COMPREHENSIVE METABOLIC PANEL
ALT: 39 U/L — ABNORMAL HIGH (ref 0–35)
AST: 42 U/L — ABNORMAL HIGH (ref 0–37)
Albumin: 3.7 g/dL (ref 3.5–5.2)
Alkaline Phosphatase: 86 U/L (ref 39–117)
BUN: 14 mg/dL (ref 6–23)
CO2: 29 mEq/L (ref 19–32)
Calcium: 9.6 mg/dL (ref 8.4–10.5)
Chloride: 100 mEq/L (ref 96–112)
Creatinine, Ser: 0.73 mg/dL (ref 0.50–1.10)
GFR calc Af Amer: >90 mL/min (ref >90)
GFR calc non Af Amer: 79 mL/min — ABNORMAL LOW (ref >90)
Glucose, Bld: 88 mg/dL (ref 70–99)
Potassium: 3.8 mEq/L (ref 3.5–5.1)
Sodium: 135 mEq/L (ref 135–145)
Total Bilirubin: 0.3 mg/dL (ref 0.3–1.2)
Total Protein: 7.2 g/dL (ref 6.0–8.3)

## 2012-11-06 LAB — TROPONIN I: Troponin I: <0.3 ng/mL (ref <0.30)

## 2012-11-06 LAB — LIPASE, BLOOD: Lipase: 29 U/L (ref 11–59)

## 2012-11-06 MED ORDER — IOHEXOL 350 MG/ML SOLN
100.0000 mL | Freq: Once | INTRAVENOUS | Status: AC | PRN
  Administered 2012-11-06: 100 mL via INTRAVENOUS

## 2012-11-06 MED ORDER — POLYETHYLENE GLYCOL 3350 17 G PO PACK: 17.0000 g | PACK | Freq: Every day | ORAL | Status: DC

## 2012-11-06 NOTE — ED Provider Notes (Signed)
CT chest shows no PE, will discharge to home.    Gavin Pound. Oletta Lamas, MD 11/06/12 [phone]

## 2012-11-10 DIAGNOSIS — I10 Essential (primary) hypertension: Secondary | ICD-10-CM | POA: Diagnosis not present

## 2012-11-10 DIAGNOSIS — K589 Irritable bowel syndrome without diarrhea: Secondary | ICD-10-CM | POA: Diagnosis not present

## 2012-11-10 DIAGNOSIS — K5900 Constipation, unspecified: Secondary | ICD-10-CM | POA: Diagnosis not present

## 2012-11-10 DIAGNOSIS — E039 Hypothyroidism, unspecified: Secondary | ICD-10-CM | POA: Diagnosis not present

## 2012-11-10 DIAGNOSIS — G250 Essential tremor: Secondary | ICD-10-CM | POA: Diagnosis not present

## 2012-11-10 DIAGNOSIS — G252 Other specified forms of tremor: Secondary | ICD-10-CM | POA: Diagnosis not present

## 2012-11-21 DIAGNOSIS — I10 Essential (primary) hypertension: Secondary | ICD-10-CM | POA: Diagnosis not present

## 2012-11-21 DIAGNOSIS — Z6827 Body mass index (BMI) 27.0-27.9, adult: Secondary | ICD-10-CM | POA: Diagnosis not present

## 2012-11-21 DIAGNOSIS — K644 Residual hemorrhoidal skin tags: Secondary | ICD-10-CM | POA: Diagnosis not present

## 2012-11-21 DIAGNOSIS — K5900 Constipation, unspecified: Secondary | ICD-10-CM | POA: Diagnosis not present

## 2012-11-30 ENCOUNTER — Other Ambulatory Visit (HOSPITAL_BASED_OUTPATIENT_CLINIC_OR_DEPARTMENT_OTHER): Payer: Medicare Other | Admitting: Lab

## 2012-11-30 DIAGNOSIS — D649 Anemia, unspecified: Secondary | ICD-10-CM | POA: Diagnosis not present

## 2012-11-30 DIAGNOSIS — D72819 Decreased white blood cell count, unspecified: Secondary | ICD-10-CM

## 2012-11-30 LAB — CBC WITH DIFFERENTIAL/PLATELET
BASO%: 0.4 % (ref 0.0–2.0)
Basophils Absolute: 0 10*3/uL (ref 0.0–0.1)
EOS%: 1.9 % (ref 0.0–7.0)
Eosinophils Absolute: 0.1 10*3/uL (ref 0.0–0.5)
HCT: 37.2 % (ref 34.8–46.6)
HGB: 12 g/dL (ref 11.6–15.9)
LYMPH%: 50 % — ABNORMAL HIGH (ref 14.0–49.7)
MCH: 23.8 pg — ABNORMAL LOW (ref 25.1–34.0)
MCHC: 32.3 g/dL (ref 31.5–36.0)
MCV: 73.9 fL — ABNORMAL LOW (ref 79.5–101.0)
MONO#: 0.5 10*3/uL (ref 0.1–0.9)
MONO%: 16.9 % — ABNORMAL HIGH (ref 0.0–14.0)
NEUT#: 0.9 10*3/uL — ABNORMAL LOW (ref 1.5–6.5)
NEUT%: 30.8 % — ABNORMAL LOW (ref 38.4–76.8)
Platelets: 170 10*3/uL (ref 145–400)
RBC: 5.03 10*6/uL (ref 3.70–5.45)
RDW: 14.6 % — ABNORMAL HIGH (ref 11.2–14.5)
WBC: 3.1 10*3/uL — ABNORMAL LOW (ref 3.9–10.3)
lymph#: 1.5 10*3/uL (ref 0.9–3.3)

## 2012-12-01 ENCOUNTER — Encounter: Payer: Self-pay | Admitting: Oncology

## 2012-12-04 DIAGNOSIS — IMO0002 Reserved for concepts with insufficient information to code with codable children: Secondary | ICD-10-CM | POA: Diagnosis not present

## 2012-12-04 DIAGNOSIS — F22 Delusional disorders: Secondary | ICD-10-CM | POA: Diagnosis not present

## 2012-12-06 DIAGNOSIS — R198 Other specified symptoms and signs involving the digestive system and abdomen: Secondary | ICD-10-CM | POA: Diagnosis not present

## 2012-12-06 DIAGNOSIS — K5900 Constipation, unspecified: Secondary | ICD-10-CM | POA: Diagnosis not present

## 2013-01-01 DIAGNOSIS — R198 Other specified symptoms and signs involving the digestive system and abdomen: Secondary | ICD-10-CM | POA: Diagnosis not present

## 2013-01-01 DIAGNOSIS — K5900 Constipation, unspecified: Secondary | ICD-10-CM | POA: Diagnosis not present

## 2013-01-30 DIAGNOSIS — D759 Disease of blood and blood-forming organs, unspecified: Secondary | ICD-10-CM | POA: Diagnosis not present

## 2013-02-07 ENCOUNTER — Other Ambulatory Visit: Payer: Self-pay | Admitting: Gastroenterology

## 2013-02-07 DIAGNOSIS — R1310 Dysphagia, unspecified: Secondary | ICD-10-CM

## 2013-02-07 DIAGNOSIS — K5900 Constipation, unspecified: Secondary | ICD-10-CM | POA: Diagnosis not present

## 2013-02-16 ENCOUNTER — Ambulatory Visit
Admission: RE | Admit: 2013-02-16 | Discharge: 2013-02-16 | Disposition: A | Discharge status: Home / Self Care | Payer: Medicare Other | Source: Ambulatory Visit | Attending: Gastroenterology | Admitting: Gastroenterology

## 2013-02-16 DIAGNOSIS — R1310 Dysphagia, unspecified: Secondary | ICD-10-CM

## 2013-02-27 DIAGNOSIS — R1310 Dysphagia, unspecified: Secondary | ICD-10-CM | POA: Diagnosis not present

## 2013-02-27 DIAGNOSIS — K208 Other esophagitis without bleeding: Secondary | ICD-10-CM | POA: Diagnosis not present

## 2013-02-27 DIAGNOSIS — K222 Esophageal obstruction: Secondary | ICD-10-CM | POA: Diagnosis not present

## 2013-02-27 DIAGNOSIS — K2100 Gastro-esophageal reflux disease with esophagitis, without bleeding: Secondary | ICD-10-CM | POA: Diagnosis not present

## 2013-02-27 DIAGNOSIS — R933 Abnormal findings on diagnostic imaging of other parts of digestive tract: Secondary | ICD-10-CM | POA: Diagnosis not present

## 2013-03-07 DIAGNOSIS — K222 Esophageal obstruction: Secondary | ICD-10-CM | POA: Diagnosis not present

## 2013-03-30 ENCOUNTER — Encounter: Payer: Self-pay | Admitting: Cardiovascular Disease

## 2013-03-30 ENCOUNTER — Ambulatory Visit (INDEPENDENT_AMBULATORY_CARE_PROVIDER_SITE_OTHER): Payer: Medicare Other | Admitting: Cardiovascular Disease

## 2013-03-30 VITALS — BP 126/64 | HR 56 | Ht 63.5 in | Wt 160.0 lb

## 2013-03-30 DIAGNOSIS — I2510 Atherosclerotic heart disease of native coronary artery without angina pectoris: Secondary | ICD-10-CM | POA: Diagnosis not present

## 2013-03-30 DIAGNOSIS — E785 Hyperlipidemia, unspecified: Secondary | ICD-10-CM | POA: Diagnosis not present

## 2013-03-30 NOTE — Patient Instructions (Addendum)
Your physician wants you to follow-up in: 6 MONTHS You will receive a reminder letter in the mail two months in advance. If you don't receive a letter, please call our office to schedule the follow-up appointment.  Your physician recommends that you return for a FASTING lipid profile: 6 MONTHS  Your physician recommends that you continue on your current medications as directed. Please refer to the Current Medication list given to you today.     

## 2013-03-30 NOTE — Assessment & Plan Note (Signed)
Will see her back in 6 months for repeat labs.

## 2013-03-30 NOTE — Progress Notes (Signed)
[name] Date of Birth  05/24/1932 [facility] / [facility] [address]     [address] [place], [place]  [address] [phone]  Fax  [phone]  Problems: 1. Minimal coronary artery disease 2. Hyperlipidemia 3. Chronic cough 4. Hypothyroidism 5. Status post Nissen fundoplication for acid reflux 6. Hypertension  History of Present Illness:  [name] is an elderly female with a history of chest pain with minimal coronary artery disease. She has a history of hyperlipidemia chronic cough she status post Nissen fundoplication for acid reflux. She has a history of hypertension.  She's continued to have some episodes of chest discomfort. These are fairly atypical.  Dec. 4, 2013: She has been having upper abdominal / lower chest discomfort pain.  The pain is eased with NTG.  She does not think the symptoms are due to soft reflux. She stopped her proton pump inhibitor last year. She was  concerned about side effects including osteoporosis.  Sept. 12, 2014:  She is doing well from a cardiac standpoint.  She continues to have problems with GERD. She has had a nissan fundiplication - helped the cough but now she had recurrent GERD.    She has been gaining weight.  Has some numbness on the lateral aspect of her right legs.    Current Outpatient Prescriptions on File Prior to Visit  Medication Sig Dispense Refill  . acetaminophen (TYLENOL) 500 MG tablet Take 500 mg by mouth every 4 (four) hours as needed for pain.       Marland Kitchen amLODipine (NORVASC) 10 MG tablet Take 1 tablet (10 mg total) by mouth daily.  90 tablet  3  . aspirin EC 81 MG tablet Take 81 mg by mouth daily.      . cloNIDine (CATAPRES) 0.1 MG tablet Take 0.1 mg by mouth daily.      Marland Kitchen docusate sodium (COLACE) 100 MG capsule Take 100 mg by mouth daily.      . hydrochlorothiazide 25 MG tablet Take 25 mg by mouth daily.        Marland Kitchen levothyroxine (SYNTHROID, LEVOTHROID) 125 MCG tablet Take 125 mcg by  mouth daily before breakfast.      . loratadine (CLARITIN) 10 MG tablet Take 10 mg by mouth daily.      . Multiple Vitamin (MULTIVITAMIN WITH MINERALS) TABS Take 1 tablet by mouth daily.      . nitroGLYCERIN (NITROSTAT) 0.4 MG SL tablet Place 0.4 mg under the tongue every 5 (five) minutes as needed.        . polyethylene glycol (MIRALAX) packet Take 17 g by mouth daily.  14 each  0  . potassium chloride SA (K-DUR,KLOR-CON) 20 MEQ tablet Take 20 mEq by mouth daily.      . Probiotic Product (ALIGN) 4 MG CAPS Take 4 mg by mouth daily.      Marland Kitchen telmisartan (MICARDIS) 80 MG tablet Take 1 tablet (80 mg total) by mouth daily.  90 tablet  3  . temazepam (RESTORIL) 15 MG capsule Take 30 mg by mouth At bedtime as needed for sleep or anxiety. 1 to 2 capsules as needed      . vitamin C (ASCORBIC ACID) 500 MG tablet Take 500 mg by mouth daily.      . cholecalciferol (VITAMIN D) 1000 UNITS tablet Take 1,000 Units by mouth daily.      . propranolol ER (INDERAL LA) 80 MG 24 hr capsule Take 80 mg by mouth daily.  No current facility-administered medications on file prior to visit.    Allergies  Allergen Reactions  . Cholestatin   . Doxycycline     REACTION: itching/rash  . Prednisone     REACTION: itching  . Statins     Past Medical History  Diagnosis Date  . Pneumonia   . Hypothyroidism   . Hypertension   . Hyperlipemia   . CAD (coronary artery disease)   . GERD (gastroesophageal reflux disease)   . Leukocytopenia   . Hypothyroid 09/11/2011  . Osteoporosis 09/11/2011  . Myocardial infarction 09/11/2011    Per pt in 2004  . Arthritis   . Liver mass 09/11/2011    Per report of pt  . Kidney mass 09/11/2011    Per report of pt  . Esophageal abnormality 09/11/2011    Surgically treated per pt.  . Hypercholesterolemia 09/11/2011  . Headache(784.0)   . Cataracts, bilateral 09/11/2011    Per report of pt.  . Insect bite of ankle, left 09/11/2011    back of ankle  . Thigh cramp 09/11/2011     Left thigh x 3 nights since starting unspecified psychotropic medication  . Anemia 06/02/2012    Past Surgical History  Procedure Laterality Date  . US echocardiography  06-30-2005    Est EF 55-60%  . Cardiovascular stress test  08-15-2007    EF 59%  . Hysterotomy    . Appendectomy    . Cholecystectomy open    . Tonsillectomy    . Knee surgery  09/11/2011  . Gallbladder surgery  09/11/2011  . Abdominal hysterectomy    . Shoulder surgery  09/11/2011    Right side    History  Smoking status  . Never Smoker   Smokeless tobacco  . Not on file    History  Alcohol Use No    Family History  Problem Relation Age of Onset  . Hypertension Sister   . Hypertension Brother     Reviw of Systems:  Reviewed in the HPI.  All other systems are negative.  Physical Exam: BP 126/64  Pulse 56  Ht 5' 3.5" (1.613 m)  Wt 160 lb (72.576 kg)  BMI 27.89 kg/m2 The patient is alert and oriented x 3.  The mood and affect are normal.   Skin: warm and dry.  Color is normal.    HEENT:   the sclera are nonicteric.  The mucous membranes are moist.  The carotids are 2+ without bruits.  There is no thyromegaly.  There is no JVD.    Lungs: clear.  The chest wall is non tender.    Heart: regular rate with a normal S1 and S2.  There are no murmurs, gallops, or rubs. The PMI is not displaced.     Abdomen: good bowel sounds.  There is no guarding or rebound.  There is no hepatosplenomegaly or tenderness.  There are no masses.   Extremities:  no clubbing, cyanosis, or edema.  The legs are without rashes.  The distal pulses are intact.   Neuro:  Cranial nerves II - XII are intact.  Motor and sensory functions are intact.    The gait is normal.  EKG: 06/21/2012: Normal sinus rhythm at 82. She has nonspecific ST and T wave.    Assessment / Plan:

## 2013-03-30 NOTE — Assessment & Plan Note (Signed)
Stable

## 2013-04-18 ENCOUNTER — Other Ambulatory Visit: Payer: Self-pay

## 2013-04-18 DIAGNOSIS — Z1231 Encounter for screening mammogram for malignant neoplasm of breast: Secondary | ICD-10-CM

## 2013-04-19 DIAGNOSIS — E785 Hyperlipidemia, unspecified: Secondary | ICD-10-CM | POA: Diagnosis not present

## 2013-04-19 DIAGNOSIS — E039 Hypothyroidism, unspecified: Secondary | ICD-10-CM | POA: Diagnosis not present

## 2013-04-19 DIAGNOSIS — E559 Vitamin D deficiency, unspecified: Secondary | ICD-10-CM | POA: Diagnosis not present

## 2013-04-19 DIAGNOSIS — I10 Essential (primary) hypertension: Secondary | ICD-10-CM | POA: Diagnosis not present

## 2013-04-19 DIAGNOSIS — R7301 Impaired fasting glucose: Secondary | ICD-10-CM | POA: Diagnosis not present

## 2013-04-24 DIAGNOSIS — Z0000 Encounter for general adult medical examination without abnormal findings: Secondary | ICD-10-CM | POA: Diagnosis not present

## 2013-04-24 DIAGNOSIS — E039 Hypothyroidism, unspecified: Secondary | ICD-10-CM | POA: Diagnosis not present

## 2013-04-24 DIAGNOSIS — I2510 Atherosclerotic heart disease of native coronary artery without angina pectoris: Secondary | ICD-10-CM | POA: Diagnosis not present

## 2013-04-24 DIAGNOSIS — Z23 Encounter for immunization: Secondary | ICD-10-CM | POA: Diagnosis not present

## 2013-04-24 DIAGNOSIS — G5710 Meralgia paresthetica, unspecified lower limb: Secondary | ICD-10-CM | POA: Diagnosis not present

## 2013-04-24 DIAGNOSIS — D649 Anemia, unspecified: Secondary | ICD-10-CM | POA: Diagnosis not present

## 2013-04-24 DIAGNOSIS — E785 Hyperlipidemia, unspecified: Secondary | ICD-10-CM | POA: Diagnosis not present

## 2013-04-24 DIAGNOSIS — I10 Essential (primary) hypertension: Secondary | ICD-10-CM | POA: Diagnosis not present

## 2013-04-24 DIAGNOSIS — E739 Lactose intolerance, unspecified: Secondary | ICD-10-CM | POA: Diagnosis not present

## 2013-04-24 DIAGNOSIS — F329 Major depressive disorder, single episode, unspecified: Secondary | ICD-10-CM | POA: Diagnosis not present

## 2013-04-26 DIAGNOSIS — Z1212 Encounter for screening for malignant neoplasm of rectum: Secondary | ICD-10-CM | POA: Diagnosis not present

## 2013-04-30 DIAGNOSIS — D759 Disease of blood and blood-forming organs, unspecified: Secondary | ICD-10-CM | POA: Diagnosis not present

## 2013-05-03 ENCOUNTER — Other Ambulatory Visit: Payer: Self-pay | Admitting: Oncology

## 2013-05-04 ENCOUNTER — Telehealth: Payer: Self-pay | Admitting: Hematology and Oncology

## 2013-05-04 NOTE — Telephone Encounter (Signed)
s.w. pt and advised on time change of appt on 11.14.14...Marland Kitchenpt ok and aware

## 2013-05-08 DIAGNOSIS — IMO0002 Reserved for concepts with insufficient information to code with codable children: Secondary | ICD-10-CM | POA: Diagnosis not present

## 2013-05-31 ENCOUNTER — Other Ambulatory Visit: Payer: Self-pay | Admitting: Hematology and Oncology

## 2013-05-31 DIAGNOSIS — D72819 Decreased white blood cell count, unspecified: Secondary | ICD-10-CM | POA: Insufficient documentation

## 2013-05-31 DIAGNOSIS — D509 Iron deficiency anemia, unspecified: Secondary | ICD-10-CM

## 2013-06-01 ENCOUNTER — Ambulatory Visit (HOSPITAL_BASED_OUTPATIENT_CLINIC_OR_DEPARTMENT_OTHER): Payer: Medicare Other | Admitting: Hematology and Oncology

## 2013-06-01 ENCOUNTER — Other Ambulatory Visit (HOSPITAL_COMMUNITY)
Admission: RE | Admit: 2013-06-01 | Discharge: 2013-06-01 | Disposition: A | Discharge status: Home / Self Care | Payer: Medicare Other | Source: Ambulatory Visit | Attending: Hematology and Oncology | Admitting: Hematology and Oncology

## 2013-06-01 ENCOUNTER — Other Ambulatory Visit (HOSPITAL_BASED_OUTPATIENT_CLINIC_OR_DEPARTMENT_OTHER): Payer: Medicare Other

## 2013-06-01 ENCOUNTER — Ambulatory Visit: Payer: Self-pay | Admitting: Oncology

## 2013-06-01 ENCOUNTER — Encounter: Payer: Self-pay | Admitting: Hematology and Oncology

## 2013-06-01 ENCOUNTER — Other Ambulatory Visit: Payer: Self-pay | Admitting: Lab

## 2013-06-01 VITALS — BP 132/77 | HR 62 | Temp 96.5°F | Resp 20 | Ht 63.5 in | Wt 169.9 lb

## 2013-06-01 DIAGNOSIS — D509 Iron deficiency anemia, unspecified: Secondary | ICD-10-CM | POA: Insufficient documentation

## 2013-06-01 DIAGNOSIS — D539 Nutritional anemia, unspecified: Secondary | ICD-10-CM

## 2013-06-01 DIAGNOSIS — D72819 Decreased white blood cell count, unspecified: Secondary | ICD-10-CM | POA: Diagnosis not present

## 2013-06-01 DIAGNOSIS — D649 Anemia, unspecified: Secondary | ICD-10-CM | POA: Diagnosis not present

## 2013-06-01 DIAGNOSIS — D7289 Other specified disorders of white blood cells: Secondary | ICD-10-CM | POA: Diagnosis not present

## 2013-06-01 LAB — CBC & DIFF AND RETIC
BASO%: 0.3 % (ref 0.0–2.0)
Basophils Absolute: 0 10*3/uL (ref 0.0–0.1)
EOS%: 3.2 % (ref 0.0–7.0)
Eosinophils Absolute: 0.1 10*3/uL (ref 0.0–0.5)
HCT: 35.3 % (ref 34.8–46.6)
HGB: 11.5 g/dL — ABNORMAL LOW (ref 11.6–15.9)
Immature Retic Fract: 4.4 % (ref 1.60–10.00)
LYMPH%: 39.9 % (ref 14.0–49.7)
MCH: 23.6 pg — ABNORMAL LOW (ref 25.1–34.0)
MCHC: 32.6 g/dL (ref 31.5–36.0)
MCV: 72.5 fL — ABNORMAL LOW (ref 79.5–101.0)
MONO#: 0.5 10*3/uL (ref 0.1–0.9)
MONO%: 15.5 % — ABNORMAL HIGH (ref 0.0–14.0)
NEUT#: 1.3 10*3/uL — ABNORMAL LOW (ref 1.5–6.5)
NEUT%: 41.1 % (ref 38.4–76.8)
Platelets: 172 10*3/uL (ref 145–400)
RBC: 4.87 10*6/uL (ref 3.70–5.45)
RDW: 14.9 % — ABNORMAL HIGH (ref 11.2–14.5)
Retic %: 1.45 % (ref 0.70–2.10)
Retic Ct Abs: 70.62 10*3/uL (ref 33.70–90.70)
WBC: 3.2 10*3/uL — ABNORMAL LOW (ref 3.9–10.3)
lymph#: 1.3 10*3/uL (ref 0.9–3.3)

## 2013-06-01 LAB — FERRITIN CHCC: Ferritin: 75 ng/ml (ref 9–269)

## 2013-06-01 LAB — CHCC SMEAR

## 2013-06-01 NOTE — Progress Notes (Signed)
[facility] OFFICE PROGRESS NOTE  Kari Baars, MD DIAGNOSIS:  Anemia and leukopenia  SUMMARY OF HEMATOLOGIC HISTORY: This patient has leukopenia and anemia for a while. She had a bone marrow aspirate and biopsy done in 2007 which was normal. INTERVAL HISTORY: [name] 79 y.o. female returns for further followup. The patient denies any recent signs or symptoms of bleeding such as spontaneous epistaxis, hematuria or hematochezia. She denies any recent fever, chills, night sweats or abnormal weight loss Denies any recent infection.  I have reviewed the past medical history, past surgical history, social history and family history with the patient and they are unchanged from previous note.  ALLERGIES:  is allergic to cholestatin; doxycycline; prednisone; and statins.  MEDICATIONS:  Current Outpatient Prescriptions  Medication Sig Dispense Refill  . acetaminophen (TYLENOL) 500 MG tablet Take 500 mg by mouth every 4 (four) hours as needed for pain.       Marland Kitchen amLODipine (NORVASC) 10 MG tablet Take 1 tablet (10 mg total) by mouth daily.  90 tablet  3  . aspirin EC 81 MG tablet Take 81 mg by mouth daily.      Marland Kitchen atenolol (TENORMIN) 25 MG tablet Take 25 mg by mouth daily.      . cholecalciferol (VITAMIN D) 1000 UNITS tablet Take 1,000 Units by mouth daily.      . cloNIDine (CATAPRES) 0.1 MG tablet Take 0.1 mg by mouth daily.      Marland Kitchen docusate sodium (COLACE) 100 MG capsule Take 100 mg by mouth daily.      . Famotidine (PEPCID PO) Take by mouth as needed.      . fluticasone (FLONASE) 50 MCG/ACT nasal spray One spray twice a day      . hydrochlorothiazide 25 MG tablet Take 25 mg by mouth daily.        Marland Kitchen levothyroxine (SYNTHROID, LEVOTHROID) 125 MCG tablet Take 125 mcg by mouth daily before breakfast.      . loratadine (CLARITIN) 10 MG tablet Take 10 mg by mouth daily.      . Multiple Vitamin (MULTIVITAMIN WITH MINERALS) TABS Take 1 tablet by mouth daily.      .  nitroGLYCERIN (NITROSTAT) 0.4 MG SL tablet Place 0.4 mg under the tongue every 5 (five) minutes as needed.        Marland Kitchen OLANZapine (ZYPREXA) 5 MG tablet daily. Take one tab by mouth at bedtime      . omeprazole (PRILOSEC) 40 MG capsule Take 40 mg by mouth daily. Take one tablet a day      . polyethylene glycol (MIRALAX) packet Take 17 g by mouth daily.  14 each  0  . potassium chloride SA (K-DUR,KLOR-CON) 20 MEQ tablet Take 20 mEq by mouth daily.      . Probiotic Product (ALIGN) 4 MG CAPS Take 4 mg by mouth daily.      . propranolol ER (INDERAL LA) 80 MG 24 hr capsule Take 80 mg by mouth daily.      . vitamin C (ASCORBIC ACID) 500 MG tablet Take 500 mg by mouth daily.      Marland Kitchen telmisartan (MICARDIS) 80 MG tablet Take 1 tablet (80 mg total) by mouth daily.  90 tablet  3   No current facility-administered medications for this visit.     REVIEW OF SYSTEMS:   Constitutional: Denies fevers, chills or night sweats All other systems were reviewed with the patient and are negative.  PHYSICAL EXAMINATION: ECOG PERFORMANCE STATUS: 1 -  Symptomatic but completely ambulatory  Filed Vitals:   06/01/13 1423  BP: 132/77  Pulse: 62  Temp: 96.5 F (35.8 C)  Resp: 20   Filed Weights   06/01/13 1423  Weight: 169 lb 14.4 oz (77.066 kg)    GENERAL:alert, no distress and comfortable SKIN: skin color, texture, turgor are normal, no rashes or significant lesions EYES: normal, Conjunctiva are pink and non-injected, sclera clear OROPHARYNX:no exudate, no erythema and lips, buccal mucosa, and tongue normal  NECK: supple, thyroid normal size, non-tender, without nodularity LYMPH:  no palpable lymphadenopathy in the cervical, axillary or inguinal LUNGS: clear to auscultation and percussion with normal breathing effort HEART: regular rate & rhythm and no murmurs and no lower extremity edema ABDOMEN:abdomen soft, non-tender and normal bowel sounds Musculoskeletal:no cyanosis of digits and no clubbing  NEURO:  alert & oriented x 3 with fluent speech, no focal motor/sensory deficits  LABORATORY DATA:  I have reviewed the data as listed Results for orders placed in visit on 06/01/13 (from the past 48 hour(s))  CBC & DIFF AND RETIC     Status: Abnormal   Collection Time    06/01/13  2:06 PM      Result Value Range   WBC 3.2 (*) 3.9 - 10.3 10e3/uL   NEUT# 1.3 (*) 1.5 - 6.5 10e3/uL   HGB 11.5 (*) 11.6 - 15.9 g/dL   HCT 16.1  09.6 - 04.5 %   Platelets 172  145 - 400 10e3/uL   MCV 72.5 (*) 79.5 - 101.0 fL   MCH 23.6 (*) 25.1 - 34.0 pg   MCHC 32.6  31.5 - 36.0 g/dL   RBC 4.09  8.11 - 9.14 10e6/uL   RDW 14.9 (*) 11.2 - 14.5 %   lymph# 1.3  0.9 - 3.3 10e3/uL   MONO# 0.5  0.1 - 0.9 10e3/uL   Eosinophils Absolute 0.1  0.0 - 0.5 10e3/uL   Basophils Absolute 0.0  0.0 - 0.1 10e3/uL   NEUT% 41.1  38.4 - 76.8 %   LYMPH% 39.9  14.0 - 49.7 %   MONO% 15.5 (*) 0.0 - 14.0 %   EOS% 3.2  0.0 - 7.0 %   BASO% 0.3  0.0 - 2.0 %   Retic % 1.45  0.70 - 2.10 %   Retic Ct Abs 70.62  33.70 - 90.70 10e3/uL   Immature Retic Fract 4.40  1.60 - 10.00 %  FERRITIN [facility]     Status: None   Collection Time    06/01/13  2:06 PM      Result Value Range   Ferritin 75  9 - 269 ng/ml  [facility] SMEAR     Status: None   Collection Time    06/01/13  2:06 PM      Result Value Range   Smear Result Smear Available      ASSESSMENT:  #1 microcytic anemia #2 chronic leukopenia  PLAN:  #1 microcytic anemia On review on her older records, she has persistently elevated hemoglobin F. 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 She does not need further workup on this. Ferritin is within normal limits. #2 chronic leukopenia It is not uncommon for African American to have chronic leukopenia. Just to be sure, I am ordering T-cell rearrangement study to rule out LGL. The patient is asymptomatic. We will continue observation. I'll see the patient back in 6 months. All questions were answered. The patient knows to call the clinic with  any problems, questions or concerns. No barriers to  learning was detected.  I spent 15 minutes counseling the patient face to face. The total time spent in the appointment was 20 minutes and more than 50% was on counseling.     [facility], Carlson Belland, MD 06/01/2013 4:35 PM

## 2013-06-04 ENCOUNTER — Telehealth: Payer: Self-pay | Admitting: Hematology and Oncology

## 2013-06-06 LAB — FLOW CYTOMETRY

## 2013-06-11 ENCOUNTER — Ambulatory Visit
Admission: RE | Admit: 2013-06-11 | Discharge: 2013-06-11 | Disposition: A | Discharge status: Home / Self Care | Payer: Medicare Other | Source: Ambulatory Visit

## 2013-06-11 DIAGNOSIS — Z1231 Encounter for screening mammogram for malignant neoplasm of breast: Secondary | ICD-10-CM | POA: Diagnosis not present

## 2013-07-31 DIAGNOSIS — D759 Disease of blood and blood-forming organs, unspecified: Secondary | ICD-10-CM | POA: Diagnosis not present

## 2013-08-22 DIAGNOSIS — F3342 Major depressive disorder, recurrent, in full remission: Secondary | ICD-10-CM | POA: Diagnosis not present

## 2013-09-17 DIAGNOSIS — K5900 Constipation, unspecified: Secondary | ICD-10-CM | POA: Diagnosis not present

## 2013-09-21 DIAGNOSIS — K5909 Other constipation: Secondary | ICD-10-CM | POA: Diagnosis not present

## 2013-09-21 DIAGNOSIS — R05 Cough: Secondary | ICD-10-CM | POA: Diagnosis not present

## 2013-09-21 DIAGNOSIS — R059 Cough, unspecified: Secondary | ICD-10-CM | POA: Diagnosis not present

## 2013-09-21 DIAGNOSIS — F329 Major depressive disorder, single episode, unspecified: Secondary | ICD-10-CM | POA: Diagnosis not present

## 2013-09-21 DIAGNOSIS — Z6829 Body mass index (BMI) 29.0-29.9, adult: Secondary | ICD-10-CM | POA: Diagnosis not present

## 2013-10-02 ENCOUNTER — Emergency Department (HOSPITAL_COMMUNITY): Payer: Medicare Other

## 2013-10-02 ENCOUNTER — Inpatient Hospital Stay (HOSPITAL_COMMUNITY)
Admission: EM | Admit: 2013-10-02 | Discharge: 2013-10-04 | DRG: 645 | Disposition: A | Discharge status: Home / Self Care | Payer: Medicare Other | Attending: Internal Medicine | Admitting: Internal Medicine

## 2013-10-02 ENCOUNTER — Inpatient Hospital Stay (HOSPITAL_COMMUNITY): Payer: Medicare Other

## 2013-10-02 ENCOUNTER — Encounter (HOSPITAL_COMMUNITY): Payer: Self-pay | Admitting: Emergency Medicine

## 2013-10-02 DIAGNOSIS — IMO0001 Reserved for inherently not codable concepts without codable children: Secondary | ICD-10-CM | POA: Diagnosis present

## 2013-10-02 DIAGNOSIS — R05 Cough: Secondary | ICD-10-CM | POA: Diagnosis not present

## 2013-10-02 DIAGNOSIS — R053 Chronic cough: Secondary | ICD-10-CM | POA: Diagnosis present

## 2013-10-02 DIAGNOSIS — K5900 Constipation, unspecified: Secondary | ICD-10-CM | POA: Diagnosis present

## 2013-10-02 DIAGNOSIS — F3289 Other specified depressive episodes: Secondary | ICD-10-CM | POA: Diagnosis present

## 2013-10-02 DIAGNOSIS — E236 Other disorders of pituitary gland: Principal | ICD-10-CM | POA: Diagnosis present

## 2013-10-02 DIAGNOSIS — I2510 Atherosclerotic heart disease of native coronary artery without angina pectoris: Secondary | ICD-10-CM | POA: Diagnosis present

## 2013-10-02 DIAGNOSIS — J9819 Other pulmonary collapse: Secondary | ICD-10-CM | POA: Diagnosis not present

## 2013-10-02 DIAGNOSIS — F419 Anxiety disorder, unspecified: Secondary | ICD-10-CM | POA: Diagnosis present

## 2013-10-02 DIAGNOSIS — I10 Essential (primary) hypertension: Secondary | ICD-10-CM | POA: Diagnosis present

## 2013-10-02 DIAGNOSIS — E0590 Thyrotoxicosis, unspecified without thyrotoxic crisis or storm: Secondary | ICD-10-CM | POA: Diagnosis present

## 2013-10-02 DIAGNOSIS — D649 Anemia, unspecified: Secondary | ICD-10-CM | POA: Diagnosis present

## 2013-10-02 DIAGNOSIS — R1310 Dysphagia, unspecified: Secondary | ICD-10-CM | POA: Diagnosis not present

## 2013-10-02 DIAGNOSIS — G589 Mononeuropathy, unspecified: Secondary | ICD-10-CM | POA: Diagnosis present

## 2013-10-02 DIAGNOSIS — F329 Major depressive disorder, single episode, unspecified: Secondary | ICD-10-CM | POA: Diagnosis present

## 2013-10-02 DIAGNOSIS — F411 Generalized anxiety disorder: Secondary | ICD-10-CM | POA: Diagnosis present

## 2013-10-02 DIAGNOSIS — R059 Cough, unspecified: Secondary | ICD-10-CM | POA: Diagnosis not present

## 2013-10-02 DIAGNOSIS — E039 Hypothyroidism, unspecified: Secondary | ICD-10-CM | POA: Diagnosis not present

## 2013-10-02 DIAGNOSIS — Z860100 Personal history of colon polyps, unspecified: Secondary | ICD-10-CM

## 2013-10-02 DIAGNOSIS — J45909 Unspecified asthma, uncomplicated: Secondary | ICD-10-CM | POA: Diagnosis present

## 2013-10-02 DIAGNOSIS — E785 Hyperlipidemia, unspecified: Secondary | ICD-10-CM | POA: Diagnosis present

## 2013-10-02 DIAGNOSIS — E871 Hypo-osmolality and hyponatremia: Secondary | ICD-10-CM | POA: Diagnosis not present

## 2013-10-02 DIAGNOSIS — H3530 Unspecified macular degeneration: Secondary | ICD-10-CM | POA: Diagnosis present

## 2013-10-02 DIAGNOSIS — K219 Gastro-esophageal reflux disease without esophagitis: Secondary | ICD-10-CM | POA: Diagnosis present

## 2013-10-02 DIAGNOSIS — K5909 Other constipation: Secondary | ICD-10-CM | POA: Diagnosis present

## 2013-10-02 DIAGNOSIS — Z8719 Personal history of other diseases of the digestive system: Secondary | ICD-10-CM

## 2013-10-02 DIAGNOSIS — I252 Old myocardial infarction: Secondary | ICD-10-CM

## 2013-10-02 DIAGNOSIS — D638 Anemia in other chronic diseases classified elsewhere: Secondary | ICD-10-CM | POA: Diagnosis present

## 2013-10-02 DIAGNOSIS — Z79899 Other long term (current) drug therapy: Secondary | ICD-10-CM

## 2013-10-02 DIAGNOSIS — D72819 Decreased white blood cell count, unspecified: Secondary | ICD-10-CM | POA: Diagnosis not present

## 2013-10-02 DIAGNOSIS — R0602 Shortness of breath: Secondary | ICD-10-CM | POA: Diagnosis not present

## 2013-10-02 HISTORY — DX: Anxiety disorder, unspecified: F41.9

## 2013-10-02 HISTORY — DX: Mental disorder, not otherwise specified: F99

## 2013-10-02 LAB — BASIC METABOLIC PANEL
BUN: 6 mg/dL (ref 6–23)
CO2: 27 mEq/L (ref 19–32)
Calcium: 9.4 mg/dL (ref 8.4–10.5)
Chloride: 82 mEq/L — ABNORMAL LOW (ref 96–112)
Creatinine, Ser: 0.7 mg/dL (ref 0.50–1.10)
GFR calc Af Amer: >90 mL/min (ref >90)
GFR calc non Af Amer: 80 mL/min — ABNORMAL LOW (ref >90)
Glucose, Bld: 101 mg/dL — ABNORMAL HIGH (ref 70–99)
Potassium: 3.6 mEq/L — ABNORMAL LOW (ref 3.7–5.3)
Sodium: 121 mEq/L — CL (ref 137–147)

## 2013-10-02 LAB — COMPREHENSIVE METABOLIC PANEL
ALT: 32 U/L (ref 0–35)
AST: 67 U/L — ABNORMAL HIGH (ref 0–37)
Albumin: 4 g/dL (ref 3.5–5.2)
Alkaline Phosphatase: 77 U/L (ref 39–117)
BUN: 7 mg/dL (ref 6–23)
CO2: 25 mEq/L (ref 19–32)
Calcium: 9.9 mg/dL (ref 8.4–10.5)
Chloride: 78 mEq/L — ABNORMAL LOW (ref 96–112)
Creatinine, Ser: 0.82 mg/dL (ref 0.50–1.10)
GFR calc Af Amer: 77 mL/min — ABNORMAL LOW (ref >90)
GFR calc non Af Amer: 66 mL/min — ABNORMAL LOW (ref >90)
Glucose, Bld: 92 mg/dL (ref 70–99)
Potassium: 3.6 mEq/L — ABNORMAL LOW (ref 3.7–5.3)
Sodium: 118 mEq/L — CL (ref 137–147)
Total Bilirubin: 0.5 mg/dL (ref 0.3–1.2)
Total Protein: 6.8 g/dL (ref 6.0–8.3)

## 2013-10-02 LAB — URINALYSIS, ROUTINE W REFLEX MICROSCOPIC
Bilirubin Urine: NEGATIVE
Glucose, UA: NEGATIVE mg/dL
Ketones, ur: 15 mg/dL — AB
Leukocytes, UA: NEGATIVE
Nitrite: NEGATIVE
Protein, ur: NEGATIVE mg/dL
Specific Gravity, Urine: 1.006 (ref 1.005–1.030)
Urobilinogen, UA: 0.2 mg/dL (ref 0.0–1.0)
pH: 5.5 (ref 5.0–8.0)

## 2013-10-02 LAB — CBC WITH DIFFERENTIAL/PLATELET
Basophils Absolute: 0 10*3/uL (ref 0.0–0.1)
Basophils Relative: 0 % (ref 0–1)
Eosinophils Absolute: 0 10*3/uL (ref 0.0–0.7)
Eosinophils Relative: 0 % (ref 0–5)
HCT: 34 % — ABNORMAL LOW (ref 36.0–46.0)
Hemoglobin: 11.9 g/dL — ABNORMAL LOW (ref 12.0–15.0)
Lymphocytes Relative: 42 % (ref 12–46)
Lymphs Abs: 1.1 10*3/uL (ref 0.7–4.0)
MCH: 23.9 pg — ABNORMAL LOW (ref 26.0–34.0)
MCHC: 35 g/dL (ref 30.0–36.0)
MCV: 68.4 fL — ABNORMAL LOW (ref 78.0–100.0)
Monocytes Absolute: 0.3 10*3/uL (ref 0.1–1.0)
Monocytes Relative: 13 % — ABNORMAL HIGH (ref 3–12)
Neutro Abs: 1.2 10*3/uL — ABNORMAL LOW (ref 1.7–7.7)
Neutrophils Relative %: 45 % (ref 43–77)
Platelets: 174 10*3/uL (ref 150–400)
RBC: 4.97 MIL/uL (ref 3.87–5.11)
RDW: 13.7 % (ref 11.5–15.5)
WBC: 2.6 10*3/uL — ABNORMAL LOW (ref 4.0–10.5)

## 2013-10-02 LAB — LACTIC ACID, PLASMA: Lactic Acid, Venous: 0.8 mmol/L (ref 0.5–2.2)

## 2013-10-02 LAB — TROPONIN I: Troponin I: <0.3 ng/mL (ref <0.30)

## 2013-10-02 LAB — PRO B NATRIURETIC PEPTIDE: Pro B Natriuretic peptide (BNP): 88.6 pg/mL (ref 0–450)

## 2013-10-02 LAB — URINE MICROSCOPIC-ADD ON: Urine-Other: NONE SEEN

## 2013-10-02 MED ORDER — PANTOPRAZOLE SODIUM 40 MG PO TBEC
40.0000 mg | DELAYED_RELEASE_TABLET | Freq: Every day | ORAL | Status: DC
  Administered 2013-10-03 – 2013-10-04 (×2): 40 mg via ORAL
  Filled 2013-10-02 (×3): qty 1

## 2013-10-02 MED ORDER — CLONIDINE HCL 0.1 MG PO TABS
0.1000 mg | ORAL_TABLET | Freq: Two times a day (BID) | ORAL | Status: DC
Start: 2013-10-02 — End: 2013-10-04
  Administered 2013-10-02 – 2013-10-04 (×4): 0.1 mg via ORAL
  Filled 2013-10-02 (×5): qty 1

## 2013-10-02 MED ORDER — OLANZAPINE 5 MG PO TABS
5.0000 mg | ORAL_TABLET | Freq: Every day | ORAL | Status: DC
  Filled 2013-10-02 (×3): qty 1

## 2013-10-02 MED ORDER — DOCUSATE SODIUM 100 MG PO CAPS
100.0000 mg | ORAL_CAPSULE | Freq: Two times a day (BID) | ORAL | Status: DC
  Administered 2013-10-02 – 2013-10-04 (×4): 100 mg via ORAL
  Filled 2013-10-02 (×5): qty 1

## 2013-10-02 MED ORDER — SODIUM CHLORIDE 0.9 % IJ SOLN: 3.0000 mL | Freq: Two times a day (BID) | INTRAMUSCULAR | Status: DC

## 2013-10-02 MED ORDER — SODIUM CHLORIDE 0.9 % IV SOLN
INTRAVENOUS | Status: DC
  Administered 2013-10-02: 17:00:00 via INTRAVENOUS

## 2013-10-02 MED ORDER — ENOXAPARIN SODIUM 40 MG/0.4ML ~~LOC~~ SOLN
40.0000 mg | SUBCUTANEOUS | Status: DC
  Administered 2013-10-02 – 2013-10-03 (×2): 40 mg via SUBCUTANEOUS
  Filled 2013-10-02 (×3): qty 0.4

## 2013-10-02 MED ORDER — LINACLOTIDE 145 MCG PO CAPS
145.0000 ug | ORAL_CAPSULE | Freq: Every day | ORAL | Status: DC
  Administered 2013-10-03 – 2013-10-04 (×2): 145 ug via ORAL
  Filled 2013-10-02 (×2): qty 1

## 2013-10-02 MED ORDER — ALBUTEROL SULFATE (2.5 MG/3ML) 0.083% IN NEBU
2.5000 mg | INHALATION_SOLUTION | Freq: Once | RESPIRATORY_TRACT | Status: AC
  Administered 2013-10-02: 2.5 mg via RESPIRATORY_TRACT
  Filled 2013-10-02: qty 3

## 2013-10-02 MED ORDER — ONDANSETRON HCL 4 MG PO TABS: 4.0000 mg | ORAL_TABLET | Freq: Four times a day (QID) | ORAL | Status: DC | PRN

## 2013-10-02 MED ORDER — ASPIRIN EC 81 MG PO TBEC
81.0000 mg | DELAYED_RELEASE_TABLET | Freq: Every day | ORAL | Status: DC
  Administered 2013-10-03 – 2013-10-04 (×2): 81 mg via ORAL
  Filled 2013-10-02 (×2): qty 1

## 2013-10-02 MED ORDER — ACETAMINOPHEN 650 MG RE SUPP: 650.0000 mg | Freq: Four times a day (QID) | RECTAL | Status: DC | PRN

## 2013-10-02 MED ORDER — IOHEXOL 300 MG/ML  SOLN
100.0000 mL | Freq: Once | INTRAMUSCULAR | Status: AC | PRN
  Administered 2013-10-02: 100 mL via INTRAVENOUS

## 2013-10-02 MED ORDER — SACCHAROMYCES BOULARDII 250 MG PO CAPS
250.0000 mg | ORAL_CAPSULE | Freq: Every day | ORAL | Status: DC
  Administered 2013-10-03 – 2013-10-04 (×2): 250 mg via ORAL
  Filled 2013-10-02 (×2): qty 1

## 2013-10-02 MED ORDER — ADULT MULTIVITAMIN W/MINERALS CH
1.0000 | ORAL_TABLET | Freq: Every day | ORAL | Status: DC
  Administered 2013-10-03 – 2013-10-04 (×2): 1 via ORAL
  Filled 2013-10-02 (×2): qty 1

## 2013-10-02 MED ORDER — LEVOTHYROXINE SODIUM 137 MCG PO TABS
137.0000 ug | ORAL_TABLET | Freq: Every day | ORAL | Status: DC
  Administered 2013-10-03 – 2013-10-04 (×2): 137 ug via ORAL
  Filled 2013-10-02 (×3): qty 1

## 2013-10-02 MED ORDER — SODIUM CHLORIDE 0.9 % IN NEBU
3.0000 mL | INHALATION_SOLUTION | Freq: Once | RESPIRATORY_TRACT | Status: AC
  Administered 2013-10-02: 3 mL via RESPIRATORY_TRACT
  Filled 2013-10-02: qty 3

## 2013-10-02 MED ORDER — BISACODYL 5 MG PO TBEC: 5.0000 mg | DELAYED_RELEASE_TABLET | Freq: Every day | ORAL | Status: DC | PRN

## 2013-10-02 MED ORDER — SODIUM CHLORIDE 0.9 % IV SOLN
INTRAVENOUS | Status: DC
  Administered 2013-10-02 – 2013-10-04 (×3): via INTRAVENOUS

## 2013-10-02 MED ORDER — IPRATROPIUM-ALBUTEROL 0.5-2.5 (3) MG/3ML IN SOLN
3.0000 mL | Freq: Once | RESPIRATORY_TRACT | Status: AC
  Administered 2013-10-02: 3 mL via RESPIRATORY_TRACT
  Filled 2013-10-02: qty 3

## 2013-10-02 MED ORDER — POTASSIUM CHLORIDE CRYS ER 20 MEQ PO TBCR
20.0000 meq | EXTENDED_RELEASE_TABLET | Freq: Every day | ORAL | Status: DC
  Administered 2013-10-03 – 2013-10-04 (×2): 20 meq via ORAL
  Filled 2013-10-02 (×2): qty 1

## 2013-10-02 MED ORDER — TRAZODONE HCL 100 MG PO TABS
100.0000 mg | ORAL_TABLET | Freq: Every day | ORAL | Status: DC
  Administered 2013-10-02 – 2013-10-03 (×2): 100 mg via ORAL
  Filled 2013-10-02 (×3): qty 1

## 2013-10-02 MED ORDER — ACETAMINOPHEN 325 MG PO TABS: 650.0000 mg | ORAL_TABLET | Freq: Four times a day (QID) | ORAL | Status: DC | PRN

## 2013-10-02 MED ORDER — AMLODIPINE BESYLATE 10 MG PO TABS
10.0000 mg | ORAL_TABLET | Freq: Every day | ORAL | Status: DC
  Administered 2013-10-03 – 2013-10-04 (×2): 10 mg via ORAL
  Filled 2013-10-02 (×2): qty 1

## 2013-10-02 MED ORDER — POLYETHYLENE GLYCOL 3350 17 G PO PACK
17.0000 g | PACK | Freq: Two times a day (BID) | ORAL | Status: DC
  Administered 2013-10-02 – 2013-10-03 (×3): 17 g via ORAL
  Filled 2013-10-02 (×5): qty 1

## 2013-10-02 MED ORDER — VITAMIN D3 25 MCG (1000 UNIT) PO TABS
1000.0000 [IU] | ORAL_TABLET | Freq: Every day | ORAL | Status: DC
  Administered 2013-10-03 – 2013-10-04 (×2): 1000 [IU] via ORAL
  Filled 2013-10-02 (×2): qty 1

## 2013-10-02 MED ORDER — ALBUTEROL SULFATE (2.5 MG/3ML) 0.083% IN NEBU
2.5000 mg | INHALATION_SOLUTION | Freq: Four times a day (QID) | RESPIRATORY_TRACT | Status: DC
  Administered 2013-10-02: 2.5 mg via RESPIRATORY_TRACT
  Filled 2013-10-02 (×4): qty 3

## 2013-10-02 MED ORDER — LORATADINE 10 MG PO TABS
10.0000 mg | ORAL_TABLET | Freq: Every day | ORAL | Status: DC
  Administered 2013-10-03 – 2013-10-04 (×2): 10 mg via ORAL
  Filled 2013-10-02 (×2): qty 1

## 2013-10-02 MED ORDER — LORAZEPAM 2 MG/ML IJ SOLN
0.2500 mg | Freq: Once | INTRAMUSCULAR | Status: AC
  Administered 2013-10-02: 0.25 mg via INTRAVENOUS
  Filled 2013-10-02: qty 1

## 2013-10-02 MED ORDER — FLEET ENEMA 7-19 GM/118ML RE ENEM: 1.0000 | ENEMA | Freq: Once | RECTAL | Status: AC | PRN

## 2013-10-02 MED ORDER — ONDANSETRON HCL 4 MG/2ML IJ SOLN: 4.0000 mg | Freq: Four times a day (QID) | INTRAMUSCULAR | Status: DC | PRN

## 2013-10-02 MED ORDER — IRBESARTAN 150 MG PO TABS
150.0000 mg | ORAL_TABLET | Freq: Every day | ORAL | Status: DC
Start: 2013-10-03 — End: 2013-10-04
  Administered 2013-10-03 – 2013-10-04 (×2): 150 mg via ORAL
  Filled 2013-10-02 (×2): qty 1

## 2013-10-02 NOTE — H&P (Signed)
PCP:   Marton Redwood, MD   Chief Complaint:  cough  HPI: [name] is a 80 year old American female with a history of chronic cough, prior Nissen fundoplication, generalized anxiety/depression who presented to the emergency department with a complaint of persistent cough. [name] a long-standing history of chronic cough which improved dramatically after her Nissen fundoplication and treatment of anxiety. She had done well until about 4 weeks ago when she developed a recurrent cough.  It is important to note that she's had a long-standing history of somatic symptoms which improved significantly when she started seeing a psychiatrist.   She states that she stopped taking her Zyprexa because she felt like it was making her more constipated and causing abdominal bloating. Concurrent with this she developed recurrence of her cough. She was seen in our office and prescribed a Z-Pak which was not helpful.  I urged her to resume Zyprexa or to discuss alternative with her psychiatrist.  Rolan Lipa was prescribed for her constipation.  Due to persistent cough and constiapation she presented to the emergency department today where chest x-ray shows no acute cardiopulmonary disease. Laboratory evaluation is significant for a sodium of 118 (baseline 135 in 11/14).  She denies any weight loss, fever/chills/sweats. She is having coughing paroxysms which cause her to have difficulty breathing. She is not using an inhaler at home. Given her profound hyponatremia we were called for admission.  Review of Systems:  Review of Systems - All systems reviewed with patient and are negative as in history of present illness the following exceptions: Headache, dry mouth, chronic nausea with inability to vomit since her Nissen fundoplication, abdominal bloating/gas, diffuse myalgias   Past Medical History: CAD s/p MI Chronic cough Overflow incontinence HTN OA Pancytopenia Hyperthyroidism Macular degeneration Chronic  abdominal pain Hyperlipidemia IGT Fibromyalgia GERD Hypothyroidism L foot neuropathy Obesity Depression/Anxiety AR Diverticulosis/diverticulitis Colon polyp Asthma Esophagitis (8/14)  Past Surgical History  Appy TAH/BSO Chole L TKR s/p Nissen Fundoplication (8119)-->[id number] dilation (8/14) Cystoscopy w/ dilation of bladder Excision of urethral caruncle Shoulder surgery Tonsillectomy Vaginal sling surgery    Medications: Prior to Admission medications   Medication Sig Start Date End Date Taking? Authorizing Provider  amLODipine (NORVASC) 10 MG tablet Take 1 tablet (10 mg total) by mouth daily. 06/30/12  Yes Thayer Headings, MD  aspirin EC 81 MG tablet Take 81 mg by mouth daily.   Yes Historical Provider, MD  atenolol (TENORMIN) 25 MG tablet Take 25 mg by mouth daily. 03/28/13  Yes Historical Provider, MD  cholecalciferol (VITAMIN D) 1000 UNITS tablet Take 1,000 Units by mouth daily.   Yes Historical Provider, MD  cloNIDine (CATAPRES) 0.1 MG tablet Take 0.1 mg by mouth 2 (two) times daily.    Yes Historical Provider, MD  docusate sodium (COLACE) 100 MG capsule Take 100 mg by mouth daily.   Yes Historical Provider, MD  hydrochlorothiazide 25 MG tablet Take 25 mg by mouth daily.     Yes Historical Provider, MD  levothyroxine (SYNTHROID, LEVOTHROID) 137 MCG tablet Take 137 mcg by mouth daily before breakfast.   Yes Historical Provider, MD  Linaclotide (LINZESS) 145 MCG CAPS capsule Take 145 mcg by mouth daily.   Yes Historical Provider, MD  loratadine (CLARITIN) 10 MG tablet Take 10 mg by mouth daily.   Yes Historical Provider, MD  Multiple Vitamin (MULTIVITAMIN WITH MINERALS) TABS Take 1 tablet by mouth daily.   Yes Historical Provider, MD  omeprazole (PRILOSEC) 40 MG capsule Take 40 mg by mouth daily. Take  one tablet a day 03/28/13  Yes Historical Provider, MD  polyethylene glycol (MIRALAX) packet Take 17 g by mouth daily. 11/06/12  Yes Ezequiel Essex, MD  potassium chloride  SA (K-DUR,KLOR-CON) 20 MEQ tablet Take 20 mEq by mouth daily.   Yes Historical Provider, MD  Probiotic Product (ALIGN) 4 MG CAPS Take 4 mg by mouth daily.   Yes Historical Provider, MD  telmisartan (MICARDIS) 80 MG tablet Take 1 tablet (80 mg total) by mouth daily. 06/30/12  Yes Thayer Headings, MD  traZODone (DESYREL) 100 MG tablet Take 100 mg by mouth at bedtime. Take one hour before bedtime   Yes Historical Provider, MD  nitroGLYCERIN (NITROSTAT) 0.4 MG SL tablet Place 0.4 mg under the tongue every 5 (five) minutes as needed.      Historical Provider, MD    Allergies:   Allergies  Allergen Reactions  . Cholestatin   . Doxycycline     REACTION: itching/rash  . Prednisone     REACTION: itching  . Statins Other (See Comments)    Muscle hurts    Social History: Single, 1 son, 3 grandchildren, 2 ggc. Domestic work, [employer].  Retired/disability r/t heart. [place].    Family History: Father deceased at 75 (heart dz). Mother deceased at 77 (MI, HTN, hyperlipidemia). Sister w/ RA, DM2.  Sister w/ OA, DM2.  Brother killed. Son healthy.   Physical Exam: Filed Vitals:   10/02/13 1428  BP: 132/70  Pulse: 72  Temp: 98.4 F (36.9 C)  Resp: 16  SpO2: 100%   General appearance: Anxious appearing with intermittent coughing paroxysms resulting in hyperventilation Head: Normocephalic, without obvious abnormality, atraumatic Eyes: conjunctivae/corneas clear. PERRL, EOM's intact.  Nose: Nares normal. Septum midline. Mucosa normal. No drainage or sinus tenderness. Throat: lips, mucosa, and tongue normal; teeth and gums normal Neck: no adenopathy, no carotid bruit, no JVD and thyroid not enlarged, symmetric, no tenderness/mass/nodules Resp: Minimal expiratory wheezing bilaterally primarily in the upper airways, no crackles Cardio: regular rate and rhythm GI: soft, non-tender; bowel sounds normal; no masses,  no organomegaly Extremities: extremities normal, atraumatic, no  cyanosis or edema Pulses: 2+ and symmetric Lymph nodes: Cervical adenopathy: no cervical lymphadenopathy Neurologic: Alert and oriented X 3, normal strength and tone. Normal symmetric reflexes.  Psychiatric: Very anxious appearing, hyperventilating with poor air movement during episodes of hyperventilation and improvement when she breathes slower. Oxygen saturations 100% when breathing regularly and into the low 80s with poor wave form when hyperventilating.  Labs on Admission:   Recent Labs  10/02/13 1545  NA 118*  K 3.6*  CL 78*  CO2 25  GLUCOSE 92  BUN 7  CREATININE 0.82  CALCIUM 9.9    Recent Labs  10/02/13 1545  AST 67*  ALT 32  ALKPHOS 77  BILITOT 0.5  PROT 6.8  ALBUMIN 4.0   No results found for this basename: LIPASE, AMYLASE,  in the last 72 hours  Recent Labs  10/02/13 1545  WBC 2.6*  NEUTROABS 1.2*  HGB 11.9*  HCT 34.0*  MCV 68.4*  PLT 174    Recent Labs  10/02/13 1545  TROPONINI <0.30   Lab Results  Component Value Date   INR 0.9 07/07/2007   No results found for this basename: TSH, T4TOTAL, FREET3, T3FREE, THYROIDAB,  in the last 72 hours No results found for this basename: VITAMINB12, FOLATE, FERRITIN, TIBC, IRON, RETICCTPCT,  in the last 72 hours  Radiological Exams on Admission: Dg Chest 2 View  10/02/2013  CLINICAL DATA:  Cough and shortness of breath.  EXAM: CHEST  2 VIEW  COMPARISON:  CT chest 11/06/2012 and chest radiograph 11/12/ 2010  FINDINGS: Stable mild cardiomegaly. Stable tortuous thoracic aorta contour. Pulmonary vascularity is normal. No airspace disease, effusion, or pneumothorax. Typical degenerative changes of the thoracic spine. Degenerative changes of both shoulders. Visualized bowel gas pattern is normal.  IMPRESSION: Stable mild cardiomegaly.  No acute cardiopulmonary disease.  Degenerative changes of both shoulders.   Electronically Signed   By: Curlene Dolphin M.D.   On: 10/02/2013 15:02   Dg Abd 2 Views  10/02/2013    CLINICAL DATA:  Constipation  EXAM: ABDOMEN - 2 VIEW  COMPARISON:  Acute abdominal November 05 2012  FINDINGS: Probable atelectasis at the right lung base, seen on the supine view projection of the liver. The bowel gas pattern is nonobstructive. Typical amount of stool in the colon noted. No evidence of bowel obstruction. Negative for free intraperitoneal air. No urinary tract stone identified. Degenerative changes of the lower lumbar spine. Cholecystectomy clips.  IMPRESSION: Nonobstructive bowel gas pattern. No significant stool burden visualized.   Electronically Signed   By: Curlene Dolphin M.D.   On: 10/02/2013 16:16   Orders placed during the hospital encounter of 10/02/13  . EKG 12-LEAD  . EKG 12-LEAD  . ED EKG  . ED EKG  . EKG 12-LEAD  . EKG 12-LEAD    Assessment/Plan Principal Problem: 1. Hyponatremia- significant hyponatremia is likely secondary to volume depletion in the setting of HCTZ with concurrent SIADH related to her underlying pulmonary process. Will evaluate further with urine osmolality, serum osmolality, and urine sodium. We'll hydrate with normal saline and follow her sodium level closely to ensure improvement with hydration.  Hold HCTZ. No seizure activity to warrant more aggressive treatment at this time.  We'll evaluate underlying coronary process further with a CT of the chest.  Active Problems: 2. Chronic cough- this [name] been a recurring issue in the past and seems to correlate closest with her psychiatric state. Will obtain a CT of the chest to rule out underlying fibrotic changes. We'll use albuterol nebulizers every 6 hours. Hold atenolol. 3. Anxiety disorder-she [name] profound anxiety which is likely contributing to her cough. It is important to note that she had resolution of most of her somatic symptoms when she was maximally treated for her anxiety and compliant with her Zyprexa which she stopped several weeks ago. We'll try to restart Zyprexa though she is convinced that  this is making her constipation worse. May need to consider a psychiatry consult if no improvement in her psychiatric state. She sees [facility] as an outpatient.  4. Chronic constipation-this [name] been an ongoing issue for her. Will continue Colace twice a day, MiraLAX twice a day, and Linzess.   5. Hypertension-monitor blood pressure with discontinuation of atenolol and HCTZ. May need to consider alternative option.  Clonidine could contribute to her psychiatric issues as well. 6. Hypothyroidism-check TSH 7. Disposition-anticipate discharge to home once her sodium improves if no further inpatient evaluation needed.   Marton Redwood 10/02/2013, 5:36 PM

## 2013-10-02 NOTE — ED Notes (Signed)
Patient is unable to urinate at this time 

## 2013-10-02 NOTE — ED Provider Notes (Signed)
CSN: [id number]     Arrival date & time 10/02/13  1417 History   First MD Initiated Contact with Patient 10/02/13 1503     Chief Complaint  Patient presents with  . Cough  . Shortness of Breath  . Constipation      HPI Pt was seen at 1510.  Per pt and her family, c/o gradual onset and worsening of persistent cough for the past 3 weeks. Pt has been evaluated by her PMD 4 days ago for same, rx zithromax and "cough medicine" which have not improved her symptoms. Pt also c/o gradual onset and persistence of constant "constipation" for the past 3 weeks. Pt states she has been passing a small amount of stool daily, but "still feels constipated." Has been associated with constant nausea. Denies CP/palpitations, no back pain, no abd pain, no vomiting/diarrhea, no fevers, no rash.     Past Medical History  Diagnosis Date  . Pneumonia   . Hypothyroidism   . Hypertension   . Hyperlipemia   . CAD (coronary artery disease)   . GERD (gastroesophageal reflux disease)   . Leukocytopenia   . Hypothyroid 09/11/2011  . Osteoporosis 09/11/2011  . Myocardial infarction 09/11/2011    Per pt in 2004  . Arthritis   . Liver mass 09/11/2011    Per report of pt  . Kidney mass 09/11/2011    Per report of pt  . Esophageal abnormality 09/11/2011    Surgically treated per pt.  . Hypercholesterolemia 09/11/2011  . Headache(784.0)   . Cataracts, bilateral 09/11/2011    Per report of pt.  . Insect bite of ankle, left 09/11/2011    back of ankle  . Thigh cramp 09/11/2011    Left thigh x 3 nights since starting unspecified psychotropic medication  . Anemia 06/02/2012   Past Surgical History  Procedure Laterality Date  . US echocardiography  06-30-2005    Est EF 55-60%  . Cardiovascular stress test  08-15-2007    EF 59%  . Hysterotomy    . Appendectomy    . Cholecystectomy open    . Tonsillectomy    . Knee surgery  09/11/2011  . Gallbladder surgery  09/11/2011  . Abdominal hysterectomy    . Shoulder surgery   09/11/2011    Right side   Family History  Problem Relation Age of Onset  . Hypertension Sister   . Hypertension Brother    History  Substance Use Topics  . Smoking status: Never Smoker   . Smokeless tobacco: Not on file  . Alcohol Use: No    Review of Systems ROS: Statement: All systems negative except as marked or noted in the HPI; Constitutional: Negative for fever and chills. ; ; Eyes: Negative for eye pain, redness and discharge. ; ; ENMT: Negative for ear pain, hoarseness, nasal congestion, sinus pressure and sore throat. ; ; Cardiovascular: Negative for chest pain, palpitations, diaphoresis, dyspnea and peripheral edema. ; ; Respiratory: +cough. Negative for wheezing and stridor. ; ; Gastrointestinal: +constipation, nausea. Negative for vomiting, diarrhea, abdominal pain, blood in stool, hematemesis, jaundice and rectal bleeding. . ; ; Genitourinary: Negative for dysuria, flank pain and hematuria. ; ; Musculoskeletal: Negative for back pain and neck pain. Negative for swelling and trauma.; ; Skin: Negative for pruritus, rash, abrasions, blisters, bruising and skin lesion.; ; Neuro: Negative for headache, lightheadedness and neck stiffness. Negative for weakness, altered level of consciousness , altered mental status, extremity weakness, paresthesias, involuntary movement, seizure and syncope.  Allergies  Cholestatin; Doxycycline; Prednisone; and Statins  Home Medications   Current Outpatient Rx  Name  Route  Sig  Dispense  Refill  . amLODipine (NORVASC) 10 MG tablet   Oral   Take 1 tablet (10 mg total) by mouth daily.   90 tablet   3   . aspirin EC 81 MG tablet   Oral   Take 81 mg by mouth daily.         Marland Kitchen atenolol (TENORMIN) 25 MG tablet   Oral   Take 25 mg by mouth daily.         . cholecalciferol (VITAMIN D) 1000 UNITS tablet   Oral   Take 1,000 Units by mouth daily.         . cloNIDine (CATAPRES) 0.1 MG tablet   Oral   Take 0.1 mg by mouth 2 (two)  times daily.          Marland Kitchen docusate sodium (COLACE) 100 MG capsule   Oral   Take 100 mg by mouth daily.         . hydrochlorothiazide 25 MG tablet   Oral   Take 25 mg by mouth daily.           Marland Kitchen levothyroxine (SYNTHROID, LEVOTHROID) 137 MCG tablet   Oral   Take 137 mcg by mouth daily before breakfast.         . Linaclotide (LINZESS) 145 MCG CAPS capsule   Oral   Take 145 mcg by mouth daily.         Marland Kitchen loratadine (CLARITIN) 10 MG tablet   Oral   Take 10 mg by mouth daily.         . Multiple Vitamin (MULTIVITAMIN WITH MINERALS) TABS   Oral   Take 1 tablet by mouth daily.         Marland Kitchen omeprazole (PRILOSEC) 40 MG capsule   Oral   Take 40 mg by mouth daily. Take one tablet a day         . polyethylene glycol (MIRALAX) packet   Oral   Take 17 g by mouth daily.   14 each   0   . potassium chloride SA (K-DUR,KLOR-CON) 20 MEQ tablet   Oral   Take 20 mEq by mouth daily.         . Probiotic Product (ALIGN) 4 MG CAPS   Oral   Take 4 mg by mouth daily.         Marland Kitchen telmisartan (MICARDIS) 80 MG tablet   Oral   Take 1 tablet (80 mg total) by mouth daily.   90 tablet   3   . traZODone (DESYREL) 100 MG tablet   Oral   Take 100 mg by mouth at bedtime. Take one hour before bedtime         . nitroGLYCERIN (NITROSTAT) 0.4 MG SL tablet   Sublingual   Place 0.4 mg under the tongue every 5 (five) minutes as needed.            BP 132/70  Pulse 72  Temp(Src) 98.4 F (36.9 C)  Resp 16  SpO2 100% Physical Exam 1515: Physical examination:  Nursing notes reviewed; Vital signs and O2 SAT reviewed;  Constitutional: Well developed, Well nourished, Well hydrated, In no acute distress; Head:  Normocephalic, atraumatic; Eyes: EOMI, PERRL, No scleral icterus; ENMT: Mouth and pharynx normal, Mucous membranes moist; Neck: Supple, Full range of motion, No lymphadenopathy; Cardiovascular: Regular rate and rhythm, No gallop; Respiratory: Breath  sounds clear & equal bilaterally,  No wheezes. Speaking full sentences with ease, Normal respiratory effort/excursion; Chest: Nontender, Movement normal; Abdomen: Soft, Nontender, Nondistended, Normal bowel sounds; Genitourinary: No CVA tenderness; Extremities: Pulses normal, No tenderness, No edema, No calf edema or asymmetry.; Neuro: AA&Ox3, Major CN grossly intact.  Speech clear. No gross focal motor or sensory deficits in extremities.; Skin: Color normal, Warm, Dry.; Psych:  Anxiety.     ED Course  Procedures     EKG Interpretation   Date/Time:  Tuesday October 02 2013 15:32:56 EDT Ventricular Rate:  69 PR Interval:  190 QRS Duration: 88 QT Interval:  410 QTC Calculation: 439 R Axis:   64 Text Interpretation:  Sinus rhythm Nonspecific T wave abnormality When  compared with ECG of 11/05/2012 No significant change was found Confirmed  by [facility]  MD, Nunzio Cory [phone]) on 10/02/2013 3:46:19 PM      MDM  MDM Reviewed: previous chart, nursing note and vitals Reviewed previous: labs and ECG Interpretation: labs, ECG and x-ray Total time providing critical care: 30-74 minutes. This excludes time spent performing separately reportable procedures and services. Consults: admitting MD   CRITICAL CARE Performed by: Alfonzo Feller Total critical care time: 35 Critical care time was exclusive of separately billable procedures and treating other patients. Critical care was necessary to treat or prevent imminent or life-threatening deterioration. Critical care was time spent personally by me on the following activities: development of treatment plan with patient and/or surrogate as well as nursing, discussions with consultants, evaluation of patient's response to treatment, examination of patient, obtaining history from patient or surrogate, ordering and performing treatments and interventions, ordering and review of laboratory studies, ordering and review of radiographic studies, pulse oximetry and re-evaluation of  patient's condition.    Results for orders placed during the hospital encounter of 10/02/13  CBC WITH DIFFERENTIAL      Result Value Ref Range   WBC 2.6 (*) 4.0 - 10.5 K/uL   RBC 4.97  3.87 - 5.11 MIL/uL   Hemoglobin 11.9 (*) 12.0 - 15.0 g/dL   HCT 34.0 (*) 36.0 - 46.0 %   MCV 68.4 (*) 78.0 - 100.0 fL   MCH 23.9 (*) 26.0 - 34.0 pg   MCHC 35.0  30.0 - 36.0 g/dL   RDW 13.7  11.5 - 15.5 %   Platelets 174  150 - 400 K/uL   Neutrophils Relative % 45  43 - 77 %   Lymphocytes Relative 42  12 - 46 %   Monocytes Relative 13 (*) 3 - 12 %   Eosinophils Relative 0  0 - 5 %   Basophils Relative 0  0 - 1 %   Neutro Abs 1.2 (*) 1.7 - 7.7 K/uL   Lymphs Abs 1.1  0.7 - 4.0 K/uL   Monocytes Absolute 0.3  0.1 - 1.0 K/uL   Eosinophils Absolute 0.0  0.0 - 0.7 K/uL   Basophils Absolute 0.0  0.0 - 0.1 K/uL   Smear Review MORPHOLOGY UNREMARKABLE    COMPREHENSIVE METABOLIC PANEL      Result Value Ref Range   Sodium 118 (*) 137 - 147 mEq/L   Potassium 3.6 (*) 3.7 - 5.3 mEq/L   Chloride 78 (*) 96 - 112 mEq/L   CO2 25  19 - 32 mEq/L   Glucose, Bld 92  70 - 99 mg/dL   BUN 7  6 - 23 mg/dL   Creatinine, Ser 0.82  0.50 - 1.10 mg/dL   Calcium 9.9  8.4 -  10.5 mg/dL   Total Protein 6.8  6.0 - 8.3 g/dL   Albumin 4.0  3.5 - 5.2 g/dL   AST 67 (*) 0 - 37 U/L   ALT 32  0 - 35 U/L   Alkaline Phosphatase 77  39 - 117 U/L   Total Bilirubin 0.5  0.3 - 1.2 mg/dL   GFR calc non Af Amer 66 (*) >90 mL/min   GFR calc Af Amer 77 (*) >90 mL/min  TROPONIN I      Result Value Ref Range   Troponin I <0.30  <0.30 ng/mL  PRO B NATRIURETIC PEPTIDE      Result Value Ref Range   Pro B Natriuretic peptide (BNP) 88.6  0 - 450 pg/mL  LACTIC ACID, PLASMA      Result Value Ref Range   Lactic Acid, Venous 0.8  0.5 - 2.2 mmol/L   Dg Chest 2 View 10/02/2013   CLINICAL DATA:  Cough and shortness of breath.  EXAM: CHEST  2 VIEW  COMPARISON:  CT chest 11/06/2012 and chest radiograph 11/12/ 2010  FINDINGS: Stable mild cardiomegaly.  Stable tortuous thoracic aorta contour. Pulmonary vascularity is normal. No airspace disease, effusion, or pneumothorax. Typical degenerative changes of the thoracic spine. Degenerative changes of both shoulders. Visualized bowel gas pattern is normal.  IMPRESSION: Stable mild cardiomegaly.  No acute cardiopulmonary disease.  Degenerative changes of both shoulders.   Electronically Signed   By: Curlene Dolphin M.D.   On: 10/02/2013 15:02   Dg Abd 2 Views 10/02/2013   CLINICAL DATA:  Constipation  EXAM: ABDOMEN - 2 VIEW  COMPARISON:  Acute abdominal November 05 2012  FINDINGS: Probable atelectasis at the right lung base, seen on the supine view projection of the liver. The bowel gas pattern is nonobstructive. Typical amount of stool in the colon noted. No evidence of bowel obstruction. Negative for free intraperitoneal air. No urinary tract stone identified. Degenerative changes of the lower lumbar spine. Cholecystectomy clips.  IMPRESSION: Nonobstructive bowel gas pattern. No significant stool burden visualized.   Electronically Signed   By: Curlene Dolphin M.D.   On: 10/02/2013 16:16   Results for [name], [name] (MRN [id number]) as of 10/02/2013 16:51  Ref. Range 06/02/2012 13:43 11/05/2012 23:19 10/02/2013 15:45  Sodium Latest Range: 136-145 mEq/L 134 (L) 135 118 (LL)  Chloride Latest Range: 96-112 mEq/L 99 100 78 (L)    1700:  Na significantly decreased from previous. Will continue IVF. Neb given for coughing without change. Pt anxious and intermittently hyperventilating. Encouraged to calm, deep breathing techniques reviewed with pt with intermittent improvement. Dx and testing d/w pt and family.  Questions answered.  Verb understanding, agreeable to admit.  T/C to Dr. Brigitte Pulse, case discussed, including:  HPI, pertinent PM/SHx, VS/PE, dx testing, ED course and treatment:  Agreeable to admit, requests to obtain urine Na, urine osmol, serum osmol, and he will come to the ED for eval to admit.   1730:  Pt began  to cough and hyperventilate again. Pt apparently "choked on her spit" per ED staff at bedside, followed by brief syncopal episode. No seizure activity, no incont of bowel/bladder, no N/V, no apnea/pulselessness. Pt awoke quickly, A&O. Cool NS neb applied for continued coughing and hyperventilating. Will obtain PCXR r/o aspiration. Long d/w pt's son: he states pt has not been taking her psych meds "for a while now" because "she thinks that's causing her to be constipated" and that when pt takes her psych meds, they improve the symptoms  she is having. Pt's son also states pt "hardly eats anything" since her MI last year. He states pt "adheres to a really really abnormally strict diet."   1750:  Pt continues significantly agitated and anxious. Intermittently hyperventilating. Will dose IV ativan and continue cool NS neb.          Alfonzo Feller, DO 10/05/13 1333

## 2013-10-02 NOTE — Progress Notes (Addendum)
Paged to provide spiritual and emotional support to Ms Shedlock.   When chaplain entered the room Ms Dogan was having a panic attack. She did not wish to cough, spit up, or clear her throat. She was frightened that she could not breath. In support of and in coordination with nurse, Ms Corral's panic attack subsided. Chaplain lead Ms Santellan in mediation/relaxation exercises which began to calm her enough that her breathing became more regular. After medication to relax her further, Ms Janish calmed further.  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 Prayer was requested and provided.  Ms Christman's panic is partially related to her sister's condition. The sister is in the [facility] ICU.  Follow up as paged or in rounding.  Sallee Lange. Jenine Krisher, DMin Chaplain.

## 2013-10-02 NOTE — ED Notes (Signed)
Patient reports that she has had SOB and cough x 3 weeks. Patient states she saw her PCP 4 days ago and was prescribed a Z-pack. Patient states her symptoms are worse. Patient states she is having a productive cough with white sputum. Patient denies any fever. Patient states she has been having constipation x 3 weeks. Patient reports that she has a small amount of liquid stool daily, but still feels constipated. Patient states she feels nauseated constantly.

## 2013-10-02 NOTE — ED Notes (Signed)
Floor not able to receive report.

## 2013-10-02 NOTE — ED Notes (Signed)
Nurses on unit doing shift report. ED awaiting call back for report.

## 2013-10-02 NOTE — Progress Notes (Signed)
CRITICAL VALUE ALERT  Critical value received:  Sodium 121  Date of notification:  10/02/2013  Time of notification:  2200  Critical value read back:yes  Nurse who received alert:  Carnella Guadalajara I  MD notified (1st page):  N/A  Time of first page:  N/A  Responding MD:  N/A  Time MD responded:  N/A  Sodium level was 118 previously; it has increased. MD is aware that pt's Sodium level is low so I did not page. Will continue to monitor pt closely. Carnella Guadalajara I

## 2013-10-02 NOTE — ED Notes (Signed)
Patient's bed request was changed from one unit to another thus the delay .

## 2013-10-03 ENCOUNTER — Inpatient Hospital Stay (HOSPITAL_COMMUNITY): Payer: Medicare Other

## 2013-10-03 ENCOUNTER — Ambulatory Visit: Payer: Self-pay | Admitting: Cardiovascular Disease

## 2013-10-03 DIAGNOSIS — E871 Hypo-osmolality and hyponatremia: Secondary | ICD-10-CM | POA: Diagnosis not present

## 2013-10-03 DIAGNOSIS — D649 Anemia, unspecified: Secondary | ICD-10-CM | POA: Diagnosis not present

## 2013-10-03 DIAGNOSIS — K5900 Constipation, unspecified: Secondary | ICD-10-CM | POA: Diagnosis not present

## 2013-10-03 DIAGNOSIS — E039 Hypothyroidism, unspecified: Secondary | ICD-10-CM | POA: Diagnosis not present

## 2013-10-03 DIAGNOSIS — D72819 Decreased white blood cell count, unspecified: Secondary | ICD-10-CM | POA: Diagnosis not present

## 2013-10-03 DIAGNOSIS — R1310 Dysphagia, unspecified: Secondary | ICD-10-CM | POA: Diagnosis not present

## 2013-10-03 LAB — BASIC METABOLIC PANEL
BUN: 5 mg/dL — ABNORMAL LOW (ref 6–23)
CO2: 26 mEq/L (ref 19–32)
Calcium: 9.1 mg/dL (ref 8.4–10.5)
Chloride: 88 mEq/L — ABNORMAL LOW (ref 96–112)
Creatinine, Ser: 0.7 mg/dL (ref 0.50–1.10)
GFR calc Af Amer: >90 mL/min (ref >90)
GFR calc non Af Amer: 80 mL/min — ABNORMAL LOW (ref >90)
Glucose, Bld: 79 mg/dL (ref 70–99)
Potassium: 3.6 mEq/L — ABNORMAL LOW (ref 3.7–5.3)
Sodium: 126 mEq/L — ABNORMAL LOW (ref 137–147)

## 2013-10-03 LAB — URINE CULTURE
Colony Count: NO GROWTH
Culture: NO GROWTH

## 2013-10-03 LAB — CBC
HCT: 32.3 % — ABNORMAL LOW (ref 36.0–46.0)
Hemoglobin: 10.7 g/dL — ABNORMAL LOW (ref 12.0–15.0)
MCH: 23.3 pg — ABNORMAL LOW (ref 26.0–34.0)
MCHC: 33.1 g/dL (ref 30.0–36.0)
MCV: 70.2 fL — ABNORMAL LOW (ref 78.0–100.0)
Platelets: 155 10*3/uL (ref 150–400)
RBC: 4.6 MIL/uL (ref 3.87–5.11)
RDW: 14.1 % (ref 11.5–15.5)
WBC: 2.2 10*3/uL — ABNORMAL LOW (ref 4.0–10.5)

## 2013-10-03 LAB — TSH: TSH: 1.576 u[IU]/mL (ref 0.350–4.500)

## 2013-10-03 LAB — SODIUM, URINE, RANDOM: Sodium, Ur: 24 mEq/L

## 2013-10-03 LAB — OSMOLALITY: Osmolality: 244 mOsm/kg — CL (ref 275–300)

## 2013-10-03 LAB — OSMOLALITY, URINE: Osmolality, Ur: 181 mOsm/kg — ABNORMAL LOW (ref 390–1090)

## 2013-10-03 MED ORDER — SORBITOL 70 % SOLN
960.0000 mL | TOPICAL_OIL | Freq: Once | ORAL | Status: AC
  Administered 2013-10-03: 960 mL via RECTAL
  Filled 2013-10-03: qty 240

## 2013-10-03 MED ORDER — ALBUTEROL SULFATE (2.5 MG/3ML) 0.083% IN NEBU: 2.5000 mg | INHALATION_SOLUTION | RESPIRATORY_TRACT | Status: DC | PRN

## 2013-10-03 NOTE — Progress Notes (Signed)
Subjective: She reports that she continues to have coughing fits and gets short of breath but appears to be resting comfortably with no witnessed coughing during exam.  Endorses some dysphagia symptoms.  Unsure if cough worsens after eating.  Still very constipated.    Objective: Vital signs in last 24 hours: Temp:  [98.1 F (36.7 C)-99.1 F (37.3 C)] 98.1 F (36.7 C) (03/18 0454) Pulse Rate:  [61-82] 61 (03/18 0454) Resp:  [16-22] 18 (03/18 0454) BP: (103-132)/(52-70) 103/52 mmHg (03/18 0454) SpO2:  [97 %-100 %] 100 % (03/18 0454) Weight:  [70.7 kg (155 lb 13.8 oz)] 70.7 kg (155 lb 13.8 oz) (03/17 2037) Weight change:     CBG (last 3)  No results found for this basename: GLUCAP,  in the last 72 hours  Intake/Output from previous day: 03/17 0701 - 03/18 0700 In: 791.7 [I.V.:791.7] Out: 1275 [Urine:1275] Intake/Output this shift:    General appearance: alert and no distress Eyes: no scleral icterus Throat: oropharynx moist without erythema Resp: clear to auscultation bilaterally; no wheezing Cardio: regular rate and rhythm GI: soft, non-tender; bowel sounds normal; no masses,  no organomegaly Extremities: no clubbing, cyanosis or edema Psych: appears less anxious   Lab Results:  Recent Labs  10/02/13 1949 10/03/13 0540  NA 121* 126*  K 3.6* 3.6*  CL 82* 88*  CO2 27 26  GLUCOSE 101* 79  BUN 6 5*  CREATININE 0.70 0.70  CALCIUM 9.4 9.1    Recent Labs  10/02/13 1545  AST 67*  ALT 32  ALKPHOS 77  BILITOT 0.5  PROT 6.8  ALBUMIN 4.0    Recent Labs  10/02/13 1545 10/03/13 0540  WBC 2.6* 2.2*  NEUTROABS 1.2*  --   HGB 11.9* 10.7*  HCT 34.0* 32.3*  MCV 68.4* 70.2*  PLT 174 155   Lab Results  Component Value Date   INR 0.9 07/07/2007    Recent Labs  10/02/13 1545  TROPONINI <0.30   Urine Na- 24 Urine Osm- 181 Serum Osm- 244  Studies/Results: Dg Chest 2 View  10/02/2013   CLINICAL DATA:  Cough and shortness of breath.  EXAM: CHEST  2 VIEW   COMPARISON:  CT chest 11/06/2012 and chest radiograph 11/12/ 2010  FINDINGS: Stable mild cardiomegaly. Stable tortuous thoracic aorta contour. Pulmonary vascularity is normal. No airspace disease, effusion, or pneumothorax. Typical degenerative changes of the thoracic spine. Degenerative changes of both shoulders. Visualized bowel gas pattern is normal.  IMPRESSION: Stable mild cardiomegaly.  No acute cardiopulmonary disease.  Degenerative changes of both shoulders.   Electronically Signed   By: Curlene Dolphin M.D.   On: 10/02/2013 15:02   Ct Chest W Contrast  10/02/2013   CLINICAL DATA:  Chronic cough.  EXAM: CT CHEST WITH CONTRAST  TECHNIQUE: Multidetector CT imaging of the chest was performed during intravenous contrast administration.  CONTRAST:  100 mL OMNIPAQUE IOHEXOL 300 MG/ML  SOLN  COMPARISON:  CT chest 04/21/20146 and 04/24/2006. Plain film of the chest 10/02/2013 at 14:53 and 18:15 hr  FINDINGS: There is no axillary, hilar or mediastinal lymphadenopathy. Calcific coronary and aortic atherosclerosis is noted. No pleural or pericardial effusion. Heart size is normal. There is food material throughout almost the entirety of the esophagus. The esophagus is not dilated. The patient is status post Nissen fundoplication. A 0.4 cm right apical nodule on image 7 is unchanged since 2007. Mild, chronic scarring in the bases is also stable in appearance. The lungs are otherwise unremarkable. Incidentally imaged upper abdomen demonstrates  postoperative change of cholecystectomy. No lytic or sclerotic bony lesion is seen. Thoracic spondylosis is noted.  IMPRESSION: No acute abnormality.  Food within the esophagus could be due to poor motility or stenosis from fundoplication. No mass is identified.   Electronically Signed   By: Inge Rise M.D.   On: 10/02/2013 23:56   Dg Chest Port 1 View  10/02/2013   CLINICAL DATA:  Cough, shortness of breath, weakness  EXAM: PORTABLE CHEST - 1 VIEW  COMPARISON:  DG CHEST  2 VIEW dated 10/02/2013; DG ESOPHAGUS-BA SW dated 02/16/2013; CT ANGIO CHEST W/CM &/OR WO/CM dated 11/06/2012  FINDINGS: There is gaseous distention of the stomach. There is elevation of the left diaphragm. Cardiac silhouette within normal limits. Vascular pattern normal. Mild density left lung base appears most consistent with atelectasis. Mild right base opacity appears similarly atelectatic, with depression of the horizontal fissure. Moderate bilateral shoulder arthritis is stable.  IMPRESSION: Bibasilar opacities appear most consistent with atelectasis particularly on the left due to diaphragmatic elevation with gaseous distention of the stomach. There is atelectasis in the right lower lobe as well but the possibility of superimposed pneumonia is not entirely excluded. If symptoms persist consider follow-up radiographs.   Electronically Signed   By: Skipper Cliche M.D.   On: 10/02/2013 18:43   Dg Abd 2 Views  10/02/2013   CLINICAL DATA:  Constipation  EXAM: ABDOMEN - 2 VIEW  COMPARISON:  Acute abdominal November 05 2012  FINDINGS: Probable atelectasis at the right lung base, seen on the supine view projection of the liver. The bowel gas pattern is nonobstructive. Typical amount of stool in the colon noted. No evidence of bowel obstruction. Negative for free intraperitoneal air. No urinary tract stone identified. Degenerative changes of the lower lumbar spine. Cholecystectomy clips.  IMPRESSION: Nonobstructive bowel gas pattern. No significant stool burden visualized.   Electronically Signed   By: Curlene Dolphin M.D.   On: 10/02/2013 16:16     Medications: Scheduled: . albuterol  2.5 mg Nebulization Q6H  . amLODipine  10 mg Oral Daily  . aspirin EC  81 mg Oral Daily  . cholecalciferol  1,000 Units Oral Daily  . cloNIDine  0.1 mg Oral BID  . docusate sodium  100 mg Oral BID  . enoxaparin (LOVENOX) injection  40 mg Subcutaneous Q24H  . irbesartan  150 mg Oral Daily  . levothyroxine  137 mcg Oral QAC  breakfast  . Linaclotide  145 mcg Oral Daily  . loratadine  10 mg Oral Daily  . multivitamin with minerals  1 tablet Oral Daily  . OLANZapine  5 mg Oral QHS  . pantoprazole  40 mg Oral Daily  . polyethylene glycol  17 g Oral BID  . potassium chloride SA  20 mEq Oral Daily  . saccharomyces boulardii  250 mg Oral Daily  . sodium chloride  3 mL Intravenous Q12H  . traZODone  100 mg Oral QHS   Continuous: . sodium chloride 100 mL/hr at 10/03/13 0405    Assessment/Plan: Principal Problem: 1. Hyponatremia- improving with hydration.  Lab evaluation suggestive of hypovolemia in setting of poor po intake and diuretic therapy.  May have superimposed SIADH as well.  Will continue IV fluids and monitor.   Active Problems: 2. Chronic cough- appears improved this am though subjectively she reports no change.  CT shows no acute pulmonary cause.  Suspect GI +/- psychogenic etiology.  Continue Protonix.  Will obtain Esophogram due to retained food in esophagus- ?stenosis related  to prior Nissen Fundoplication vs. Motility disorder.  This could be contributing to cough.  Consider GI consult pending Esophogram results. 3. Anxiety disorder- appears better this am.  Continue Zyprexa. 4. Chronic constipation- continue Miralax, Linzess and Colace. Will give SMOG enema X 1. 5. HYPERTENSION, BENIGN SYSTEMIC- BP adequate- Atenolol and HCTZ held. 6. Hypothyroidism- TSH pending. 7.  Anemia/Leukopenia- being followed as an outpatient by Oncology.  8. Disposition- possible discharge in 1-2 days if Na continues to improve and cough stabilizes.   LOS: 1 day   Marton Redwood 10/03/2013, 7:40 AM

## 2013-10-03 NOTE — Care Management Note (Addendum)
    Page 1 of 1   10/04/2013     10:33:49 AM   CARE MANAGEMENT NOTE 10/04/2013  Patient:  [name], [name]   Account Number:  [id number]  Date Initiated:  10/03/2013  Documentation initiated by:  Dessa Phi  Subjective/Objective Assessment:   [address] [place].BM:[id number].     Action/Plan:   FROM HOME ALONE.HAS CANE.HAS PCP,PHARMACY.   Anticipated DC Date:  10/04/2013   Anticipated DC Plan:  [place]  CM consult      Choice offered to / List presented to:             Status of service:  Completed, signed off Medicare Important Message given?   (If response is "NO", the following Medicare IM given date fields will be blank) Date Medicare IM given:   Date Additional Medicare IM given:    Discharge Disposition:  HOME/SELF CARE  Per UR Regulation:  Reviewed for med. necessity/level of care/duration of stay  If discussed at [place] of Stay Meetings, dates discussed:    Comments:  10/04/13 Katherin Ramey RN,BSN NCM [phone] D/C [place].  10/03/13 Javontae Marlette RN,BSN NCM [phone] RECOMMEND PT/OT CONS.

## 2013-10-04 DIAGNOSIS — D649 Anemia, unspecified: Secondary | ICD-10-CM | POA: Diagnosis not present

## 2013-10-04 DIAGNOSIS — K5900 Constipation, unspecified: Secondary | ICD-10-CM | POA: Diagnosis not present

## 2013-10-04 DIAGNOSIS — D72819 Decreased white blood cell count, unspecified: Secondary | ICD-10-CM | POA: Diagnosis not present

## 2013-10-04 DIAGNOSIS — E039 Hypothyroidism, unspecified: Secondary | ICD-10-CM | POA: Diagnosis not present

## 2013-10-04 DIAGNOSIS — E871 Hypo-osmolality and hyponatremia: Secondary | ICD-10-CM | POA: Diagnosis not present

## 2013-10-04 LAB — CBC
HCT: 30.8 % — ABNORMAL LOW (ref 36.0–46.0)
Hemoglobin: 10.1 g/dL — ABNORMAL LOW (ref 12.0–15.0)
MCH: 23.4 pg — ABNORMAL LOW (ref 26.0–34.0)
MCHC: 32.8 g/dL (ref 30.0–36.0)
MCV: 71.3 fL — ABNORMAL LOW (ref 78.0–100.0)
Platelets: 153 10*3/uL (ref 150–400)
RBC: 4.32 MIL/uL (ref 3.87–5.11)
RDW: 14.3 % (ref 11.5–15.5)
WBC: 2.4 10*3/uL — ABNORMAL LOW (ref 4.0–10.5)

## 2013-10-04 LAB — BASIC METABOLIC PANEL
BUN: 3 mg/dL — ABNORMAL LOW (ref 6–23)
CO2: 25 mEq/L (ref 19–32)
Calcium: 8.6 mg/dL (ref 8.4–10.5)
Chloride: 99 mEq/L (ref 96–112)
Creatinine, Ser: 0.65 mg/dL (ref 0.50–1.10)
GFR calc Af Amer: >90 mL/min (ref >90)
GFR calc non Af Amer: 82 mL/min — ABNORMAL LOW (ref >90)
Glucose, Bld: 75 mg/dL (ref 70–99)
Potassium: 3.9 mEq/L (ref 3.7–5.3)
Sodium: 133 mEq/L — ABNORMAL LOW (ref 137–147)

## 2013-10-04 MED ORDER — BENZONATATE 200 MG PO CAPS: 200.0000 mg | ORAL_CAPSULE | Freq: Three times a day (TID) | ORAL | Status: DC | PRN

## 2013-10-04 MED ORDER — BENZONATATE 100 MG PO CAPS: 200.0000 mg | ORAL_CAPSULE | Freq: Three times a day (TID) | ORAL | Status: DC | PRN

## 2013-10-04 MED ORDER — OLANZAPINE 5 MG PO TABS
5.0000 mg | ORAL_TABLET | Freq: Every day | ORAL | Status: DC
Start: 2013-10-04 — End: 2019-02-21

## 2013-10-04 MED ORDER — POLYETHYLENE GLYCOL 3350 17 G PO PACK: 17.0000 g | PACK | Freq: Two times a day (BID) | ORAL | Status: DC

## 2013-10-04 NOTE — Discharge Instructions (Addendum)
Hyponatremia  Hyponatremia is when the amount of salt (sodium) in your blood is too low. When sodium levels are low, your cells will absorb extra water and swell. The swelling happens throughout the body, but it mostly affects the brain. Severe brain swelling (cerebral edema), seizures, or coma can happen.  CAUSES   Heart, kidney, or liver problems.  Thyroid problems.  Adrenal gland problems.  Severe vomiting and diarrhea.  Certain medicines or illegal drugs.  Dehydration.  Drinking too much water.  Low-sodium diet. SYMPTOMS   Nausea and vomiting.  Confusion.  Lethargy.  Agitation.  Headache.  Twitching or shaking (seizures).  Unconsciousness.  Appetite loss.  Muscle weakness and cramping. DIAGNOSIS  Hyponatremia is identified by a simple blood test. Your caregiver will perform a history and physical exam to try to find the cause and type of hyponatremia. Other tests may be needed to measure the amount of sodium in your blood and urine. TREATMENT  Treatment will depend on the cause.   Fluids may be given through the vein (IV).  Medicines may be used to correct the sodium imbalance. If medicines are causing the problem, they will need to be adjusted.  Water or fluid intake may be restricted to restore proper balance. The speed of correcting the sodium problem is very important. If the problem is corrected too fast, nerve damage (sometimes unchangeable) can happen. HOME CARE INSTRUCTIONS   Only take medicines as directed by your caregiver. Many medicines can make hyponatremia worse. Discuss all your medicines with your caregiver.  Carefully follow any recommended diet, including any fluid restrictions.  You may be asked to repeat lab tests. Follow these directions.  Avoid alcohol and recreational drugs. SEEK MEDICAL CARE IF:   You develop worsening nausea, fatigue, headache, confusion, or weakness.  Your original hyponatremia symptoms return.  You have  problems following the recommended diet. SEEK IMMEDIATE MEDICAL CARE IF:   You have a seizure.  You faint.  You have ongoing diarrhea or vomiting. MAKE SURE YOU:   Understand these instructions.  Will watch your condition.  Will get help right away if you are not doing well or get worse. Document Released: 06/25/2002 Document Revised: 09/27/2011 Document Reviewed: 12/20/2010 [facility] Patient Information 2014 [place], [place].    Panic Attacks Panic attacks are sudden, short-livedsurges of severe anxiety, fear, or discomfort. They may occur for no reason when you are relaxed, when you are anxious, or when you are sleeping. Panic attacks may occur for a number of reasons:   Healthy people occasionally have panic attacks in extreme, life-threatening situations, such as war or natural disasters. Normal anxiety is a protective mechanism of the body that helps [place] react to danger (fight or flight response).  Panic attacks are often seen with anxiety disorders, such as panic disorder, social anxiety disorder, generalized anxiety disorder, and phobias. Anxiety disorders cause excessive or uncontrollable anxiety. They may interfere with your relationships or other life activities.  Panic attacks are sometimes seen with other mental illnesses such as depression and posttraumatic stress disorder.  Certain medical conditions, prescription medicines, and drugs of abuse can cause panic attacks. SYMPTOMS  Panic attacks start suddenly, peak within 20 minutes, and are accompanied by four or more of the following symptoms:  Pounding heart or fast heart rate (palpitations).  Sweating.  Trembling or shaking.  Shortness of breath or feeling smothered.  Feeling choked.  Chest pain or discomfort.  Nausea or strange feeling in your stomach.  Dizziness, lightheadedness, or feeling like you  will faint.  Chills or hot flushes.  Numbness or tingling in your lips or hands and feet.  Feeling  that things are not real or feeling that you are not yourself.  Fear of losing control or going crazy.  Fear of dying. Some of these symptoms can mimic serious medical conditions. For example, you may think you are having a heart attack. Although panic attacks can be very scary, they are not life threatening. DIAGNOSIS  Panic attacks are diagnosed through an assessment by your health care provider. Your health care provider will ask questions about your symptoms, such as where and when they occurred. Your health care provider will also ask about your medical history and use of alcohol and drugs, including prescription medicines. Your health care provider may order blood tests or other studies to rule out a serious medical condition. Your health care provider may refer you to a mental health professional for further evaluation. TREATMENT   Most healthy people who have one or two panic attacks in an extreme, life-threatening situation will not require treatment.  The treatment for panic attacks associated with anxiety disorders or other mental illness typically involves counseling with a mental health professional, medicine, or a combination of both. Your health care provider will help determine what treatment is best for you.  Panic attacks due to physical illness usually goes away with treatment of the illness. If prescription medicine is causing panic attacks, talk with your health care provider about stopping the medicine, decreasing the dose, or substituting another medicine.  Panic attacks due to alcohol or drug abuse goes away with abstinence. Some adults need professional help in order to stop drinking or using drugs. HOME CARE INSTRUCTIONS   Take all your medicines as prescribed.   Check with your health care provider before starting new prescription or over-the-counter medicines.  Keep all follow up appointments with your health care provider. SEEK MEDICAL CARE IF:  You are not  able to take your medicines as prescribed.  Your symptoms do not improve or get worse. SEEK IMMEDIATE MEDICAL CARE IF:   You experience panic attack symptoms that are different than your usual symptoms.  You have serious thoughts about hurting yourself or others.  You are taking medicine for panic attacks and have a serious side effect. MAKE SURE YOU:  Understand these instructions.  Will watch your condition.  Will get help right away if you are not doing well or get worse. Document Released: 07/05/2005 Document Revised: 04/25/2013 Document Reviewed: 02/16/2013 [facility] Patient Information 2014 [place].

## 2013-10-04 NOTE — Discharge Summary (Signed)
DISCHARGE SUMMARY  [name]  MR#: [id number]  DOB:01/23/1932  Date of Admission: 10/02/2013 Date of Discharge: 10/04/2013  Attending Physician:Elyanah Farino, [name]  Patient's [id number], [name] Saxon, MD  Consults:  None  Discharge Diagnoses: Principal Problem:   Hyponatremia Active Problems:   Chronic cough   Anxiety disorder   Chronic constipation HYPERTENSION, BENIGN SYSTEMIC   Hypothyroidism   Anemia   Leukopenia   Past Medical History:  CAD s/p MI  Chronic cough  Overflow incontinence  HTN  OA  Pancytopenia  Hyperthyroidism  Macular degeneration  Chronic abdominal pain  Hyperlipidemia  IGT  Fibromyalgia  GERD  Hypothyroidism  L foot neuropathy  Obesity  Depression/Anxiety  AR  Diverticulosis/diverticulitis  Colon polyp  Asthma  Esophagitis (8/14)   Past Surgical History  Appy  TAH/BSO  Chole  L TKR  s/p Nissen Fundoplication (123456 dilation (8/14)  Cystoscopy w/ dilation of bladder  Excision of urethral caruncle  Shoulder surgery  Tonsillectomy  Vaginal sling surgery   Discharge Medications:   Medication List    STOP taking these medications       atenolol 25 MG tablet  Commonly known as:  TENORMIN     hydrochlorothiazide 25 MG tablet  Commonly known as:  HYDRODIURIL      TAKE these medications       ALIGN 4 MG Caps  Take 4 mg by mouth daily.     amLODipine 10 MG tablet  Commonly known as:  NORVASC  Take 1 tablet (10 mg total) by mouth daily.     aspirin EC 81 MG tablet  Take 81 mg by mouth daily.     benzonatate 200 MG capsule  Commonly known as:  TESSALON  Take 1 capsule (200 mg total) by mouth 3 (three) times daily as needed for cough.     cholecalciferol 1000 UNITS tablet  Commonly known as:  VITAMIN D  Take 1,000 Units by mouth daily.     cloNIDine 0.1 MG tablet  Commonly known as:  CATAPRES  Take 0.1 mg by mouth 2 (two) times daily.     docusate sodium 100 MG capsule  Commonly known as:  COLACE  Take  100 mg by mouth daily.     levothyroxine 137 MCG tablet  Commonly known as:  SYNTHROID, LEVOTHROID  Take 137 mcg by mouth daily before breakfast.     LINZESS 145 MCG Caps capsule  Generic drug:  Linaclotide  Take 145 mcg by mouth daily.     loratadine 10 MG tablet  Commonly known as:  CLARITIN  Take 10 mg by mouth daily.     multivitamin with minerals Tabs tablet  Take 1 tablet by mouth daily.     nitroGLYCERIN 0.4 MG SL tablet  Commonly known as:  NITROSTAT  Place 0.4 mg under the tongue every 5 (five) minutes as needed.     OLANZapine 5 MG tablet  Commonly known as:  ZYPREXA  Take 1 tablet (5 mg total) by mouth at bedtime.     omeprazole 40 MG capsule  Commonly known as:  PRILOSEC  Take 40 mg by mouth daily. Take one tablet a day     polyethylene glycol packet  Commonly known as:  MIRALAX  Take 17 g by mouth 2 (two) times daily.     potassium chloride SA 20 MEQ tablet  Commonly known as:  K-DUR,KLOR-CON  Take 20 mEq by mouth daily.     telmisartan 80 MG tablet  Commonly known as:  MICARDIS  Take 1 tablet (80 mg total) by mouth daily.     traZODone 100 MG tablet  Commonly known as:  DESYREL  Take 100 mg by mouth at bedtime. Take one hour before bedtime        Hospital Procedures: Dg Chest 2 View  10/02/2013   CLINICAL DATA:  Cough and shortness of breath.  EXAM: CHEST  2 VIEW  COMPARISON:  CT chest 11/06/2012 and chest radiograph 11/12/ 2010  FINDINGS: Stable mild cardiomegaly. Stable tortuous thoracic aorta contour. Pulmonary vascularity is normal. No airspace disease, effusion, or pneumothorax. Typical degenerative changes of the thoracic spine. Degenerative changes of both shoulders. Visualized bowel gas pattern is normal.  IMPRESSION: Stable mild cardiomegaly.  No acute cardiopulmonary disease.  Degenerative changes of both shoulders.   Electronically Signed   By: Curlene Dolphin M.D.   On: 10/02/2013 15:02   Ct Chest W Contrast  10/02/2013   CLINICAL DATA:   Chronic cough.  EXAM: CT CHEST WITH CONTRAST  TECHNIQUE: Multidetector CT imaging of the chest was performed during intravenous contrast administration.  CONTRAST:  100 mL OMNIPAQUE IOHEXOL 300 MG/ML  SOLN  COMPARISON:  CT chest 04/21/20146 and 04/24/2006. Plain film of the chest 10/02/2013 at 14:53 and 18:15 hr  FINDINGS: There is no axillary, hilar or mediastinal lymphadenopathy. Calcific coronary and aortic atherosclerosis is noted. No pleural or pericardial effusion. Heart size is normal. There is food material throughout almost the entirety of the esophagus. The esophagus is not dilated. The patient is status post Nissen fundoplication. A 0.4 cm right apical nodule on image 7 is unchanged since 2007. Mild, chronic scarring in the bases is also stable in appearance. The lungs are otherwise unremarkable. Incidentally imaged upper abdomen demonstrates postoperative change of cholecystectomy. No lytic or sclerotic bony lesion is seen. Thoracic spondylosis is noted.  IMPRESSION: No acute abnormality.  Food within the esophagus could be due to poor motility or stenosis from fundoplication. No mass is identified.   Electronically Signed   By: Inge Rise M.D.   On: 10/02/2013 23:56   Dg Esophagus  10/03/2013   CLINICAL DATA:  Dysphagia.  EXAM: ESOPHOGRAM/BARIUM SWALLOW  TECHNIQUE: Single contrast examination was performed using  thin barium  FLUOROSCOPY TIME:  1 min, 29 seconds  COMPARISON:  DG ABD 2 VIEWS dated 10/02/2013; CT CHEST W/CM dated 10/02/2013  FINDINGS: The patient has not able to stand or turn well. Single contrast exam was performed with patient laying at a 45 degrees angle, using frontal projection.  The pharyngeal phase of swallowing was only assessed on the frontal projection but appeared relatively normal.  Mildly dilated distal esophagus. Smooth distal esophageal tapering. A 13 mm barium tablet impacted in this area tapering. Maximum distal esophageal caliber was about 6 mm.  There was  disruption of the primary peristaltic waves in the esophagus on 4 out of 4 swallows.  IMPRESSION: 1. Mildly dilated distal esophagus with smooth distal tapering. Some of this tapering is probably due to the patient's fundoplication, although along with the disruption of the peristaltic waves, achalasia could have a very similar appearance. I do not demonstrate mucosal irregularity in the region of narrowing to suggest distal esophageal malignancy.   Electronically Signed   By: Sherryl Barters M.D.   On: 10/03/2013 16:15   Dg Chest Port 1 View  10/02/2013   CLINICAL DATA:  Cough, shortness of breath, weakness  EXAM: PORTABLE CHEST - 1 VIEW  COMPARISON:  DG CHEST 2 VIEW dated 10/02/2013; DG ESOPHAGUS-BA  SW dated 02/16/2013; CT ANGIO CHEST W/CM &/OR WO/CM dated 11/06/2012  FINDINGS: There is gaseous distention of the stomach. There is elevation of the left diaphragm. Cardiac silhouette within normal limits. Vascular pattern normal. Mild density left lung base appears most consistent with atelectasis. Mild right base opacity appears similarly atelectatic, with depression of the horizontal fissure. Moderate bilateral shoulder arthritis is stable.  IMPRESSION: Bibasilar opacities appear most consistent with atelectasis particularly on the left due to diaphragmatic elevation with gaseous distention of the stomach. There is atelectasis in the right lower lobe as well but the possibility of superimposed pneumonia is not entirely excluded. If symptoms persist consider follow-up radiographs.   Electronically Signed   By: Skipper Cliche M.D.   On: 10/02/2013 18:43   Dg Abd 2 Views  10/02/2013   CLINICAL DATA:  Constipation  EXAM: ABDOMEN - 2 VIEW  COMPARISON:  Acute abdominal November 05 2012  FINDINGS: Probable atelectasis at the right lung base, seen on the supine view projection of the liver. The bowel gas pattern is nonobstructive. Typical amount of stool in the colon noted. No evidence of bowel obstruction. Negative for  free intraperitoneal air. No urinary tract stone identified. Degenerative changes of the lower lumbar spine. Cholecystectomy clips.  IMPRESSION: Nonobstructive bowel gas pattern. No significant stool burden visualized.   Electronically Signed   By: Curlene Dolphin M.D.   On: 10/02/2013 16:16    History of Present Illness: Ms. Howes is a 80 year old American female with a history of chronic cough, prior Nissen fundoplication, generalized anxiety/depression who presented to the emergency department with a complaint of persistent cough. Patient has a long-standing history of chronic cough which improved dramatically after her Nissen fundoplication and treatment of anxiety. She had done well until about 4 weeks ago when she developed a recurrent cough. It is important to note that she's had a long-standing history of somatic symptoms which improved significantly when she started seeing a psychiatrist. She states that she stopped taking her Zyprexa because she felt like it was making her more constipated and causing abdominal bloating. Concurrent with this she developed recurrence of her cough. She was seen in our office and prescribed a Z-Pak which was not helpful. I urged her to resume Zyprexa or to discuss alternative with her psychiatrist. Rolan Lipa was prescribed for her constipation. Due to persistent cough and constiapation she presented to the emergency department today where chest x-ray shows no acute cardiopulmonary disease. Laboratory evaluation is significant for a sodium of 118 (baseline 135 in 11/14). She denies any weight loss, fever/chills/sweats. She is having coughing paroxysms which cause her to have difficulty breathing. She is not using an inhaler at home. Given her profound hyponatremia we were called for admission.   Hospital Course: He was admitted to a telemetry bed. She was hydrated with normal saline for hyponatremia. HCTZ was discontinued. Laboratory evaluation was consistent with final  depletion with possible superimposed SIADH. With hydration and medication changes her sodium gradually improved to 133 on the day of discharge.  For chronic cough she underwent a CT of the chest which showed no acute pulmonary findings. It did show some retained food within the esophagus. This was evaluated further with a diagnostic esophagram which showed mildly dilated distal esophagus with smooth tapering along with disruption of the normal peristaltic waves. This is likely secondary to her Nissen fundoplication though achalasia may be a consideration.  Is most likely that her chronic cough is related to this given the prior improvement following  her Nissen fundoplication. At this point, her cough has improved some and we have decided to give this more time and to consider outpatient GI evaluation if persistent. She will be provided Tessalon Perles to use as needed for cough. Her atenolol was discontinued in the event that could be contributing to her cough.  Emphasized importance of pushing fluids to prevent dehydration.  For her chronic constipation she was provided a smog enema and an aggressive bowel regimen consisting of Colace twice a day, Linzess daily, and MiraLAX twice a day. With this she had 4 bowel movements yesterday and reports improvement in her abdominal bloating.  For her anxiety she was restarted on Zyprexa which she had done well with for many months. I discussed this with Arlington Calix, her psychologist who agrees that she needs to be on this medication. I emphasized the patient and the importance of continuing is to help with her somatic symptoms. She is concerned it is contributing to her constipation; however, with aggressive bowel regimen I am hopeful that she will be able to continue this.  She does have chronic leukopenia and anemia which is being followed closely by oncology as an outpatient. This has remained stable with slight drop in her white blood count and hemoglobin with  hydration consistent with dilution.  At this point the patient is medically stable for discharge with close outpatient followup to continue management of her chronic cough, chronic constipation, and anxiety. Her hyponatremia has resolved. I did discuss treatment plan with her son as well who is supportive.  Day of Discharge Exam BP 118/60  Pulse 75  Temp(Src) 98.4 F (36.9 C) (Oral)  Resp 16  Ht 5\' 3"  (1.6 m)  Wt 70.7 kg (155 lb 13.8 oz)  BMI 27.62 kg/m2  SpO2 95%  Physical Exam: General appearance: alert and no distress; anxious appearing Eyes: no scleral icterus Throat: oropharynx moist without erythema Resp: clear to auscultation bilaterally Cardio: regular rate and rhythm GI: soft, non-tender; bowel sounds normal; no masses,  no organomegaly Extremities: no clubbing, cyanosis or edema  Discharge Labs:  Recent Labs  10/03/13 0540 10/04/13 0405  NA 126* 133*  K 3.6* 3.9  CL 88* 99  CO2 26 25  GLUCOSE 79 75  BUN 5* 3*  CREATININE 0.70 0.65  CALCIUM 9.1 8.6    Recent Labs  10/02/13 1545  AST 67*  ALT 32  ALKPHOS 77  BILITOT 0.5  PROT 6.8  ALBUMIN 4.0    Recent Labs  10/02/13 1545 10/03/13 0540 10/04/13 0405  WBC 2.6* 2.2* 2.4*  NEUTROABS 1.2*  --   --   HGB 11.9* 10.7* 10.1*  HCT 34.0* 32.3* 30.8*  MCV 68.4* 70.2* 71.3*  PLT 174 155 153   Lab Results  Component Value Date   INR 0.9 07/07/2007    Recent Labs  10/02/13 1545  TROPONINI <0.30    Recent Labs  10/03/13 0521  TSH 1.576   Urine sodium 24 Urine osmolality 181 Serum osmolality 244 Lactic acid 0.8  Discharge instructions:     Discharge Orders   Future Appointments Provider Department Dept Phone   11/30/2013 1:30 PM [facility] [phone]   11/30/2013 2:00 PM Heath Lark, MD [facility] [phone]   Future Orders Complete By Expires   Diet general  As directed    Discharge instructions  As  directed    Comments:     Stop HCTZ and Atenolol.  Restart Zyprexa 5mg  at bedtime.  Push fluids.  Call if cough worsens significantly.  Follow-up with Dr. Henrene Pastor regarding swallowing and with Arlington Calix and Noemi Chapel regarding anxiety.   Increase activity slowly  As directed       Disposition: To home  Follow-up Appts: Follow-up with Dr. Brigitte Pulse at [facility] within one week. Our office will call to arrange the followup appointment.  Condition on Discharge: Improved  Tests Needing Follow-up: None  Time with Discharge activities: 40 minutes  Signed: Marton Redwood 10/04/2013, 7:50 AM

## 2013-10-11 DIAGNOSIS — F329 Major depressive disorder, single episode, unspecified: Secondary | ICD-10-CM | POA: Diagnosis not present

## 2013-10-11 DIAGNOSIS — K5909 Other constipation: Secondary | ICD-10-CM | POA: Diagnosis not present

## 2013-10-11 DIAGNOSIS — R1310 Dysphagia, unspecified: Secondary | ICD-10-CM | POA: Diagnosis not present

## 2013-10-11 DIAGNOSIS — R059 Cough, unspecified: Secondary | ICD-10-CM | POA: Diagnosis not present

## 2013-10-11 DIAGNOSIS — I10 Essential (primary) hypertension: Secondary | ICD-10-CM | POA: Diagnosis not present

## 2013-10-11 DIAGNOSIS — R05 Cough: Secondary | ICD-10-CM | POA: Diagnosis not present

## 2013-10-11 DIAGNOSIS — R7301 Impaired fasting glucose: Secondary | ICD-10-CM | POA: Diagnosis not present

## 2013-10-11 DIAGNOSIS — E871 Hypo-osmolality and hyponatremia: Secondary | ICD-10-CM | POA: Diagnosis not present

## 2013-10-15 DIAGNOSIS — K222 Esophageal obstruction: Secondary | ICD-10-CM | POA: Diagnosis not present

## 2013-10-15 DIAGNOSIS — K5900 Constipation, unspecified: Secondary | ICD-10-CM | POA: Diagnosis not present

## 2013-10-23 DIAGNOSIS — H2510 Age-related nuclear cataract, unspecified eye: Secondary | ICD-10-CM | POA: Diagnosis not present

## 2013-10-30 ENCOUNTER — Other Ambulatory Visit: Payer: Self-pay | Admitting: *Deleted

## 2013-10-30 MED ORDER — TELMISARTAN 80 MG PO TABS: 80.0000 mg | ORAL_TABLET | Freq: Every day | ORAL | Status: DC

## 2013-11-02 DIAGNOSIS — R059 Cough, unspecified: Secondary | ICD-10-CM | POA: Diagnosis not present

## 2013-11-02 DIAGNOSIS — K5909 Other constipation: Secondary | ICD-10-CM | POA: Diagnosis not present

## 2013-11-02 DIAGNOSIS — R7301 Impaired fasting glucose: Secondary | ICD-10-CM | POA: Diagnosis not present

## 2013-11-02 DIAGNOSIS — R1310 Dysphagia, unspecified: Secondary | ICD-10-CM | POA: Diagnosis not present

## 2013-11-02 DIAGNOSIS — F329 Major depressive disorder, single episode, unspecified: Secondary | ICD-10-CM | POA: Diagnosis not present

## 2013-11-02 DIAGNOSIS — R05 Cough: Secondary | ICD-10-CM | POA: Diagnosis not present

## 2013-11-02 DIAGNOSIS — E871 Hypo-osmolality and hyponatremia: Secondary | ICD-10-CM | POA: Diagnosis not present

## 2013-11-07 DIAGNOSIS — R1011 Right upper quadrant pain: Secondary | ICD-10-CM | POA: Diagnosis not present

## 2013-11-07 DIAGNOSIS — E871 Hypo-osmolality and hyponatremia: Secondary | ICD-10-CM | POA: Diagnosis not present

## 2013-11-07 DIAGNOSIS — R609 Edema, unspecified: Secondary | ICD-10-CM | POA: Diagnosis not present

## 2013-11-07 DIAGNOSIS — E039 Hypothyroidism, unspecified: Secondary | ICD-10-CM | POA: Diagnosis not present

## 2013-11-07 DIAGNOSIS — F329 Major depressive disorder, single episode, unspecified: Secondary | ICD-10-CM | POA: Diagnosis not present

## 2013-11-07 DIAGNOSIS — R059 Cough, unspecified: Secondary | ICD-10-CM | POA: Diagnosis not present

## 2013-11-07 DIAGNOSIS — K5909 Other constipation: Secondary | ICD-10-CM | POA: Diagnosis not present

## 2013-11-07 DIAGNOSIS — R05 Cough: Secondary | ICD-10-CM | POA: Diagnosis not present

## 2013-11-07 DIAGNOSIS — R1310 Dysphagia, unspecified: Secondary | ICD-10-CM | POA: Diagnosis not present

## 2013-11-14 ENCOUNTER — Encounter: Payer: Self-pay | Admitting: Cardiovascular Disease

## 2013-11-14 ENCOUNTER — Ambulatory Visit (INDEPENDENT_AMBULATORY_CARE_PROVIDER_SITE_OTHER): Payer: Medicare Other | Admitting: Cardiovascular Disease

## 2013-11-14 VITALS — BP 128/75 | HR 78 | Ht 63.0 in | Wt 154.1 lb

## 2013-11-14 DIAGNOSIS — I10 Essential (primary) hypertension: Secondary | ICD-10-CM

## 2013-11-14 DIAGNOSIS — R0789 Other chest pain: Secondary | ICD-10-CM | POA: Diagnosis not present

## 2013-11-14 DIAGNOSIS — E785 Hyperlipidemia, unspecified: Secondary | ICD-10-CM

## 2013-11-14 DIAGNOSIS — I2510 Atherosclerotic heart disease of native coronary artery without angina pectoris: Secondary | ICD-10-CM | POA: Diagnosis not present

## 2013-11-14 IMAGING — CT CT ANGIO CHEST
1 of 2 series · 19 of 32 positions shown · IV contrast ([ID] [id number])
Comparison: 11/05/2012 radiograph, 04/24/2006 CT

CLINICAL DATA: Shortness of breath, elevated D-dimer

CT ANGIOGRAPHY CHEST
TECHNIQUE: Multidetector CT imaging of the chest using the
standard protocol during bolus administration of intravenous
contrast. Multiplanar reconstructed images including MIPs were
obtained and reviewed to evaluate the vascular anatomy.
Contrast: 100mL OMNIPAQUE IOHEXOL 350 MG/ML SOLN

[Series 5: thins for pacs · axial · 0.51mm/px · z∈[-290,-92]mm · 19 of 222 slices shown]
[im 12/222  lung]
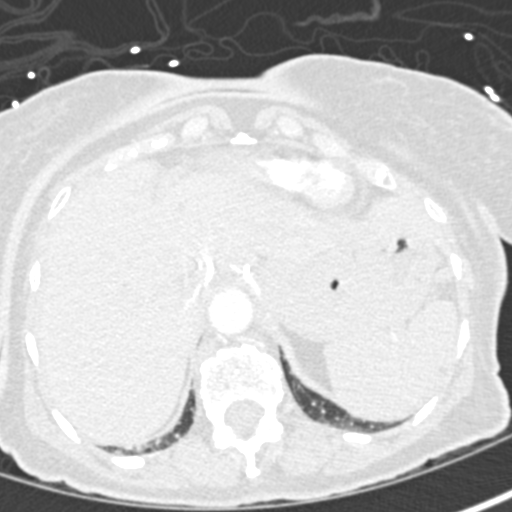
[im 23/222  mediastinal]
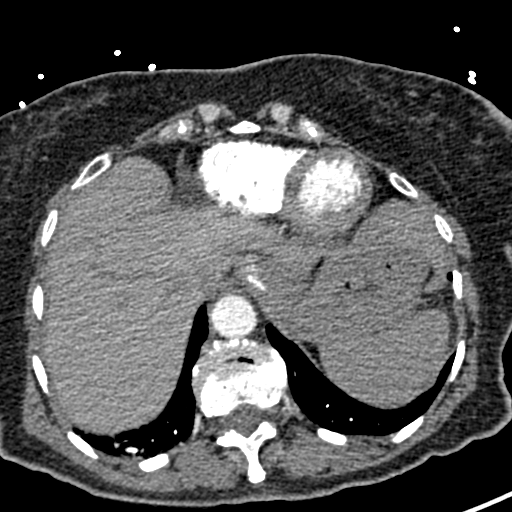
[im 34/222  lung]
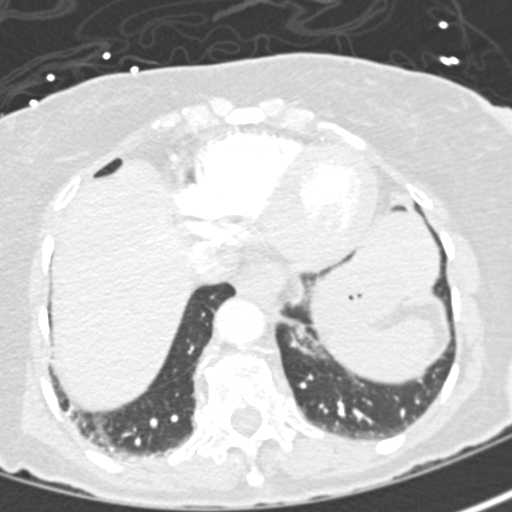
[im 56/222  mediastinal]
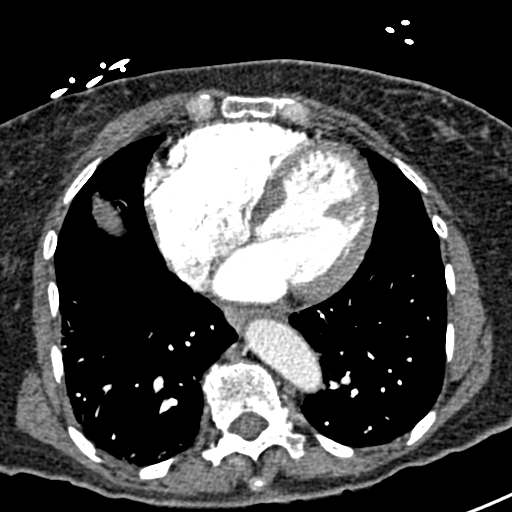
[im 67/222  lung]
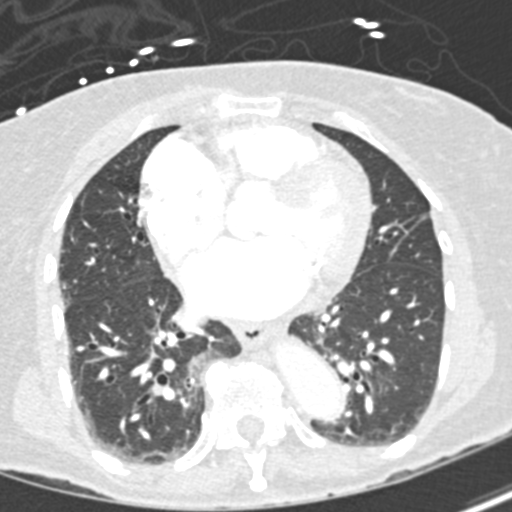
[im 74/222  mediastinal]
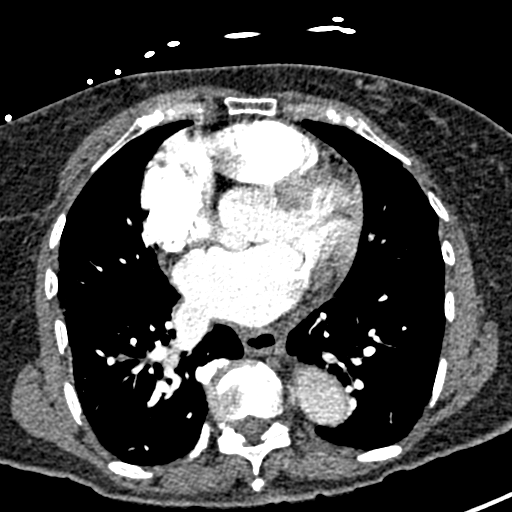
[im 78/222  lung]
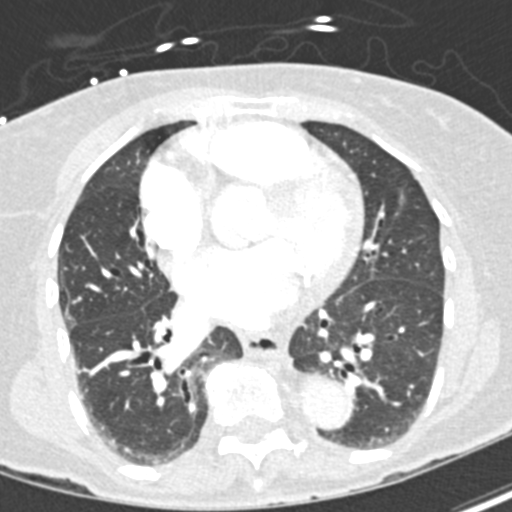
[im 89/222  mediastinal]
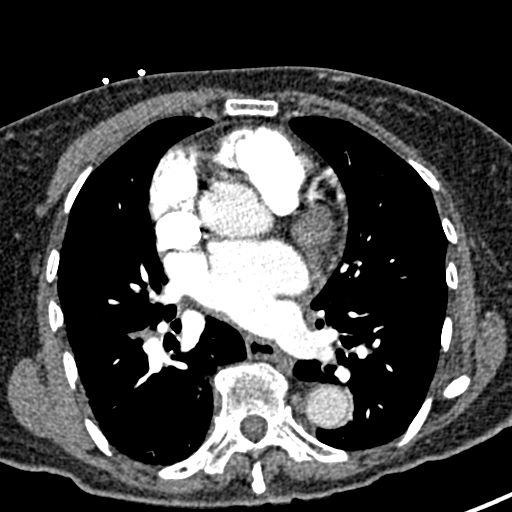
[im 100/222  lung]
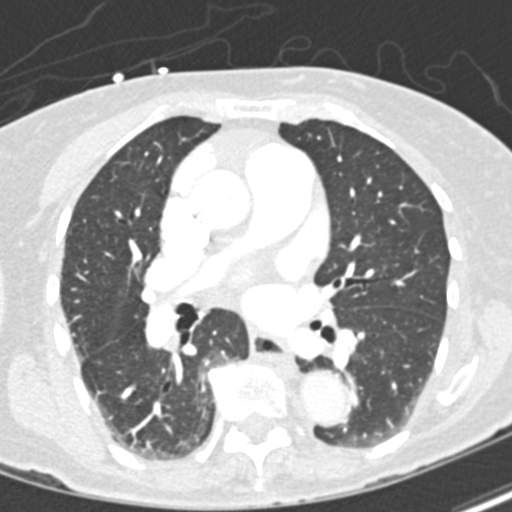
[im 111/222  mediastinal]
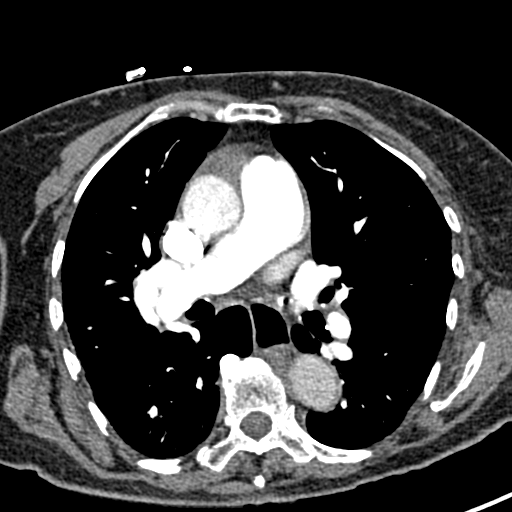
[im 122/222  lung]
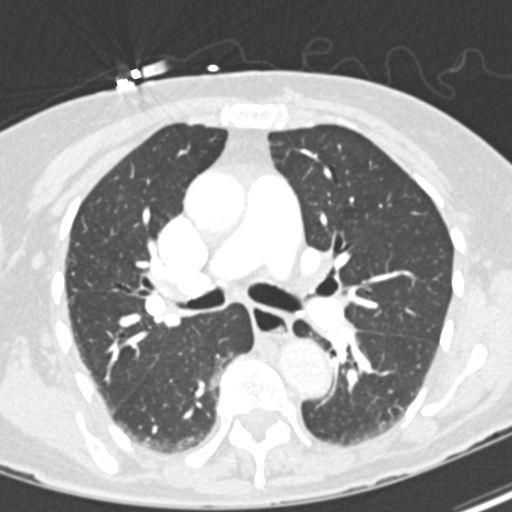
[im 133/222  mediastinal]
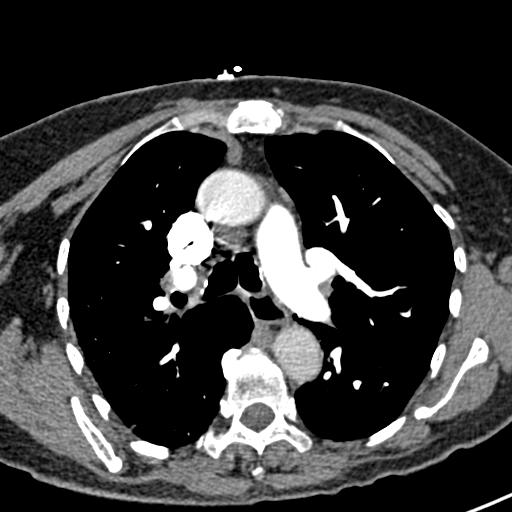
[im 144/222  lung]
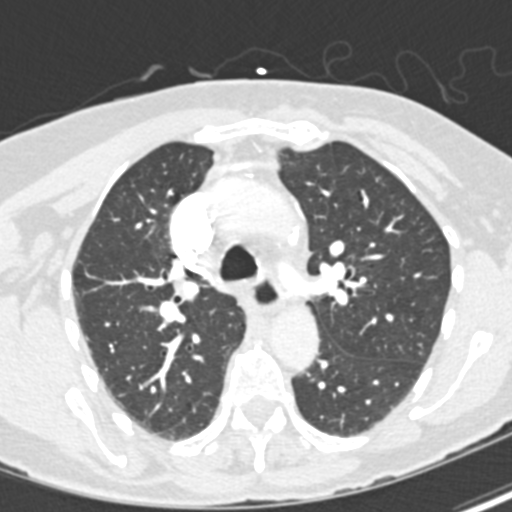
[im 148/222  mediastinal]
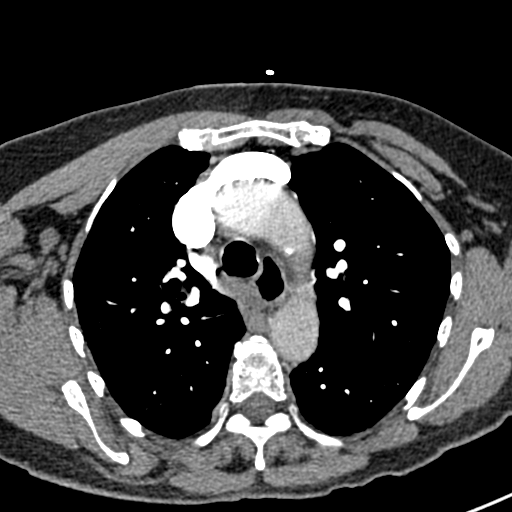
[im 155/222  lung]
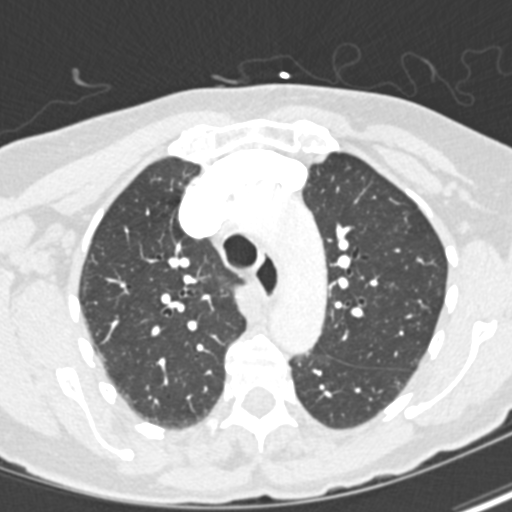
[im 166/222  mediastinal]
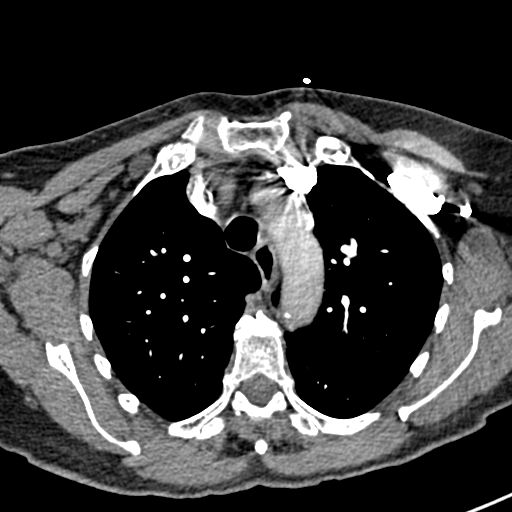
[im 188/222  lung]
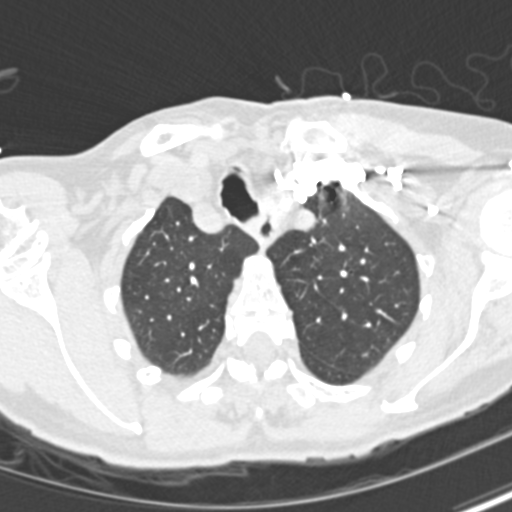
[im 199/222  mediastinal]
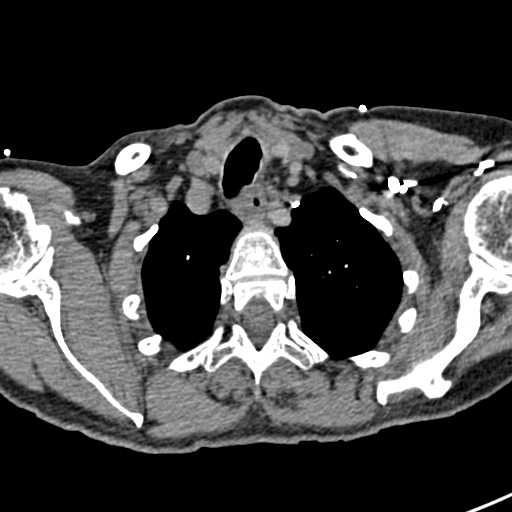
[im 210/222  lung]
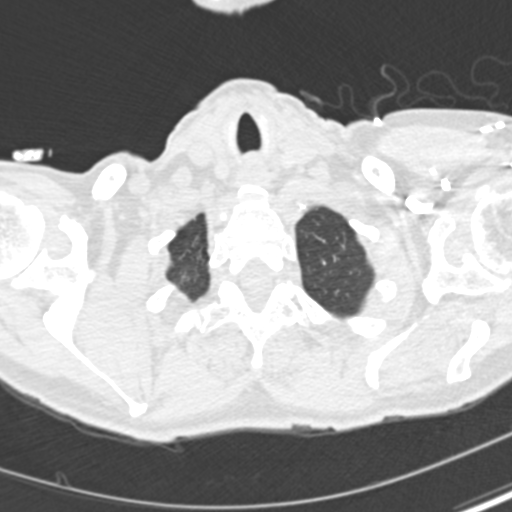

[19 of 32 positions shown; findings below may reference images not displayed]

FINDINGS: Pulmonary arterial branches are patent.

Normal caliber aorta with scattered atherosclerotic disease.

Mildly enlarged heart size.  Coronary artery calcification.

No pleural or pericardial effusion.

No intrathoracic lymphadenopathy.

There are a few small nodules.  As index, 5 mm posterior subpleural
nodule right upper lobe, measured 4 mm previously.  3 mm right
upper lobe nodule on image 42 series 5, increased in size in the
interval.  Ground-glass opacity left lung base measures 3.3 x
cm, has a more linear configuration on the sagittal projection

No pneumothorax.  Central airways are patent.

Limited images through the upper abdomen show no acute finding.

Multilevel degenerative changes.  No acute osseous finding.
IMPRESSION: No pulmonary embolism.

Pulmonary nodules measuring up to 5 mm, have mildly increased in
size from 3334, nonspecific.  Recommend [DATE]-month follow-up.

Nonspecific ground-glass opacity left lung base.  Attention on
follow-up.

## 2013-11-14 NOTE — Patient Instructions (Signed)
Your physician recommends that you continue on your current medications as directed. Please refer to the Current Medication list given to you today.  Your physician wants you to follow-up in: 6 months with Dr. Acie Fredrickson and fasting lab work.  You will receive a reminder letter in the mail two months in advance. If you don't receive a letter, please call our office to schedule the follow-up appointment.  Your physician recommends that you return for lab work in: 6 months with your appointment - you will need to fast for this appointment (nothing to eat or drink after midnight except water)

## 2013-11-14 NOTE — Assessment & Plan Note (Signed)
Very mild CAD

## 2013-11-14 NOTE — Progress Notes (Signed)
[name] [facility] Date of Birth  [birth year] [name] / [facility] [address]     [place], [place]  [address] [phone]  Fax  [phone]  Problems: 1. Minimal coronary artery disease 2. Hyperlipidemia 3. Chronic cough 4. Hypothyroidism 5. Status post Nissen fundoplication for acid reflux 6. Hypertension  History of Present Illness:  [name] is an elderly female with a history of chest pain with minimal coronary artery disease. She has a history of hyperlipidemia chronic cough she status post Nissen fundoplication for acid reflux. She has a history of hypertension.  She's continued to have some episodes of chest discomfort. These are fairly atypical.  Dec. 4, 2013: She has been having upper abdominal / lower chest discomfort pain.  The pain is eased with NTG.  She does not think the symptoms are due to soft reflux. She stopped her proton pump inhibitor last year. She was  concerned about side effects including osteoporosis.  Sept. 12, 2014:  She is doing well from a cardiac standpoint.  She continues to have problems with GERD. She has had a nissan fundiplication - helped the cough but now she had recurrent GERD.    She has been gaining weight.  Has some numbness on the lateral aspect of her right legs.   November 14, 2013:  She was recently hospitalized for generalized weakness and coughing.  She was found to have hyponatremia.  She has been feeling well.  She bought some compression hose but they were too long ( ended at the bend of her knee) and she could not wear them.  She is going to go back to the supply store to get a shorter pair.   She denies any CP or dyspnea.     Current Outpatient Prescriptions on File Prior to Visit  Medication Sig Dispense Refill  . amLODipine (NORVASC) 10 MG tablet Take 1 tablet (10 mg total) by mouth daily.  90 tablet  3  . aspirin EC 81 MG tablet Take 81 mg by mouth daily.      . cholecalciferol  (VITAMIN D) 1000 UNITS tablet Take 1,000 Units by mouth daily.      . cloNIDine (CATAPRES) 0.1 MG tablet Take 0.1 mg by mouth 2 (two) times daily.       Marland Kitchen docusate sodium (COLACE) 100 MG capsule Take 100 mg by mouth daily.      Marland Kitchen levothyroxine (SYNTHROID, LEVOTHROID) 137 MCG tablet Take 137 mcg by mouth daily before breakfast.      . loratadine (CLARITIN) 10 MG tablet Take 10 mg by mouth daily.      . Multiple Vitamin (MULTIVITAMIN WITH MINERALS) TABS Take 1 tablet by mouth daily.      . nitroGLYCERIN (NITROSTAT) 0.4 MG SL tablet Place 0.4 mg under the tongue every 5 (five) minutes as needed.        Marland Kitchen OLANZapine (ZYPREXA) 5 MG tablet Take 1 tablet (5 mg total) by mouth at bedtime.  30 tablet  6  . polyethylene glycol (MIRALAX) packet Take 17 g by mouth 2 (two) times daily.  60 packet  6  . potassium chloride SA (K-DUR,KLOR-CON) 20 MEQ tablet Take 20 mEq by mouth daily.      . Probiotic Product (ALIGN) 4 MG CAPS Take 4 mg by mouth daily.      Marland Kitchen telmisartan (MICARDIS) 80 MG tablet Take 1 tablet (80 mg total) by mouth daily.  90 tablet  0  . traZODone (DESYREL) 100  MG tablet Take 100 mg by mouth at bedtime. Take one hour before bedtime       No current facility-administered medications on file prior to visit.    Allergies  Allergen Reactions  . Cholestatin   . Doxycycline     REACTION: itching/rash  . Prednisone     REACTION: itching  . Statins Other (See Comments)    Muscle hurts    Past Medical History  Diagnosis Date  . Pneumonia   . Hypothyroidism   . Hypertension   . Hyperlipemia   . CAD (coronary artery disease)   . GERD (gastroesophageal reflux disease)   . Leukocytopenia   . Hypothyroid 09/11/2011  . Osteoporosis 09/11/2011  . Myocardial infarction 09/11/2011    Per pt in 2004  . Arthritis   . Liver mass 09/11/2011    Per report of pt  . Kidney mass 09/11/2011    Per report of pt  . Esophageal abnormality 09/11/2011    Surgically treated per pt.  . Hypercholesterolemia  09/11/2011  . Headache(784.0)   . Cataracts, bilateral 09/11/2011    Per report of pt.  . Insect bite of ankle, left 09/11/2011    back of ankle  . Thigh cramp 09/11/2011    Left thigh x 3 nights since starting unspecified psychotropic medication  . Anemia 06/02/2012  . Mental health disorder   . Anxiety     Past Surgical History  Procedure Laterality Date  . US echocardiography  06-30-2005    Est EF 55-60%  . Cardiovascular stress test  08-15-2007    EF 59%  . Hysterotomy    . Appendectomy    . Cholecystectomy open    . Tonsillectomy    . Knee surgery  09/11/2011  . Gallbladder surgery  09/11/2011  . Abdominal hysterectomy    . Shoulder surgery  09/11/2011    Right side    History  Smoking status  . Never Smoker   Smokeless tobacco  . Never Used    History  Alcohol Use No    Family History  Problem Relation Age of Onset  . Hypertension Sister   . Hypertension Brother     Reviw of Systems:  Reviewed in the HPI.  All other systems are negative.  Physical Exam: BP 128/75  Pulse 78  Ht 5\' 3"  (1.6 m)  Wt 154 lb 1.9 oz (69.908 kg)  BMI 27.31 kg/m2 The patient is alert and oriented x 3.  The mood and affect are normal.   Skin: warm and dry.  Color is normal.    HEENT:   the sclera are nonicteric.  The mucous membranes are moist.  The carotids are 2+ without bruits.  There is no thyromegaly.  There is no JVD.    Lungs: clear.  The chest wall is non tender.    Heart: regular rate with a normal S1 and S2.  There are no murmurs, gallops, or rubs. The PMI is not displaced.     Abdomen: good bowel sounds.  There is no guarding or rebound.  There is no hepatosplenomegaly or tenderness.  There are no masses.   Extremities:  no clubbing, cyanosis, or edema.  The legs are without rashes.  The distal pulses are intact.   Neuro:  Cranial nerves II - XII are intact.  Motor and sensory functions are intact.    The gait is normal.  EKG: November 14, 2013 :  NSR at 23.   Normal sinus rhythm at 78  Assessment / Plan:

## 2013-11-14 NOTE — Assessment & Plan Note (Signed)
Her hydrochlorothiazide was stopped because of hyponatremia. Despite this she seems to be doing very well. We will keep her on her same medications. She seems to be tolerating very well.  I'll see her again in 6 months for followup visit.

## 2013-11-26 DIAGNOSIS — Z86010 Personal history of colonic polyps: Secondary | ICD-10-CM | POA: Diagnosis not present

## 2013-11-26 DIAGNOSIS — K222 Esophageal obstruction: Secondary | ICD-10-CM | POA: Diagnosis not present

## 2013-11-29 DIAGNOSIS — D759 Disease of blood and blood-forming organs, unspecified: Secondary | ICD-10-CM | POA: Diagnosis not present

## 2013-11-30 ENCOUNTER — Encounter: Payer: Self-pay | Admitting: Hematology and Oncology

## 2013-11-30 ENCOUNTER — Ambulatory Visit (HOSPITAL_BASED_OUTPATIENT_CLINIC_OR_DEPARTMENT_OTHER): Payer: Medicare Other | Admitting: Hematology and Oncology

## 2013-11-30 ENCOUNTER — Other Ambulatory Visit (HOSPITAL_BASED_OUTPATIENT_CLINIC_OR_DEPARTMENT_OTHER): Payer: Medicare Other

## 2013-11-30 VITALS — BP 154/76 | HR 66 | Temp 97.3°F | Resp 17 | Ht 63.0 in | Wt 157.2 lb

## 2013-11-30 DIAGNOSIS — D509 Iron deficiency anemia, unspecified: Secondary | ICD-10-CM

## 2013-11-30 DIAGNOSIS — D72819 Decreased white blood cell count, unspecified: Secondary | ICD-10-CM

## 2013-11-30 DIAGNOSIS — R634 Abnormal weight loss: Secondary | ICD-10-CM | POA: Diagnosis not present

## 2013-11-30 DIAGNOSIS — D649 Anemia, unspecified: Secondary | ICD-10-CM

## 2013-11-30 LAB — CBC WITH DIFFERENTIAL/PLATELET
BASO%: 0.3 % (ref 0.0–2.0)
Basophils Absolute: 0 10*3/uL (ref 0.0–0.1)
EOS%: 3.2 % (ref 0.0–7.0)
Eosinophils Absolute: 0.1 10*3/uL (ref 0.0–0.5)
HCT: 34.5 % — ABNORMAL LOW (ref 34.8–46.6)
HGB: 11.2 g/dL — ABNORMAL LOW (ref 11.6–15.9)
LYMPH%: 47.6 % (ref 14.0–49.7)
MCH: 23.8 pg — ABNORMAL LOW (ref 25.1–34.0)
MCHC: 32.5 g/dL (ref 31.5–36.0)
MCV: 73.4 fL — ABNORMAL LOW (ref 79.5–101.0)
MONO#: 0.4 10*3/uL (ref 0.1–0.9)
MONO%: 11.5 % (ref 0.0–14.0)
NEUT#: 1.2 10*3/uL — ABNORMAL LOW (ref 1.5–6.5)
NEUT%: 37.4 % — ABNORMAL LOW (ref 38.4–76.8)
Platelets: 182 10*3/uL (ref 145–400)
RBC: 4.7 10*6/uL (ref 3.70–5.45)
RDW: 16.2 % — ABNORMAL HIGH (ref 11.2–14.5)
WBC: 3.1 10*3/uL — ABNORMAL LOW (ref 3.9–10.3)
lymph#: 1.5 10*3/uL (ref 0.9–3.3)

## 2013-11-30 LAB — COMPREHENSIVE METABOLIC PANEL (CC13)
ALT: 12 U/L (ref 0–55)
AST: 26 U/L (ref 5–34)
Albumin: 3.8 g/dL (ref 3.5–5.0)
Alkaline Phosphatase: 110 U/L (ref 40–150)
Anion Gap: 10 mEq/L (ref 3–11)
BUN: 11.7 mg/dL (ref 7.0–26.0)
CO2: 25 mEq/L (ref 22–29)
Calcium: 10 mg/dL (ref 8.4–10.4)
Chloride: 104 mEq/L (ref 98–109)
Creatinine: 0.8 mg/dL (ref 0.6–1.1)
Glucose: 72 mg/dl (ref 70–140)
Potassium: 3.8 mEq/L (ref 3.5–5.1)
Sodium: 138 mEq/L (ref 136–145)
Total Bilirubin: 0.36 mg/dL (ref 0.20–1.20)
Total Protein: 7 g/dL (ref 6.4–8.3)

## 2013-11-30 LAB — CHCC SMEAR

## 2013-11-30 NOTE — Progress Notes (Signed)
[facility] OFFICE PROGRESS NOTE  Marton Redwood, MD DIAGNOSIS:  Anemia and leukopenia  SUMMARY OF HEMATOLOGIC HISTORY: This patient has leukopenia and anemia for a while. She had a bone marrow aspirate and biopsy done in 2007 which was normal. 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 T-cell rearrangement study ruled out LGL. The cause of leukopenia is likely related to African American heritage. INTERVAL HISTORY: [name] 80 y.o. female returns for further followup. She was hospitalized not long ago due to hyponatremia, subsequently improved. She has lost some weight since she was here. Denies any recent infection. The patient denies any recent signs or symptoms of bleeding such as spontaneous epistaxis, hematuria or hematochezia. She is not symptomatic from anemia. I have reviewed the past medical history, past surgical history, social history and family history with the patient and they are unchanged from previous note.  ALLERGIES:  is allergic to cholestatin; doxycycline; prednisone; and statins.  MEDICATIONS:  Current Outpatient Prescriptions  Medication Sig Dispense Refill  . amLODipine (NORVASC) 10 MG tablet Take 1 tablet (10 mg total) by mouth daily.  90 tablet  3  . aspirin EC 81 MG tablet Take 81 mg by mouth daily.      . cholecalciferol (VITAMIN D) 1000 UNITS tablet Take 1,000 Units by mouth daily.      . cloNIDine (CATAPRES) 0.1 MG tablet Take 0.1 mg by mouth 2 (two) times daily.       Marland Kitchen docusate sodium (COLACE) 100 MG capsule Take 100 mg by mouth daily.      Marland Kitchen levothyroxine (SYNTHROID, LEVOTHROID) 137 MCG tablet Take 137 mcg by mouth daily before breakfast.      . LINZESS 145 MCG CAPS capsule       . loratadine (CLARITIN) 10 MG tablet Take 10 mg by mouth daily.      . Multiple Vitamin (MULTIVITAMIN WITH MINERALS) TABS Take 1 tablet by mouth daily.      . nitroGLYCERIN  (NITROSTAT) 0.4 MG SL tablet Place 0.4 mg under the tongue every 5 (five) minutes as needed.        Marland Kitchen OLANZapine (ZYPREXA) 5 MG tablet Take 1 tablet (5 mg total) by mouth at bedtime.  30 tablet  6  . omeprazole (PRILOSEC) 40 MG capsule       . polyethylene glycol (MIRALAX) packet Take 17 g by mouth 2 (two) times daily.  60 packet  6  . potassium chloride SA (K-DUR,KLOR-CON) 20 MEQ tablet Take 20 mEq by mouth daily.      . Probiotic Product (ALIGN) 4 MG CAPS Take 4 mg by mouth daily.      Marland Kitchen telmisartan (MICARDIS) 80 MG tablet Take 1 tablet (80 mg total) by mouth daily.  90 tablet  0  . traZODone (DESYREL) 100 MG tablet Take 100 mg by mouth at bedtime. Take one hour before bedtime       No current facility-administered medications for this visit.     REVIEW OF SYSTEMS:   Constitutional: Denies fevers, chills or night sweats Eyes: Denies blurriness of vision Ears, nose, mouth, throat, and face: Denies mucositis or sore throat Respiratory: Denies cough, dyspnea or wheezes Cardiovascular: Denies palpitation, chest discomfort or lower extremity swelling Gastrointestinal:  Denies nausea, heartburn or change in bowel habits Skin: Denies abnormal skin rashes Lymphatics: Denies new lymphadenopathy or easy bruising Neurological:Denies numbness, tingling or new weaknesses Behavioral/Psych: Mood is stable, no new changes  All  other systems were reviewed with the patient and are negative.  PHYSICAL EXAMINATION: ECOG PERFORMANCE STATUS: 0 - Asymptomatic  Filed Vitals:   11/30/13 1345  BP: 154/76  Pulse: 66  Temp: 97.3 F (36.3 C)  Resp: 17   Filed Weights   11/30/13 1345  Weight: 157 lb 3.2 oz (71.305 kg)    GENERAL:alert, no distress and comfortable. She looks thin but not cachectic SKIN: skin color, texture, turgor are normal, no rashes or significant lesions EYES: normal, Conjunctiva are pink and non-injected, sclera clear OROPHARYNX:no exudate, no erythema and lips, buccal mucosa,  and tongue normal  NECK: supple, thyroid normal size, non-tender, without nodularity LYMPH:  no palpable lymphadenopathy in the cervical, axillary or inguinal LUNGS: clear to auscultation and percussion with normal breathing effort HEART: regular rate & rhythm and no murmurs and no lower extremity edema ABDOMEN:abdomen soft, non-tender and normal bowel sounds Musculoskeletal:no cyanosis of digits and no clubbing  NEURO: alert & oriented x 3 with fluent speech, no focal motor/sensory deficits  LABORATORY DATA:  I have reviewed the data as listed Results for orders placed in visit on 11/30/13 (from the past 48 hour(s))  CBC WITH DIFFERENTIAL     Status: Abnormal   Collection Time    11/30/13  1:32 PM      Result Value Ref Range   WBC 3.1 (*) 3.9 - 10.3 10e3/uL   NEUT# 1.2 (*) 1.5 - 6.5 10e3/uL   HGB 11.2 (*) 11.6 - 15.9 g/dL   HCT 34.5 (*) 34.8 - 46.6 %   Platelets 182  145 - 400 10e3/uL   MCV 73.4 (*) 79.5 - 101.0 fL   MCH 23.8 (*) 25.1 - 34.0 pg   MCHC 32.5  31.5 - 36.0 g/dL   RBC 4.70  3.70 - 5.45 10e6/uL   RDW 16.2 (*) 11.2 - 14.5 %   lymph# 1.5  0.9 - 3.3 10e3/uL   MONO# 0.4  0.1 - 0.9 10e3/uL   Eosinophils Absolute 0.1  0.0 - 0.5 10e3/uL   Basophils Absolute 0.0  0.0 - 0.1 10e3/uL   NEUT% 37.4 (*) 38.4 - 76.8 %   LYMPH% 47.6  14.0 - 49.7 %   MONO% 11.5  0.0 - 14.0 %   EOS% 3.2  0.0 - 7.0 %   BASO% 0.3  0.0 - 2.0 %  [facility] SMEAR     Status: None   Collection Time    11/30/13  1:32 PM      Result Value Ref Range   Smear Result Smear Available    COMPREHENSIVE METABOLIC PANEL (UJ81)     Status: None   Collection Time    11/30/13  1:33 PM      Result Value Ref Range   Sodium 138  136 - 145 mEq/L   Potassium 3.8  3.5 - 5.1 mEq/L   Chloride 104  98 - 109 mEq/L   CO2 25  22 - 29 mEq/L   Glucose 72  70 - 140 mg/dl   BUN 11.7  7.0 - 26.0 mg/dL   Creatinine 0.8  0.6 - 1.1 mg/dL   Total Bilirubin 0.36  0.20 - 1.20 mg/dL   Alkaline Phosphatase 110  40 - 150 U/L   AST 26  5  - 34 U/L   ALT 12  0 - 55 U/L   Total Protein 7.0  6.4 - 8.3 g/dL   Albumin 3.8  3.5 - 5.0 g/dL   Calcium 10.0  8.4 - 10.4 mg/dL  Anion Gap 10  3 - 11 mEq/L    Lab Results  Component Value Date   WBC 3.1* 11/30/2013   HGB 11.2* 11/30/2013   HCT 34.5* 11/30/2013   MCV 73.4* 11/30/2013   PLT 182 11/30/2013  ASSESSMENT & PLAN:  #1 microcytic anemia On review on her older records, she has persistently elevated hemoglobin F. 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 She does not need further workup on this. Ferritin is within normal limits. #2 chronic leukopenia It is not uncommon for African American to have chronic leukopenia. T-cell rearrangement study excluded out LGL. The patient is asymptomatic. We will continue observation. #3 unexplained weight loss The patient had recurrent admission and that could cause this. I recommend close office of vision.  I'll see the patient back in 6 months. If her blood count remained the same, I will discharge her from the clinic. All questions were answered. The patient knows to call the clinic with any problems, questions or concerns. No barriers to learning was detected.  I spent 15 minutes counseling the patient face to face. The total time spent in the appointment was 20 minutes and more than 50% was on counseling.     Heath Lark, MD 11/30/2013 3:12 PM

## 2013-12-02 ENCOUNTER — Telehealth: Payer: Self-pay | Admitting: Hematology and Oncology

## 2013-12-02 NOTE — Telephone Encounter (Signed)
s.w. pt and advised on NOV appt....pt ok and aware °

## 2013-12-18 DIAGNOSIS — F331 Major depressive disorder, recurrent, moderate: Secondary | ICD-10-CM | POA: Diagnosis not present

## 2014-01-01 DIAGNOSIS — D759 Disease of blood and blood-forming organs, unspecified: Secondary | ICD-10-CM | POA: Diagnosis not present

## 2014-01-07 DIAGNOSIS — F329 Major depressive disorder, single episode, unspecified: Secondary | ICD-10-CM | POA: Diagnosis not present

## 2014-01-07 DIAGNOSIS — R05 Cough: Secondary | ICD-10-CM | POA: Diagnosis not present

## 2014-01-07 DIAGNOSIS — Z6827 Body mass index (BMI) 27.0-27.9, adult: Secondary | ICD-10-CM | POA: Diagnosis not present

## 2014-01-07 DIAGNOSIS — R059 Cough, unspecified: Secondary | ICD-10-CM | POA: Diagnosis not present

## 2014-01-07 DIAGNOSIS — G4700 Insomnia, unspecified: Secondary | ICD-10-CM | POA: Diagnosis not present

## 2014-01-07 DIAGNOSIS — D649 Anemia, unspecified: Secondary | ICD-10-CM | POA: Diagnosis not present

## 2014-01-14 DIAGNOSIS — R0602 Shortness of breath: Secondary | ICD-10-CM | POA: Diagnosis not present

## 2014-01-14 DIAGNOSIS — J309 Allergic rhinitis, unspecified: Secondary | ICD-10-CM | POA: Diagnosis not present

## 2014-01-30 ENCOUNTER — Telehealth: Payer: Self-pay | Admitting: *Deleted

## 2014-01-30 ENCOUNTER — Telehealth: Payer: Self-pay | Admitting: Cardiovascular Disease

## 2014-01-30 ENCOUNTER — Encounter: Payer: Self-pay | Admitting: *Deleted

## 2014-01-30 NOTE — Telephone Encounter (Signed)
Pt requests note faxed to [facility] stating she was here for appt on Nov 30, 2013.  Letter faxed to transportation at fax [phone] as requested.

## 2014-01-30 NOTE — Telephone Encounter (Signed)
New problem   Pt need a note faxed to [facility] at # [phone] stating that she was here for her appt on 11/14/13. Please call pt is any questions.

## 2014-01-30 NOTE — Telephone Encounter (Signed)
Pt calling to ask if we could write a letter to inform her transportation service that she was here on 4/29 for a f/u OV with Dr Acie Fredrickson and fax it to [facility] at [phone].  Informed pt that I would be more than happy to do this for her, but Dr Acie Fredrickson is out of the office today, and it will not have his signature on it.  Informed pt that if they do not accept the letter sent without his signature, then she should notify our office back to have this readdressed.  Pt verbalized understanding and very pleased with the assistance provided.

## 2014-01-31 ENCOUNTER — Other Ambulatory Visit: Payer: Self-pay | Admitting: *Deleted

## 2014-01-31 MED ORDER — TELMISARTAN 80 MG PO TABS: 80.0000 mg | ORAL_TABLET | Freq: Every day | ORAL | Status: DC

## 2014-02-13 DIAGNOSIS — R05 Cough: Secondary | ICD-10-CM | POA: Diagnosis not present

## 2014-02-13 DIAGNOSIS — R059 Cough, unspecified: Secondary | ICD-10-CM | POA: Diagnosis not present

## 2014-02-13 DIAGNOSIS — J310 Chronic rhinitis: Secondary | ICD-10-CM | POA: Diagnosis not present

## 2014-02-14 DIAGNOSIS — Z6827 Body mass index (BMI) 27.0-27.9, adult: Secondary | ICD-10-CM | POA: Diagnosis not present

## 2014-02-14 DIAGNOSIS — R7301 Impaired fasting glucose: Secondary | ICD-10-CM | POA: Diagnosis not present

## 2014-02-14 DIAGNOSIS — I10 Essential (primary) hypertension: Secondary | ICD-10-CM | POA: Diagnosis not present

## 2014-02-14 DIAGNOSIS — R05 Cough: Secondary | ICD-10-CM | POA: Diagnosis not present

## 2014-02-14 DIAGNOSIS — F329 Major depressive disorder, single episode, unspecified: Secondary | ICD-10-CM | POA: Diagnosis not present

## 2014-02-14 DIAGNOSIS — R059 Cough, unspecified: Secondary | ICD-10-CM | POA: Diagnosis not present

## 2014-02-14 DIAGNOSIS — N318 Other neuromuscular dysfunction of bladder: Secondary | ICD-10-CM | POA: Diagnosis not present

## 2014-03-19 DIAGNOSIS — R3989 Other symptoms and signs involving the genitourinary system: Secondary | ICD-10-CM | POA: Diagnosis not present

## 2014-04-01 DIAGNOSIS — D759 Disease of blood and blood-forming organs, unspecified: Secondary | ICD-10-CM | POA: Diagnosis not present

## 2014-04-17 DIAGNOSIS — F3342 Major depressive disorder, recurrent, in full remission: Secondary | ICD-10-CM | POA: Diagnosis not present

## 2014-04-19 DIAGNOSIS — J370 Chronic laryngitis: Secondary | ICD-10-CM | POA: Diagnosis not present

## 2014-04-19 DIAGNOSIS — J309 Allergic rhinitis, unspecified: Secondary | ICD-10-CM | POA: Diagnosis not present

## 2014-04-25 DIAGNOSIS — Z23 Encounter for immunization: Secondary | ICD-10-CM | POA: Diagnosis not present

## 2014-05-06 ENCOUNTER — Other Ambulatory Visit: Payer: Self-pay

## 2014-05-06 DIAGNOSIS — Z1239 Encounter for other screening for malignant neoplasm of breast: Secondary | ICD-10-CM

## 2014-05-06 DIAGNOSIS — Z1231 Encounter for screening mammogram for malignant neoplasm of breast: Secondary | ICD-10-CM

## 2014-05-10 DIAGNOSIS — R05 Cough: Secondary | ICD-10-CM | POA: Diagnosis not present

## 2014-05-10 DIAGNOSIS — J370 Chronic laryngitis: Secondary | ICD-10-CM | POA: Diagnosis not present

## 2014-05-13 ENCOUNTER — Other Ambulatory Visit (INDEPENDENT_AMBULATORY_CARE_PROVIDER_SITE_OTHER): Payer: Medicare Other | Admitting: *Deleted

## 2014-05-13 ENCOUNTER — Encounter: Payer: Self-pay | Admitting: Cardiovascular Disease

## 2014-05-13 ENCOUNTER — Ambulatory Visit (INDEPENDENT_AMBULATORY_CARE_PROVIDER_SITE_OTHER): Payer: Medicare Other | Admitting: Cardiovascular Disease

## 2014-05-13 VITALS — BP 136/80 | HR 60 | Ht 63.0 in | Wt 145.6 lb

## 2014-05-13 DIAGNOSIS — E785 Hyperlipidemia, unspecified: Secondary | ICD-10-CM

## 2014-05-13 DIAGNOSIS — I2510 Atherosclerotic heart disease of native coronary artery without angina pectoris: Secondary | ICD-10-CM

## 2014-05-13 DIAGNOSIS — I10 Essential (primary) hypertension: Secondary | ICD-10-CM

## 2014-05-13 LAB — HEPATIC FUNCTION PANEL
ALT: 15 U/L (ref 0–35)
AST: 26 U/L (ref 0–37)
Albumin: 3.5 g/dL (ref 3.5–5.2)
Alkaline Phosphatase: 102 U/L (ref 39–117)
Bilirubin, Direct: 0 mg/dL (ref 0.0–0.3)
Total Bilirubin: 0.4 mg/dL (ref 0.2–1.2)
Total Protein: 7.1 g/dL (ref 6.0–8.3)

## 2014-05-13 LAB — BASIC METABOLIC PANEL
BUN: 10 mg/dL (ref 6–23)
CO2: 26 mEq/L (ref 19–32)
Calcium: 9.4 mg/dL (ref 8.4–10.5)
Chloride: 101 mEq/L (ref 96–112)
Creatinine, Ser: 0.9 mg/dL (ref 0.4–1.2)
GFR: 82.64 mL/min (ref >60.00)
Glucose, Bld: 74 mg/dL (ref 70–99)
Potassium: 3.9 mEq/L (ref 3.5–5.1)
Sodium: 139 mEq/L (ref 135–145)

## 2014-05-13 LAB — LIPID PANEL
Cholesterol: 193 mg/dL (ref 0–200)
HDL: 74.6 mg/dL (ref >39.00)
LDL Cholesterol: 107 mg/dL — ABNORMAL HIGH (ref 0–99)
NonHDL: 118.4
Total CHOL/HDL Ratio: 3
Triglycerides: 58 mg/dL (ref 0.0–149.0)
VLDL: 11.6 mg/dL (ref 0.0–40.0)

## 2014-05-13 NOTE — Patient Instructions (Signed)
Your physician recommends that you continue on your current medications as directed. Please refer to the Current Medication list given to you today.  Your physician wants you to follow-up in: 6 months with Dr. Nahser.  You will receive a reminder letter in the mail two months in advance. If you don't receive a letter, please call our office to schedule the follow-up appointment.  

## 2014-05-13 NOTE — Assessment & Plan Note (Signed)
[name] is doing  Well. No CP or dyspnea Continue same meds.  Will see her in 6 months

## 2014-05-13 NOTE — Progress Notes (Signed)
[name] Date of Birth  1932-05-07 [facility] / [facility] [address]     [place], [place]  [address] [phone]  Fax  [phone]  Problems: 1. Minimal coronary artery disease 2. Hyperlipidemia 3. Chronic cough 4. Hypothyroidism 5. Status post Nissen fundoplication for acid reflux 6. Hypertension  History of Present Illness:  [name] is an elderly female with a history of chest pain with minimal coronary artery disease. She has a history of hyperlipidemia chronic cough she status post Nissen fundoplication for acid reflux. She has a history of hypertension.  She's continued to have some episodes of chest discomfort. These are fairly atypical.  Dec. 4, 2013: She has been having upper abdominal / lower chest discomfort pain.  The pain is eased with NTG.  She does not think the symptoms are due to soft reflux. She stopped her proton pump inhibitor last year. She was  concerned about side effects including osteoporosis.  Sept. 12, 2014:  She is doing well from a cardiac standpoint.  She continues to have problems with GERD. She has had a nissan fundiplication - helped the cough but now she had recurrent GERD.    She has been gaining weight.  Has some numbness on the lateral aspect of her right legs.   November 14, 2013:  She was recently hospitalized for generalized weakness and coughing.  She was found to have hyponatremia.  She has been feeling well.  She bought some compression hose but they were too long ( ended at the bend of her knee) and she could not wear them.  She is going to go back to the supply store to get a shorter pair.   She denies any CP or dyspnea.    Oct. 26, 2015:  [name] is doing well.   No CP or dyspnea.    Current Outpatient Prescriptions on File Prior to Visit  Medication Sig Dispense Refill  . amLODipine (NORVASC) 10 MG tablet Take 1 tablet (10 mg total) by mouth daily.  90 tablet  3  . aspirin  EC 81 MG tablet Take 81 mg by mouth daily.      . cholecalciferol (VITAMIN D) 1000 UNITS tablet Take 1,000 Units by mouth daily.      . cloNIDine (CATAPRES) 0.1 MG tablet Take 0.1 mg by mouth 2 (two) times daily.       Marland Kitchen docusate sodium (COLACE) 100 MG capsule Take 100 mg by mouth daily.      Marland Kitchen levothyroxine (SYNTHROID, LEVOTHROID) 137 MCG tablet Take 137 mcg by mouth daily before breakfast.      . LINZESS 145 MCG CAPS capsule Take 145 mcg by mouth daily.       . Multiple Vitamin (MULTIVITAMIN WITH MINERALS) TABS Take 1 tablet by mouth daily.      . nitroGLYCERIN (NITROSTAT) 0.4 MG SL tablet Place 0.4 mg under the tongue every 5 (five) minutes as needed.        Marland Kitchen OLANZapine (ZYPREXA) 5 MG tablet Take 1 tablet (5 mg total) by mouth at bedtime.  30 tablet  6  . omeprazole (PRILOSEC) 40 MG capsule Take 40 mg by mouth daily.       . polyethylene glycol (MIRALAX) packet Take 17 g by mouth 2 (two) times daily.  60 packet  6  . potassium chloride SA (K-DUR,KLOR-CON) 20 MEQ tablet Take 20 mEq by mouth daily.      . Probiotic Product (ALIGN) 4 MG CAPS Take  4 mg by mouth daily.      Marland Kitchen telmisartan (MICARDIS) 80 MG tablet Take 1 tablet (80 mg total) by mouth daily.  90 tablet  0  . traZODone (DESYREL) 100 MG tablet Take 100 mg by mouth at bedtime. Take one hour before bedtime       No current facility-administered medications on file prior to visit.    Allergies  Allergen Reactions  . Cholestatin   . Doxycycline     REACTION: itching/rash  . Prednisone     REACTION: itching  . Statins Other (See Comments)    Muscle hurts    Past Medical History  Diagnosis Date  . Pneumonia   . Hypothyroidism   . Hypertension   . Hyperlipemia   . CAD (coronary artery disease)   . GERD (gastroesophageal reflux disease)   . Leukocytopenia   . Hypothyroid 09/11/2011  . Osteoporosis 09/11/2011  . Myocardial infarction 09/11/2011    Per pt in 2004  . Arthritis   . Liver mass 09/11/2011    Per report of pt  .  Kidney mass 09/11/2011    Per report of pt  . Esophageal abnormality 09/11/2011    Surgically treated per pt.  . Hypercholesterolemia 09/11/2011  . Headache(784.0)   . Cataracts, bilateral 09/11/2011    Per report of pt.  . Insect bite of ankle, left 09/11/2011    back of ankle  . Thigh cramp 09/11/2011    Left thigh x 3 nights since starting unspecified psychotropic medication  . Anemia 06/02/2012  . Mental health disorder   . Anxiety     Past Surgical History  Procedure Laterality Date  . US echocardiography  06-30-2005    Est EF 55-60%  . Cardiovascular stress test  08-15-2007    EF 59%  . Hysterotomy    . Appendectomy    . Cholecystectomy open    . Tonsillectomy    . Knee surgery  09/11/2011  . Gallbladder surgery  09/11/2011  . Abdominal hysterectomy    . Shoulder surgery  09/11/2011    Right side    History  Smoking status  . Never Smoker   Smokeless tobacco  . Never Used    History  Alcohol Use No    Family History  Problem Relation Age of Onset  . Hypertension Sister   . Hypertension Brother     Reviw of Systems:  Reviewed in the HPI.  All other systems are negative.  Physical Exam: BP 136/80  Pulse 60  Ht 5\' 3"  (1.6 m)  Wt 145 lb 9.6 oz (66.044 kg)  BMI 25.80 kg/m2 The patient is alert and oriented x 3.  The mood and affect are normal.   Skin: warm and dry.  Color is normal.    HEENT:   the sclera are nonicteric.  The mucous membranes are moist.  The carotids are 2+ without bruits.  There is no thyromegaly.  There is no JVD.    Lungs: clear.  The chest wall is non tender.    Heart: regular rate with a normal S1 and S2.  There are no murmurs, gallops, or rubs. The PMI is not displaced.     Abdomen: good bowel sounds.  There is no guarding or rebound.  There is no hepatosplenomegaly or tenderness.  There are no masses.   Extremities:  no clubbing, cyanosis, or edema.  The legs are without rashes.  The distal pulses are intact.   Neuro:  Cranial  nerves II -  XII are intact.  Motor and sensory functions are intact.    The gait is normal.  EKG: November 14, 2013 :  NSR at 13.  Normal sinus rhythm at 78  Assessment / Plan:

## 2014-05-13 NOTE — Assessment & Plan Note (Signed)
Will check labs today. Stay on same meds

## 2014-06-07 ENCOUNTER — Other Ambulatory Visit (HOSPITAL_BASED_OUTPATIENT_CLINIC_OR_DEPARTMENT_OTHER): Payer: Medicare Other

## 2014-06-07 ENCOUNTER — Ambulatory Visit (HOSPITAL_BASED_OUTPATIENT_CLINIC_OR_DEPARTMENT_OTHER): Payer: Medicare Other | Admitting: Hematology and Oncology

## 2014-06-07 ENCOUNTER — Encounter: Payer: Self-pay | Admitting: Hematology and Oncology

## 2014-06-07 ENCOUNTER — Telehealth: Payer: Self-pay | Admitting: Hematology and Oncology

## 2014-06-07 VITALS — BP 136/79 | HR 66 | Temp 97.9°F | Resp 18 | Ht 63.0 in | Wt 147.5 lb

## 2014-06-07 DIAGNOSIS — R634 Abnormal weight loss: Secondary | ICD-10-CM | POA: Insufficient documentation

## 2014-06-07 DIAGNOSIS — D6489 Other specified anemias: Secondary | ICD-10-CM

## 2014-06-07 DIAGNOSIS — D72819 Decreased white blood cell count, unspecified: Secondary | ICD-10-CM

## 2014-06-07 DIAGNOSIS — D509 Iron deficiency anemia, unspecified: Secondary | ICD-10-CM | POA: Diagnosis not present

## 2014-06-07 LAB — CBC & DIFF AND RETIC
BASO%: 0.4 % (ref 0.0–2.0)
Basophils Absolute: 0 10*3/uL (ref 0.0–0.1)
EOS%: 1.9 % (ref 0.0–7.0)
Eosinophils Absolute: 0.1 10*3/uL (ref 0.0–0.5)
HCT: 34.7 % — ABNORMAL LOW (ref 34.8–46.6)
HGB: 11.1 g/dL — ABNORMAL LOW (ref 11.6–15.9)
Immature Retic Fract: 3.7 % (ref 1.60–10.00)
LYMPH%: 53 % — ABNORMAL HIGH (ref 14.0–49.7)
MCH: 23.8 pg — ABNORMAL LOW (ref 25.1–34.0)
MCHC: 32 g/dL (ref 31.5–36.0)
MCV: 74.5 fL — ABNORMAL LOW (ref 79.5–101.0)
MONO#: 0.3 10*3/uL (ref 0.1–0.9)
MONO%: 10 % (ref 0.0–14.0)
NEUT#: 0.9 10*3/uL — ABNORMAL LOW (ref 1.5–6.5)
NEUT%: 34.7 % — ABNORMAL LOW (ref 38.4–76.8)
Platelets: 161 10*3/uL (ref 145–400)
RBC: 4.66 10*6/uL (ref 3.70–5.45)
RDW: 16.2 % — ABNORMAL HIGH (ref 11.2–14.5)
Retic %: 1.14 % (ref 0.70–2.10)
Retic Ct Abs: 53.12 10*3/uL (ref 33.70–90.70)
WBC: 2.7 10*3/uL — ABNORMAL LOW (ref 3.9–10.3)
lymph#: 1.4 10*3/uL (ref 0.9–3.3)

## 2014-06-07 LAB — MORPHOLOGY: PLT EST: ADEQUATE

## 2014-06-07 NOTE — Assessment & Plan Note (Signed)
She has chronic microcytic anemia which is suspected might be thalassemia trait in the background. She is not symptomatic. Recommend close observation only.   

## 2014-06-07 NOTE — Assessment & Plan Note (Signed)
She has mild weight loss of unknown etiology but claims that she is eating normal. At this point in time, I recommend observation only.

## 2014-06-07 NOTE — Assessment & Plan Note (Signed)
This is chronic in nature and she is not symptomatic. I recommend close observation only. There is no indication for treatment now.

## 2014-06-07 NOTE — Progress Notes (Signed)
[place] OFFICE PROGRESS NOTE  Marton Redwood, MD SUMMARY OF HEMATOLOGIC HISTORY:  This patient has leukopenia and anemia for a while. She had a bone marrow aspirate and biopsy done in 2007 which was normal. 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 T-cell rearrangement study ruled out LGL. The cause of leukopenia is likely related to African American heritage. INTERVAL HISTORY: [name] 80 y.o. female returns for further follow-up. She feels well. Denies recent infection. The patient denies any recent signs or symptoms of bleeding such as spontaneous epistaxis, hematuria or hematochezia.  I have reviewed the past medical history, past surgical history, social history and family history with the patient and they are unchanged from previous note.  ALLERGIES:  is allergic to cholestatin; doxycycline; prednisone; and statins.  MEDICATIONS:  Current Outpatient Prescriptions  Medication Sig Dispense Refill  . amLODipine (NORVASC) 10 MG tablet Take 1 tablet (10 mg total) by mouth daily. 90 tablet 3  . aspirin EC 81 MG tablet Take 81 mg by mouth daily.    . cholecalciferol (VITAMIN D) 1000 UNITS tablet Take 1,000 Units by mouth daily.    . cloNIDine (CATAPRES) 0.1 MG tablet Take 0.1 mg by mouth 2 (two) times daily.     Marland Kitchen docusate sodium (COLACE) 100 MG capsule Take 100 mg by mouth daily.    . fesoterodine (TOVIAZ) 8 MG TB24 tablet Take 8 mg by mouth daily.    Marland Kitchen ipratropium (ATROVENT) 0.06 % nasal spray Place 2 sprays into both nostrils 2 (two) times daily.     Marland Kitchen levothyroxine (SYNTHROID, LEVOTHROID) 137 MCG tablet Take 137 mcg by mouth daily before breakfast.    . LINZESS 145 MCG CAPS capsule Take 145 mcg by mouth daily.     . Multiple Vitamin (MULTIVITAMIN WITH MINERALS) TABS Take 1 tablet by mouth daily.    . nitroGLYCERIN (NITROSTAT) 0.4 MG SL tablet Place 0.4 mg under the tongue every 5  (five) minutes as needed.      Marland Kitchen OLANZapine (ZYPREXA) 5 MG tablet Take 1 tablet (5 mg total) by mouth at bedtime. 30 tablet 6  . omeprazole (PRILOSEC) 40 MG capsule Take 40 mg by mouth daily.     . polyethylene glycol (MIRALAX) packet Take 17 g by mouth 2 (two) times daily. 60 packet 6  . potassium chloride SA (K-DUR,KLOR-CON) 20 MEQ tablet Take 20 mEq by mouth daily.    . Probiotic Product (ALIGN) 4 MG CAPS Take 4 mg by mouth daily.    . ranitidine (ZANTAC) 300 MG tablet Take 300 mg by mouth daily.     . solifenacin (VESICARE) 10 MG tablet Take 10 mg by mouth daily.    Marland Kitchen telmisartan (MICARDIS) 80 MG tablet Take 1 tablet (80 mg total) by mouth daily. 90 tablet 0  . traZODone (DESYREL) 100 MG tablet Take 100 mg by mouth at bedtime. Take one hour before bedtime     No current facility-administered medications for this visit.     REVIEW OF SYSTEMS:   Constitutional: Denies fevers, chills or night sweats Eyes: Denies blurriness of vision Ears, nose, mouth, throat, and face: Denies mucositis or sore throat Respiratory: Denies cough, dyspnea or wheezes Cardiovascular: Denies palpitation, chest discomfort or lower extremity swelling Gastrointestinal:  Denies nausea, heartburn or change in bowel habits Skin: Denies abnormal skin rashes Lymphatics: Denies new lymphadenopathy or easy bruising Neurological:Denies numbness, tingling or new weaknesses Behavioral/Psych: Mood is stable, no new  changes  All other systems were reviewed with the patient and are negative.  PHYSICAL EXAMINATION: ECOG PERFORMANCE STATUS: 0 - Asymptomatic  Filed Vitals:   06/07/14 1336  BP: 136/79  Pulse: 66  Temp: 97.9 F (36.6 C)  Resp: 18   Filed Weights   06/07/14 1336  Weight: 147 lb 8 oz (66.906 kg)    GENERAL:alert, no distress and comfortable SKIN: skin color, texture, turgor are normal, no rashes or significant lesions EYES: normal, Conjunctiva are pink and non-injected, sclera clear OROPHARYNX:no  exudate, no erythema and lips, buccal mucosa, and tongue normal  NECK: supple, thyroid normal size, non-tender, without nodularity LYMPH:  no palpable lymphadenopathy in the cervical, axillary or inguinal LUNGS: clear to auscultation and percussion with normal breathing effort HEART: regular rate & rhythm and no murmurs and no lower extremity edema ABDOMEN:abdomen soft, non-tender and normal bowel sounds Musculoskeletal:no cyanosis of digits and no clubbing  NEURO: alert & oriented x 3 with fluent speech, no focal motor/sensory deficits  LABORATORY DATA:  I have reviewed the data as listed Results for orders placed or performed in visit on 06/07/14 (from the past 48 hour(s))  Morphology     Status: None   Collection Time: 06/07/14  1:19 PM  Result Value Ref Range   Polychromasia Slight Slight   Ovalocytes Moderate Negative   Target Cells Moderate Negative   Helmet Cell Few Negative   White Cell Comments Variant Lymphs    PLT EST Adequate Adequate  CBC & Diff and Retic     Status: Abnormal   Collection Time: 06/07/14  1:19 PM  Result Value Ref Range   WBC 2.7 (L) 3.9 - 10.3 10e3/uL   NEUT# 0.9 (L) 1.5 - 6.5 10e3/uL   HGB 11.1 (L) 11.6 - 15.9 g/dL   HCT 34.7 (L) 34.8 - 46.6 %   Platelets 161 145 - 400 10e3/uL   MCV 74.5 (L) 79.5 - 101.0 fL   MCH 23.8 (L) 25.1 - 34.0 pg   MCHC 32.0 31.5 - 36.0 g/dL   RBC 4.66 3.70 - 5.45 10e6/uL   RDW 16.2 (H) 11.2 - 14.5 %   lymph# 1.4 0.9 - 3.3 10e3/uL   MONO# 0.3 0.1 - 0.9 10e3/uL   Eosinophils Absolute 0.1 0.0 - 0.5 10e3/uL   Basophils Absolute 0.0 0.0 - 0.1 10e3/uL   NEUT% 34.7 (L) 38.4 - 76.8 %   LYMPH% 53.0 (H) 14.0 - 49.7 %   MONO% 10.0 0.0 - 14.0 %   EOS% 1.9 0.0 - 7.0 %   BASO% 0.4 0.0 - 2.0 %   Retic % 1.14 0.70 - 2.10 %   Retic Ct Abs 53.12 33.70 - 90.70 10e3/uL   Immature Retic Fract 3.70 1.60 - 10.00 %    Lab Results  Component Value Date   WBC 2.7* 06/07/2014   HGB 11.1* 06/07/2014   HCT 34.7* 06/07/2014   MCV 74.5*  06/07/2014   PLT 161 06/07/2014    ASSESSMENT & PLAN:  Leukopenia This is chronic in nature and she is not symptomatic. I recommend close observation only. There is no indication for treatment now.  Anemia She has chronic microcytic anemia which is suspected might be thalassemia trait in the background. She is not symptomatic. Recommend close observation only.  Weight loss She has mild weight loss of unknown etiology but claims that she is eating normal. At this point in time, I recommend observation only.   All questions were answered. The patient knows to call  the clinic with any problems, questions or concerns. No barriers to learning was detected.  I spent 15 minutes counseling the patient face to face. The total time spent in the appointment was 20 minutes and more than 50% was on counseling.     [facility], [place], MD 06/07/2014 3:16 PM

## 2014-06-07 NOTE — Telephone Encounter (Signed)
Gave avs & cal for May 2016. °

## 2014-06-08 LAB — SEDIMENTATION RATE: Sed Rate: 4 mm/hr (ref 0–22)

## 2014-06-17 DIAGNOSIS — F22 Delusional disorders: Secondary | ICD-10-CM | POA: Diagnosis not present

## 2014-06-18 ENCOUNTER — Ambulatory Visit
Admission: RE | Admit: 2014-06-18 | Discharge: 2014-06-18 | Disposition: A | Discharge status: Home / Self Care | Payer: Medicare Other | Source: Ambulatory Visit

## 2014-06-18 DIAGNOSIS — Z1231 Encounter for screening mammogram for malignant neoplasm of breast: Secondary | ICD-10-CM | POA: Diagnosis not present

## 2014-07-16 DIAGNOSIS — R0602 Shortness of breath: Secondary | ICD-10-CM | POA: Diagnosis not present

## 2014-07-16 DIAGNOSIS — R05 Cough: Secondary | ICD-10-CM | POA: Diagnosis not present

## 2014-07-16 DIAGNOSIS — J309 Allergic rhinitis, unspecified: Secondary | ICD-10-CM | POA: Diagnosis not present

## 2014-07-17 DIAGNOSIS — R399 Unspecified symptoms and signs involving the genitourinary system: Secondary | ICD-10-CM | POA: Diagnosis not present

## 2014-08-14 DIAGNOSIS — E785 Hyperlipidemia, unspecified: Secondary | ICD-10-CM | POA: Diagnosis not present

## 2014-08-14 DIAGNOSIS — Z0000 Encounter for general adult medical examination without abnormal findings: Secondary | ICD-10-CM | POA: Diagnosis not present

## 2014-08-14 DIAGNOSIS — E559 Vitamin D deficiency, unspecified: Secondary | ICD-10-CM | POA: Diagnosis not present

## 2014-08-14 DIAGNOSIS — R7302 Impaired glucose tolerance (oral): Secondary | ICD-10-CM | POA: Diagnosis not present

## 2014-08-14 DIAGNOSIS — E039 Hypothyroidism, unspecified: Secondary | ICD-10-CM | POA: Diagnosis not present

## 2014-08-14 DIAGNOSIS — I10 Essential (primary) hypertension: Secondary | ICD-10-CM | POA: Diagnosis not present

## 2014-08-15 DIAGNOSIS — F22 Delusional disorders: Secondary | ICD-10-CM | POA: Diagnosis not present

## 2014-08-19 DIAGNOSIS — R7302 Impaired glucose tolerance (oral): Secondary | ICD-10-CM | POA: Diagnosis not present

## 2014-08-19 DIAGNOSIS — F329 Major depressive disorder, single episode, unspecified: Secondary | ICD-10-CM | POA: Diagnosis not present

## 2014-08-19 DIAGNOSIS — I2510 Atherosclerotic heart disease of native coronary artery without angina pectoris: Secondary | ICD-10-CM | POA: Diagnosis not present

## 2014-08-19 DIAGNOSIS — R05 Cough: Secondary | ICD-10-CM | POA: Diagnosis not present

## 2014-08-19 DIAGNOSIS — I10 Essential (primary) hypertension: Secondary | ICD-10-CM | POA: Diagnosis not present

## 2014-08-19 DIAGNOSIS — Z0000 Encounter for general adult medical examination without abnormal findings: Secondary | ICD-10-CM | POA: Diagnosis not present

## 2014-08-19 DIAGNOSIS — E039 Hypothyroidism, unspecified: Secondary | ICD-10-CM | POA: Diagnosis not present

## 2014-08-19 DIAGNOSIS — N3281 Overactive bladder: Secondary | ICD-10-CM | POA: Diagnosis not present

## 2014-08-19 DIAGNOSIS — E785 Hyperlipidemia, unspecified: Secondary | ICD-10-CM | POA: Diagnosis not present

## 2014-08-19 DIAGNOSIS — Z1389 Encounter for screening for other disorder: Secondary | ICD-10-CM | POA: Diagnosis not present

## 2014-08-19 DIAGNOSIS — Z23 Encounter for immunization: Secondary | ICD-10-CM | POA: Diagnosis not present

## 2014-08-19 DIAGNOSIS — Z6827 Body mass index (BMI) 27.0-27.9, adult: Secondary | ICD-10-CM | POA: Diagnosis not present

## 2014-08-23 ENCOUNTER — Encounter: Payer: Self-pay | Admitting: Cardiovascular Disease

## 2014-09-09 DIAGNOSIS — R3915 Urgency of urination: Secondary | ICD-10-CM | POA: Diagnosis not present

## 2014-09-09 DIAGNOSIS — N3941 Urge incontinence: Secondary | ICD-10-CM | POA: Diagnosis not present

## 2014-09-09 DIAGNOSIS — R350 Frequency of micturition: Secondary | ICD-10-CM | POA: Diagnosis not present

## 2014-09-12 DIAGNOSIS — F22 Delusional disorders: Secondary | ICD-10-CM | POA: Diagnosis not present

## 2014-09-23 DIAGNOSIS — N3941 Urge incontinence: Secondary | ICD-10-CM | POA: Diagnosis not present

## 2014-09-24 DIAGNOSIS — E785 Hyperlipidemia, unspecified: Secondary | ICD-10-CM | POA: Diagnosis not present

## 2014-09-24 DIAGNOSIS — I10 Essential (primary) hypertension: Secondary | ICD-10-CM | POA: Diagnosis not present

## 2014-09-24 DIAGNOSIS — E559 Vitamin D deficiency, unspecified: Secondary | ICD-10-CM | POA: Diagnosis not present

## 2014-09-24 DIAGNOSIS — M859 Disorder of bone density and structure, unspecified: Secondary | ICD-10-CM | POA: Diagnosis not present

## 2014-09-30 DIAGNOSIS — N3941 Urge incontinence: Secondary | ICD-10-CM | POA: Diagnosis not present

## 2014-09-30 DIAGNOSIS — R3915 Urgency of urination: Secondary | ICD-10-CM | POA: Diagnosis not present

## 2014-09-30 DIAGNOSIS — R350 Frequency of micturition: Secondary | ICD-10-CM | POA: Diagnosis not present

## 2014-10-28 ENCOUNTER — Encounter: Payer: Self-pay | Admitting: Cardiovascular Disease

## 2014-11-11 NOTE — Progress Notes (Signed)
Cardiology Office Note   Date:  11/11/2014   ID:  [name], [name] 05-18-1932, MRN [id number]  PCP:  Marton Redwood, MD  Cardiologist:   Acie Fredrickson Wonda Cheng, MD   No chief complaint on file.  1. Minimal coronary artery disease 2. Hyperlipidemia 3. Chronic cough 4. Hypothyroidism 5. Status post Nissen fundoplication for acid reflux 6. Hypertension  History of Present Illness:  [name] is an elderly female with a history of chest pain with minimal coronary artery disease. She has a history of hyperlipidemia chronic cough she status post Nissen fundoplication for acid reflux. She has a history of hypertension.  She's continued to have some episodes of chest discomfort. These are fairly atypical.  Dec. 4, 2013: She has been having upper abdominal / lower chest discomfort pain. The pain is eased with NTG. She does not think the symptoms are due to soft reflux. She stopped her proton pump inhibitor last year. She was concerned about side effects including osteoporosis.  Sept. 12, 2014:  She is doing well from a cardiac standpoint. She continues to have problems with GERD. She has had a nissan fundiplication - helped the cough but now she had recurrent GERD.   She has been gaining weight. Has some numbness on the lateral aspect of her right legs.   November 14, 2013:  She was recently hospitalized for generalized weakness and coughing. She was found to have hyponatremia. She has been feeling well. She bought some compression hose but they were too long ( ended at the bend of her knee) and she could not wear them. She is going to go back to the supply store to get a shorter pair. She denies any CP or dyspnea.   Oct. 26, 2015:  [name] is doing well.  No CP or dyspnea.   November 12, 2014:   [name] is a 81 y.o. female who presents for follow up of her HTN Has occasional palpitations.  Has had some leg pain  - especially in the morning.  Not worsened  with exercise .   Past Medical History  Diagnosis Date  . Pneumonia   . Hypothyroidism   . Hypertension   . Hyperlipemia   . CAD (coronary artery disease)   . GERD (gastroesophageal reflux disease)   . Leukocytopenia   . Hypothyroid 09/11/2011  . Osteoporosis 09/11/2011  . Myocardial infarction 09/11/2011    Per pt in 2004  . Arthritis   . Liver mass 09/11/2011    Per report of pt  . Kidney mass 09/11/2011    Per report of pt  . Esophageal abnormality 09/11/2011    Surgically treated per pt.  . Hypercholesterolemia 09/11/2011  . Headache(784.0)   . Cataracts, bilateral 09/11/2011    Per report of pt.  . Insect bite of ankle, left 09/11/2011    back of ankle  . Thigh cramp 09/11/2011    Left thigh x 3 nights since starting unspecified psychotropic medication  . Anemia 06/02/2012  . Mental health disorder   . Anxiety     Past Surgical History  Procedure Laterality Date  . US echocardiography  06-30-2005    Est EF 55-60%  . Cardiovascular stress test  08-15-2007    EF 59%  . Hysterotomy    . Appendectomy    . Cholecystectomy open    . Tonsillectomy    . Knee surgery  09/11/2011  . Gallbladder surgery  09/11/2011  . Abdominal hysterectomy    . Shoulder surgery  09/11/2011    Right side     Current Outpatient Prescriptions  Medication Sig Dispense Refill  . amLODipine (NORVASC) 10 MG tablet Take 1 tablet (10 mg total) by mouth daily. 90 tablet 3  . aspirin EC 81 MG tablet Take 81 mg by mouth daily.    . cholecalciferol (VITAMIN D) 1000 UNITS tablet Take 1,000 Units by mouth daily.    . cloNIDine (CATAPRES) 0.1 MG tablet Take 0.1 mg by mouth 2 (two) times daily.     Marland Kitchen docusate sodium (COLACE) 100 MG capsule Take 100 mg by mouth daily.    . fesoterodine (TOVIAZ) 8 MG TB24 tablet Take 8 mg by mouth daily.    Marland Kitchen ipratropium (ATROVENT) 0.06 % nasal spray Place 2 sprays into both nostrils 2 (two) times daily.     Marland Kitchen levothyroxine (SYNTHROID, LEVOTHROID) 137 MCG tablet Take 137 mcg  by mouth daily before breakfast.    . LINZESS 145 MCG CAPS capsule Take 145 mcg by mouth daily.     . Multiple Vitamin (MULTIVITAMIN WITH MINERALS) TABS Take 1 tablet by mouth daily.    . nitroGLYCERIN (NITROSTAT) 0.4 MG SL tablet Place 0.4 mg under the tongue every 5 (five) minutes as needed.      Marland Kitchen OLANZapine (ZYPREXA) 5 MG tablet Take 1 tablet (5 mg total) by mouth at bedtime. 30 tablet 6  . omeprazole (PRILOSEC) 40 MG capsule Take 40 mg by mouth daily.     . polyethylene glycol (MIRALAX) packet Take 17 g by mouth 2 (two) times daily. 60 packet 6  . potassium chloride SA (K-DUR,KLOR-CON) 20 MEQ tablet Take 20 mEq by mouth daily.    . Probiotic Product (ALIGN) 4 MG CAPS Take 4 mg by mouth daily.    . ranitidine (ZANTAC) 300 MG tablet Take 300 mg by mouth daily.     . solifenacin (VESICARE) 10 MG tablet Take 10 mg by mouth daily.    Marland Kitchen telmisartan (MICARDIS) 80 MG tablet Take 1 tablet (80 mg total) by mouth daily. 90 tablet 0  . traZODone (DESYREL) 100 MG tablet Take 100 mg by mouth at bedtime. Take one hour before bedtime     No current facility-administered medications for this visit.    Allergies:   Cholestatin; Doxycycline; Prednisone; and Statins    Social History:  The patient  reports that she has never smoked. She has never used smokeless tobacco. She reports that she does not drink alcohol or use illicit drugs.   Family History:  The patient's family history includes Hypertension in her brother and sister.    ROS:  Please see the history of present illness.    Review of Systems: Constitutional:  denies fever, chills, diaphoresis, appetite change and fatigue.  HEENT: denies photophobia, eye pain, redness, hearing loss, ear pain, congestion, sore throat, rhinorrhea, sneezing, neck pain, neck stiffness and tinnitus.  Respiratory: denies SOB, DOE, cough, chest tightness, and wheezing.  Cardiovascular: denies chest pain, palpitations and leg swelling.  Gastrointestinal: denies  nausea, vomiting, abdominal pain, diarrhea, constipation, blood in stool.  Genitourinary: denies dysuria, urgency, frequency, hematuria, flank pain and difficulty urinating.  Musculoskeletal: denies  myalgias, back pain, joint swelling,  and gait problem.  She does have leg pain in the mornings  Skin: denies pallor, rash and wound.  Neurological: denies dizziness, seizures, syncope, weakness, light-headedness, numbness and headaches.   Hematological: denies adenopathy, easy bruising, personal or family bleeding history.  Psychiatric/ Behavioral: denies suicidal ideation, mood changes, confusion, nervousness, sleep disturbance  and agitation.       All other systems are reviewed and negative.    PHYSICAL EXAM: VS:  There were no vitals taken for this visit. , BMI There is no weight on file to calculate BMI. GEN: Well nourished, well developed, in no acute distress HEENT: normal Neck: no JVD, carotid bruits, or masses Cardiac: RRR; no murmurs, rubs, or gallops,no edema  Respiratory:  clear to auscultation bilaterally, normal work of breathing GI: soft, nontender, nondistended, + BS MS: no deformity or atrophy,  Distal foot pulses are quite good.  Skin: warm and dry, no rash Neuro:  Strength and sensation are intact Psych: normal   EKG:  EKG is ordered today. The ekg ordered today demonstrates :  NSR at 77, NS T wave changes.    Recent Labs: 05/13/2014: ALT 15; BUN 10; Creatinine 0.9; Potassium 3.9; Sodium 139 06/07/2014: Hemoglobin 11.1*; Platelets 161    Lipid Panel    Component Value Date/Time   CHOL 193 05/13/2014 0757   TRIG 58.0 05/13/2014 0757   HDL 74.60 05/13/2014 0757   CHOLHDL 3 05/13/2014 0757   VLDL 11.6 05/13/2014 0757   LDLCALC 107* 05/13/2014 0757   LDLDIRECT 109.5 03/29/2011 1007      Wt Readings from Last 3 Encounters:  06/07/14 147 lb 8 oz (66.906 kg)  05/13/14 145 lb 9.6 oz (66.044 kg)  11/30/13 157 lb 3.2 oz (71.305 kg)      Other studies  Reviewed: Additional studies/ records that were reviewed today include: . Review of the above records demonstrates:    ASSESSMENT AND PLAN:  1. Minimal coronary artery disease - no CP. Doing well.  2. Hyperlipidemia -  Managed by her primary medical doctor  3. Chronic cough 4. Hypothyroidism 5. Status post Nissen fundoplication for acid reflux  6. Hypertension - BP is well controlled.     Current medicines are reviewed at length with the patient today.  The patient does not have concerns regarding medicines.  The following changes have been made:  no change  Labs/ tests ordered today include:  No orders of the defined types were placed in this encounter.    Disposition:   FU with me in 6 months     Signed, Karinna Beadles, Wonda Cheng, MD  11/11/2014 8:57 AM    [facility] [place], [place], [place]  [address] Phone: [phone]; Fax: [phone]

## 2014-11-12 ENCOUNTER — Encounter: Payer: Self-pay | Admitting: Cardiovascular Disease

## 2014-11-12 ENCOUNTER — Ambulatory Visit (INDEPENDENT_AMBULATORY_CARE_PROVIDER_SITE_OTHER): Payer: Medicare Other | Admitting: Cardiovascular Disease

## 2014-11-12 VITALS — BP 130/80 | HR 77 | Ht 63.0 in | Wt 147.8 lb

## 2014-11-12 DIAGNOSIS — I10 Essential (primary) hypertension: Secondary | ICD-10-CM | POA: Diagnosis not present

## 2014-11-12 DIAGNOSIS — I2510 Atherosclerotic heart disease of native coronary artery without angina pectoris: Secondary | ICD-10-CM

## 2014-11-12 NOTE — Patient Instructions (Signed)
Medication Instructions:  Your physician recommends that you continue on your current medications as directed. Please refer to the Current Medication list given to you today.   Labwork: None  Testing/Procedures: None  Follow-Up: Your physician wants you to follow-up in: 6 months with Dr. Nahser.  You will receive a reminder letter in the mail two months in advance. If you don't receive a letter, please call our office to schedule the follow-up appointment.      

## 2014-12-06 ENCOUNTER — Encounter: Payer: Self-pay | Admitting: Hematology and Oncology

## 2014-12-06 ENCOUNTER — Ambulatory Visit (HOSPITAL_BASED_OUTPATIENT_CLINIC_OR_DEPARTMENT_OTHER): Payer: Medicare Other | Admitting: Hematology and Oncology

## 2014-12-06 ENCOUNTER — Other Ambulatory Visit (HOSPITAL_BASED_OUTPATIENT_CLINIC_OR_DEPARTMENT_OTHER): Payer: Medicare Other

## 2014-12-06 VITALS — BP 139/70 | HR 64 | Temp 97.7°F | Resp 17 | Ht 63.0 in | Wt 152.1 lb

## 2014-12-06 DIAGNOSIS — D72819 Decreased white blood cell count, unspecified: Secondary | ICD-10-CM

## 2014-12-06 DIAGNOSIS — D509 Iron deficiency anemia, unspecified: Secondary | ICD-10-CM

## 2014-12-06 DIAGNOSIS — D638 Anemia in other chronic diseases classified elsewhere: Secondary | ICD-10-CM

## 2014-12-06 LAB — CBC & DIFF AND RETIC
BASO%: 0.7 % (ref 0.0–2.0)
Basophils Absolute: 0 10*3/uL (ref 0.0–0.1)
EOS%: 3.3 % (ref 0.0–7.0)
Eosinophils Absolute: 0.1 10*3/uL (ref 0.0–0.5)
HCT: 34 % — ABNORMAL LOW (ref 34.8–46.6)
HGB: 11 g/dL — ABNORMAL LOW (ref 11.6–15.9)
Immature Retic Fract: 3 % (ref 1.60–10.00)
LYMPH%: 39 % (ref 14.0–49.7)
MCH: 24.2 pg — ABNORMAL LOW (ref 25.1–34.0)
MCHC: 32.4 g/dL (ref 31.5–36.0)
MCV: 74.7 fL — ABNORMAL LOW (ref 79.5–101.0)
MONO#: 0.5 10*3/uL (ref 0.1–0.9)
MONO%: 16.5 % — ABNORMAL HIGH (ref 0.0–14.0)
NEUT#: 1.1 10*3/uL — ABNORMAL LOW (ref 1.5–6.5)
NEUT%: 40.5 % (ref 38.4–76.8)
Platelets: 148 10*3/uL (ref 145–400)
RBC: 4.55 10*6/uL (ref 3.70–5.45)
RDW: 16.4 % — ABNORMAL HIGH (ref 11.2–14.5)
Retic %: 1.16 % (ref 0.70–2.10)
Retic Ct Abs: 52.78 10*3/uL (ref 33.70–90.70)
WBC: 2.7 10*3/uL — ABNORMAL LOW (ref 3.9–10.3)
lymph#: 1.1 10*3/uL (ref 0.9–3.3)

## 2014-12-06 LAB — MORPHOLOGY: PLT EST: ADEQUATE

## 2014-12-06 NOTE — Progress Notes (Signed)
[place] OFFICE PROGRESS NOTE  Marton Redwood, MD SUMMARY OF HEMATOLOGIC HISTORY:  This patient has leukopenia and anemia for a while. She had a bone marrow aspirate and biopsy done in 2007 which was normal. 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 T-cell rearrangement study ruled out LGL. The cause of leukopenia is likely related to African American heritage. INTERVAL HISTORY: [name] 81 y.o. female returns for  Further follow-up. She feels well apart from chronic constipation and intermittent abdomen pain. No recent infection. The patient denies any recent signs or symptoms of bleeding such as spontaneous epistaxis, hematuria or hematochezia.  I have reviewed the past medical history, past surgical history, social history and family history with the patient and they are unchanged from previous note.  ALLERGIES:  is allergic to cholestatin; doxycycline; prednisone; and statins.  MEDICATIONS:  Current Outpatient Prescriptions  Medication Sig Dispense Refill  . amLODipine (NORVASC) 10 MG tablet Take 1 tablet (10 mg total) by mouth daily. 90 tablet 3  . aspirin EC 81 MG tablet Take 81 mg by mouth daily.    . cholecalciferol (VITAMIN D) 1000 UNITS tablet Take 1,000 Units by mouth daily.    . cloNIDine (CATAPRES) 0.1 MG tablet Take 0.1 mg by mouth 2 (two) times daily.     Marland Kitchen docusate sodium (COLACE) 100 MG capsule Take 100 mg by mouth daily.    Marland Kitchen ipratropium (ATROVENT) 0.06 % nasal spray Place 2 sprays into both nostrils as needed.     Marland Kitchen levothyroxine (SYNTHROID, LEVOTHROID) 137 MCG tablet Take 137 mcg by mouth daily before breakfast.    . LINZESS 145 MCG CAPS capsule Take 145 mcg by mouth daily.     . Multiple Vitamin (MULTIVITAMIN WITH MINERALS) TABS Take 1 tablet by mouth daily.    . nitroGLYCERIN (NITROSTAT) 0.4 MG SL tablet Place 0.4 mg under the tongue every 5 (five) minutes as  needed.      Marland Kitchen OLANZapine (ZYPREXA) 5 MG tablet Take 1 tablet (5 mg total) by mouth at bedtime. 30 tablet 6  . polyethylene glycol (MIRALAX) packet Take 17 g by mouth 2 (two) times daily. 60 packet 6  . potassium chloride SA (K-DUR,KLOR-CON) 20 MEQ tablet Take 20 mEq by mouth daily.    . Probiotic Product (ALIGN) 4 MG CAPS Take 4 mg by mouth daily.    . ranitidine (ZANTAC) 300 MG tablet Take 300 mg by mouth daily.     Marland Kitchen telmisartan (MICARDIS) 80 MG tablet Take 1 tablet (80 mg total) by mouth daily. 90 tablet 0  . traZODone (DESYREL) 100 MG tablet Take 100 mg by mouth at bedtime. Take one hour before bedtime     No current facility-administered medications for this visit.     REVIEW OF SYSTEMS:   Constitutional: Denies fevers, chills or night sweats Eyes: Denies blurriness of vision Ears, nose, mouth, throat, and face: Denies mucositis or sore throat Respiratory: Denies cough, dyspnea or wheezes Cardiovascular: Denies palpitation, chest discomfort or lower extremity swelling Gastrointestinal:  Denies nausea, heartburn or change in bowel habits Skin: Denies abnormal skin rashes Lymphatics: Denies new lymphadenopathy or easy bruising Neurological:Denies numbness, tingling or new weaknesses Behavioral/Psych: Mood is stable, no new changes  All other systems were reviewed with the patient and are negative.  PHYSICAL EXAMINATION: ECOG PERFORMANCE STATUS: 0 - Asymptomatic  Filed Vitals:   12/06/14 1244  BP: 139/70  Pulse: 64  Temp: 97.7 F (36.5  C)  Resp: 17   Filed Weights   12/06/14 1244  Weight: 152 lb 1.6 oz (68.992 kg)    GENERAL:alert, no distress and comfortable SKIN: skin color, texture, turgor are normal, no rashes or significant lesions EYES: normal, Conjunctiva are pink and non-injected, sclera clear  NEURO: alert & oriented x 3 with fluent speech, no focal motor/sensory deficits  LABORATORY DATA:  I have reviewed the data as listed Results for orders placed or  performed in visit on 12/06/14 (from the past 48 hour(s))  CBC & Diff and Retic     Status: Abnormal   Collection Time: 12/06/14 12:32 PM  Result Value Ref Range   WBC 2.7 (L) 3.9 - 10.3 10e3/uL   NEUT# 1.1 (L) 1.5 - 6.5 10e3/uL   HGB 11.0 (L) 11.6 - 15.9 g/dL   HCT 34.0 (L) 34.8 - 46.6 %   Platelets 148 145 - 400 10e3/uL   MCV 74.7 (L) 79.5 - 101.0 fL   MCH 24.2 (L) 25.1 - 34.0 pg   MCHC 32.4 31.5 - 36.0 g/dL   RBC 4.55 3.70 - 5.45 10e6/uL   RDW 16.4 (H) 11.2 - 14.5 %   lymph# 1.1 0.9 - 3.3 10e3/uL   MONO# 0.5 0.1 - 0.9 10e3/uL   Eosinophils Absolute 0.1 0.0 - 0.5 10e3/uL   Basophils Absolute 0.0 0.0 - 0.1 10e3/uL   NEUT% 40.5 38.4 - 76.8 %   LYMPH% 39.0 14.0 - 49.7 %   MONO% 16.5 (H) 0.0 - 14.0 %   EOS% 3.3 0.0 - 7.0 %   BASO% 0.7 0.0 - 2.0 %   Retic % 1.16 0.70 - 2.10 %   Retic Ct Abs 52.78 33.70 - 90.70 10e3/uL   Immature Retic Fract 3.00 1.60 - 10.00 %    Lab Results  Component Value Date   WBC 2.7* 12/06/2014   HGB 11.0* 12/06/2014   HCT 34.0* 12/06/2014   MCV 74.7* 12/06/2014   PLT 148 12/06/2014     ASSESSMENT & PLAN:  Anemia of chronic illness She has chronic microcytic anemia which is suspected might be thalassemia trait in the background. She is not symptomatic. Recommend close observation only.     Leukopenia This is chronic in nature and she is not symptomatic.   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 She does not need long-term follow-up here at the cancer center. I recommend close observation only.     All questions were answered. The patient knows to call the clinic with any problems, questions or concerns. No barriers to learning was detected.  I spent 15 minutes counseling the patient face to face. The total time spent in the appointment was 20 minutes and more than 50% was on counseling.     [name], [name] Montella, MD 5/20/201612:56 PM

## 2014-12-06 NOTE — Assessment & Plan Note (Signed)
This is chronic in nature and she is not symptomatic.   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 She does not need long-term follow-up here at the cancer center. I recommend close observation only.

## 2014-12-06 NOTE — Assessment & Plan Note (Signed)
She has chronic microcytic anemia which is suspected might be thalassemia trait in the background. She is not symptomatic. Recommend close observation only.

## 2014-12-07 LAB — SEDIMENTATION RATE: Sed Rate: 5 mm/hr (ref 0–30)

## 2014-12-07 LAB — VITAMIN B12: Vitamin B-12: 955 pg/mL — ABNORMAL HIGH (ref 211–911)

## 2014-12-11 DIAGNOSIS — H2513 Age-related nuclear cataract, bilateral: Secondary | ICD-10-CM | POA: Diagnosis not present

## 2014-12-24 DIAGNOSIS — J040 Acute laryngitis: Secondary | ICD-10-CM | POA: Diagnosis not present

## 2014-12-24 DIAGNOSIS — J309 Allergic rhinitis, unspecified: Secondary | ICD-10-CM | POA: Diagnosis not present

## 2014-12-24 DIAGNOSIS — R05 Cough: Secondary | ICD-10-CM | POA: Diagnosis not present

## 2015-02-06 DIAGNOSIS — F22 Delusional disorders: Secondary | ICD-10-CM | POA: Diagnosis not present

## 2015-03-11 DIAGNOSIS — E785 Hyperlipidemia, unspecified: Secondary | ICD-10-CM | POA: Diagnosis not present

## 2015-03-11 DIAGNOSIS — M545 Low back pain: Secondary | ICD-10-CM | POA: Diagnosis not present

## 2015-03-11 DIAGNOSIS — I10 Essential (primary) hypertension: Secondary | ICD-10-CM | POA: Diagnosis not present

## 2015-03-11 DIAGNOSIS — F329 Major depressive disorder, single episode, unspecified: Secondary | ICD-10-CM | POA: Diagnosis not present

## 2015-03-11 DIAGNOSIS — R7302 Impaired glucose tolerance (oral): Secondary | ICD-10-CM | POA: Diagnosis not present

## 2015-03-11 DIAGNOSIS — Z6827 Body mass index (BMI) 27.0-27.9, adult: Secondary | ICD-10-CM | POA: Diagnosis not present

## 2015-03-12 DIAGNOSIS — F22 Delusional disorders: Secondary | ICD-10-CM | POA: Diagnosis not present

## 2015-03-20 ENCOUNTER — Other Ambulatory Visit: Payer: Self-pay | Admitting: Cardiovascular Disease

## 2015-03-31 DIAGNOSIS — K921 Melena: Secondary | ICD-10-CM | POA: Diagnosis not present

## 2015-03-31 DIAGNOSIS — K5900 Constipation, unspecified: Secondary | ICD-10-CM | POA: Diagnosis not present

## 2015-03-31 DIAGNOSIS — R1310 Dysphagia, unspecified: Secondary | ICD-10-CM | POA: Diagnosis not present

## 2015-05-14 ENCOUNTER — Ambulatory Visit (INDEPENDENT_AMBULATORY_CARE_PROVIDER_SITE_OTHER): Payer: Medicare Other | Admitting: Cardiovascular Disease

## 2015-05-14 ENCOUNTER — Encounter: Payer: Self-pay | Admitting: Cardiovascular Disease

## 2015-05-14 VITALS — BP 124/76 | HR 83 | Ht 63.0 in | Wt 150.8 lb

## 2015-05-14 DIAGNOSIS — I2510 Atherosclerotic heart disease of native coronary artery without angina pectoris: Secondary | ICD-10-CM | POA: Diagnosis not present

## 2015-05-14 DIAGNOSIS — Z23 Encounter for immunization: Secondary | ICD-10-CM

## 2015-05-14 NOTE — Patient Instructions (Addendum)
Medication Instructions:  Your physician recommends that you continue on your current medications as directed. Please refer to the Current Medication list given to you today.   Labwork: None Ordered   Testing/Procedures: None Ordered   Follow-Up: Your physician wants you to follow-up in: 6 months with Dr. Nahser.  You will receive a reminder letter in the mail two months in advance. If you don't receive a letter, please call our office to schedule the follow-up appointment.   If you need a refill on your cardiac medications before your next appointment, please call your pharmacy.   Thank you for choosing [facility]! Traver Meckes, RN [phone]    

## 2015-05-14 NOTE — Progress Notes (Signed)
Cardiology Office Note   Date:  05/14/2015   ID:  [name], [name] 06/11/1932, MRN [id number]  PCP:  Marton Redwood, MD  Cardiologist:   Acie Fredrickson Wonda Cheng, MD   Chief Complaint  Patient presents with  . Follow-up    htn   1. Minimal coronary artery disease 2. Hyperlipidemia 3. Chronic cough 4. Hypothyroidism 5. Status post Nissen fundoplication for acid reflux 6. Hypertension  History of Present Illness:  [name] is an elderly female with a history of chest pain with minimal coronary artery disease. She has a history of hyperlipidemia chronic cough she status post Nissen fundoplication for acid reflux. She has a history of hypertension.  She's continued to have some episodes of chest discomfort. These are fairly atypical.  Dec. 4, 2013: She has been having upper abdominal / lower chest discomfort pain. The pain is eased with NTG. She does not think the symptoms are due to soft reflux. She stopped her proton pump inhibitor last year. She was concerned about side effects including osteoporosis.  Sept. 12, 2014:  She is doing well from a cardiac standpoint. She continues to have problems with GERD. She has had a nissan fundiplication - helped the cough but now she had recurrent GERD.   She has been gaining weight. Has some numbness on the lateral aspect of her right legs.   November 14, 2013:  She was recently hospitalized for generalized weakness and coughing. She was found to have hyponatremia. She has been feeling well. She bought some compression hose but they were too long ( ended at the bend of her knee) and she could not wear them. She is going to go back to the supply store to get a shorter pair. She denies any CP or dyspnea.   Oct. 26, 2015:  [name] is doing well.  No CP or dyspnea.   November 12, 2014:   [name] is a 81 y.o. female who presents for follow up of her HTN Has occasional palpitations.  Has had some leg pain  -  especially in the morning.  Not worsened with exercise .   Oct. 26, 2016:  Doing well from a cardiac stnandpoint.    Past Medical History  Diagnosis Date  . Pneumonia   . Hypothyroidism   . Hypertension   . Hyperlipemia   . CAD (coronary artery disease)   . GERD (gastroesophageal reflux disease)   . Leukocytopenia   . Hypothyroid 09/11/2011  . Osteoporosis 09/11/2011  . Myocardial infarction ([place]) 09/11/2011    Per pt in 2004  . Arthritis   . Liver mass 09/11/2011    Per report of pt  . Kidney mass 09/11/2011    Per report of pt  . Esophageal abnormality 09/11/2011    Surgically treated per pt.  . Hypercholesterolemia 09/11/2011  . Headache(784.0)   . Cataracts, bilateral 09/11/2011    Per report of pt.  . Insect bite of ankle, left 09/11/2011    back of ankle  . Thigh cramp 09/11/2011    Left thigh x 3 nights since starting unspecified psychotropic medication  . Anemia 06/02/2012  . Mental health disorder   . Anxiety     Past Surgical History  Procedure Laterality Date  . US echocardiography  06-30-2005    Est EF 55-60%  . Cardiovascular stress test  08-15-2007    EF 59%  . Hysterotomy    . Appendectomy    . Cholecystectomy open    . Tonsillectomy    .  Knee surgery  09/11/2011  . Gallbladder surgery  09/11/2011  . Abdominal hysterectomy    . Shoulder surgery  09/11/2011    Right side     Current Outpatient Prescriptions  Medication Sig Dispense Refill  . amLODipine (NORVASC) 10 MG tablet Take 1 tablet (10 mg total) by mouth daily. 90 tablet 3  . aspirin EC 81 MG tablet Take 81 mg by mouth daily.    . cholecalciferol (VITAMIN D) 1000 UNITS tablet Take 1,000 Units by mouth daily.    . cloNIDine (CATAPRES) 0.1 MG tablet Take 0.1 mg by mouth daily.     Marland Kitchen docusate sodium (COLACE) 100 MG capsule Take 100 mg by mouth daily.    Marland Kitchen ipratropium (ATROVENT) 0.06 % nasal spray Place 2 sprays into both nostrils as needed.     Marland Kitchen levothyroxine (SYNTHROID, LEVOTHROID) 137 MCG tablet  Take 137 mcg by mouth daily before breakfast.    . LINZESS 145 MCG CAPS capsule Take 145 mcg by mouth daily.     . Multiple Vitamin (MULTIVITAMIN WITH MINERALS) TABS Take 1 tablet by mouth daily.    . nitroGLYCERIN (NITROSTAT) 0.4 MG SL tablet Place 0.4 mg under the tongue every 5 (five) minutes as needed.      Marland Kitchen OLANZapine (ZYPREXA) 5 MG tablet Take 1 tablet (5 mg total) by mouth at bedtime. 30 tablet 6  . polyethylene glycol (MIRALAX) packet Take 17 g by mouth 2 (two) times daily. 60 packet 6  . potassium chloride SA (K-DUR,KLOR-CON) 20 MEQ tablet Take 20 mEq by mouth daily.    . Probiotic Product (ALIGN) 4 MG CAPS Take 4 mg by mouth daily.    . ranitidine (ZANTAC) 300 MG tablet Take 300 mg by mouth daily.     Marland Kitchen telmisartan (MICARDIS) 80 MG tablet TAKE ONE (1) TABLET BY MOUTH EVERY DAY 90 tablet 1  . traZODone (DESYREL) 100 MG tablet Take 100 mg by mouth at bedtime. Take one hour before bedtime     No current facility-administered medications for this visit.    Allergies:   Cholestatin; Doxycycline; Prednisone; and Statins    Social History:  The patient  reports that she has never smoked. She has never used smokeless tobacco. She reports that she does not drink alcohol or use illicit drugs.   Family History:  The patient's family history includes Hypertension in her brother and sister.    ROS:  Please see the history of present illness.    Review of Systems: Constitutional:  denies fever, chills, diaphoresis, appetite change and fatigue.  HEENT: denies photophobia, eye pain, redness, hearing loss, ear pain, congestion, sore throat, rhinorrhea, sneezing, neck pain, neck stiffness and tinnitus.  Respiratory: denies SOB, DOE, cough, chest tightness, and wheezing.  Cardiovascular: denies chest pain, palpitations and leg swelling.  Gastrointestinal: denies nausea, vomiting, abdominal pain, diarrhea, constipation, blood in stool.  Genitourinary: denies dysuria, urgency, frequency,  hematuria, flank pain and difficulty urinating.  Musculoskeletal: denies  myalgias, back pain, joint swelling,  and gait problem.  She does have leg pain in the mornings  Skin: denies pallor, rash and wound.  Neurological: denies dizziness, seizures, syncope, weakness, light-headedness, numbness and headaches.   Hematological: denies adenopathy, easy bruising, personal or family bleeding history.  Psychiatric/ Behavioral: denies suicidal ideation, mood changes, confusion, nervousness, sleep disturbance and agitation.       All other systems are reviewed and negative.    PHYSICAL EXAM: VS:  BP 124/76 mmHg  Pulse 83  Ht 5\' 3"  (  1.6 m)  Wt 150 lb 12.8 oz (68.402 kg)  BMI 26.72 kg/m2  SpO2 97% , BMI Body mass index is 26.72 kg/(m^2). GEN: Well nourished, well developed, in no acute distress HEENT: normal Neck: no JVD, carotid bruits, or masses Cardiac: RRR; no murmurs, rubs, or gallops,no edema  Respiratory:  clear to auscultation bilaterally, normal work of breathing GI: soft, nontender, nondistended, + BS MS: no deformity or atrophy,  Distal foot pulses are quite good.  Skin: warm and dry, no rash Neuro:  Strength and sensation are intact Psych: normal   EKG:  EKG is ordered today. The ekg ordered today demonstrates :  NSR at 77, NS T wave changes.    Recent Labs: 12/06/2014: HGB 11.0*; Platelets 148    Lipid Panel    Component Value Date/Time   CHOL 193 05/13/2014 0757   TRIG 58.0 05/13/2014 0757   HDL 74.60 05/13/2014 0757   CHOLHDL 3 05/13/2014 0757   VLDL 11.6 05/13/2014 0757   LDLCALC 107* 05/13/2014 0757   LDLDIRECT 109.5 03/29/2011 1007      Wt Readings from Last 3 Encounters:  05/14/15 150 lb 12.8 oz (68.402 kg)  12/06/14 152 lb 1.6 oz (68.992 kg)  11/12/14 147 lb 12.8 oz (67.042 kg)      Other studies Reviewed: Additional studies/ records that were reviewed today include: . Review of the above records demonstrates:    ASSESSMENT AND PLAN:  1.  Minimal coronary artery disease - no CP. Doing well.  2. Hyperlipidemia -  Managed by her primary medical doctor  3. Chronic cough 4. Hypothyroidism 5. Status post Nissen fundoplication for acid reflux  6. Hypertension - BP is well controlled.     Current medicines are reviewed at length with the patient today.  The patient does not have concerns regarding medicines.  The following changes have been made:  no change  Labs/ tests ordered today include:  No orders of the defined types were placed in this encounter.    Disposition:   FU with me in 6 months     Signed, Roman Dubuc, Wonda Cheng, MD  05/14/2015 2:15 PM    [facility] [address], [place], [place]  [address] Phone: [phone]; Fax: [phone]

## 2015-06-05 ENCOUNTER — Other Ambulatory Visit: Payer: Self-pay

## 2015-06-05 DIAGNOSIS — Z1231 Encounter for screening mammogram for malignant neoplasm of breast: Secondary | ICD-10-CM

## 2015-06-26 ENCOUNTER — Other Ambulatory Visit: Payer: Self-pay | Admitting: Nurse Practitioner

## 2015-06-26 MED ORDER — VALSARTAN 320 MG PO TABS: 320.0000 mg | ORAL_TABLET | Freq: Every day | ORAL | Status: DC

## 2015-07-01 ENCOUNTER — Ambulatory Visit: Payer: Medicare Other | Admitting: Allergy and Immunology

## 2015-07-11 ENCOUNTER — Ambulatory Visit
Admission: RE | Admit: 2015-07-11 | Discharge: 2015-07-11 | Disposition: A | Discharge status: Home / Self Care | Payer: Medicare Other | Source: Ambulatory Visit

## 2015-07-11 DIAGNOSIS — Z1231 Encounter for screening mammogram for malignant neoplasm of breast: Secondary | ICD-10-CM

## 2015-07-29 ENCOUNTER — Ambulatory Visit: Payer: Medicare Other | Admitting: Allergy and Immunology

## 2015-08-12 DIAGNOSIS — F22 Delusional disorders: Secondary | ICD-10-CM | POA: Diagnosis not present

## 2015-08-13 ENCOUNTER — Encounter: Payer: Self-pay | Admitting: Allergy and Immunology

## 2015-08-13 ENCOUNTER — Ambulatory Visit (INDEPENDENT_AMBULATORY_CARE_PROVIDER_SITE_OTHER): Payer: Medicare Other | Admitting: Allergy and Immunology

## 2015-08-13 VITALS — BP 130/80 | HR 72 | Resp 16

## 2015-08-13 DIAGNOSIS — H1010 Acute atopic conjunctivitis, unspecified eye: Secondary | ICD-10-CM | POA: Diagnosis not present

## 2015-08-13 DIAGNOSIS — J309 Allergic rhinitis, unspecified: Secondary | ICD-10-CM | POA: Diagnosis not present

## 2015-08-13 DIAGNOSIS — J387 Other diseases of larynx: Secondary | ICD-10-CM

## 2015-08-13 DIAGNOSIS — K219 Gastro-esophageal reflux disease without esophagitis: Secondary | ICD-10-CM

## 2015-08-13 NOTE — Patient Instructions (Signed)
  1. Continue ranitidine 300 mg once a day  2. Continue Astepro 1 spray each nostril one-2 times per day if needed  3. Continue over-the-counter antihistamine if needed  4. Return to clinic in 6 months or earlier if problem

## 2015-08-13 NOTE — Progress Notes (Signed)
[facility]  Follow-up Note  Referring Provider: Marton Redwood, MD Primary Provider: Marton Redwood, MD Date of Office Visit: 08/13/2015  Subjective:   JD WALTZ is a 82 y.o. female who returns to the [facility] on 08/13/2015 in re-evaluation of the following:  HPI Comments: [name] returns to this clinic in reevaluation of her LPR and allergic and nonallergic rhinitis. Overall she's done relatively well regarding her reflux. She's not had much cough. She thinks that her throat is doing well. She has no classic reflux. [name] has been consistently using her ranitidine 300 mg once a day. Her nose is been doing quite well without significant nasal congestion and she's not had any episodes of sinusitis since I've seen her in his clinic. Astepro does dry out her nose on occasion and sometimes she needs to back off from using this medicine.   Current Outpatient Prescriptions on File Prior to Visit  Medication Sig Dispense Refill  . amLODipine (NORVASC) 10 MG tablet Take 1 tablet (10 mg total) by mouth daily. 90 tablet 3  . aspirin EC 81 MG tablet Take 81 mg by mouth daily.    . cholecalciferol (VITAMIN D) 1000 UNITS tablet Take 1,000 Units by mouth daily.    . cloNIDine (CATAPRES) 0.1 MG tablet Take 0.1 mg by mouth daily.     Marland Kitchen docusate sodium (COLACE) 100 MG capsule Take 100 mg by mouth daily.    Marland Kitchen ipratropium (ATROVENT) 0.06 % nasal spray Place 2 sprays into both nostrils as needed.     Marland Kitchen levothyroxine (SYNTHROID, LEVOTHROID) 137 MCG tablet Take 137 mcg by mouth daily before breakfast.    . LINZESS 145 MCG CAPS capsule Take 145 mcg by mouth daily.     . Multiple Vitamin (MULTIVITAMIN WITH MINERALS) TABS Take 1 tablet by mouth daily.    . nitroGLYCERIN (NITROSTAT) 0.4 MG SL tablet Place 0.4 mg under the tongue every 5 (five) minutes as needed.      Marland Kitchen OLANZapine (ZYPREXA) 5 MG tablet Take 1 tablet (5 mg total) by  mouth at bedtime. 30 tablet 6  . polyethylene glycol (MIRALAX) packet Take 17 g by mouth 2 (two) times daily. 60 packet 6  . potassium chloride SA (K-DUR,KLOR-CON) 20 MEQ tablet Take 20 mEq by mouth daily.    . Probiotic Product (ALIGN) 4 MG CAPS Take 4 mg by mouth daily.    . ranitidine (ZANTAC) 300 MG tablet Take 300 mg by mouth daily.     . valsartan (DIOVAN) 320 MG tablet Take 1 tablet (320 mg total) by mouth daily. 31 tablet 11   No current facility-administered medications on file prior to visit.    No orders of the defined types were placed in this encounter.    Past Medical History  Diagnosis Date  . Pneumonia   . Hypothyroidism   . Hypertension   . Hyperlipemia   . CAD (coronary artery disease)   . GERD (gastroesophageal reflux disease)   . Leukocytopenia   . Hypothyroid 09/11/2011  . Osteoporosis 09/11/2011  . Myocardial infarction ([place]) 09/11/2011    Per pt in 2004  . Arthritis   . Liver mass 09/11/2011    Per report of pt  . Kidney mass 09/11/2011    Per report of pt  . Esophageal abnormality 09/11/2011    Surgically treated per pt.  . Hypercholesterolemia 09/11/2011  . Headache(784.0)   . Cataracts, bilateral 09/11/2011  Per report of pt.  . Insect bite of ankle, left 09/11/2011    back of ankle  . Thigh cramp 09/11/2011    Left thigh x 3 nights since starting unspecified psychotropic medication  . Anemia 06/02/2012  . Mental health disorder   . Anxiety     Past Surgical History  Procedure Laterality Date  . US echocardiography  06-30-2005    Est EF 55-60%  . Cardiovascular stress test  08-15-2007    EF 59%  . Hysterotomy    . Appendectomy    . Cholecystectomy open    . Tonsillectomy    . Knee surgery  09/11/2011  . Gallbladder surgery  09/11/2011  . Abdominal hysterectomy    . Shoulder surgery  09/11/2011    Right side    Allergies  Allergen Reactions  . Cholestatin   . Doxycycline     REACTION: itching/rash  . Prednisone     REACTION: itching   . Statins Other (See Comments)    Muscle hurts    Review of systems negative except as noted in HPI / PMHx or noted below:  Review of Systems  Constitutional: Negative.   HENT: Negative.   Eyes: Negative.   Respiratory: Negative.   Cardiovascular: Negative.   Gastrointestinal: Negative.   Genitourinary: Negative.   Musculoskeletal: Negative.   Skin: Negative.   Neurological: Negative.   Endo/Heme/Allergies: Negative.   Psychiatric/Behavioral: Negative.      Objective:   Filed Vitals:   08/13/15 1022  BP: 130/80  Pulse: 72  Resp: 16          Physical Exam  Constitutional: She is well-developed, well-nourished, and in no distress. No distress.  Slightly raspy voice  HENT:  Head: Normocephalic.  Right Ear: Tympanic membrane, external ear and ear canal normal.  Left Ear: Tympanic membrane, external ear and ear canal normal.  Nose: Nose normal. No mucosal edema or rhinorrhea.  Mouth/Throat: Uvula is midline, oropharynx is clear and moist and mucous membranes are normal. No oropharyngeal exudate.  Eyes: Conjunctivae are normal.  Neck: Trachea normal. No tracheal tenderness present. No tracheal deviation present. No thyromegaly present.  Cardiovascular: Normal rate, regular rhythm, S1 normal, S2 normal and normal heart sounds.   No murmur heard. Pulmonary/Chest: Breath sounds normal. No stridor. No respiratory distress. She has no wheezes. She has no rales.  Musculoskeletal: She exhibits no edema.  Lymphadenopathy:       Head (right side): No tonsillar adenopathy present.       Head (left side): No tonsillar adenopathy present.    She has no cervical adenopathy.    She has no axillary adenopathy.  Neurological: She is alert. Gait normal.  Skin: No rash noted. She is not diaphoretic. No erythema. Nails show no clubbing.  Psychiatric: Mood and affect normal.    Diagnostics: None    Assessment and Plan:   1. LPRD (laryngopharyngeal reflux disease)   2. Allergic  rhinoconjunctivitis     1. Continue ranitidine 300 mg once a day  2. Continue Astepro 1 spray each nostril one-2 times per day if needed  3. Continue over-the-counter antihistamine if needed  4. Return to clinic in 6 months or earlier if problem  [name] has done very well and I see no need for changing her medical treatment at this point in time. I renewed her medications and we'll see her back in this clinic in 6 months or earlier if there is a problem.  Allena Katz, MD [facility] [place]

## 2015-08-21 DIAGNOSIS — E784 Other hyperlipidemia: Secondary | ICD-10-CM | POA: Diagnosis not present

## 2015-08-21 DIAGNOSIS — E038 Other specified hypothyroidism: Secondary | ICD-10-CM | POA: Diagnosis not present

## 2015-08-21 DIAGNOSIS — R7302 Impaired glucose tolerance (oral): Secondary | ICD-10-CM | POA: Diagnosis not present

## 2015-08-21 DIAGNOSIS — I10 Essential (primary) hypertension: Secondary | ICD-10-CM | POA: Diagnosis not present

## 2015-08-21 DIAGNOSIS — E559 Vitamin D deficiency, unspecified: Secondary | ICD-10-CM | POA: Diagnosis not present

## 2015-08-28 DIAGNOSIS — I2510 Atherosclerotic heart disease of native coronary artery without angina pectoris: Secondary | ICD-10-CM | POA: Diagnosis not present

## 2015-08-28 DIAGNOSIS — E038 Other specified hypothyroidism: Secondary | ICD-10-CM | POA: Diagnosis not present

## 2015-08-28 DIAGNOSIS — D72819 Decreased white blood cell count, unspecified: Secondary | ICD-10-CM | POA: Diagnosis not present

## 2015-08-28 DIAGNOSIS — F329 Major depressive disorder, single episode, unspecified: Secondary | ICD-10-CM | POA: Diagnosis not present

## 2015-08-28 DIAGNOSIS — D6489 Other specified anemias: Secondary | ICD-10-CM | POA: Diagnosis not present

## 2015-08-28 DIAGNOSIS — E784 Other hyperlipidemia: Secondary | ICD-10-CM | POA: Diagnosis not present

## 2015-08-28 DIAGNOSIS — I10 Essential (primary) hypertension: Secondary | ICD-10-CM | POA: Diagnosis not present

## 2015-08-28 DIAGNOSIS — Z1389 Encounter for screening for other disorder: Secondary | ICD-10-CM | POA: Diagnosis not present

## 2015-08-28 DIAGNOSIS — R7302 Impaired glucose tolerance (oral): Secondary | ICD-10-CM | POA: Diagnosis not present

## 2015-08-28 DIAGNOSIS — M859 Disorder of bone density and structure, unspecified: Secondary | ICD-10-CM | POA: Diagnosis not present

## 2015-08-28 DIAGNOSIS — Z6828 Body mass index (BMI) 28.0-28.9, adult: Secondary | ICD-10-CM | POA: Diagnosis not present

## 2015-08-28 DIAGNOSIS — Z0000 Encounter for general adult medical examination without abnormal findings: Secondary | ICD-10-CM | POA: Diagnosis not present

## 2015-09-01 ENCOUNTER — Other Ambulatory Visit: Payer: Self-pay | Admitting: Allergy and Immunology

## 2015-09-03 ENCOUNTER — Encounter: Payer: Self-pay | Admitting: Cardiovascular Disease

## 2015-09-11 ENCOUNTER — Encounter: Payer: Self-pay | Admitting: Cardiovascular Disease

## 2015-10-09 DIAGNOSIS — R300 Dysuria: Secondary | ICD-10-CM | POA: Diagnosis not present

## 2015-11-05 DIAGNOSIS — F22 Delusional disorders: Secondary | ICD-10-CM | POA: Diagnosis not present

## 2015-11-10 ENCOUNTER — Encounter: Payer: Self-pay | Admitting: Cardiovascular Disease

## 2015-11-10 ENCOUNTER — Ambulatory Visit (INDEPENDENT_AMBULATORY_CARE_PROVIDER_SITE_OTHER): Payer: Medicare Other | Admitting: Cardiovascular Disease

## 2015-11-10 VITALS — BP 110/78 | HR 66 | Ht 63.0 in | Wt 155.4 lb

## 2015-11-10 DIAGNOSIS — I10 Essential (primary) hypertension: Secondary | ICD-10-CM | POA: Diagnosis not present

## 2015-11-10 DIAGNOSIS — I2510 Atherosclerotic heart disease of native coronary artery without angina pectoris: Secondary | ICD-10-CM | POA: Diagnosis not present

## 2015-11-10 DIAGNOSIS — I5032 Chronic diastolic (congestive) heart failure: Secondary | ICD-10-CM | POA: Diagnosis not present

## 2015-11-10 MED ORDER — AMLODIPINE BESYLATE 5 MG PO TABS: 5.0000 mg | ORAL_TABLET | Freq: Every day | ORAL | Status: DC

## 2015-11-10 MED ORDER — AMLODIPINE BESYLATE 10 MG PO TABS: 5.0000 mg | ORAL_TABLET | Freq: Every day | ORAL | Status: DC

## 2015-11-10 NOTE — Patient Instructions (Signed)
Medication Instructions:  DECREASE Amlodipine (Norvasc) to 5 mg daily   Labwork: None Ordered   Testing/Procedures: Your physician has requested that you have an echocardiogram. Echocardiography is a painless test that uses sound waves to create images of your heart. It provides your doctor with information about the size and shape of your heart and how well your heart's chambers and valves are working. This procedure takes approximately one hour. There are no restrictions for this procedure.   Follow-Up: Your physician wants you to follow-up in: 6 months with Dr. Acie Fredrickson.  You will receive a reminder letter in the mail two months in advance. If you don't receive a letter, please call our office to schedule the follow-up appointment.   If you need a refill on your cardiac medications before your next appointment, please call your pharmacy.   Thank you for choosing [facility]! Christen Bame, RN [phone]

## 2015-11-10 NOTE — Progress Notes (Signed)
Cardiology Office Note   Date:  11/10/2015   ID:  [name], [name] [birth year], MRN [id number]  PCP:  [name] Redwood, MD  Cardiologist:   [name] Fredrickson Wonda Cheng, MD   Chief Complaint  Patient presents with  . Follow-up    HTN   1. Minimal coronary artery disease 2. Hyperlipidemia 3. Chronic cough 4. Hypothyroidism 5. Status post Nissen fundoplication for acid reflux 6. Hypertension  History of Present Illness:  [name] is an elderly female with a history of chest pain with minimal coronary artery disease. She has a history of hyperlipidemia chronic cough she status post Nissen fundoplication for acid reflux. She has a history of hypertension.  She's continued to have some episodes of chest discomfort. These are fairly atypical.  Dec. 4, 2013: She has been having upper abdominal / lower chest discomfort pain. The pain is eased with NTG. She does not think the symptoms are due to soft reflux. She stopped her proton pump inhibitor last year. She was concerned about side effects including osteoporosis.  Sept. 12, 2014:  She is doing well from a cardiac standpoint. She continues to have problems with GERD. She has had a nissan fundiplication - helped the cough but now she had recurrent GERD.   She has been gaining weight. Has some numbness on the lateral aspect of her right legs.   November 14, 2013:  She was recently hospitalized for generalized weakness and coughing. She was found to have hyponatremia. She has been feeling well. She bought some compression hose but they were too long ( ended at the bend of her knee) and she could not wear them. She is going to go back to the supply store to get a shorter pair. She denies any CP or dyspnea.   Oct. 26, 2015:  [name] is doing well.  No CP or dyspnea.   November 12, 2014:   [name] is a 82 y.o. female who presents for follow up of her HTN Has occasional palpitations.  Has had some leg pain  -  especially in the morning.  Not worsened with exercise .   Oct. 26, 2016:  Doing well from a cardiac stnandpoint.    November 10, 2015: Complains of being cold No CP or dyspnea.    Past Medical History  Diagnosis Date  . Pneumonia   . Hypothyroidism   . Hypertension   . Hyperlipemia   . CAD (coronary artery disease)   . GERD (gastroesophageal reflux disease)   . Leukocytopenia   . Hypothyroid 09/11/2011  . Osteoporosis 09/11/2011  . Myocardial infarction ([place]) 09/11/2011    Per pt in 2004  . Arthritis   . Liver mass 09/11/2011    Per report of pt  . Kidney mass 09/11/2011    Per report of pt  . Esophageal abnormality 09/11/2011    Surgically treated per pt.  . Hypercholesterolemia 09/11/2011  . Headache(784.0)   . Cataracts, bilateral 09/11/2011    Per report of pt.  . Insect bite of ankle, left 09/11/2011    back of ankle  . Thigh cramp 09/11/2011    Left thigh x 3 nights since starting unspecified psychotropic medication  . Anemia 06/02/2012  . Mental health disorder   . Anxiety     Past Surgical History  Procedure Laterality Date  . US echocardiography  06-30-2005    Est EF 55-60%  . Cardiovascular stress test  08-15-2007    EF 59%  . Hysterotomy    .  Appendectomy    . Cholecystectomy open    . Tonsillectomy    . Knee surgery  09/11/2011  . Gallbladder surgery  09/11/2011  . Abdominal hysterectomy    . Shoulder surgery  09/11/2011    Right side     Current Outpatient Prescriptions  Medication Sig Dispense Refill  . amLODipine (NORVASC) 10 MG tablet Take 1 tablet (10 mg total) by mouth daily. 90 tablet 3  . aspirin EC 81 MG tablet Take 81 mg by mouth daily.    . Azelastine HCl 0.15 % SOLN Place 1 spray into both nostrils daily.     . cholecalciferol (VITAMIN D) 1000 UNITS tablet Take 1,000 Units by mouth daily.    . cloNIDine (CATAPRES) 0.1 MG tablet Take 0.1 mg by mouth daily.     Marland Kitchen docusate sodium (COLACE) 100 MG capsule Take 100 mg by mouth daily.    Marland Kitchen  ipratropium (ATROVENT) 0.06 % nasal spray Place 2 sprays into both nostrils as needed for rhinitis.     Marland Kitchen levothyroxine (SYNTHROID, LEVOTHROID) 137 MCG tablet Take 137 mcg by mouth daily before breakfast.    . LINZESS 145 MCG CAPS capsule Take 145 mcg by mouth daily.     . Multiple Vitamin (MULTIVITAMIN WITH MINERALS) TABS Take 1 tablet by mouth daily.    . nitroGLYCERIN (NITROSTAT) 0.4 MG SL tablet Place 0.4 mg under the tongue every 5 (five) minutes as needed.      Marland Kitchen OLANZapine (ZYPREXA) 5 MG tablet Take 1 tablet (5 mg total) by mouth at bedtime. 30 tablet 6  . polyethylene glycol (MIRALAX) packet Take 17 g by mouth 2 (two) times daily. 60 packet 6  . potassium chloride SA (K-DUR,KLOR-CON) 20 MEQ tablet Take 20 mEq by mouth daily.    . Probiotic Product (ALIGN) 4 MG CAPS Take 4 mg by mouth daily.    . ranitidine (ZANTAC) 300 MG tablet TAKE 1 TABLET BY MOUTH DAILY 30 tablet 5  . traZODone (DESYREL) 150 MG tablet Take 50 mg by mouth at bedtime.     . valsartan (DIOVAN) 320 MG tablet Take 1 tablet (320 mg total) by mouth daily. 31 tablet 11   No current facility-administered medications for this visit.    Allergies:   Cholestatin; Doxycycline; Prednisone; and Statins    Social History:  The patient  reports that she has never smoked. She has never used smokeless tobacco. She reports that she does not drink alcohol or use illicit drugs.   Family History:  The patient's family history includes Hypertension in her brother and sister.    ROS:  Please see the history of present illness.    Review of Systems: Constitutional:  denies fever, chills, diaphoresis, appetite change and fatigue.  HEENT: denies photophobia, eye pain, redness, hearing loss, ear pain, congestion, sore throat, rhinorrhea, sneezing, neck pain, neck stiffness and tinnitus.  Respiratory: denies SOB, DOE, cough, chest tightness, and wheezing.  Cardiovascular: denies chest pain, palpitations and leg swelling.    Gastrointestinal: denies nausea, vomiting, abdominal pain, diarrhea, constipation, blood in stool.  Genitourinary: denies dysuria, urgency, frequency, hematuria, flank pain and difficulty urinating.  Musculoskeletal: denies  myalgias, back pain, joint swelling,  and gait problem.  She does have leg pain in the mornings  Skin: denies pallor, rash and wound.  Neurological: denies dizziness, seizures, syncope, weakness, light-headedness, numbness and headaches.   Hematological: denies adenopathy, easy bruising, personal or family bleeding history.  Psychiatric/ Behavioral: denies suicidal ideation, mood changes, confusion, nervousness, sleep  disturbance and agitation.       All other systems are reviewed and negative.    PHYSICAL EXAM: VS:  BP 110/78 mmHg  Pulse 66  Ht 5\' 3"  (1.6 m)  Wt 155 lb 6.4 oz (70.489 kg)  BMI 27.53 kg/m2 , BMI Body mass index is 27.53 kg/(m^2). GEN: Well nourished, well developed, in no acute distress HEENT: normal Neck: no JVD, carotid bruits, or masses Cardiac: RRR; 2/6 systolic  Murmur.   No rubs, or gallops,no edema  Respiratory:  clear to auscultation bilaterally, normal work of breathing GI: soft, nontender, nondistended, + BS MS: no deformity or atrophy,  Distal foot pulses are quite good.  Skin: warm and dry, no rash Neuro:  Strength and sensation are intact Psych: normal   EKG:  EKG is ordered today. The ekg ordered today demonstrates :  NSR at 66, normal    Recent Labs: 12/06/2014: HGB 11.0*; Platelets 148    Lipid Panel    Component Value Date/Time   CHOL 193 05/13/2014 0757   TRIG 58.0 05/13/2014 0757   HDL 74.60 05/13/2014 0757   CHOLHDL 3 05/13/2014 0757   VLDL 11.6 05/13/2014 0757   LDLCALC 107* 05/13/2014 0757   LDLDIRECT 109.5 03/29/2011 1007      Wt Readings from Last 3 Encounters:  11/10/15 155 lb 6.4 oz (70.489 kg)  05/14/15 150 lb 12.8 oz (68.402 kg)  12/06/14 152 lb 1.6 oz (68.992 kg)      Other studies  Reviewed: Additional studies/ records that were reviewed today include: . Review of the above records demonstrates:    ASSESSMENT AND PLAN:  1. Minimal coronary artery disease - no CP. Doing well.  2. Hyperlipidemia -  Managed by her primary medical doctor  3. Chronic cough 4. Hypothyroidism 5. Status post Nissen fundoplication for acid reflux  6. Hypertension - BP is well controlled.   Has some leg edema on occasion.   Will reduce the amlodipine to 5 mg a day .  She will check her BP once a week .   Current medicines are reviewed at length with the patient today.  The patient does not have concerns regarding medicines.  The following changes have been made:  no change  Labs/ tests ordered today include:  No orders of the defined types were placed in this encounter.    Disposition:   FU with me in 6 months     Signed, Daronte Shostak, Wonda Cheng, MD  11/10/2015 2:14 PM    [facility] [place], [place], [place]  [address] Phone: [phone]; Fax: [phone]

## 2015-11-25 ENCOUNTER — Other Ambulatory Visit: Payer: Self-pay

## 2015-11-25 ENCOUNTER — Ambulatory Visit (HOSPITAL_COMMUNITY): Payer: Medicare Other | Attending: Cardiovascular Disease

## 2015-11-25 DIAGNOSIS — I5032 Chronic diastolic (congestive) heart failure: Secondary | ICD-10-CM

## 2015-11-25 DIAGNOSIS — E785 Hyperlipidemia, unspecified: Secondary | ICD-10-CM | POA: Insufficient documentation

## 2015-11-25 DIAGNOSIS — I509 Heart failure, unspecified: Secondary | ICD-10-CM | POA: Diagnosis present

## 2015-11-25 DIAGNOSIS — I340 Nonrheumatic mitral (valve) insufficiency: Secondary | ICD-10-CM | POA: Insufficient documentation

## 2015-11-25 DIAGNOSIS — I110 Hypertensive heart disease with heart failure: Secondary | ICD-10-CM | POA: Insufficient documentation

## 2015-11-25 HISTORY — PX: US ECHOCARDIOGRAPHY: HXRAD669

## 2015-11-26 ENCOUNTER — Other Ambulatory Visit (HOSPITAL_COMMUNITY): Payer: Self-pay | Admitting: Psychiatry

## 2015-12-08 ENCOUNTER — Observation Stay (HOSPITAL_COMMUNITY)
Admission: EM | Admit: 2015-12-08 | Discharge: 2015-12-11 | Disposition: A | Discharge status: Home / Self Care | Payer: Medicare Other | Attending: Cardiovascular Disease | Admitting: Cardiovascular Disease

## 2015-12-08 ENCOUNTER — Emergency Department (HOSPITAL_COMMUNITY): Payer: Medicare Other

## 2015-12-08 ENCOUNTER — Encounter (HOSPITAL_COMMUNITY): Payer: Self-pay | Admitting: Emergency Medicine

## 2015-12-08 DIAGNOSIS — I252 Old myocardial infarction: Secondary | ICD-10-CM | POA: Diagnosis not present

## 2015-12-08 DIAGNOSIS — D649 Anemia, unspecified: Secondary | ICD-10-CM | POA: Insufficient documentation

## 2015-12-08 DIAGNOSIS — D638 Anemia in other chronic diseases classified elsewhere: Secondary | ICD-10-CM | POA: Diagnosis present

## 2015-12-08 DIAGNOSIS — E785 Hyperlipidemia, unspecified: Secondary | ICD-10-CM | POA: Diagnosis not present

## 2015-12-08 DIAGNOSIS — I10 Essential (primary) hypertension: Secondary | ICD-10-CM | POA: Diagnosis not present

## 2015-12-08 DIAGNOSIS — R079 Chest pain, unspecified: Secondary | ICD-10-CM | POA: Diagnosis not present

## 2015-12-08 DIAGNOSIS — I2510 Atherosclerotic heart disease of native coronary artery without angina pectoris: Principal | ICD-10-CM | POA: Diagnosis present

## 2015-12-08 DIAGNOSIS — R072 Precordial pain: Secondary | ICD-10-CM | POA: Insufficient documentation

## 2015-12-08 DIAGNOSIS — Z7982 Long term (current) use of aspirin: Secondary | ICD-10-CM | POA: Insufficient documentation

## 2015-12-08 DIAGNOSIS — K219 Gastro-esophageal reflux disease without esophagitis: Secondary | ICD-10-CM | POA: Diagnosis not present

## 2015-12-08 DIAGNOSIS — E039 Hypothyroidism, unspecified: Secondary | ICD-10-CM | POA: Diagnosis present

## 2015-12-08 DIAGNOSIS — R0789 Other chest pain: Secondary | ICD-10-CM | POA: Diagnosis not present

## 2015-12-08 LAB — COMPREHENSIVE METABOLIC PANEL
ALT: 14 U/L (ref 14–54)
AST: 22 U/L (ref 15–41)
Albumin: 3.3 g/dL — ABNORMAL LOW (ref 3.5–5.0)
Alkaline Phosphatase: 55 U/L (ref 38–126)
Anion gap: 7 (ref 5–15)
BUN: 10 mg/dL (ref 6–20)
CO2: 26 mmol/L (ref 22–32)
Calcium: 9.1 mg/dL (ref 8.9–10.3)
Chloride: 105 mmol/L (ref 101–111)
Creatinine, Ser: 1.02 mg/dL — ABNORMAL HIGH (ref 0.44–1.00)
GFR calc Af Amer: 58 mL/min — ABNORMAL LOW (ref >60)
GFR calc non Af Amer: 50 mL/min — ABNORMAL LOW (ref >60)
Glucose, Bld: 77 mg/dL (ref 65–99)
Potassium: 4.2 mmol/L (ref 3.5–5.1)
Sodium: 138 mmol/L (ref 135–145)
Total Bilirubin: 0.5 mg/dL (ref 0.3–1.2)
Total Protein: 5.9 g/dL — ABNORMAL LOW (ref 6.5–8.1)

## 2015-12-08 LAB — CBC
HCT: 34.2 % — ABNORMAL LOW (ref 36.0–46.0)
Hemoglobin: 10.7 g/dL — ABNORMAL LOW (ref 12.0–15.0)
MCH: 23.1 pg — ABNORMAL LOW (ref 26.0–34.0)
MCHC: 31.3 g/dL (ref 30.0–36.0)
MCV: 73.9 fL — ABNORMAL LOW (ref 78.0–100.0)
Platelets: 133 10*3/uL — ABNORMAL LOW (ref 150–400)
RBC: 4.63 MIL/uL (ref 3.87–5.11)
RDW: 15.3 % (ref 11.5–15.5)
WBC: 2.1 10*3/uL — ABNORMAL LOW (ref 4.0–10.5)

## 2015-12-08 LAB — I-STAT TROPONIN, ED: Troponin i, poc: 0 ng/mL (ref 0.00–0.08)

## 2015-12-08 LAB — TROPONIN I: Troponin I: <0.03 ng/mL (ref <0.031)

## 2015-12-08 MED ORDER — LINACLOTIDE 145 MCG PO CAPS
145.0000 ug | ORAL_CAPSULE | Freq: Every day | ORAL | Status: DC
  Administered 2015-12-09 – 2015-12-11 (×2): 145 ug via ORAL
  Filled 2015-12-08 (×2): qty 1

## 2015-12-08 MED ORDER — ASPIRIN EC 81 MG PO TBEC
81.0000 mg | DELAYED_RELEASE_TABLET | Freq: Every day | ORAL | Status: DC
  Administered 2015-12-09 – 2015-12-11 (×3): 81 mg via ORAL
  Filled 2015-12-08 (×3): qty 1

## 2015-12-08 MED ORDER — FAMOTIDINE 20 MG PO TABS
20.0000 mg | ORAL_TABLET | Freq: Every day | ORAL | Status: DC
  Administered 2015-12-09 – 2015-12-11 (×3): 20 mg via ORAL
  Filled 2015-12-08 (×3): qty 1

## 2015-12-08 MED ORDER — SODIUM CHLORIDE 0.9 % IV BOLUS (SEPSIS)
500.0000 mL | Freq: Once | INTRAVENOUS | Status: AC
  Administered 2015-12-08: 500 mL via INTRAVENOUS

## 2015-12-08 MED ORDER — OLANZAPINE 5 MG PO TABS
5.0000 mg | ORAL_TABLET | Freq: Every day | ORAL | Status: DC
  Administered 2015-12-09 – 2015-12-10 (×2): 5 mg via ORAL
  Filled 2015-12-08 (×4): qty 1

## 2015-12-08 MED ORDER — NITROGLYCERIN 0.4 MG SL SUBL: 0.4000 mg | SUBLINGUAL_TABLET | SUBLINGUAL | Status: DC | PRN

## 2015-12-08 MED ORDER — ASPIRIN EC 81 MG PO TBEC: 81.0000 mg | DELAYED_RELEASE_TABLET | Freq: Every day | ORAL | Status: DC

## 2015-12-08 MED ORDER — NITROGLYCERIN 0.4 MG SL SUBL
0.4000 mg | SUBLINGUAL_TABLET | SUBLINGUAL | Status: DC | PRN
  Administered 2015-12-09 (×2): 0.4 mg via SUBLINGUAL
  Filled 2015-12-08 (×2): qty 1

## 2015-12-08 MED ORDER — ADULT MULTIVITAMIN W/MINERALS CH
1.0000 | ORAL_TABLET | Freq: Every day | ORAL | Status: DC
  Administered 2015-12-09 – 2015-12-11 (×3): 1 via ORAL
  Filled 2015-12-08 (×3): qty 1

## 2015-12-08 MED ORDER — HEPARIN SODIUM (PORCINE) 5000 UNIT/ML IJ SOLN
5000.0000 [IU] | Freq: Three times a day (TID) | INTRAMUSCULAR | Status: DC
  Administered 2015-12-08 – 2015-12-11 (×4): 5000 [IU] via SUBCUTANEOUS
  Filled 2015-12-08 (×4): qty 1

## 2015-12-08 MED ORDER — TRAZODONE HCL 50 MG PO TABS
150.0000 mg | ORAL_TABLET | Freq: Every day | ORAL | Status: DC
  Administered 2015-12-08: 150 mg via ORAL
  Filled 2015-12-08: qty 1

## 2015-12-08 MED ORDER — ASPIRIN 300 MG RE SUPP: 300.0000 mg | RECTAL | Status: AC

## 2015-12-08 MED ORDER — IRBESARTAN 150 MG PO TABS
300.0000 mg | ORAL_TABLET | Freq: Every day | ORAL | Status: DC
  Administered 2015-12-09 – 2015-12-11 (×3): 300 mg via ORAL
  Filled 2015-12-08 (×3): qty 2

## 2015-12-08 MED ORDER — LEVOTHYROXINE SODIUM 25 MCG PO TABS
137.0000 ug | ORAL_TABLET | Freq: Every day | ORAL | Status: DC
  Administered 2015-12-09 – 2015-12-11 (×3): 137 ug via ORAL
  Filled 2015-12-08 (×3): qty 1

## 2015-12-08 MED ORDER — AZELASTINE HCL 0.1 % NA SOLN
1.0000 | Freq: Every day | NASAL | Status: DC
  Filled 2015-12-08: qty 30

## 2015-12-08 MED ORDER — GI COCKTAIL ~~LOC~~
30.0000 mL | Freq: Once | ORAL | Status: AC
  Administered 2015-12-08: 30 mL via ORAL
  Filled 2015-12-08: qty 30

## 2015-12-08 MED ORDER — AMLODIPINE BESYLATE 5 MG PO TABS
5.0000 mg | ORAL_TABLET | Freq: Every day | ORAL | Status: DC
  Administered 2015-12-09 – 2015-12-11 (×3): 5 mg via ORAL
  Filled 2015-12-08 (×3): qty 1

## 2015-12-08 MED ORDER — MORPHINE SULFATE (PF) 4 MG/ML IV SOLN
4.0000 mg | Freq: Once | INTRAVENOUS | Status: AC
  Administered 2015-12-08: 4 mg via INTRAVENOUS
  Filled 2015-12-08: qty 1

## 2015-12-08 MED ORDER — VITAMIN D 1000 UNITS PO TABS
3000.0000 [IU] | ORAL_TABLET | Freq: Every day | ORAL | Status: DC
Start: 2015-12-09 — End: 2015-12-11
  Administered 2015-12-09 – 2015-12-11 (×3): 3000 [IU] via ORAL
  Filled 2015-12-08 (×3): qty 3

## 2015-12-08 MED ORDER — ASPIRIN 81 MG PO CHEW: 324.0000 mg | CHEWABLE_TABLET | ORAL | Status: AC

## 2015-12-08 MED ORDER — POLYETHYLENE GLYCOL 3350 17 G PO PACK
17.0000 g | PACK | Freq: Two times a day (BID) | ORAL | Status: DC
Start: 2015-12-08 — End: 2015-12-11
  Administered 2015-12-08 – 2015-12-10 (×5): 17 g via ORAL
  Filled 2015-12-08 (×5): qty 1

## 2015-12-08 MED ORDER — DOCUSATE SODIUM 100 MG PO CAPS: 100.0000 mg | ORAL_CAPSULE | Freq: Every day | ORAL | Status: DC | PRN

## 2015-12-08 MED ORDER — CLONIDINE HCL 0.1 MG PO TABS
0.1000 mg | ORAL_TABLET | Freq: Every day | ORAL | Status: DC
  Administered 2015-12-09 – 2015-12-11 (×3): 0.1 mg via ORAL
  Filled 2015-12-08 (×3): qty 1

## 2015-12-08 MED ORDER — ONDANSETRON HCL 4 MG/2ML IJ SOLN
4.0000 mg | Freq: Four times a day (QID) | INTRAMUSCULAR | Status: DC | PRN
  Administered 2015-12-10: 4 mg via INTRAVENOUS
  Filled 2015-12-08: qty 2

## 2015-12-08 MED ORDER — POTASSIUM CHLORIDE CRYS ER 20 MEQ PO TBCR
20.0000 meq | EXTENDED_RELEASE_TABLET | Freq: Every day | ORAL | Status: DC
  Administered 2015-12-09 – 2015-12-11 (×3): 20 meq via ORAL
  Filled 2015-12-08 (×3): qty 1

## 2015-12-08 MED ORDER — ACETAMINOPHEN 325 MG PO TABS
650.0000 mg | ORAL_TABLET | ORAL | Status: DC | PRN
  Administered 2015-12-08 – 2015-12-10 (×5): 650 mg via ORAL
  Filled 2015-12-08 (×5): qty 2

## 2015-12-08 MED ORDER — RISAQUAD PO CAPS
1.0000 | ORAL_CAPSULE | Freq: Every day | ORAL | Status: DC
  Administered 2015-12-09 – 2015-12-11 (×3): 1 via ORAL
  Filled 2015-12-08 (×3): qty 1

## 2015-12-08 NOTE — ED Provider Notes (Signed)
CSN: [id number]     Arrival date & time 12/08/15  1217 History   First MD Initiated Contact with Patient 12/08/15 1319     Chief Complaint  Patient presents with  . Chest Pain     (Consider location/radiation/quality/duration/timing/severity/associated sxs/prior Treatment) The history is provided by the patient.     [name] is A 82 year old female history of CAD, hypertension, hyperlipidemia, GERD with chronic cough, she presents emergency department via EMS for evaluation of sudden onset sharp chest pain that began this morning at approximately 11 AM, while she was at rest reading the newspaper. She states that she had just finished eating breakfast and sat down when she had sharp, severe pain that began in her left jaw and throat radiated to her chest. She states that she took liquid antiacid without any improvement and then proceeded to take 3 nitroglycerin, also without any improvement of her pain. She called EMS who instructed her to take 4 baby aspirin, they then brought her to the ER for further evaluation.  In the ER she presents with continued chest pain that is somewhat improved. She states that it is a tightness in her central chest without alleviating or aggravating factors including no change with position, palpation or deep inspiration. She states she has chronic cough that is unchanged from her baseline, denies any fever, chills or sweats.  She states that she frequently has chest pain and she usually will treat with her liquid antiacid and nitroglycerin however today did not improve. She states that today she did not have any associated symptoms with her chest pain including no diaphoresis, nausea, vomiting, near syncope. She denies any lower extremity edema, palpitations, recent illness. She states that when she had her heart attack several years ago she did not have any associated symptoms then either.  She recently saw Dr. Acie Fredrickson, had ECHO performed, and has otherwise been  doing well.     Past Medical History  Diagnosis Date  . Pneumonia   . Hypothyroidism   . Hypertension   . Hyperlipemia   . CAD (coronary artery disease)   . GERD (gastroesophageal reflux disease)   . Leukocytopenia   . Hypothyroid 09/11/2011  . Osteoporosis 09/11/2011  . Myocardial infarction ([place]) 09/11/2011    Per pt in 2005 (small vessel)  . Arthritis   . Liver mass 09/11/2011    Per report of pt  . Kidney mass 09/11/2011    Per report of pt  . Esophageal abnormality 09/11/2011    Surgically treated per pt.  . Hypercholesterolemia 09/11/2011  . Headache(784.0)   . Cataracts, bilateral 09/11/2011    Per report of pt.  . Insect bite of ankle, left 09/11/2011    back of ankle  . Thigh cramp 09/11/2011    Left thigh x 3 nights since starting unspecified psychotropic medication  . Anemia 06/02/2012  . Mental health disorder   . Anxiety    Past Surgical History  Procedure Laterality Date  . US echocardiography  11/25/2015    Est EF 55-60%  . Cardiovascular stress test  08-15-2007    EF 59%  . Hysterotomy    . Appendectomy    . Cholecystectomy open    . Tonsillectomy    . Knee surgery  09/11/2011  . Gallbladder surgery  09/11/2011  . Abdominal hysterectomy    . Shoulder surgery  09/11/2011    Right side   Family History  Problem Relation Age of Onset  . Hypertension Sister   .  Hypertension Brother    Social History  Substance Use Topics  . Smoking status: Never Smoker   . Smokeless tobacco: Never Used  . Alcohol Use: No   OB History    No data available     Review of Systems  All other systems reviewed and are negative.     Allergies  Statins; Doxycycline; and Prednisone  Home Medications   Prior to Admission medications   Medication Sig Start Date End Date Taking? Authorizing Provider  amLODipine (NORVASC) 5 MG tablet Take 1 tablet (5 mg total) by mouth daily. 11/10/15  Yes Thayer Headings, MD  aspirin EC 81 MG tablet Take 81 mg by mouth daily.   Yes  Historical Provider, MD  Azelastine HCl 0.15 % SOLN Place 1 spray into both nostrils daily.  06/19/15  Yes Historical Provider, MD  Cholecalciferol 3000 units TABS Take 3,000 Units by mouth daily.   Yes Historical Provider, MD  cloNIDine (CATAPRES) 0.1 MG tablet Take 0.1 mg by mouth daily.    Yes Historical Provider, MD  docusate sodium (COLACE) 100 MG capsule Take 100 mg by mouth daily as needed for moderate constipation.    Yes Historical Provider, MD  ipratropium (ATROVENT) 0.06 % nasal spray Place 2 sprays into both nostrils as needed for rhinitis.  04/29/14  Yes Historical Provider, MD  levothyroxine (SYNTHROID, LEVOTHROID) 137 MCG tablet Take 137 mcg by mouth daily before breakfast.   Yes Historical Provider, MD  LINZESS 145 MCG CAPS capsule Take 145 mcg by mouth daily.  11/22/13  Yes Historical Provider, MD  Multiple Vitamin (MULTIVITAMIN WITH MINERALS) TABS Take 1 tablet by mouth daily.   Yes Historical Provider, MD  nitroGLYCERIN (NITROSTAT) 0.4 MG SL tablet Place 0.4 mg under the tongue every 5 (five) minutes as needed.     Yes Historical Provider, MD  OLANZapine (ZYPREXA) 5 MG tablet Take 1 tablet (5 mg total) by mouth at bedtime. 10/04/13  Yes Marton Redwood, MD  polyethylene glycol [facility]) packet Take 17 g by mouth 2 (two) times daily. 10/04/13  Yes Marton Redwood, MD  potassium chloride SA (K-DUR,KLOR-CON) 20 MEQ tablet Take 20 mEq by mouth daily.   Yes Historical Provider, MD  Probiotic Product (ALIGN) 4 MG CAPS Take 4 mg by mouth daily.   Yes Historical Provider, MD  ranitidine (ZANTAC) 300 MG tablet TAKE 1 TABLET BY MOUTH DAILY 09/01/15  Yes Jiles Prows, MD  traZODone (DESYREL) 150 MG tablet Take 150 mg by mouth at bedtime.  06/19/15  Yes Historical Provider, MD  valsartan (DIOVAN) 320 MG tablet Take 1 tablet (320 mg total) by mouth daily. 06/26/15  Yes Thayer Headings, MD   BP 146/77 mmHg  Pulse 55  Temp(Src) 97.9 F (36.6 C) (Oral)  Resp 17  Wt 69.4 kg  SpO2 98% Physical Exam   Constitutional: She is oriented to person, place, and time. She appears well-developed and well-nourished. No distress.  Well-appearing elderly female, in NAD  HENT:  Head: Normocephalic and atraumatic.  Nose: Nose normal.  Mouth/Throat: Oropharynx is clear and moist. No oropharyngeal exudate.  Eyes: Conjunctivae and EOM are normal. Pupils are equal, round, and reactive to light. Right eye exhibits no discharge. Left eye exhibits no discharge. No scleral icterus.  Neck: Normal range of motion. No JVD present. No tracheal deviation present. No thyromegaly present.  Cardiovascular: Normal rate, regular rhythm, normal heart sounds and intact distal pulses.  Exam reveals no gallop and no friction rub.   No murmur  heard. No lower extremity edema, symmetrical radial pulses 2+  Pulmonary/Chest: Effort normal and breath sounds normal. No respiratory distress. She has no wheezes. She has no rales. She exhibits no tenderness.  Abdominal: Soft. Bowel sounds are normal. She exhibits no distension and no mass. There is no tenderness. There is no rebound and no guarding.  Musculoskeletal: Normal range of motion. She exhibits no edema or tenderness.  Lymphadenopathy:    She has no cervical adenopathy.  Neurological: She is alert and oriented to person, place, and time. She has normal reflexes. No cranial nerve deficit. She exhibits normal muscle tone. Coordination normal.  Skin: Skin is warm and dry. No rash noted. She is not diaphoretic. No erythema. No pallor.  Psychiatric: She has a normal mood and affect. Her behavior is normal. Judgment and thought content normal.  Nursing note and vitals reviewed.   ED Course  Procedures (including critical care time) Labs Review Labs Reviewed  CBC - Abnormal; Notable for the following:    WBC 2.1 (*)    Hemoglobin 10.7 (*)    HCT 34.2 (*)    MCV 73.9 (*)    MCH 23.1 (*)    Platelets 133 (*)    All other components within normal limits  COMPREHENSIVE  METABOLIC PANEL - Abnormal; Notable for the following:    Creatinine, Ser 1.02 (*)    Total Protein 5.9 (*)    Albumin 3.3 (*)    GFR calc non Af Amer 50 (*)    GFR calc Af Amer 58 (*)    All other components within normal limits  Randolm Idol, ED    Imaging Review Dg Chest 2 View  12/08/2015  CLINICAL DATA:  Chest pain. EXAM: CHEST  2 VIEW COMPARISON:  October 02, 2013. FINDINGS: Stable cardiomediastinal silhouette. No pneumothorax or pleural effusion is noted. Multilevel degenerative disc disease is noted in the thoracic spine. Left lung is clear. Mild right basilar subsegmental atelectasis is noted. IMPRESSION: Mild right basilar subsegmental atelectasis. Electronically Signed   By: Marijo Conception, M.D.   On: 12/08/2015 14:23   I have personally reviewed and evaluated these images and lab results as part of my medical decision-making.   EKG Interpretation   Date/Time:  Monday Dec 08 2015 12:22:41 EDT Ventricular Rate:  55 PR Interval:  175 QRS Duration: 101 QT Interval:  441 QTC Calculation: 422 R Axis:   59 Text Interpretation:  Sinus rhythm Borderline T wave abnormalities Minimal  ST elevation, anterior leads Confirmed by ZAMMIT  MD, JOSEPH [phone]) on  12/08/2015 1:50:17 PM      MDM   82 year old female presents the emergency department with left jaw and left neck and left-sided chest pain began approximately 11 AM this morning. Worked up for possible ACS.  Patient is well-appearing and in no acute distress, continues to complain of persistent chest pressure.  Lab work is consistent with her baseline with out acute abnormalities. Chest x-ray pertinent for mild right basilar subsegmental atelectasis, initial troponin negative, EKG sinus rhythm with borderline T-wave abnormalities.  Case discussed with attending physician, Dr. Roderic Palau, cards consulted, will dispo.  Admited for CP by cardiology- attending MD Dr. Gwenlyn Found    Final diagnoses:  Chest pain, unspecified chest pain  type       Delsa Grana, PA-C 12/11/15 0126  Milton Ferguson, MD 12/11/15 [phone]

## 2015-12-08 NOTE — H&P (Signed)
History & Physical    Patient ID: [name] MRN: [id number], DOB/AGE: 80-Jan-1933   Admit date: 12/08/2015   Primary Physician: Marton Redwood, MD Primary Cardiologist: Dr. Cathie Olden  Patient Profile    82 yo female with PMH of minimal CAD/HLD/HTN/Hypothyroidism/s/p nissen fundoplication Acid Reflex who presented to [facility] ED with complaint of sharp chest pain.   Past Medical History    Past Medical History  Diagnosis Date  . Pneumonia   . Hypothyroidism   . Hypertension   . Hyperlipemia   . CAD (coronary artery disease)   . GERD (gastroesophageal reflux disease)   . Leukocytopenia   . Hypothyroid 09/11/2011  . Osteoporosis 09/11/2011  . Myocardial infarction ([place]) 09/11/2011    Per pt in 2005 (small vessel)  . Arthritis   . Liver mass 09/11/2011    Per report of pt  . Kidney mass 09/11/2011    Per report of pt  . Esophageal abnormality 09/11/2011    Surgically treated per pt.  . Hypercholesterolemia 09/11/2011  . Headache(784.0)   . Cataracts, bilateral 09/11/2011    Per report of pt.  . Insect bite of ankle, left 09/11/2011    back of ankle  . Thigh cramp 09/11/2011    Left thigh x 3 nights since starting unspecified psychotropic medication  . Anemia 06/02/2012  . Mental health disorder   . Anxiety     Past Surgical History  Procedure Laterality Date  . US echocardiography  11/25/2015    Est EF 55-60%  . Cardiovascular stress test  08-15-2007    EF 59%  . Hysterotomy    . Appendectomy    . Cholecystectomy open    . Tonsillectomy    . Knee surgery  09/11/2011  . Gallbladder surgery  09/11/2011  . Abdominal hysterectomy    . Shoulder surgery  09/11/2011    Right side     Allergies  Allergies  Allergen Reactions  . Statins Other (See Comments)    Muscle hurts  . Doxycycline Itching and Rash  . Prednisone Itching    REACTION: itching    History of Present Illness    [name] is an 82 yo female patient of Dr. Cathie Olden with a PMH of minimal CAD  (s/p LHC 2005 small inferoapical MI)/HLD/HTN/Hypothyroidism/s/p nissen fundoplication Acid Reflex who was last seen in the office 11/10/2015 and noted to be in  Her usual state of health with her Norvasc decreased to 5mg  daily related to LEE. Her last LHC was back in 2007 where she was noted to have relatively normal coronary arteries and recommendations given to continue with medical therapy. 2D Echo on 11/25/2015 showed EF 55-60% G1DD with NWMA.   Reports intermittent episodes of chest pain that she normally associates with her GERD, that start with centralized sharp chest pain with radiation up into her jaws. Normally able to relieve this pain with a liquid antacid. States she had one of these episodes this morning around 11am, had eaten breakfast about 9am and was sitting reading the newspaper when she developed a sharp/burning sensation in her chest that radiated up into her jaws. Proceeded to take an antacid, but the pain persisted. She took a total of 3SL nitro, but did not have any relief. Of note she believes they may be expired. She called 911 since the pain did not alleviate, was informed to take 324 ASA. Denies any dyspnea, light-headedness or diaphoresis. Does reports some occasional palpitations.   On arrival to the ED she  was given 2mg  of morphine which helped bring the pain from a 10/10 to a 7/10. EKG shows SR Rate-55 with no acute ST/T wave abnormalities. Labs showed a neg POC trop, Hgb 10.7, Cr 1.02. Chest x-ray showed some mild right basilar atelectasis.   Cardiology has been called for admission related to patient's complaints of chest pain.   Home Medications    Prior to Admission medications   Medication Sig Start Date End Date Taking? Authorizing Provider  amLODipine (NORVASC) 5 MG tablet Take 1 tablet (5 mg total) by mouth daily. 11/10/15   Thayer Headings, MD  aspirin EC 81 MG tablet Take 81 mg by mouth daily.    Historical Provider, MD  Azelastine HCl 0.15 % SOLN Place 1 spray  into both nostrils daily.  06/19/15   Historical Provider, MD  cholecalciferol (VITAMIN D) 1000 UNITS tablet Take 1,000 Units by mouth daily.    Historical Provider, MD  cloNIDine (CATAPRES) 0.1 MG tablet Take 0.1 mg by mouth daily.     Historical Provider, MD  docusate sodium (COLACE) 100 MG capsule Take 100 mg by mouth daily.    Historical Provider, MD  ipratropium (ATROVENT) 0.06 % nasal spray Place 2 sprays into both nostrils as needed for rhinitis.  04/29/14   Historical Provider, MD  levothyroxine (SYNTHROID, LEVOTHROID) 137 MCG tablet Take 137 mcg by mouth daily before breakfast.    Historical Provider, MD  LINZESS 145 MCG CAPS capsule Take 145 mcg by mouth daily.  11/22/13   Historical Provider, MD  Multiple Vitamin (MULTIVITAMIN WITH MINERALS) TABS Take 1 tablet by mouth daily.    Historical Provider, MD  nitroGLYCERIN (NITROSTAT) 0.4 MG SL tablet Place 0.4 mg under the tongue every 5 (five) minutes as needed.      Historical Provider, MD  OLANZapine (ZYPREXA) 5 MG tablet Take 1 tablet (5 mg total) by mouth at bedtime. 10/04/13   Marton Redwood, MD  polyethylene glycol [facility]) packet Take 17 g by mouth 2 (two) times daily. 10/04/13   Marton Redwood, MD  potassium chloride SA (K-DUR,KLOR-CON) 20 MEQ tablet Take 20 mEq by mouth daily.    Historical Provider, MD  Probiotic Product (ALIGN) 4 MG CAPS Take 4 mg by mouth daily.    Historical Provider, MD  ranitidine (ZANTAC) 300 MG tablet TAKE 1 TABLET BY MOUTH DAILY 09/01/15   Jiles Prows, MD  traZODone (DESYREL) 150 MG tablet Take 50 mg by mouth at bedtime.  06/19/15   Historical Provider, MD  valsartan (DIOVAN) 320 MG tablet Take 1 tablet (320 mg total) by mouth daily. 06/26/15   Thayer Headings, MD    Family History    Family History  Problem Relation Age of Onset  . Hypertension Sister   . Hypertension Brother     Social History    Social History   Social History  . Marital Status: Single    Spouse Name: N/A  . Number of Children: N/A   . Years of Education: N/A   Occupational History  . Not on file.   Social History Main Topics  . Smoking status: Never Smoker   . Smokeless tobacco: Never Used  . Alcohol Use: No  . Drug Use: No  . Sexual Activity: Not Currently   Other Topics Concern  . Not on file   Social History Narrative     Review of Systems    General:  No chills, fever, night sweats or weight changes.  Cardiovascular:  + chest pain,  dyspnea on exertion, + edema, orthopnea, + palpitations, paroxysmal nocturnal dyspnea. Dermatological: No rash, lesions/masses Respiratory: No cough, dyspnea Urologic: No hematuria, dysuria Abdominal:   No nausea, vomiting, diarrhea, bright red blood per rectum, melena, or hematemesis, + acid reflux Neurologic:  No visual changes, wkns, changes in mental status. All other systems reviewed and are otherwise negative except as noted above.  Physical Exam    Blood pressure 156/83, pulse 58, temperature 97.9 F (36.6 C), temperature source Oral, resp. rate 18, weight 153 lb (69.4 kg), SpO2 99 %.  General: Pleasant older female, NAD Psych: Normal affect. Neuro: Alert and oriented X 3. Moves all extremities spontaneously. HEENT: Normal  Neck: Supple without bruits or JVD. Lungs:  Resp regular and unlabored, CTA. Heart: RRR no s3, s4, or murmurs. Abdomen: Soft, non-tender, non-distended, BS + x 4.  Extremities: No clubbing, cyanosis or edema. DP/PT/Radials 2+ and equal bilaterally.  Labs    Troponin [facility] Care Test)  Recent Labs  12/08/15 1305  TROPIPOC 0.00   No results for input(s): CKTOTAL, CKMB, TROPONINI in the last 72 hours. Lab Results  Component Value Date   WBC 2.1* 12/08/2015   HGB 10.7* 12/08/2015   HCT 34.2* 12/08/2015   MCV 73.9* 12/08/2015   PLT 133* 12/08/2015     Recent Labs Lab 12/08/15 1255  NA 138  K 4.2  CL 105  CO2 26  BUN 10  CREATININE 1.02*  CALCIUM 9.1  PROT 5.9*  BILITOT 0.5  ALKPHOS 55  ALT 14  AST 22  GLUCOSE  77   Lab Results  Component Value Date   CHOL 193 05/13/2014   HDL 74.60 05/13/2014   LDLCALC 107* 05/13/2014   TRIG 58.0 05/13/2014   Lab Results  Component Value Date   DDIMER 0.58* 11/05/2012     Radiology Studies    Dg Chest 2 View  12/08/2015  CLINICAL DATA:  Chest pain. EXAM: CHEST  2 VIEW COMPARISON:  October 02, 2013. FINDINGS: Stable cardiomediastinal silhouette. No pneumothorax or pleural effusion is noted. Multilevel degenerative disc disease is noted in the thoracic spine. Left lung is clear. Mild right basilar subsegmental atelectasis is noted. IMPRESSION: Mild right basilar subsegmental atelectasis. Electronically Signed   By: Marijo Conception, M.D.   On: 12/08/2015 14:23    ECG & Cardiac Imaging    EKG: 12/08/2015 SR Rate-55 No acute ST/T wave abnormalities   ECHO: 11/25/2015  Study Conclusions  - Left ventricle: The cavity size was normal. There was mild focal  basal hypertrophy of the septum. Systolic function was normal.  The estimated ejection fraction was in the range of 55% to 60%.  Wall motion was normal; there were no regional wall motion  abnormalities. Doppler parameters are consistent with abnormal  left ventricular relaxation (grade 1 diastolic dysfunction). - Mitral valve: Calcified annulus. There was mild regurgitation. - Left atrium: The atrium was mildly dilated.  Impressions:  - Normal LV function; grade 1 diastolic dysfunction; mild LAE; mild  MR and TR.  Assessment & Plan    1. Chest pain: She reports intermittent episodes of chest pain with radiation into her jaw that are chronic for her, normally relieved with antacids. This morning she was sitting at the table around 11am reading the newspaper and she developed a one of these episodes, when she took an antacid. Pain did not subside, so she took  3 SL Nitro without relief. She feels these may have been expired? Called 911 and was transported to  the ED. Reports pain was a 10/10 and  given 2mg  of morphine which brought the pain to 7/10. EKG showed SR Rate-55,, POC trop negative. Noted to be slightly hypertensive on arrival.  --LHC in 2007 showed normal coronary arteries --2D Echo this month showed a normal EF with no WMA. --Given her reports of these episodes of pain in the past, and considering the pain developed after eating, I think this is more related to her GERD.  --Will cycle trops --Place in Obs overnight and reassess tomorrow --Continue home medications.   2. GERD: On Zantac at home --Will order protonix   3. HTN: Continue home medications, will adjust if needed.   4. Hypothyroidism: Continue home dose of synthroid.   5. Anemia: Baseline seems to be around 11, slightly lower today at 10.7. Will monitor.   Barnet Pall, NP-C Pager [phone] 12/08/2015, 4:17 PM   Agree with note by [name] NP-C  Prob non cardiac CP . Atyp. H/O GERD. Nl cath in the past. ENZ neg. EKG w/o acute changes. Exam benign. Will observe over night, cycle enz.   Lorretta Harp, M.D., [place], [facility], [place] [address]. [place], [place]  [address]  [phone] 12/08/2015 4:25 PM

## 2015-12-08 NOTE — ED Notes (Signed)
PA Tapia at bedside

## 2015-12-08 NOTE — ED Notes (Signed)
Heart healthy dinner tray ordered @ 1719.

## 2015-12-08 NOTE — ED Notes (Signed)
Pt arrives via [facility] for c/o sharp, left sided chest pain that radiates to her left jaw that began after she ate breakfast. Pt denies n/v/sob. Ems reports pt took 324mg  asa and 3 sl nitro pta. resp e/u, nad.

## 2015-12-08 NOTE — ED Notes (Signed)
Dinner tray at bedside

## 2015-12-08 NOTE — Plan of Care (Signed)
Problem: Safety: Goal: Ability to remain free from injury will improve Outcome: Progressing Reviewed how to use the call light and her phone to reach the RN/NT and showed her the numbers on the shite board, reviewed safety rules and explained why camera is in the room and that I would be placing the bed alarm on her bed and why, patient verbalized understanding, will continue to monitor.

## 2015-12-09 ENCOUNTER — Other Ambulatory Visit: Payer: Self-pay

## 2015-12-09 DIAGNOSIS — I209 Angina pectoris, unspecified: Secondary | ICD-10-CM | POA: Diagnosis not present

## 2015-12-09 DIAGNOSIS — E785 Hyperlipidemia, unspecified: Secondary | ICD-10-CM | POA: Diagnosis not present

## 2015-12-09 DIAGNOSIS — I2510 Atherosclerotic heart disease of native coronary artery without angina pectoris: Secondary | ICD-10-CM | POA: Diagnosis not present

## 2015-12-09 DIAGNOSIS — I10 Essential (primary) hypertension: Secondary | ICD-10-CM | POA: Diagnosis not present

## 2015-12-09 DIAGNOSIS — E039 Hypothyroidism, unspecified: Secondary | ICD-10-CM | POA: Diagnosis not present

## 2015-12-09 LAB — BASIC METABOLIC PANEL
Anion gap: 7 (ref 5–15)
BUN: 9 mg/dL (ref 6–20)
CO2: 28 mmol/L (ref 22–32)
Calcium: 9.1 mg/dL (ref 8.9–10.3)
Chloride: 104 mmol/L (ref 101–111)
Creatinine, Ser: 0.72 mg/dL (ref 0.44–1.00)
GFR calc Af Amer: >60 mL/min (ref >60)
GFR calc non Af Amer: >60 mL/min (ref >60)
Glucose, Bld: 83 mg/dL (ref 65–99)
Potassium: 4.2 mmol/L (ref 3.5–5.1)
Sodium: 139 mmol/L (ref 135–145)

## 2015-12-09 LAB — TROPONIN I
Troponin I: <0.03 ng/mL (ref <0.031)
Troponin I: <0.03 ng/mL (ref <0.031)

## 2015-12-09 MED ORDER — SODIUM CHLORIDE 0.9 % IV SOLN: 250.0000 mL | INTRAVENOUS | Status: DC | PRN

## 2015-12-09 MED ORDER — SODIUM CHLORIDE 0.9 % WEIGHT BASED INFUSION: 1.0000 mL/kg/h | INTRAVENOUS | Status: DC

## 2015-12-09 MED ORDER — SODIUM CHLORIDE 0.9 % WEIGHT BASED INFUSION
3.0000 mL/kg/h | INTRAVENOUS | Status: DC
  Administered 2015-12-10: 3 mL/kg/h via INTRAVENOUS

## 2015-12-09 MED ORDER — TRAZODONE HCL 100 MG PO TABS
200.0000 mg | ORAL_TABLET | Freq: Every day | ORAL | Status: DC
  Administered 2015-12-09 – 2015-12-10 (×2): 200 mg via ORAL
  Filled 2015-12-09 (×2): qty 2

## 2015-12-09 MED ORDER — SODIUM CHLORIDE 0.9% FLUSH: 3.0000 mL | Freq: Two times a day (BID) | INTRAVENOUS | Status: DC

## 2015-12-09 MED ORDER — SODIUM CHLORIDE 0.9% FLUSH: 3.0000 mL | INTRAVENOUS | Status: DC | PRN

## 2015-12-09 MED ORDER — ISOSORBIDE MONONITRATE ER 30 MG PO TB24
15.0000 mg | ORAL_TABLET | Freq: Every day | ORAL | Status: DC
  Administered 2015-12-09 – 2015-12-10 (×2): 15 mg via ORAL
  Filled 2015-12-09 (×2): qty 1

## 2015-12-09 NOTE — Care Management Note (Signed)
Case Management Note  Patient Details  Name: [name] MRN: [id number] Date of Birth: 06-25-1932  Subjective/Objective:   Pt in for chest pain. Pt is from home @ [employer]. 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 Pt has [employer] for transportation needs to MD appointments.                  Action/Plan: No needs identified by CM at this time. Will continue to monitor.    Expected Discharge Date:                  Expected Discharge Plan:  Home/Self Care ([place]. ([place] [facility]))  In-House Referral:  NA  Discharge planning Services  CM Consult  Post Acute Care Choice:    Choice offered to:     DME Arranged:    DME Agency:     HH Arranged:    HH Agency:     Status of Service:  In process, will continue to follow  Medicare Important Message Given:    Date Medicare IM Given:    Medicare IM give by:    Date Additional Medicare IM Given:    Additional Medicare Important Message give by:     If discussed at [place] of Stay Meetings, dates discussed:    Additional Comments:  Bethena Roys, RN 12/09/2015, 11:29 AM

## 2015-12-09 NOTE — Progress Notes (Signed)
Report received in patient's room via [facility] RN using [employer], reviewed VS, Meds, labs and patient's general condition, assumed care of patient.

## 2015-12-09 NOTE — Progress Notes (Signed)
Patient Name: [name] Date of Encounter: 12/09/2015  Hospital Problem List     Active Problems:   Chest pain    Subjective   States she did have one episode of brief chest pain early this morning.   Inpatient Medications    . acidophilus  1 capsule Oral Daily  . amLODipine  5 mg Oral Daily  . aspirin  324 mg Oral NOW   Or  . aspirin  300 mg Rectal NOW  . aspirin EC  81 mg Oral Daily  . azelastine  1 spray Each Nare Daily  . cholecalciferol  3,000 Units Oral Daily  . cloNIDine  0.1 mg Oral Daily  . famotidine  20 mg Oral Daily  . heparin  5,000 Units Subcutaneous Q8H  . irbesartan  300 mg Oral Daily  . levothyroxine  137 mcg Oral QAC breakfast  . linaclotide  145 mcg Oral Daily  . multivitamin with minerals  1 tablet Oral Daily  . OLANZapine  5 mg Oral QHS  . polyethylene glycol  17 g Oral BID  . potassium chloride SA  20 mEq Oral Daily  . traZODone  150 mg Oral QHS    Vital Signs    Filed Vitals:   12/09/15 0435 12/09/15 0440 12/09/15 0450 12/09/15 0520  BP: 138/70 127/68 124/63   Pulse:      Temp: 98.2 F (36.8 C)   98.7 F (37.1 C)  TempSrc: Oral     Resp: 16 16    Height:      Weight:    149 lb 12.8 oz (67.949 kg)  SpO2: 96%   97%    Intake/Output Summary (Last 24 hours) at 12/09/15 0844 Last data filed at 12/09/15 0745  Gross per 24 hour  Intake    240 ml  Output   2700 ml  Net  -2460 ml   Filed Weights   12/08/15 1233 12/08/15 1815 12/09/15 0520  Weight: 153 lb (69.4 kg) 152 lb (68.947 kg) 149 lb 12.8 oz (67.949 kg)    Physical Exam    General: Pleasant older female, NAD. Neuro: Alert and oriented X 3. Moves all extremities spontaneously. Psych: Normal affect. HEENT:  Normal  Neck: Supple without bruits or JVD. Lungs:  Resp regular and unlabored, CTA. Heart: RRR no s3, s4, or murmurs. Abdomen: Soft, non-tender, non-distended, BS + x 4.  Extremities: No clubbing, cyanosis or edema. DP/PT/Radials 2+ and equal  bilaterally.  Labs    CBC  Recent Labs  12/08/15 1255  WBC 2.1*  HGB 10.7*  HCT 34.2*  MCV 73.9*  PLT Q000111Q*   Basic Metabolic Panel  Recent Labs  12/08/15 1255 12/09/15 0649  NA 138 139  K 4.2 4.2  CL 105 104  CO2 26 28  GLUCOSE 77 83  BUN 10 9  CREATININE 1.02* 0.72  CALCIUM 9.1 9.1   Liver Function Tests  Recent Labs  12/08/15 1255  AST 22  ALT 14  ALKPHOS 55  BILITOT 0.5  PROT 5.9*  ALBUMIN 3.3*   Cardiac Enzymes  Recent Labs  12/08/15 1925 12/09/15 0026 12/09/15 0649  TROPONINI <0.03 <0.03 <0.03     Telemetry    SR Rate-50s-60s  ECG    SR Rate-65 No Acute ST/T wave abnormalities  Radiology    Dg Chest 2 View  12/08/2015  CLINICAL DATA:  Chest pain. EXAM: CHEST  2 VIEW COMPARISON:  October 02, 2013. FINDINGS: Stable cardiomediastinal silhouette. No pneumothorax or pleural effusion  is noted. Multilevel degenerative disc disease is noted in the thoracic spine. Left lung is clear. Mild right basilar subsegmental atelectasis is noted. IMPRESSION: Mild right basilar subsegmental atelectasis. Electronically Signed   By: Marijo Conception, M.D.   On: 12/08/2015 14:23    Assessment & Plan    1. Chest pain: She reports intermittent episodes of chest pain with radiation into her jaw that are chronic for her, normally relieved with antacids. This morning she was sitting at the table around 11am reading the newspaper and she developed a one of these episodes, when she took an antacid. Pain did not subside, so she took 3 SL Nitro without relief. She feels these may have been expired? Called 911 and was transported to the ED. Reports pain was a 10/10 and given 2mg  of morphine which brought the pain to 7/10. EKG showed SR Rate-55,, POC trop negative. Noted to be slightly hypertensive on arrival.  --LHC in 2007 showed normal coronary arteries --2D Echo this month showed a normal EF with no WMA. --Was admitted overnight for Obs and troponins neg x3.  --Still  reporting some chest discomfort that happened earlier this morning, relieved with nitro. Given she is still having these episodes and has a hx of small vessel disease, could consider adding long-acting Nitrate? Since she is still having chest pain, she could benefit from either stress testing or cath? Will discuss with Dr. Gwenlyn Found.  2. GERD: Continue home medications.  3. HTN: Continue home medications, good controlled noted.  4. Hypothyroidism: Continue home dose of synthroid.   5. Anemia: Baseline seems to be around 11, slightly lower yesterday at 10.7. Follow CBC  Signed, [name] NP-C Pager [phone]  Agree with note by [name] NP-C  Pt had recurrent CP this AM. Enz neg. Plan definitive cor angio later today.  I have reviewed the risks, indications, and alternatives to cardiac catheterization, possible angioplasty, and stenting with the patient. Risks include but are not limited to bleeding, infection, vascular injury, stroke, myocardial infection, arrhythmia, kidney injury, radiation-related injury in the case of prolonged fluoroscopy use, emergency cardiac surgery, and death. The patient understands the risks of serious complication is 1-2 in [id number] with diagnostic cardiac cath and 1-2% or less with angioplasty/stenting.   Lorretta Harp, M.D., [place], [facility], [place] [address] [place], [place]  [address]  [phone] 12/09/2015 10:38 AM

## 2015-12-09 NOTE — Care Management Obs Status (Signed)
[facility] NOTIFICATION   Patient Details  Name: [name] MRN: [id number] Date of Birth: [birth year]   Medicare Observation Status Notification Given:  Yes    Bethena Roys, RN 12/09/2015, 11:27 AM

## 2015-12-09 NOTE — Plan of Care (Signed)
Problem: Pain Managment: Goal: General experience of comfort will improve Outcome: Progressing Patient was c/o tightness in her ribcage on her left and right side, was able to tell the quality, radiation, pain number on (0-10) scale and duration, Tylenol 650 mg given, will re-evaluate and call MD if no relief and will continue to monitor.

## 2015-12-10 ENCOUNTER — Encounter (HOSPITAL_COMMUNITY)
Admission: EM | Disposition: A | Discharge status: Home / Self Care | Payer: Self-pay | Source: Home / Self Care | Attending: Emergency Medicine

## 2015-12-10 DIAGNOSIS — I10 Essential (primary) hypertension: Secondary | ICD-10-CM

## 2015-12-10 DIAGNOSIS — I2510 Atherosclerotic heart disease of native coronary artery without angina pectoris: Secondary | ICD-10-CM | POA: Diagnosis not present

## 2015-12-10 DIAGNOSIS — R072 Precordial pain: Secondary | ICD-10-CM | POA: Insufficient documentation

## 2015-12-10 DIAGNOSIS — E785 Hyperlipidemia, unspecified: Secondary | ICD-10-CM | POA: Diagnosis not present

## 2015-12-10 DIAGNOSIS — E039 Hypothyroidism, unspecified: Secondary | ICD-10-CM | POA: Diagnosis not present

## 2015-12-10 HISTORY — PX: CARDIAC CATHETERIZATION: SHX172

## 2015-12-10 LAB — PROTIME-INR
INR: 1.14 (ref 0.00–1.49)
Prothrombin Time: 14.8 seconds (ref 11.6–15.2)

## 2015-12-10 LAB — CBC
HCT: 34 % — ABNORMAL LOW (ref 36.0–46.0)
Hemoglobin: 10.6 g/dL — ABNORMAL LOW (ref 12.0–15.0)
MCH: 23 pg — ABNORMAL LOW (ref 26.0–34.0)
MCHC: 31.2 g/dL (ref 30.0–36.0)
MCV: 73.8 fL — ABNORMAL LOW (ref 78.0–100.0)
Platelets: 132 10*3/uL — ABNORMAL LOW (ref 150–400)
RBC: 4.61 MIL/uL (ref 3.87–5.11)
RDW: 15.4 % (ref 11.5–15.5)
WBC: 2.4 10*3/uL — ABNORMAL LOW (ref 4.0–10.5)

## 2015-12-10 SURGERY — LEFT HEART CATH AND CORONARY ANGIOGRAPHY

## 2015-12-10 MED ORDER — LIDOCAINE HCL (PF) 1 % IJ SOLN
INTRAMUSCULAR | Status: AC
  Filled 2015-12-10: qty 30

## 2015-12-10 MED ORDER — MIDAZOLAM HCL 2 MG/2ML IJ SOLN
INTRAMUSCULAR | Status: DC | PRN
  Administered 2015-12-10 (×2): 1 mg via INTRAVENOUS

## 2015-12-10 MED ORDER — LIDOCAINE HCL (PF) 1 % IJ SOLN
INTRAMUSCULAR | Status: DC | PRN
  Administered 2015-12-10: 2 mL
  Administered 2015-12-10: 15 mL

## 2015-12-10 MED ORDER — SODIUM CHLORIDE 0.9 % IV SOLN: INTRAVENOUS | Status: AC

## 2015-12-10 MED ORDER — FENTANYL CITRATE (PF) 100 MCG/2ML IJ SOLN
INTRAMUSCULAR | Status: AC
  Filled 2015-12-10: qty 2

## 2015-12-10 MED ORDER — FENTANYL CITRATE (PF) 100 MCG/2ML IJ SOLN
INTRAMUSCULAR | Status: DC | PRN
  Administered 2015-12-10 (×2): 25 ug via INTRAVENOUS

## 2015-12-10 MED ORDER — SODIUM CHLORIDE 0.9% FLUSH: 3.0000 mL | INTRAVENOUS | Status: DC | PRN

## 2015-12-10 MED ORDER — HEPARIN (PORCINE) IN NACL 2-0.9 UNIT/ML-% IJ SOLN
INTRAMUSCULAR | Status: AC
  Filled 2015-12-10: qty 500

## 2015-12-10 MED ORDER — HEPARIN (PORCINE) IN NACL 2-0.9 UNIT/ML-% IJ SOLN
INTRAMUSCULAR | Status: AC
  Filled 2015-12-10: qty 1000

## 2015-12-10 MED ORDER — MIDAZOLAM HCL 2 MG/2ML IJ SOLN
INTRAMUSCULAR | Status: AC
  Filled 2015-12-10: qty 2

## 2015-12-10 MED ORDER — SODIUM CHLORIDE 0.9% FLUSH
3.0000 mL | Freq: Two times a day (BID) | INTRAVENOUS | Status: DC
  Administered 2015-12-11: 3 mL via INTRAVENOUS

## 2015-12-10 MED ORDER — IOPAMIDOL (ISOVUE-370) INJECTION 76%
INTRAVENOUS | Status: DC | PRN
  Administered 2015-12-10: 50 mL via INTRA_ARTERIAL

## 2015-12-10 MED ORDER — IOPAMIDOL (ISOVUE-370) INJECTION 76%
INTRAVENOUS | Status: AC
  Filled 2015-12-10: qty 100

## 2015-12-10 MED ORDER — SODIUM CHLORIDE 0.9 % IV SOLN
250.0000 mL | INTRAVENOUS | Status: DC | PRN
Start: 2015-12-10 — End: 2015-12-11

## 2015-12-10 MED ORDER — HEPARIN (PORCINE) IN NACL 2-0.9 UNIT/ML-% IJ SOLN
INTRAMUSCULAR | Status: DC | PRN
  Administered 2015-12-10: 14:00:00

## 2015-12-10 MED ORDER — VERAPAMIL HCL 2.5 MG/ML IV SOLN
INTRAVENOUS | Status: AC
  Filled 2015-12-10: qty 2

## 2015-12-10 SURGICAL SUPPLY — 11 items
CATH INFINITI 5FR AL1 (CATHETERS) ×1 IMPLANT
CATH INFINITI 5FR MULTPACK ANG (CATHETERS) ×1 IMPLANT
GLIDESHEATH SLEND SS 6F .021 (SHEATH) ×1 IMPLANT
KIT HEART LEFT (KITS) ×2 IMPLANT
PACK CARDIAC CATHETERIZATION (CUSTOM PROCEDURE TRAY) ×2 IMPLANT
SHEATH PINNACLE 5F 10CM (SHEATH) ×1 IMPLANT
SYR MEDRAD MARK V 150ML (SYRINGE) ×2 IMPLANT
TRANSDUCER W/STOPCOCK (MISCELLANEOUS) ×2 IMPLANT
TUBING CIL FLEX 10 FLL-RA (TUBING) ×2 IMPLANT
WIRE EMERALD 3MM-J .035X150CM (WIRE) ×1 IMPLANT
WIRE SAFE-T 1.5MM-J .035X260CM (WIRE) ×1 IMPLANT

## 2015-12-10 NOTE — Interval H&P Note (Signed)
History and Physical Interval Note:  12/10/2015 1:35 PM  [name]  has presented today for cardiac cath with the diagnosis of unstable angina.  The various methods of treatment have been discussed with the patient and family. After consideration of risks, benefits and other options for treatment, the patient has consented to  Procedure(s): Left Heart Cath and Coronary Angiography (N/A) as a surgical intervention .  The patient's history has been reviewed, patient examined, no change in status, stable for surgery.  I have reviewed the patient's chart and labs.  Questions were answered to the patient's satisfaction.     Lauree Chandler

## 2015-12-10 NOTE — H&P (View-Only) (Signed)
Patient Name: [name] Date of Encounter: 12/10/2015  Hospital Problem List     Active Problems:   Chest pain   Essential hypertension, benign   Hypothyroidism   CAD (coronary artery disease)   Anemia of chronic illness    Subjective   No chest pain this morning. Did have a headache but relieved with tylenol  Inpatient Medications    . acidophilus  1 capsule Oral Daily  . amLODipine  5 mg Oral Daily  . aspirin EC  81 mg Oral Daily  . azelastine  1 spray Each Nare Daily  . cholecalciferol  3,000 Units Oral Daily  . cloNIDine  0.1 mg Oral Daily  . famotidine  20 mg Oral Daily  . heparin  5,000 Units Subcutaneous Q8H  . irbesartan  300 mg Oral Daily  . isosorbide mononitrate  15 mg Oral Daily  . levothyroxine  137 mcg Oral QAC breakfast  . linaclotide  145 mcg Oral Daily  . multivitamin with minerals  1 tablet Oral Daily  . OLANZapine  5 mg Oral QHS  . polyethylene glycol  17 g Oral BID  . potassium chloride SA  20 mEq Oral Daily  . sodium chloride flush  3 mL Intravenous Q12H  . traZODone  200 mg Oral QHS    Vital Signs    Filed Vitals:   12/09/15 1406 12/09/15 2200 12/10/15 0553 12/10/15 0604  BP: 140/67 126/78  138/70  Pulse: 62 66  67  Temp: 98.8 F (37.1 C) 98.2 F (36.8 C)  98 F (36.7 C)  TempSrc: Oral Oral  Oral  Resp: 18 18  18   Height:      Weight:   149 lb 11.2 oz (67.903 kg)   SpO2: 97% 98%  99%    Intake/Output Summary (Last 24 hours) at 12/10/15 0930 Last data filed at 12/09/15 1109  Gross per 24 hour  Intake      0 ml  Output    300 ml  Net   -300 ml   Filed Weights   12/08/15 1815 12/09/15 0520 12/10/15 0553  Weight: 152 lb (68.947 kg) 149 lb 12.8 oz (67.949 kg) 149 lb 11.2 oz (67.903 kg)    Physical Exam    General: Pleasant older female, NAD. Neuro: Alert and oriented X 3. Moves all extremities spontaneously. Psych: Normal affect. HEENT: Normal Neck: Supple without bruits or JVD. Lungs: Resp regular and  unlabored, CTA. Heart: RRR no s3, s4, or murmurs. Abdomen: Soft, non-tender, non-distended, BS + x 4.  Extremities: No clubbing, cyanosis or edema. DP/PT/Radials 2+ and equal bilaterally.  Labs    CBC  Recent Labs  12/08/15 1255 12/10/15 0400  WBC 2.1* 2.4*  HGB 10.7* 10.6*  HCT 34.2* 34.0*  MCV 73.9* 73.8*  PLT 133* Q000111Q*   Basic Metabolic Panel  Recent Labs  12/08/15 1255 12/09/15 0649  NA 138 139  K 4.2 4.2  CL 105 104  CO2 26 28  GLUCOSE 77 83  BUN 10 9  CREATININE 1.02* 0.72  CALCIUM 9.1 9.1   Liver Function Tests  Recent Labs  12/08/15 1255  AST 22  ALT 14  ALKPHOS 55  BILITOT 0.5  PROT 5.9*  ALBUMIN 3.3*   Cardiac Enzymes  Recent Labs  12/08/15 1925 12/09/15 0026 12/09/15 0649  TROPONINI <0.03 <0.03 <0.03    Telemetry    SR Rate-60 Occasional PVCs   ECG    SR Rate-69 No Acute ST/T wave abnormalities  Radiology  Dg Chest 2 View  12/08/2015  CLINICAL DATA:  Chest pain. EXAM: CHEST  2 VIEW COMPARISON:  October 02, 2013. FINDINGS: Stable cardiomediastinal silhouette. No pneumothorax or pleural effusion is noted. Multilevel degenerative disc disease is noted in the thoracic spine. Left lung is clear. Mild right basilar subsegmental atelectasis is noted. IMPRESSION: Mild right basilar subsegmental atelectasis. Electronically Signed   By: Marijo Conception, M.D.   On: 12/08/2015 14:23    Assessment & Plan    1. Chest pain: Admitted for chest pain with plans intially for overnight observation. Trop cycled and neg x3. She continued to have chest pain during admission. Imdur was added yesterday, and plans for LHC this afternoon.  --Denies any chest pain this morning with the addition of Imdur, but did develop a headache that was relieved by tylenol --Informed by the nursing staff that she was hesitant to sign her consent form for LHC today. I talked with patient again to explain the risks and procedure along with clarified any questions she had. Is  still agreeable to move forward with LHC this afternoon.    2. GERD: Continue home medications.  3. HTN: Continue home medications, good controlled noted.  4. Hypothyroidism: Continue home dose of synthroid.   5. Anemia: Stable Hgb, near baseline.   Signed, [name] NP-C Pager [phone]   Agree with note by [name] NP-C  No further CP. For cor angio later today  Lorretta Harp, M.D., FACP, [facility], [place], [place] [address]. [place], [place]  [address]  [phone] 12/10/2015 10:27 AM

## 2015-12-10 NOTE — Progress Notes (Signed)
Back to room from Cath Lab via Stretcher. Rt groin site benign s/s bleed or hematoma with guaze and tegaderm C, D, and I. Rt Radial site benign with Guaze and Tegaderm intact. Rt RP 2+, Rt DP 3+. VSS. IVF at 77ml/hr per PIV. Post Cath instructions discussed with pt and son. ie Bedrest until 1925 with HOB < 30 and RLE straight and flat. Call for needs, bleed, hematoma, numbness, tingling or other concerns. Verbalized understanding. No questions or concerns expressed.

## 2015-12-10 NOTE — Progress Notes (Signed)
Patient Name: [name] Date of Encounter: 12/10/2015  Hospital Problem List     Active Problems:   Chest pain   Essential hypertension, benign   Hypothyroidism   CAD (coronary artery disease)   Anemia of chronic illness    Subjective   No chest pain this morning. Did have a headache but relieved with tylenol  Inpatient Medications    . acidophilus  1 capsule Oral Daily  . amLODipine  5 mg Oral Daily  . aspirin EC  81 mg Oral Daily  . azelastine  1 spray Each Nare Daily  . cholecalciferol  3,000 Units Oral Daily  . cloNIDine  0.1 mg Oral Daily  . famotidine  20 mg Oral Daily  . heparin  5,000 Units Subcutaneous Q8H  . irbesartan  300 mg Oral Daily  . isosorbide mononitrate  15 mg Oral Daily  . levothyroxine  137 mcg Oral QAC breakfast  . linaclotide  145 mcg Oral Daily  . multivitamin with minerals  1 tablet Oral Daily  . OLANZapine  5 mg Oral QHS  . polyethylene glycol  17 g Oral BID  . potassium chloride SA  20 mEq Oral Daily  . sodium chloride flush  3 mL Intravenous Q12H  . traZODone  200 mg Oral QHS    Vital Signs    Filed Vitals:   12/09/15 1406 12/09/15 2200 12/10/15 0553 12/10/15 0604  BP: 140/67 126/78  138/70  Pulse: 62 66  67  Temp: 98.8 F (37.1 C) 98.2 F (36.8 C)  98 F (36.7 C)  TempSrc: Oral Oral  Oral  Resp: 18 18  18   Height:      Weight:   149 lb 11.2 oz (67.903 kg)   SpO2: 97% 98%  99%    Intake/Output Summary (Last 24 hours) at 12/10/15 0930 Last data filed at 12/09/15 1109  Gross per 24 hour  Intake      0 ml  Output    300 ml  Net   -300 ml   Filed Weights   12/08/15 1815 12/09/15 0520 12/10/15 0553  Weight: 152 lb (68.947 kg) 149 lb 12.8 oz (67.949 kg) 149 lb 11.2 oz (67.903 kg)    Physical Exam    General: Pleasant older female, NAD. Neuro: Alert and oriented X 3. Moves all extremities spontaneously. Psych: Normal affect. HEENT: Normal Neck: Supple without bruits or JVD. Lungs: Resp regular and  unlabored, CTA. Heart: RRR no s3, s4, or murmurs. Abdomen: Soft, non-tender, non-distended, BS + x 4.  Extremities: No clubbing, cyanosis or edema. DP/PT/Radials 2+ and equal bilaterally.  Labs    CBC  Recent Labs  12/08/15 1255 12/10/15 0400  WBC 2.1* 2.4*  HGB 10.7* 10.6*  HCT 34.2* 34.0*  MCV 73.9* 73.8*  PLT 133* Q000111Q*   Basic Metabolic Panel  Recent Labs  12/08/15 1255 12/09/15 0649  NA 138 139  K 4.2 4.2  CL 105 104  CO2 26 28  GLUCOSE 77 83  BUN 10 9  CREATININE 1.02* 0.72  CALCIUM 9.1 9.1   Liver Function Tests  Recent Labs  12/08/15 1255  AST 22  ALT 14  ALKPHOS 55  BILITOT 0.5  PROT 5.9*  ALBUMIN 3.3*   Cardiac Enzymes  Recent Labs  12/08/15 1925 12/09/15 0026 12/09/15 0649  TROPONINI <0.03 <0.03 <0.03    Telemetry    SR Rate-60 Occasional PVCs   ECG    SR Rate-69 No Acute ST/T wave abnormalities  Radiology  Dg Chest 2 View  12/08/2015  CLINICAL DATA:  Chest pain. EXAM: CHEST  2 VIEW COMPARISON:  October 02, 2013. FINDINGS: Stable cardiomediastinal silhouette. No pneumothorax or pleural effusion is noted. Multilevel degenerative disc disease is noted in the thoracic spine. Left lung is clear. Mild right basilar subsegmental atelectasis is noted. IMPRESSION: Mild right basilar subsegmental atelectasis. Electronically Signed   By: Marijo Conception, M.D.   On: 12/08/2015 14:23    Assessment & Plan    1. Chest pain: Admitted for chest pain with plans intially for overnight observation. Trop cycled and neg x3. She continued to have chest pain during admission. Imdur was added yesterday, and plans for LHC this afternoon.  --Denies any chest pain this morning with the addition of Imdur, but did develop a headache that was relieved by tylenol --Informed by the nursing staff that she was hesitant to sign her consent form for LHC today. I talked with patient again to explain the risks and procedure along with clarified any questions she had. Is  still agreeable to move forward with LHC this afternoon.    2. GERD: Continue home medications.  3. HTN: Continue home medications, good controlled noted.  4. Hypothyroidism: Continue home dose of synthroid.   5. Anemia: Stable Hgb, near baseline.   Signed, [name] Bellis NP-C Pager [phone]   Agree with note by [name] Bellis NP-C  No further CP. For cor angio later today  Lorretta Harp, M.D., FACP, [facility], [place], [place] [address] [place], [place]  [address]  [phone] 12/10/2015 10:27 AM

## 2015-12-10 NOTE — CV Procedure (Signed)
5 Fr. R Femoral sheath was pulled at 1500, and manual pressure applied for 25 min.  VS were stable, HR=71, BP 138/78 and SPO2 97 %, Bed rest starts from 15 25 to 19 25.

## 2015-12-11 ENCOUNTER — Encounter (HOSPITAL_COMMUNITY): Payer: Self-pay | Admitting: Cardiovascular Disease

## 2015-12-11 DIAGNOSIS — R079 Chest pain, unspecified: Secondary | ICD-10-CM | POA: Diagnosis not present

## 2015-12-11 DIAGNOSIS — I2510 Atherosclerotic heart disease of native coronary artery without angina pectoris: Secondary | ICD-10-CM | POA: Diagnosis not present

## 2015-12-11 LAB — CBC
HCT: 32.5 % — ABNORMAL LOW (ref 36.0–46.0)
Hemoglobin: 10 g/dL — ABNORMAL LOW (ref 12.0–15.0)
MCH: 22.6 pg — ABNORMAL LOW (ref 26.0–34.0)
MCHC: 30.8 g/dL (ref 30.0–36.0)
MCV: 73.5 fL — ABNORMAL LOW (ref 78.0–100.0)
Platelets: 131 10*3/uL — ABNORMAL LOW (ref 150–400)
RBC: 4.42 MIL/uL (ref 3.87–5.11)
RDW: 15.2 % (ref 11.5–15.5)
WBC: 3.5 10*3/uL — ABNORMAL LOW (ref 4.0–10.5)

## 2015-12-11 LAB — BASIC METABOLIC PANEL
Anion gap: 5 (ref 5–15)
BUN: 6 mg/dL (ref 6–20)
CO2: 28 mmol/L (ref 22–32)
Calcium: 9.2 mg/dL (ref 8.9–10.3)
Chloride: 102 mmol/L (ref 101–111)
Creatinine, Ser: 0.73 mg/dL (ref 0.44–1.00)
GFR calc Af Amer: >60 mL/min (ref >60)
GFR calc non Af Amer: >60 mL/min (ref >60)
Glucose, Bld: 103 mg/dL — ABNORMAL HIGH (ref 65–99)
Potassium: 3.7 mmol/L (ref 3.5–5.1)
Sodium: 135 mmol/L (ref 135–145)

## 2015-12-11 MED ORDER — NITROGLYCERIN 0.4 MG SL SUBL: 0.4000 mg | SUBLINGUAL_TABLET | SUBLINGUAL | Status: DC | PRN

## 2015-12-11 MED ORDER — PANTOPRAZOLE SODIUM 20 MG PO TBEC
20.0000 mg | DELAYED_RELEASE_TABLET | Freq: Every day | ORAL | Status: DC
  Filled 2015-12-11: qty 1

## 2015-12-11 MED ORDER — TRAZODONE HCL 100 MG PO TABS: 200.0000 mg | ORAL_TABLET | Freq: Every day | ORAL | Status: DC

## 2015-12-11 MED ORDER — PANTOPRAZOLE SODIUM 20 MG PO TBEC: 20.0000 mg | DELAYED_RELEASE_TABLET | Freq: Every day | ORAL | Status: DC

## 2015-12-11 MED FILL — Verapamil HCl IV Soln 2.5 MG/ML: INTRAVENOUS | Qty: 2 | Status: AC

## 2015-12-11 NOTE — Progress Notes (Signed)
Patient Name: [name] Date of Encounter: 12/11/2015  Hospital Problem List     Active Problems:   Chest pain   Essential hypertension, benign   Hypothyroidism   CAD (coronary artery disease)   Anemia of chronic illness   Precordial pain    Subjective   No chest pain this morning. Feels well.  Inpatient Medications    . acidophilus  1 capsule Oral Daily  . amLODipine  5 mg Oral Daily  . aspirin EC  81 mg Oral Daily  . azelastine  1 spray Each Nare Daily  . cholecalciferol  3,000 Units Oral Daily  . cloNIDine  0.1 mg Oral Daily  . famotidine  20 mg Oral Daily  . heparin  5,000 Units Subcutaneous Q8H  . irbesartan  300 mg Oral Daily  . levothyroxine  137 mcg Oral QAC breakfast  . linaclotide  145 mcg Oral Daily  . multivitamin with minerals  1 tablet Oral Daily  . OLANZapine  5 mg Oral QHS  . polyethylene glycol  17 g Oral BID  . potassium chloride SA  20 mEq Oral Daily  . sodium chloride flush  3 mL Intravenous Q12H  . traZODone  200 mg Oral QHS    Vital Signs    Filed Vitals:   12/10/15 1632 12/10/15 2207 12/11/15 0600 12/11/15 0623  BP: 114/76 126/69  110/53  Pulse: 61 87    Temp:  97.6 F (36.4 C)  98.8 F (37.1 C)  TempSrc:  Oral  Oral  Resp: 12     Height:      Weight:   150 lb 9.6 oz (68.312 kg)   SpO2: 97% 95%  94%    Intake/Output Summary (Last 24 hours) at 12/11/15 1221 Last data filed at 12/11/15 0843  Gross per 24 hour  Intake 1312.99 ml  Output      0 ml  Net 1312.99 ml   Filed Weights   12/09/15 0520 12/10/15 0553 12/11/15 0600  Weight: 149 lb 12.8 oz (67.949 kg) 149 lb 11.2 oz (67.903 kg) 150 lb 9.6 oz (68.312 kg)    Physical Exam    General: Pleasant older female, NAD. Neuro: Alert and oriented X 3. Moves all extremities spontaneously. Psych: Normal affect. HEENT: Normal Neck: Supple without bruits or JVD. Lungs: Resp regular and unlabored, CTA. Heart: RRR no s3, s4, or murmurs. Abdomen: Soft, non-tender,  non-distended, BS + x 4.  Extremities: No clubbing, cyanosis or edema. DP/PT/Radials 2+ and equal bilaterally. Right radial and femoral site stable without hematoma or bruising.   Labs    CBC  Recent Labs  12/10/15 0400 12/11/15 0430  WBC 2.4* 3.5*  HGB 10.6* 10.0*  HCT 34.0* 32.5*  MCV 73.8* 73.5*  PLT 132* [id number]*   Basic Metabolic Panel  Recent Labs  12/09/15 0649 12/11/15 0430  NA 139 135  K 4.2 3.7  CL 104 102  CO2 28 28  GLUCOSE 83 103*  BUN 9 6  CREATININE 0.72 0.73  CALCIUM 9.1 9.2   Liver Function Tests  Recent Labs  12/08/15 1255  AST 22  ALT 14  ALKPHOS 55  BILITOT 0.5  PROT 5.9*  ALBUMIN 3.3*   Cardiac Enzymes  Recent Labs  12/08/15 1925 12/09/15 0026 12/09/15 0649  TROPONINI <0.03 <0.03 <0.03    Telemetry    SR Rate-60 Occasional PVCs   ECG    SR Rate-69 No Acute ST/T wave abnormalities  Radiology     Assessment &  Plan    1. Chest pain: Admitted for chest pain with plans intially for overnight observation. Trop cycled and neg x3. She continued to have chest pain during admission. LHC yesterday with mild nonobstructive CAD. Continue with medical management. Will dc Imdur because she reports having a headache with the medication and does not want to continue it. Pain likely related to hx of GERD, will change to Protonix.   2. GERD: Continue home medications.  3. HTN: Continue home medications, good controlled noted.  4. Hypothyroidism: Continue home dose of synthroid.   5. Anemia: Stable Hgb, near baseline.   --Plan for possible DC today.  Signed, [name] NP-C Pager [phone]  Agree with note by [name] NP-C  Clean cath yesterday. Non cardiac CP. Can DC home today.   Lorretta Harp, M.D., [place], [facility], [place] [address] [place], [place]  [address]  [phone] 12/11/2015 12:31 PM

## 2015-12-11 NOTE — Discharge Instructions (Signed)
Radial Site Care °Refer to this sheet in the next few weeks. These instructions provide you with information on caring for yourself after your procedure. Your caregiver may also give you more specific instructions. Your treatment has been planned according to current medical practices, but problems sometimes occur. Call your caregiver if you have any problems or questions after your procedure. °HOME CARE INSTRUCTIONS °· You may shower the day after the procedure. Remove the bandage (dressing) and gently wash the site with plain soap and water. Gently pat the site dry.  °· Do not apply powder or lotion to the site.  °· Do not submerge the affected site in water for 3 to 5 days.  °· Inspect the site at least twice daily.  °· Do not flex or bend the affected arm for 24 hours.  °· No lifting over 5 pounds (2.3 kg) for 5 days after your procedure.  °· Do not drive home if you are discharged the same day of the procedure. Have someone else drive you.  °· You may drive 24 hours after the procedure unless otherwise instructed by your caregiver.  °What to expect: °· Any bruising will usually fade within 1 to 2 weeks.  °· Blood that collects in the tissue (hematoma) may be painful to the touch. It should usually decrease in size and tenderness within 1 to 2 weeks.  °SEEK IMMEDIATE MEDICAL CARE IF: °· You have unusual pain at the radial site.  °· You have redness, warmth, swelling, or pain at the radial site.  °· You have drainage (other than a small amount of blood on the dressing).  °· You have chills.  °· You have a fever or persistent symptoms for more than 72 hours.  °· You have a fever and your symptoms suddenly get worse.  °· Your arm becomes pale, cool, tingly, or numb.  °· You have heavy bleeding from the site. Hold pressure on the site.  ° ° °Groin Site Care °Refer to this sheet in the next few weeks. These instructions provide you with information on caring for yourself after your procedure. Your caregiver may also  give you more specific instructions. Your treatment has been planned according to current medical practices, but problems sometimes occur. Call your caregiver if you have any problems or questions after your procedure. °HOME CARE INSTRUCTIONS °· You may shower 24 hours after the procedure. Remove the bandage (dressing) and gently wash the site with plain soap and water. Gently pat the site dry.  °· Do not apply powder or lotion to the site.  °· Do not sit in a bathtub, swimming pool, or whirlpool for 5 to 7 days.  °· No bending, squatting, or lifting anything over 10 pounds (4.5 kg) as directed by your caregiver.  °· Inspect the site at least twice daily.  °· Do not drive home if you are discharged the same day of the procedure. Have someone else drive you.  °· You may drive 24 hours after the procedure unless otherwise instructed by your caregiver.  °What to expect: °· Any bruising will usually fade within 1 to 2 weeks.  °· Blood that collects in the tissue (hematoma) may be painful to the touch. It should usually decrease in size and tenderness within 1 to 2 weeks.  °SEEK IMMEDIATE MEDICAL CARE IF: °· You have unusual pain at the groin site or down the affected leg.  °· You have redness, warmth, swelling, or pain at the groin site.  °· You have drainage (  other than a small amount of blood on the dressing).  °· You have chills.  °· You have a fever or persistent symptoms for more than 72 hours.  °· You have a fever and your symptoms suddenly get worse.  °· Your leg becomes pale, cool, tingly, or numb.  °You have heavy bleeding from the site. Hold pressure on the site. . ° °

## 2015-12-11 NOTE — Discharge Summary (Signed)
Discharge Summary    Patient ID: [name],  MRN: [id number], DOB/AGE: Dec 15, 1931 82 y.o.  Admit date: 12/08/2015 Discharge date: 12/11/2015  Primary Care Provider: Marton Redwood Primary Cardiologist: Dr. Cathie Olden  Discharge Diagnoses    Active Problems:   Chest pain   Essential hypertension, benign   Hypothyroidism   CAD (coronary artery disease)   Anemia of chronic illness   Precordial pain   Allergies Allergies  Allergen Reactions  . Statins Other (See Comments)    Muscle hurts  . Doxycycline Itching and Rash  . Prednisone Itching    REACTION: itching    Diagnostic Studies/Procedures    LHC: 12/10/2015  Procedures    Left Heart Cath and Coronary Angiography    Conclusion     Mid LAD to Dist LAD lesion, 20% stenosed.  1st Diag-1 lesion, 30% stenosed.  1st Diag-2 lesion, 50% stenosed.  1. Mild non-obstructive CAD  Recommendations: Continue medical management of CAD.     _____________   History of Present Illness     [name] is an 82 yo female patient of Dr. Cathie Olden with a PMH of minimal CAD (s/p LHC 2005 small inferoapical MI)/HLD/HTN/Hypothyroidism/s/p nissen fundoplication Acid Reflex who was last seen in the office 11/10/2015 and noted to be in Her usual state of health with her Norvasc decreased to 5mg  daily related to LEE. Her last LHC was back in 2007 where she was noted to have relatively normal coronary arteries and recommendations given to continue with medical therapy. 2D Echo on 11/25/2015 showed EF 55-60% G1DD with NWMA. She presented to the [facility] ED on 12/08/2015 with reports of intermittent chest pain that radiated up into her jaw.   Hospital Course     [name] was admitted from the ED to our service initially for overnight observation with plans to cycle her enzymes. Given her reports of chest pain, it was felt that the pain was more so related to her hx of GERD. On admission her labs showed Cr 1.02, Hgb 10.7, trop  cycled and neg x3.   On second day of admission she reported having continued chest pain during the night that was relieved somewhat by SL nitro. The decision was made to take her for [facility] given her hx of small vessel CAD. This was pushed until 12/10/2015 given the volume of patients on for cath. Imdur was added, and she did not report any further episodes of chest pain, but did report having a headache with the addition of this medicine.   On 12/10/2015 she underwent cath with Dr. Angelena Form which showed mild CAD with mid to distal LAD 20% stenosis, 1st diag 30%, and 50%, recommendations for continued medical management. She was keep over night and observed. Morning labs were stable, with right radial and femoral cath site stable without hematoma or bruising. She's had no recurrent chest pain on the morning of discharge. We did change her medication from Zantac to Protonix to see if this helps with recurrent episodes.   She was seen and assessed by Dr. Gwenlyn Found and determined stable for discharge. I have arranged for follow up.  _____________  Discharge Vitals Blood pressure 110/53, pulse 87, temperature 98.8 F (37.1 C), temperature source Oral, resp. rate 12, height 5' (1.524 m), weight 150 lb 9.6 oz (68.312 kg), SpO2 94 %.  Filed Weights   12/09/15 0520 12/10/15 0553 12/11/15 0600  Weight: 149 lb 12.8 oz (67.949 kg) 149 lb 11.2 oz (67.903 kg) 150 lb  9.6 oz (68.312 kg)    Labs & Radiologic Studies    CBC  Recent Labs  12/10/15 0400 12/11/15 0430  WBC 2.4* 3.5*  HGB 10.6* 10.0*  HCT 34.0* 32.5*  MCV 73.8* 73.5*  PLT 132* A999333*   Basic Metabolic Panel  Recent Labs  12/09/15 0649 12/11/15 0430  NA 139 135  K 4.2 3.7  CL 104 102  CO2 28 28  GLUCOSE 83 103*  BUN 9 6  CREATININE 0.72 0.73  CALCIUM 9.1 9.2   Cardiac Enzymes  Recent Labs  12/08/15 1925 12/09/15 0026 12/09/15 0649  TROPONINI <0.03 <0.03 <0.03   _____________  Dg Chest 2 View  12/08/2015  CLINICAL DATA:   Chest pain. EXAM: CHEST  2 VIEW COMPARISON:  October 02, 2013. FINDINGS: Stable cardiomediastinal silhouette. No pneumothorax or pleural effusion is noted. Multilevel degenerative disc disease is noted in the thoracic spine. Left lung is clear. Mild right basilar subsegmental atelectasis is noted. IMPRESSION: Mild right basilar subsegmental atelectasis. Electronically Signed   By: Marijo Conception, M.D.   On: 12/08/2015 14:23   Disposition   Pt is being discharged home today in good condition.  Follow-up Plans & Appointments    Follow-up Information    Follow up with Charlie Pitter, PA-C On 01/12/2016.   Specialties:  Cardiology, Radiology   Why:  11:30am for your hospital follow up.    Contact information:   [address] [place] [address] [phone]      Discharge Instructions    Diet - low sodium heart healthy    Complete by:  As directed      Increase activity slowly    Complete by:  As directed            Discharge Medications   Current Discharge Medication List    START taking these medications   Details  pantoprazole (PROTONIX) 20 MG tablet Take 1 tablet (20 mg total) by mouth daily. Qty: 30 tablet, Refills: 11      CONTINUE these medications which have CHANGED   Details  nitroGLYCERIN (NITROSTAT) 0.4 MG SL tablet Place 1 tablet (0.4 mg total) under the tongue every 5 (five) minutes x 3 doses as needed for chest pain. Qty: 25 tablet, Refills: 3    traZODone (DESYREL) 100 MG tablet Take 2 tablets (200 mg total) by mouth at bedtime. Qty: 60 tablet, Refills: 11      CONTINUE these medications which have NOT CHANGED   Details  amLODipine (NORVASC) 5 MG tablet Take 1 tablet (5 mg total) by mouth daily. Qty: 30 tablet, Refills: 11    aspirin EC 81 MG tablet Take 81 mg by mouth daily.    Azelastine HCl 0.15 % SOLN Place 1 spray into both nostrils daily.     Cholecalciferol 3000 units TABS Take 3,000 Units by mouth daily.    cloNIDine  (CATAPRES) 0.1 MG tablet Take 0.1 mg by mouth daily.     docusate sodium (COLACE) 100 MG capsule Take 100 mg by mouth daily as needed for moderate constipation.     ipratropium (ATROVENT) 0.06 % nasal spray Place 2 sprays into both nostrils as needed for rhinitis.     levothyroxine (SYNTHROID, LEVOTHROID) 137 MCG tablet Take 137 mcg by mouth daily before breakfast.    LINZESS 145 MCG CAPS capsule Take 145 mcg by mouth daily.     Multiple Vitamin (MULTIVITAMIN WITH MINERALS) TABS Take 1 tablet by mouth daily.  OLANZapine (ZYPREXA) 5 MG tablet Take 1 tablet (5 mg total) by mouth at bedtime. Qty: 30 tablet, Refills: 6    polyethylene glycol (MIRALAX) packet Take 17 g by mouth 2 (two) times daily. Qty: 60 packet, Refills: 6    potassium chloride SA (K-DUR,KLOR-CON) 20 MEQ tablet Take 20 mEq by mouth daily.    Probiotic Product (ALIGN) 4 MG CAPS Take 4 mg by mouth daily.    valsartan (DIOVAN) 320 MG tablet Take 1 tablet (320 mg total) by mouth daily. Qty: 31 tablet, Refills: 11      STOP taking these medications     ranitidine (ZANTAC) 300 MG tablet          Outstanding Labs/Studies   None  Duration of Discharge Encounter   Greater than 30 minutes including physician time.  Signed, [name] NP-C 12/11/2015, 1:28 PM

## 2015-12-17 ENCOUNTER — Telehealth: Payer: Self-pay | Admitting: Cardiovascular Disease

## 2015-12-17 NOTE — Telephone Encounter (Signed)
New message    pt c/o Leg hurting bigblack and blue bruises. Hurts to walk.  Started after being discharged 22th. Went to bed and woke up the next morning with bruises. Pt was preped for Left Heart Cath and Coronary Angiography. Pt stats that they placed the cath in a different location then what was told to her

## 2015-12-17 NOTE — Telephone Encounter (Signed)
Spoke with pt, she has bruising in the right leg. Explained to pt some bruising is normal after cath. She reports she has had this procedure twice before and has never had this before. She reports her leg is so sore she can not lift it without severe pain. She would like to be seen. Scheduled with NP chris berge tomorrow for groin check.

## 2015-12-18 ENCOUNTER — Ambulatory Visit (INDEPENDENT_AMBULATORY_CARE_PROVIDER_SITE_OTHER): Payer: Medicare Other | Admitting: Physician Assistant

## 2015-12-18 ENCOUNTER — Encounter: Payer: Self-pay | Admitting: Physician Assistant

## 2015-12-18 VITALS — BP 110/68 | HR 71 | Ht 60.0 in | Wt 154.4 lb

## 2015-12-18 DIAGNOSIS — I2510 Atherosclerotic heart disease of native coronary artery without angina pectoris: Secondary | ICD-10-CM

## 2015-12-18 DIAGNOSIS — T148 Other injury of unspecified body region: Secondary | ICD-10-CM

## 2015-12-18 DIAGNOSIS — K219 Gastro-esophageal reflux disease without esophagitis: Secondary | ICD-10-CM

## 2015-12-18 DIAGNOSIS — I10 Essential (primary) hypertension: Secondary | ICD-10-CM | POA: Diagnosis not present

## 2015-12-18 DIAGNOSIS — I5032 Chronic diastolic (congestive) heart failure: Secondary | ICD-10-CM | POA: Diagnosis not present

## 2015-12-18 DIAGNOSIS — T148XXA Other injury of unspecified body region, initial encounter: Secondary | ICD-10-CM

## 2015-12-18 LAB — CBC WITH DIFFERENTIAL/PLATELET
Basophils Absolute: 0 cells/uL (ref 0–200)
Basophils Relative: 0 %
Eosinophils Absolute: 33 cells/uL (ref 15–500)
Eosinophils Relative: 1 %
HCT: 34.6 % — ABNORMAL LOW (ref 35.0–45.0)
Hemoglobin: 10.7 g/dL — ABNORMAL LOW (ref 11.7–15.5)
Lymphocytes Relative: 34 %
Lymphs Abs: 1122 cells/uL (ref 850–3900)
MCH: 23.6 pg — ABNORMAL LOW (ref 27.0–33.0)
MCHC: 30.9 g/dL — ABNORMAL LOW (ref 32.0–36.0)
MCV: 76.2 fL — ABNORMAL LOW (ref 80.0–100.0)
MPV: 10.2 fL (ref 7.5–12.5)
Monocytes Absolute: 627 cells/uL (ref 200–950)
Monocytes Relative: 19 %
Neutro Abs: 1518 cells/uL (ref 1500–7800)
Neutrophils Relative %: 46 %
Platelets: 225 10*3/uL (ref 140–400)
RBC: 4.54 MIL/uL (ref 3.80–5.10)
RDW: 15.4 % — ABNORMAL HIGH (ref 11.0–15.0)
WBC: 3.3 10*3/uL — ABNORMAL LOW (ref 3.8–10.8)

## 2015-12-18 NOTE — Progress Notes (Signed)
Cardiology Office Note    Date:  12/18/2015   ID:  [name], [name] April 27, 1932, MRN [id number]  PCP:  Marton Redwood, MD  Cardiologist:  Dr. Acie Fredrickson  Chief Complaint: Groin/bruise check   History of Present Illness:   [name] is a 82 y.o. female with hx of CAD s/p LHC 2005 small inferoapical MI --> medical therapy, HLD, HTN, Hypothyroidism,  Leuukopenia/anemia followed by Dr. Alvy Bimler, chronic constipation, Acid Reflex s/p nissen fundoplication (123456 dilation (8/14)  and recent admission for chest pain added to schedule for Groin pain.    LHC was back in 2007 where she was noted to have relatively normal coronary arteries and recommendations given to continue with medical therapy.   2D Echo on 11/25/2015 showed EF 55-60% G1DD with NWMA. She presented to the [facility] ED on 12/08/2015 with reports of intermittent chest pain that radiated up into her jaw. Initially it was felt that her symptoms is due to GERD however she continued to have chest pain during admission. Trop x 3 were negative. No improvement on imdur however had headache. She underwent cath with Dr. Angelena Form which showed mild CAD with mid to distal LAD 20% stenosis, 1st diag 30%, and 50%, recommendations for continued medical management. She was discharged on stable condition 12/11/15 on Protonix and discontinued Zantac.   She was added to schedule due to R leg bruise. She had cath through R groin. Her bruise is spreading dow of left leg. She had one episode of chest pain. She described as sharp lasting for few second. She is not taking Protonix, says "PPI does not works for her". She takes Ranitidine 300mg  AM and pepcid PM. No dizziness, SOB, LE edema, orthopnea, PND or syncope. No diarrhea, melena or blood in her stool or urine.    Past Medical History  Diagnosis Date  . Pneumonia   . Hypothyroidism   . Hypertension   . Hyperlipemia   . CAD (coronary artery disease)     a. 2005 small inferoapical  MI --> medical therapy b. 2007 - nomral coronaries c. 11/2015 mid to distal LAD 20% stenosis, 1st diag 30%, and 50%,--> medical therapy    . GERD (gastroesophageal reflux disease)   . Leukocytopenia   . Hypothyroid 09/11/2011  . Osteoporosis 09/11/2011  . Myocardial infarction ([place]) 09/11/2011    Per pt in 2005 (small vessel)  . Arthritis   . Liver mass 09/11/2011    Per report of pt  . Kidney mass 09/11/2011    Per report of pt  . Esophageal abnormality 09/11/2011    Surgically treated per pt.  . Hypercholesterolemia 09/11/2011  . Headache(784.0)   . Cataracts, bilateral 09/11/2011    Per report of pt.  . Insect bite of ankle, left 09/11/2011    back of ankle  . Thigh cramp 09/11/2011    Left thigh x 3 nights since starting unspecified psychotropic medication  . Anemia 06/02/2012  . Mental health disorder   . Anxiety     Past Surgical History  Procedure Laterality Date  . US echocardiography  11/25/2015    Est EF 55-60%  . Cardiovascular stress test  08-15-2007    EF 59%  . Hysterotomy    . Appendectomy    . Cholecystectomy open    . Tonsillectomy    . Knee surgery  09/11/2011  . Gallbladder surgery  09/11/2011  . Abdominal hysterectomy    . Shoulder surgery  09/11/2011    Right side  .  Cardiac catheterization N/A 12/10/2015    Procedure: Left Heart Cath and Coronary Angiography;  Surgeon: Burnell Blanks, MD;  Location: [facility];  Service: Cardiovascular;  Laterality: N/A;    Current Medications: Prior to Admission medications   Medication Sig Start Date End Date Taking? Authorizing Provider  amLODipine (NORVASC) 5 MG tablet Take 1 tablet (5 mg total) by mouth daily. 11/10/15   Thayer Headings, MD  aspirin EC 81 MG tablet Take 81 mg by mouth daily.    Historical Provider, MD  Azelastine HCl 0.15 % SOLN Place 1 spray into both nostrils daily.  06/19/15   Historical Provider, MD  Cholecalciferol 3000 units TABS Take 3,000 Units by mouth daily.    Historical Provider,  MD  cloNIDine (CATAPRES) 0.1 MG tablet Take 0.1 mg by mouth daily.     Historical Provider, MD  docusate sodium (COLACE) 100 MG capsule Take 100 mg by mouth daily as needed for moderate constipation.     Historical Provider, MD  ipratropium (ATROVENT) 0.06 % nasal spray Place 2 sprays into both nostrils as needed for rhinitis.  04/29/14   Historical Provider, MD  levothyroxine (SYNTHROID, LEVOTHROID) 137 MCG tablet Take 137 mcg by mouth daily before breakfast.    Historical Provider, MD  LINZESS 145 MCG CAPS capsule Take 145 mcg by mouth daily.  11/22/13   Historical Provider, MD  Multiple Vitamin (MULTIVITAMIN WITH MINERALS) TABS Take 1 tablet by mouth daily.    Historical Provider, MD  nitroGLYCERIN (NITROSTAT) 0.4 MG SL tablet Place 1 tablet (0.4 mg total) under the tongue every 5 (five) minutes x 3 doses as needed for chest pain. 12/11/15   Cheryln Manly, NP  OLANZapine (ZYPREXA) 5 MG tablet Take 1 tablet (5 mg total) by mouth at bedtime. 10/04/13   Marton Redwood, MD  pantoprazole (PROTONIX) 20 MG tablet Take 1 tablet (20 mg total) by mouth daily. 12/11/15   Cheryln Manly, NP  polyethylene glycol [facility]) packet Take 17 g by mouth 2 (two) times daily. 10/04/13   Marton Redwood, MD  potassium chloride SA (K-DUR,KLOR-CON) 20 MEQ tablet Take 20 mEq by mouth daily.    Historical Provider, MD  Probiotic Product (ALIGN) 4 MG CAPS Take 4 mg by mouth daily.    Historical Provider, MD  traZODone (DESYREL) 100 MG tablet Take 2 tablets (200 mg total) by mouth at bedtime. 12/11/15   Cheryln Manly, NP  valsartan (DIOVAN) 320 MG tablet Take 1 tablet (320 mg total) by mouth daily. 06/26/15   Thayer Headings, MD    Allergies:   Statins; Doxycycline; and Prednisone   Social History   Social History  . Marital Status: Single    Spouse Name: N/A  . Number of Children: N/A  . Years of Education: N/A   Social History Main Topics  . Smoking status: Never Smoker   . Smokeless tobacco: Never Used  .  Alcohol Use: No  . Drug Use: No  . Sexual Activity: Not Currently   Other Topics Concern  . None   Social History Narrative     Family History:  The patient's family history includes Hypertension in her brother and sister.   ROS:   Please see the history of present illness.    ROS All other systems reviewed and are negative.   PHYSICAL EXAM:   VS:  BP 110/68 mmHg  Pulse 71  Ht 5' (1.524 m)  Wt 154 lb 6.4 oz (70.035 kg)  BMI  30.15 kg/m2   GEN: Well nourished, well developed, in no acute distress HEENT: normal Neck: no JVD, carotid bruits, or masses Cardiac: RRR; no murmurs, rubs, or gallops, no edema  Respiratory:  clear to auscultation bilaterally, normal work of breathing GI: soft, nontender, nondistended, + BS MS: no deformity or atrophy Skin: warm and dry. 3-4 cm mild bruise at mid lower abdomen. Mild right groin bruise (not at cath site), bruise at R inferior thigh Neuro:  Alert and Oriented x 3, Strength and sensation are intact Psych: euthymic mood, full affect  Wt Readings from Last 3 Encounters:  12/18/15 154 lb 6.4 oz (70.035 kg)  12/11/15 150 lb 9.6 oz (68.312 kg)  11/10/15 155 lb 6.4 oz (70.489 kg)      Studies/Labs Reviewed:   EKG:  EKG is ordered today.  The ekg ordered today demonstrates NSR @ rate of 68 bpm. No changes.   Recent Labs: 12/08/2015: ALT 14 12/11/2015: BUN 6; Creatinine, Ser 0.73; Hemoglobin 10.0*; Platelets 131*; Potassium 3.7; Sodium 135   Lipid Panel    Component Value Date/Time   CHOL 193 05/13/2014 0757   TRIG 58.0 05/13/2014 0757   HDL 74.60 05/13/2014 0757   CHOLHDL 3 05/13/2014 0757   VLDL 11.6 05/13/2014 0757   LDLCALC 107* 05/13/2014 0757   LDLDIRECT 109.5 03/29/2011 1007    Additional studies/ records that were reviewed today include:   Echocardiogram: 11/25/15 LV EF: 55% - 60%  ------------------------------------------------------------------- Indications: I50.9  CHF.  ------------------------------------------------------------------- History: PMH: Acquired from the patient and from the patient&'s chart. Risk factors: Hypertension. Dyslipidemia.  ------------------------------------------------------------------- Study Conclusions  - Left ventricle: The cavity size was normal. There was mild focal  basal hypertrophy of the septum. Systolic function was normal.  The estimated ejection fraction was in the range of 55% to 60%.  Wall motion was normal; there were no regional wall motion  abnormalities. Doppler parameters are consistent with abnormal  left ventricular relaxation (grade 1 diastolic dysfunction). - Mitral valve: Calcified annulus. There was mild regurgitation. - Left atrium: The atrium was mildly dilated.  Impressions:  - Normal LV function; grade 1 diastolic dysfunction; mild LAE; mild  MR and TR.  Cardiac Catheterization: 12/10/15  Left Heart Cath and Coronary Angiography    Conclusion     Mid LAD to Dist LAD lesion, 20% stenosed.  1st Diag-1 lesion, 30% stenosed.  1st Diag-2 lesion, 50% stenosed.  1. Mild non-obstructive CAD  Recommendations: Continue medical management of CAD     ASSESSMENT & PLAN:   1. Bruise - Cath site stable without hematoma, bruise or bruit. She has bruise on mid lower abdomen, posterior of groin and inferior of leg. She was on heparin SQ Q 8 hours during admission. She has hx of  Leuukopenia/anemia followed by Dr. Alvy Bimler. Will get CBC and stop aspirin. If worsening of bruise/abnormal labs might need evaluation by Dr. Homero Fellers. She is also following with PCP early next week.   2. Chest pain - She had recent reassuring echo and cardiac cath (mild non obstructive CAD). During admission her chest pain felt likely due to GERD and discharged on Protonix however she never started stating that "PPI did not works". Continue Ranitidine 300mg  AM and pepcid PM. She will follow with PCP  early next week for non cardiac evaluation of chest pain. Suspects that her intermittent chest pain is likely due to GI etiology given hx of nissen fundoplication (123456 dilation (8/14). She again denies taking PPI. She might need evaluation by GI (  last seen > 2 years ago).   3. HTN - Stable and well controlled  4. Mild non obstructive CAD.   5. Chronic diastolic CHF - She is euvolemic on exam. No dyspnea.    Medication Adjustments/Labs and Tests Ordered: Current medicines are reviewed at length with the patient today.  Concerns regarding medicines are outlined above.  Medication changes, Labs and Tests ordered today are listed in the Patient Instructions below. Patient Instructions  Medication Instructions:  Your physician has recommended you make the following change in your medication:  1. Stop Asprin   Labwork: Your physician recommends that you have lab work today: CBC   Testing/Procedures: -None  Follow-Up: As needed   Any Other Special Instructions Will Be Listed Below (If Applicable).  Follow up with PCP in a week or so for non cardiac chest pain, might need to see GI doctor.   If you need a refill on your cardiac medications before your next appointment, please call your pharmacy.       Jarrett Soho, PA  12/18/2015 12:00 PM    [facility] [address], [place], [place]  [address] Phone: [phone]; Fax: [phone]

## 2015-12-18 NOTE — Patient Instructions (Signed)
Medication Instructions:  Your physician has recommended you make the following change in your medication:  1. Stop Asprin   Labwork: Your physician recommends that you have lab work today: CBC   Testing/Procedures: -None  Follow-Up: As needed   Any Other Special Instructions Will Be Listed Below (If Applicable).  Follow up with PCP in a week or so for non cardiac chest pain, might need to see GI doctor.   If you need a refill on your cardiac medications before your next appointment, please call your pharmacy.

## 2016-01-09 DIAGNOSIS — E784 Other hyperlipidemia: Secondary | ICD-10-CM | POA: Diagnosis not present

## 2016-01-09 DIAGNOSIS — I10 Essential (primary) hypertension: Secondary | ICD-10-CM | POA: Diagnosis not present

## 2016-01-09 DIAGNOSIS — Z6828 Body mass index (BMI) 28.0-28.9, adult: Secondary | ICD-10-CM | POA: Diagnosis not present

## 2016-01-09 DIAGNOSIS — R58 Hemorrhage, not elsewhere classified: Secondary | ICD-10-CM | POA: Diagnosis not present

## 2016-01-09 DIAGNOSIS — R0782 Intercostal pain: Secondary | ICD-10-CM | POA: Diagnosis not present

## 2016-01-12 ENCOUNTER — Ambulatory Visit: Payer: Self-pay | Admitting: Physician Assistant

## 2016-01-21 ENCOUNTER — Other Ambulatory Visit: Payer: Self-pay | Admitting: *Deleted

## 2016-01-21 MED ORDER — AZELASTINE HCL 0.15 % NA SOLN: 1.0000 | Freq: Every day | NASAL | Status: DC

## 2016-02-05 DIAGNOSIS — F39 Unspecified mood [affective] disorder: Secondary | ICD-10-CM | POA: Diagnosis not present

## 2016-02-05 DIAGNOSIS — F22 Delusional disorders: Secondary | ICD-10-CM | POA: Diagnosis not present

## 2016-02-16 ENCOUNTER — Other Ambulatory Visit: Payer: Self-pay | Admitting: Allergy and Immunology

## 2016-02-24 DIAGNOSIS — I10 Essential (primary) hypertension: Secondary | ICD-10-CM | POA: Diagnosis not present

## 2016-02-24 DIAGNOSIS — I2510 Atherosclerotic heart disease of native coronary artery without angina pectoris: Secondary | ICD-10-CM | POA: Diagnosis not present

## 2016-02-24 DIAGNOSIS — M859 Disorder of bone density and structure, unspecified: Secondary | ICD-10-CM | POA: Diagnosis not present

## 2016-02-24 DIAGNOSIS — E038 Other specified hypothyroidism: Secondary | ICD-10-CM | POA: Diagnosis not present

## 2016-02-24 DIAGNOSIS — R300 Dysuria: Secondary | ICD-10-CM | POA: Diagnosis not present

## 2016-02-24 DIAGNOSIS — Z6828 Body mass index (BMI) 28.0-28.9, adult: Secondary | ICD-10-CM | POA: Diagnosis not present

## 2016-02-24 DIAGNOSIS — N3281 Overactive bladder: Secondary | ICD-10-CM | POA: Diagnosis not present

## 2016-02-24 DIAGNOSIS — E784 Other hyperlipidemia: Secondary | ICD-10-CM | POA: Diagnosis not present

## 2016-03-01 ENCOUNTER — Other Ambulatory Visit: Payer: Self-pay | Admitting: Allergy and Immunology

## 2016-03-09 DIAGNOSIS — R350 Frequency of micturition: Secondary | ICD-10-CM | POA: Diagnosis not present

## 2016-03-09 DIAGNOSIS — N3281 Overactive bladder: Secondary | ICD-10-CM | POA: Diagnosis not present

## 2016-05-12 ENCOUNTER — Ambulatory Visit (INDEPENDENT_AMBULATORY_CARE_PROVIDER_SITE_OTHER): Payer: Medicare Other | Admitting: Cardiovascular Disease

## 2016-05-12 ENCOUNTER — Encounter: Payer: Self-pay | Admitting: Cardiovascular Disease

## 2016-05-12 VITALS — BP 160/80 | HR 72 | Ht 60.0 in | Wt 153.4 lb

## 2016-05-12 DIAGNOSIS — I10 Essential (primary) hypertension: Secondary | ICD-10-CM | POA: Diagnosis not present

## 2016-05-12 DIAGNOSIS — I2510 Atherosclerotic heart disease of native coronary artery without angina pectoris: Secondary | ICD-10-CM | POA: Diagnosis not present

## 2016-05-12 NOTE — Patient Instructions (Signed)
Medication Instructions:  Your physician recommends that you continue on your current medications as directed. Please refer to the Current Medication list given to you today.   Labwork: None Ordered   Testing/Procedures: None Ordered   Follow-Up: Your physician wants you to follow-up in: 6 months with Dr. Nahser.  You will receive a reminder letter in the mail two months in advance. If you don't receive a letter, please call our office to schedule the follow-up appointment.   If you need a refill on your cardiac medications before your next appointment, please call your pharmacy.   Thank you for choosing [facility]! Saleha Kalp, RN [phone]    

## 2016-05-12 NOTE — Progress Notes (Signed)
Cardiology Office Note   Date:  05/12/2016   ID:  [name], [name] 04-Feb-1932, MRN [id number]  PCP:  Marton Redwood, MD  Cardiologist:   Mertie Moores, MD   Chief Complaint  Patient presents with  . Follow-up    cad, HTN   1. Minimal coronary artery disease 2. Hyperlipidemia 3. Chronic cough 4. Hypothyroidism 5. Status post Nissen fundoplication for acid reflux 6. Hypertension  Previous Notes.   [name] is an elderly female with a history of chest pain with minimal coronary artery disease. She has a history of hyperlipidemia chronic cough she status post Nissen fundoplication for acid reflux. She has a history of hypertension.  She's continued to have some episodes of chest discomfort. These are fairly atypical.  Dec. 4, 2013: She has been having upper abdominal / lower chest discomfort pain. The pain is eased with NTG. She does not think the symptoms are due to soft reflux. She stopped her proton pump inhibitor last year. She was concerned about side effects including osteoporosis.  Sept. 12, 2014:  She is doing well from a cardiac standpoint. She continues to have problems with GERD. She has had a nissan fundiplication - helped the cough but now she had recurrent GERD.   She has been gaining weight. Has some numbness on the lateral aspect of her right legs.   November 14, 2013:  She was recently hospitalized for generalized weakness and coughing. She was found to have hyponatremia. She has been feeling well. She bought some compression hose but they were too long ( ended at the bend of her knee) and she could not wear them. She is going to go back to the supply store to get a shorter pair. She denies any CP or dyspnea.   Oct. 26, 2015:  [name] is doing well.  No CP or dyspnea.   November 12, 2014:   [name] is a 82 y.o. female who presents for follow up of her HTN Has occasional palpitations.  Has had some leg pain  - especially in  the morning.  Not worsened with exercise .   Oct. 26, 2016:  Doing well from a cardiac stnandpoint.    November 10, 2015: Complains of being cold No CP or dyspnea.   Oct. 25, 2017:  [name] is seen today for follow up visit  Has a history of minimal coronary artery disease, hypertension and hyperlipidemia.  Had some left sided chest pain this weekend. Took 2 SL NTG - relief of the pain in 1/2 hour. She originally thought it was indigestion,   Tried a pepcid complete  Had a cath in May that revealed only minimal CAD BP is high today  Did not take her BP meds this am.    Past Medical History:  Diagnosis Date  . Anemia 06/02/2012  . Anxiety   . Arthritis   . CAD (coronary artery disease)    a. 2005 small inferoapical MI --> medical therapy b. 2007 - nomral coronaries c. 11/2015 mid to distal LAD 20% stenosis, 1st diag 30%, and 50%,--> medical therapy    . Cataracts, bilateral 09/11/2011   Per report of pt.  . Esophageal abnormality 09/11/2011   Surgically treated per pt.  Marland Kitchen GERD (gastroesophageal reflux disease)   . Headache(784.0)   . Hypercholesterolemia 09/11/2011  . Hyperlipemia   . Hypertension   . Hypothyroid 09/11/2011  . Hypothyroidism   . Insect bite of ankle, left 09/11/2011   back of ankle  .  Kidney mass 09/11/2011   Per report of pt  . Leukocytopenia   . Liver mass 09/11/2011   Per report of pt  . Mental health disorder   . Myocardial infarction 09/11/2011   Per pt in 2005 (small vessel)  . Osteoporosis 09/11/2011  . Pneumonia   . Thigh cramp 09/11/2011   Left thigh x 3 nights since starting unspecified psychotropic medication    Past Surgical History:  Procedure Laterality Date  . ABDOMINAL HYSTERECTOMY    . APPENDECTOMY    . CARDIAC CATHETERIZATION N/A 12/10/2015   Procedure: Left Heart Cath and Coronary Angiography;  Surgeon: Burnell Blanks, MD;  Location: [facility];  Service: Cardiovascular;  Laterality: N/A;  . CARDIOVASCULAR STRESS  TEST  08-15-2007   EF 59%  . CHOLECYSTECTOMY OPEN    . GALLBLADDER SURGERY  09/11/2011  . HYSTEROTOMY    . KNEE SURGERY  09/11/2011  . SHOULDER SURGERY  09/11/2011   Right side  . TONSILLECTOMY    . US ECHOCARDIOGRAPHY  11/25/2015   Est EF 55-60%     Current Outpatient Prescriptions  Medication Sig Dispense Refill  . amLODipine (NORVASC) 10 MG tablet Take 0.5 tablets by mouth daily.    Marland Kitchen aspirin EC 81 MG tablet Take 81 mg by mouth daily.    . Azelastine HCl 0.15 % SOLN Place 1 spray into both nostrils daily. 30 mL 1  . Cholecalciferol 3000 units TABS Take 3,000 Units by mouth daily.    . cloNIDine (CATAPRES) 0.1 MG tablet Take 0.1 mg by mouth daily.     Marland Kitchen docusate sodium (COLACE) 100 MG capsule Take 100 mg by mouth daily as needed for moderate constipation.     . famotidine (PEPCID) 10 MG tablet Take 10 mg by mouth at bedtime.    Marland Kitchen levothyroxine (SYNTHROID, LEVOTHROID) 137 MCG tablet Take 137 mcg by mouth daily before breakfast.    . LINZESS 145 MCG CAPS capsule Take 145 mcg by mouth daily.     . Multiple Vitamin (MULTIVITAMIN WITH MINERALS) TABS Take 1 tablet by mouth daily.    . nitroGLYCERIN (NITROSTAT) 0.4 MG SL tablet Place 1 tablet (0.4 mg total) under the tongue every 5 (five) minutes x 3 doses as needed for chest pain. 25 tablet 3  . OLANZapine (ZYPREXA) 5 MG tablet Take 1 tablet (5 mg total) by mouth at bedtime. 30 tablet 6  . polyethylene glycol (MIRALAX) packet Take 17 g by mouth 2 (two) times daily. 60 packet 6  . potassium chloride SA (K-DUR,KLOR-CON) 20 MEQ tablet Take 20 mEq by mouth daily.    . Probiotic Product (ALIGN) 4 MG CAPS Take 4 mg by mouth daily.    . ranitidine (ZANTAC) 300 MG tablet Take 1 tablet by mouth daily.    . traZODone (DESYREL) 100 MG tablet Take 2 tablets (200 mg total) by mouth at bedtime. 60 tablet 11  . valsartan (DIOVAN) 320 MG tablet Take 1 tablet (320 mg total) by mouth daily. 31 tablet 11   No current facility-administered medications for this  visit.     Allergies:   Statins; Rosuvastatin; Simvastatin; Doxycycline; and Prednisone    Social History:  The patient  reports that she has never smoked. She has never used smokeless tobacco. She reports that she does not drink alcohol or use drugs.   Family History:  The patient's family history includes Hypertension in her brother and sister.    ROS:  Please see the history of present illness.  Review of Systems: Constitutional:  denies fever, chills, diaphoresis, appetite change and fatigue.  HEENT: denies photophobia, eye pain, redness, hearing loss, ear pain, congestion, sore throat, rhinorrhea, sneezing, neck pain, neck stiffness and tinnitus.  Respiratory: denies SOB, DOE, cough, chest tightness, and wheezing.  Cardiovascular: denies chest pain, palpitations and leg swelling.  Gastrointestinal: denies nausea, vomiting, abdominal pain, diarrhea, constipation, blood in stool.  Genitourinary: denies dysuria, urgency, frequency, hematuria, flank pain and difficulty urinating.  Musculoskeletal: denies  myalgias, back pain, joint swelling,  and gait problem.  She does have leg pain in the mornings  Skin: denies pallor, rash and wound.  Neurological: denies dizziness, seizures, syncope, weakness, light-headedness, numbness and headaches.   Hematological: denies adenopathy, easy bruising, personal or family bleeding history.  Psychiatric/ Behavioral: denies suicidal ideation, mood changes, confusion, nervousness, sleep disturbance and agitation.       All other systems are reviewed and negative.    PHYSICAL EXAM: VS:  BP (!) 160/80   Pulse 72   Ht 5' (1.524 m)   Wt 153 lb 6.4 oz (69.6 kg)   SpO2 91%   BMI 29.96 kg/m  , BMI Body mass index is 29.96 kg/m. GEN: Well nourished, well developed, in no acute distress  HEENT: normal  Neck: no JVD, carotid bruits, or masses Cardiac: RRR; 2/6 systolic  Murmur.   No rubs, or gallops,no edema  Respiratory:  clear to auscultation  bilaterally, normal work of breathing GI: soft, nontender, nondistended, + BS MS: no deformity or atrophy ,  Distal foot pulses are quite good.  Skin: warm and dry, no rash Neuro:  Strength and sensation are intact Psych: normal   EKG:  EKG is ordered today. The ekg ordered today demonstrates :  NSR at 66, normal    Recent Labs: 12/08/2015: ALT 14 12/11/2015: BUN 6; Creatinine, Ser 0.73; Potassium 3.7; Sodium 135 12/18/2015: Hemoglobin 10.7; Platelets 225    Lipid Panel    Component Value Date/Time   CHOL 193 05/13/2014 0757   TRIG 58.0 05/13/2014 0757   HDL 74.60 05/13/2014 0757   CHOLHDL 3 05/13/2014 0757   VLDL 11.6 05/13/2014 0757   LDLCALC 107 (H) 05/13/2014 0757   LDLDIRECT 109.5 03/29/2011 1007      Wt Readings from Last 3 Encounters:  05/12/16 153 lb 6.4 oz (69.6 kg)  12/18/15 154 lb 6.4 oz (70 kg)  12/11/15 150 lb 9.6 oz (68.3 kg)      Other studies Reviewed: Additional studies/ records that were reviewed today include: . Review of the above records demonstrates:    ASSESSMENT AND PLAN:  1. Minimal coronary artery disease by cath in May, 2017 - rare episodes of atypical CP . Doing well.  2. Hyperlipidemia -  Managed by her primary medical doctor  3. Chronic cough 4. Hypothyroidism 5. Status post Nissen fundoplication for acid reflux  6. Hypertension - BP is well controlled.   Has some leg edema on occasion.   Will reduce the amlodipine to 5 mg a day .  She will check her BP once a week . Advised her to watch her salt.   Current medicines are reviewed at length with the patient today.  The patient does not have concerns regarding medicines.  The following changes have been made:  no change  Labs/ tests ordered today include:  No orders of the defined types were placed in this encounter.   Disposition:   FU with me in 6 months     Signed, Mertie Moores,  MD  05/12/2016 8:26 AM    [facility] [address], [place],  [place]  [address] Phone: [phone]; Fax: [phone]

## 2016-06-28 ENCOUNTER — Other Ambulatory Visit: Payer: Self-pay | Admitting: Internal Medicine

## 2016-06-28 DIAGNOSIS — Z1231 Encounter for screening mammogram for malignant neoplasm of breast: Secondary | ICD-10-CM

## 2016-07-01 ENCOUNTER — Other Ambulatory Visit: Payer: Self-pay | Admitting: Allergy and Immunology

## 2016-07-01 ENCOUNTER — Other Ambulatory Visit: Payer: Self-pay | Admitting: Internal Medicine

## 2016-07-01 DIAGNOSIS — N644 Mastodynia: Secondary | ICD-10-CM

## 2016-07-06 ENCOUNTER — Ambulatory Visit (INDEPENDENT_AMBULATORY_CARE_PROVIDER_SITE_OTHER): Payer: Medicare Other | Admitting: Allergy and Immunology

## 2016-07-06 ENCOUNTER — Encounter (INDEPENDENT_AMBULATORY_CARE_PROVIDER_SITE_OTHER): Payer: Self-pay

## 2016-07-06 ENCOUNTER — Telehealth: Payer: Self-pay

## 2016-07-06 ENCOUNTER — Encounter: Payer: Self-pay | Admitting: Allergy and Immunology

## 2016-07-06 ENCOUNTER — Telehealth: Payer: Self-pay | Admitting: Allergy and Immunology

## 2016-07-06 VITALS — BP 112/70 | HR 68 | Resp 16

## 2016-07-06 DIAGNOSIS — K219 Gastro-esophageal reflux disease without esophagitis: Secondary | ICD-10-CM

## 2016-07-06 DIAGNOSIS — J3089 Other allergic rhinitis: Secondary | ICD-10-CM

## 2016-07-06 MED ORDER — AZELASTINE HCL 0.15 % NA SOLN: NASAL | 5 refills | Status: DC

## 2016-07-06 NOTE — Telephone Encounter (Signed)
Dr. Jacquelynn Cree office called to give his NPI # [id number], for [facility].

## 2016-07-06 NOTE — Progress Notes (Signed)
Follow-up Note  Referring Provider: Marton Redwood, MD Primary Provider: Marton Redwood, MD Date of Office Visit: 07/06/2016  Subjective:   [name] (DOB: 1931-08-24) is a 82 y.o. female who returns to the [facility] on 07/06/2016 in re-evaluation of the following:  HPI: [name] returns to this clinic in reevaluation of her LPR and rhinitis. I have not seen her in his clinic in approximately one year.  Her reflux has become a little bit more active. She has developed these episodes of chest pain that migrated up into her neck with a frequency of approximately 1 time per week.. This occurs even though she continues to use her ranitidine and has already had a Nissen fundoplication. She has undergone a cardiac catheterization in evaluation of this issue which apparently has been normal. She does not drink any caffeinated drinks but she does eat chocolate on occasion.  Her nasal airway has been okay although she complains about this left nasal bridge pain that has been present for several months and is intermittent in nature. Sometimes she will have epistaxis if she blows her nose relatively hard. She does not have any significant headaches or ugly nasal discharge or recurrent fevers. She continues to use Astepro nose spray.  Allergies as of 07/06/2016      Reactions   Statins Other (See Comments)   Muscle aches   Cholestatin    Rosuvastatin    Other reaction(s): Unknown   Simvastatin    Other reaction(s): Unknown   Doxycycline Itching, Rash   Prednisone Itching   REACTION: itching      Medication List      ALIGN 4 MG Caps Take 4 mg by mouth daily.   amLODipine 10 MG tablet Commonly known as:  NORVASC Take 0.5 tablets by mouth daily.   aspirin EC 81 MG tablet Take 81 mg by mouth daily.   Azelastine HCl 0.15 % Soln USE 1 SPRAY IN EACH NOSTRIL DAILY   Cholecalciferol 3000 units Tabs Take 3,000 Units by mouth daily.   cloNIDine 0.1 MG  tablet Commonly known as:  CATAPRES Take 0.1 mg by mouth daily.   docusate sodium 100 MG capsule Commonly known as:  COLACE Take 100 mg by mouth daily as needed for moderate constipation.   levothyroxine 137 MCG tablet Commonly known as:  SYNTHROID, LEVOTHROID Take 137 mcg by mouth daily before breakfast.   LINZESS 145 MCG Caps capsule Generic drug:  linaclotide Take 145 mcg by mouth daily.   multivitamin with minerals Tabs tablet Take 1 tablet by mouth daily.   MYRBETRIQ PO Take by mouth.   nitroGLYCERIN 0.4 MG SL tablet Commonly known as:  NITROSTAT Place 1 tablet (0.4 mg total) under the tongue every 5 (five) minutes x 3 doses as needed for chest pain.   OLANZapine 5 MG tablet Commonly known as:  ZYPREXA Take 1 tablet (5 mg total) by mouth at bedtime.   polyethylene glycol packet Commonly known as:  MIRALAX Take 17 g by mouth 2 (two) times daily.   potassium chloride SA 20 MEQ tablet Commonly known as:  K-DUR,KLOR-CON Take 20 mEq by mouth daily.   ranitidine 300 MG tablet Commonly known as:  ZANTAC TAKE 1 TABLET BY MOUTH DAILY   traZODone 100 MG tablet Commonly known as:  DESYREL Take 2 tablets (200 mg total) by mouth at bedtime.   valsartan 320 MG tablet Commonly known as:  DIOVAN Take 1 tablet (320 mg total) by mouth daily.  VESICARE PO Take by mouth.       Past Medical History:  Diagnosis Date  . Anemia 06/02/2012  . Anxiety   . Arthritis   . CAD (coronary artery disease)    a. 2005 small inferoapical MI --> medical therapy b. 2007 - nomral coronaries c. 11/2015 mid to distal LAD 20% stenosis, 1st diag 30%, and 50%,--> medical therapy    . Cataracts, bilateral 09/11/2011   Per report of pt.  . Esophageal abnormality 09/11/2011   Surgically treated per pt.  Marland Kitchen GERD (gastroesophageal reflux disease)   . Headache(784.0)   . Hypercholesterolemia 09/11/2011  . Hyperlipemia   . Hypertension   . Hypothyroid 09/11/2011  . Hypothyroidism   . Insect  bite of ankle, left 09/11/2011   back of ankle  . Kidney mass 09/11/2011   Per report of pt  . Leukocytopenia   . Liver mass 09/11/2011   Per report of pt  . Mental health disorder   . Myocardial infarction 09/11/2011   Per pt in 2005 (small vessel)  . Osteoporosis 09/11/2011  . Pneumonia   . Thigh cramp 09/11/2011   Left thigh x 3 nights since starting unspecified psychotropic medication    Past Surgical History:  Procedure Laterality Date  . ABDOMINAL HYSTERECTOMY    . APPENDECTOMY    . CARDIAC CATHETERIZATION N/A 12/10/2015   Procedure: Left Heart Cath and Coronary Angiography;  Surgeon: Burnell Blanks, MD;  Location: [facility];  Service: Cardiovascular;  Laterality: N/A;  . CARDIOVASCULAR STRESS TEST  08-15-2007   EF 59%  . CHOLECYSTECTOMY OPEN    . GALLBLADDER SURGERY  09/11/2011  . HYSTEROTOMY    . KNEE SURGERY  09/11/2011  . SHOULDER SURGERY  09/11/2011   Right side  . TONSILLECTOMY    . US ECHOCARDIOGRAPHY  11/25/2015   Est EF 55-60%    Review of systems negative except as noted in HPI / PMHx or noted below:  Review of Systems  Constitutional: Negative.   HENT: Negative.   Eyes: Negative.   Respiratory: Negative.   Cardiovascular: Negative.   Gastrointestinal: Negative.   Genitourinary: Negative.   Musculoskeletal: Negative.   Skin: Negative.   Neurological: Negative.   Endo/Heme/Allergies: Negative.   Psychiatric/Behavioral: Negative.      Objective:   Vitals:   07/06/16 1035  BP: 112/70  Pulse: 68  Resp: 16          Physical Exam  Constitutional: She is well-developed, well-nourished, and in no distress.  Raspy voice  HENT:  Head: Normocephalic.  Right Ear: Tympanic membrane, external ear and ear canal normal.  Left Ear: Tympanic membrane, external ear and ear canal normal.  Nose: Nose normal. No mucosal edema or rhinorrhea.  Mouth/Throat: Uvula is midline, oropharynx is clear and moist and mucous membranes are normal. No  oropharyngeal exudate.  Eyes: Conjunctivae are normal.  Neck: Trachea normal. No tracheal tenderness present. No tracheal deviation present. No thyromegaly present.  Cardiovascular: Normal rate, regular rhythm, S1 normal, S2 normal and normal heart sounds.   No murmur heard. Pulmonary/Chest: Breath sounds normal. No stridor. No respiratory distress. She has no wheezes. She has no rales.  Musculoskeletal: She exhibits no edema.  Lymphadenopathy:       Head (right side): No tonsillar adenopathy present.       Head (left side): No tonsillar adenopathy present.    She has no cervical adenopathy.  Neurological: She is alert. Gait normal.  Skin: No rash noted. She is  not diaphoretic. No erythema. Nails show no clubbing.  Psychiatric: Mood and affect normal.    Diagnostics: none  Assessment and Plan:   1. LPRD (laryngopharyngeal reflux disease)   2. Other allergic rhinitis     1. Treat reflux with the following:    A. Continue ranitidine 300 mg once a day in PM  B. start samples Dexilant 30 mg once a day in AM  2. Continue Astepro / Azelastine 1 spray each nostril 1-2 times per day if needed  3. Continue over-the-counter antihistamine if needed  4. Revisit with Dr. Redmond Baseman to check left nasal pain issue  5. Return to clinic in 4 weeks or earlier if problem  [name] will have evaluation by her ENT doctor to examine the etiology of her left nasal pain issue that has developed over the course of the past several months. Hopefully this will not be a significant issue. As well, I'm going to have her utilize a combination of a proton pump inhibitor and a H2 receptor blocker to treat what may be reflux-induced chest pain syndrome. I will regroup with her in 4 weeks to assess her response to this approach. Further evaluation treatment based upon her response.  Allena Katz, MD [place]

## 2016-07-06 NOTE — Telephone Encounter (Signed)
Left message with [place] ( patient's PCP- patient has Medicaid) asking for authorization for patient's upcoming appointment at [facility] on Tuesday, January 16th, at 1:00pm.  When [facility] calls back please ask them for referral authorization and number of visits authorized. Diagnosis: Left nasal pain.   Patient has already been informed of appointment date and time.

## 2016-07-06 NOTE — Patient Instructions (Addendum)
  1. Treat reflux with the following:    A. Continue ranitidine 300 mg once a day in PM  B. start samples Dexilant 30 mg once a day in AM  2. Continue Astepro / Azelastine 1 spray each nostril 1-2 times per day if needed  3. Continue over-the-counter antihistamine if needed  4. Revisit with Dr. Redmond Baseman to check left nasal pain issue  5. Return to clinic in 4 weeks or earlier if problem

## 2016-07-07 ENCOUNTER — Ambulatory Visit: Payer: Medicare Other | Admitting: Allergy and Immunology

## 2016-07-07 NOTE — Telephone Encounter (Signed)
Per Dr.Shaw's office, patient is authorized for 6 visits. I called [facility] and informed them of NPI # and number of visits.

## 2016-07-07 NOTE — Telephone Encounter (Signed)
Left message with Dr.Shaw's office asking how many visits are authorized for her referral to [facility].

## 2016-07-07 NOTE — Telephone Encounter (Signed)
Talked with [facility] and gave them the NPI and number of visits authorized by patient's PCP at [facility].

## 2016-07-15 ENCOUNTER — Ambulatory Visit
Admission: RE | Admit: 2016-07-15 | Discharge: 2016-07-15 | Disposition: A | Discharge status: Home / Self Care | Payer: Medicare Other | Source: Ambulatory Visit | Attending: Internal Medicine | Admitting: Internal Medicine

## 2016-07-15 DIAGNOSIS — N644 Mastodynia: Secondary | ICD-10-CM

## 2016-07-17 ENCOUNTER — Other Ambulatory Visit: Payer: Self-pay | Admitting: Cardiovascular Disease

## 2016-07-31 ENCOUNTER — Other Ambulatory Visit: Payer: Self-pay | Admitting: Allergy and Immunology

## 2016-12-26 ENCOUNTER — Other Ambulatory Visit: Payer: Self-pay | Admitting: Allergy and Immunology

## 2016-12-26 ENCOUNTER — Other Ambulatory Visit: Payer: Self-pay | Admitting: Cardiology

## 2017-05-31 ENCOUNTER — Other Ambulatory Visit: Payer: Self-pay | Admitting: Allergy and Immunology

## 2017-05-31 MED ORDER — AZELASTINE HCL 0.15 % NA SOLN: NASAL | 0 refills | Status: DC

## 2017-05-31 NOTE — Telephone Encounter (Signed)
Patient made an appt for 06-14-17. She is requesting a refill on Azelastine from her mail order pharmacy, [facility] and that number is [phone].

## 2017-06-14 ENCOUNTER — Encounter: Payer: Self-pay | Admitting: Allergy and Immunology

## 2017-06-14 ENCOUNTER — Ambulatory Visit (INDEPENDENT_AMBULATORY_CARE_PROVIDER_SITE_OTHER): Payer: Medicare Other | Admitting: Allergy and Immunology

## 2017-06-14 VITALS — BP 128/78 | HR 63 | Resp 17

## 2017-06-14 DIAGNOSIS — K219 Gastro-esophageal reflux disease without esophagitis: Secondary | ICD-10-CM

## 2017-06-14 DIAGNOSIS — J3089 Other allergic rhinitis: Secondary | ICD-10-CM

## 2017-06-14 MED ORDER — AZELASTINE HCL 0.15 % NA SOLN: NASAL | 0 refills | Status: DC

## 2017-06-14 MED ORDER — RANITIDINE HCL 300 MG PO TABS
300.0000 mg | ORAL_TABLET | Freq: Every day | ORAL | 5 refills | Status: DC
Start: 2017-06-14 — End: 2018-06-22

## 2017-06-14 MED ORDER — METHYLPREDNISOLONE ACETATE 80 MG/ML IJ SUSP
80.0000 mg | Freq: Once | INTRAMUSCULAR | Status: AC
  Administered 2017-06-14: 80 mg via INTRAMUSCULAR

## 2017-06-14 NOTE — Progress Notes (Signed)
Follow-up Note  Referring Provider: Marton Redwood, MD Primary Provider: Marton Redwood, MD Date of Office Visit: 06/14/2017  Subjective:   [name] (DOB: 1932-06-24) is a 83 y.o. female who returns to the [facility] on 06/14/2017 in re-evaluation of the following:  HPI: [name] returns to this clinic in reevaluation of respiratory tract problems.  I have not seen her in this clinic since 06 July 2016.  She states that her throat and her nasal issues were doing well until approximately 2 months ago.  Since that point she has had nasal congestion and head fullness and mouth breathing and slight headache without any anosmia or ugly nasal.  She has not been using any nasal steroid but does rely on the use of nasal antihistamine.  She believes that her reflux is under good control at this point.  Currently she uses 300 mg of ranitidine in the morning and adds famotidine at nighttime.  She is very careful about utilizing caffeine or chocolate.  Allergies as of 06/14/2017      Reactions   Statins Other (See Comments)   Muscle aches   Cholestatin    Rosuvastatin    Other reaction(s): Unknown   Simvastatin    Other reaction(s): Unknown   Doxycycline Itching, Rash   Prednisone Itching   REACTION: itching      Medication List      ALIGN 4 MG Caps Take 4 mg by mouth daily.   amLODipine 10 MG tablet Commonly known as:  NORVASC Take 0.5 tablets by mouth daily.   aspirin EC 81 MG tablet Take 81 mg by mouth daily.   Azelastine HCl 0.15 % Soln Can use one spray in each nostril one to two times daily as needed.   Cholecalciferol 3000 units Tabs Take 3,000 Units by mouth daily.   cloNIDine 0.1 MG tablet Commonly known as:  CATAPRES Take 0.1 mg by mouth daily.   docusate sodium 100 MG capsule Commonly known as:  COLACE Take 100 mg by mouth daily as needed for moderate constipation.   hydrochlorothiazide 12.5 MG tablet Commonly known as:   HYDRODIURIL   levothyroxine 137 MCG tablet Commonly known as:  SYNTHROID, LEVOTHROID Take 137 mcg by mouth daily before breakfast.   LINZESS 145 MCG Caps capsule Generic drug:  linaclotide Take 145 mcg by mouth daily.   multivitamin with minerals Tabs tablet Take 1 tablet by mouth daily.   nitroGLYCERIN 0.4 MG SL tablet Commonly known as:  NITROSTAT Place 1 tablet (0.4 mg total) under the tongue every 5 (five) minutes x 3 doses as needed for chest pain.   OLANZapine 5 MG tablet Commonly known as:  ZYPREXA Take 1 tablet (5 mg total) by mouth at bedtime.   polyethylene glycol packet Commonly known as:  MIRALAX Take 17 g by mouth 2 (two) times daily.   potassium chloride SA 20 MEQ tablet Commonly known as:  K-DUR,KLOR-CON Take 20 mEq by mouth daily.   ranitidine 300 MG tablet Commonly known as:  ZANTAC Take 1 tablet (300 mg total) by mouth daily.   traZODone 100 MG tablet Commonly known as:  DESYREL Take 2 tablets (200 mg total) by mouth at bedtime.   valsartan 320 MG tablet Commonly known as:  DIOVAN TAKE 1 TABLET BY MOUTH EVERY DAY       Past Medical History:  Diagnosis Date  . Anemia 06/02/2012  . Anxiety   . Arthritis   . CAD (coronary artery disease)  a. 2005 small inferoapical MI --> medical therapy b. 2007 - nomral coronaries c. 11/2015 mid to distal LAD 20% stenosis, 1st diag 30%, and 50%,--> medical therapy    . Cataracts, bilateral 09/11/2011   Per report of pt.  . Esophageal abnormality 09/11/2011   Surgically treated per pt.  Marland Kitchen GERD (gastroesophageal reflux disease)   . Headache(784.0)   . Hypercholesterolemia 09/11/2011  . Hyperlipemia   . Hypertension   . Hypothyroid 09/11/2011  . Hypothyroidism   . Insect bite of ankle, left 09/11/2011   back of ankle  . Kidney mass 09/11/2011   Per report of pt  . Leukocytopenia   . Liver mass 09/11/2011   Per report of pt  . Mental health disorder   . Myocardial infarction ([place]) 09/11/2011   Per pt in 2005  (small vessel)  . Osteoporosis 09/11/2011  . Pneumonia   . Thigh cramp 09/11/2011   Left thigh x 3 nights since starting unspecified psychotropic medication    Past Surgical History:  Procedure Laterality Date  . ABDOMINAL HYSTERECTOMY    . APPENDECTOMY    . CARDIAC CATHETERIZATION N/A 12/10/2015   Procedure: Left Heart Cath and Coronary Angiography;  Surgeon: Burnell Blanks, MD;  Location: [facility];  Service: Cardiovascular;  Laterality: N/A;  . CARDIOVASCULAR STRESS TEST  08-15-2007   EF 59%  . CHOLECYSTECTOMY OPEN    . GALLBLADDER SURGERY  09/11/2011  . HYSTEROTOMY    . KNEE SURGERY  09/11/2011  . SHOULDER SURGERY  09/11/2011   Right side  . TONSILLECTOMY    . US ECHOCARDIOGRAPHY  11/25/2015   Est EF 55-60%    Review of systems negative except as noted in HPI / PMHx or noted below:  Review of Systems  Constitutional: Negative.   HENT: Negative.   Eyes: Negative.   Respiratory: Negative.   Cardiovascular: Negative.   Gastrointestinal: Negative.   Genitourinary: Negative.   Musculoskeletal: Negative.   Skin: Negative.   Neurological: Negative.   Endo/Heme/Allergies: Negative.   Psychiatric/Behavioral: Negative.      Objective:   Vitals:   06/14/17 1104  BP: 128/78  Pulse: 63  Resp: 17  SpO2: 98%          Physical Exam  Constitutional: She is well-developed, well-nourished, and in no distress.  Raspy voice  HENT:  Head: Normocephalic.  Right Ear: Tympanic membrane, external ear and ear canal normal.  Left Ear: Tympanic membrane, external ear and ear canal normal.  Nose: Nose normal. No mucosal edema or rhinorrhea.  Mouth/Throat: Uvula is midline, oropharynx is clear and moist and mucous membranes are normal. No oropharyngeal exudate.  Eyes: Conjunctivae are normal.  Neck: Trachea normal. No tracheal tenderness present. No tracheal deviation present. No thyromegaly present.  Cardiovascular: Normal rate, regular rhythm, S1 normal and S2  normal.  Murmur (Systolic) heard. Pulmonary/Chest: Breath sounds normal. No stridor. No respiratory distress. She has no wheezes. She has no rales.  Musculoskeletal: She exhibits no edema.  Lymphadenopathy:       Head (right side): No tonsillar adenopathy present.       Head (left side): No tonsillar adenopathy present.    She has no cervical adenopathy.  Neurological: She is alert. Gait normal.  Skin: No rash noted. She is not diaphoretic. No erythema. Nails show no clubbing.  Psychiatric: Mood and affect normal.    Diagnostics: none  Assessment and Plan:   1. Other allergic rhinitis   2. LPRD (laryngopharyngeal reflux disease)  1. Treat reflux with the following:    A. Continue ranitidine 300 mg once a day in AM  B. Continue Famotadine in PM   2. Treat inflammation with the following:   A.  Depomedrol 80 IM delivered in clinic today  B. OTC Nasacort one spray each nostril one time per day  3. If needed:   A.  Azelastine 1 spray each nostril 1-2 times per day   B. Nasal saline multiple times per day  C. over-the-counter antihistamine   4. Return to clinic in 4 weeks or earlier if problem  I am going to address the issue that Schelly has had over the course of the past 2 months with her upper airways by treating her with a systemic steroid and a nasal steroid utilized on a pretty consistent basis.  In addition she will continue to treat her reflux with the therapy noted above.  I will regroup with her in 4 weeks or earlier if there is a problem.  Allena Katz, MD Allergy / Immunology [place]

## 2017-06-14 NOTE — Patient Instructions (Addendum)
  1. Treat reflux with the following:    A. Continue ranitidine 300 mg once a day in AM  B. Continue Famotadine in PM   2. Treat inflammation with the following:   A.  Depomedrol 80 IM delivered in clinic today  B. OTC Nasacort one spray each nostril one time per day  3. If needed:   A.  Azelastine 1 spray each nostril 1-2 times per day   B. Nasal saline multiple times per day  C. over-the-counter antihistamine   4. Return to clinic in 4 weeks or earlier if problem

## 2017-06-15 ENCOUNTER — Encounter: Payer: Self-pay | Admitting: Allergy and Immunology

## 2017-06-16 ENCOUNTER — Telehealth: Payer: Self-pay | Admitting: Student-PharmD

## 2017-06-16 NOTE — Telephone Encounter (Signed)
Received letter from [employer] concerning valsartan recall. Per [employer], patient has been switched to olmesartan 40mg  daily. Med list updated.

## 2017-06-29 ENCOUNTER — Other Ambulatory Visit: Payer: Self-pay | Admitting: Internal Medicine

## 2017-06-29 DIAGNOSIS — Z1231 Encounter for screening mammogram for malignant neoplasm of breast: Secondary | ICD-10-CM

## 2017-07-28 ENCOUNTER — Ambulatory Visit: Payer: Self-pay

## 2017-08-18 ENCOUNTER — Ambulatory Visit
Admission: RE | Admit: 2017-08-18 | Discharge: 2017-08-18 | Disposition: A | Discharge status: Home / Self Care | Payer: Medicare Other | Source: Ambulatory Visit | Attending: Internal Medicine | Admitting: Internal Medicine

## 2017-08-18 DIAGNOSIS — Z1231 Encounter for screening mammogram for malignant neoplasm of breast: Secondary | ICD-10-CM

## 2017-08-19 ENCOUNTER — Other Ambulatory Visit: Payer: Self-pay | Admitting: Internal Medicine

## 2017-08-19 DIAGNOSIS — R928 Other abnormal and inconclusive findings on diagnostic imaging of breast: Secondary | ICD-10-CM

## 2017-08-23 ENCOUNTER — Ambulatory Visit: Payer: Medicare Other | Admitting: Allergy and Immunology

## 2017-09-08 ENCOUNTER — Ambulatory Visit: Admission: RE | Admit: 2017-09-08 | Payer: Self-pay | Source: Ambulatory Visit

## 2017-09-08 ENCOUNTER — Ambulatory Visit
Admission: RE | Admit: 2017-09-08 | Discharge: 2017-09-08 | Disposition: A | Discharge status: Home / Self Care | Payer: Medicare Other | Source: Ambulatory Visit | Attending: Internal Medicine | Admitting: Internal Medicine

## 2017-09-08 DIAGNOSIS — R928 Other abnormal and inconclusive findings on diagnostic imaging of breast: Secondary | ICD-10-CM

## 2017-09-13 ENCOUNTER — Encounter: Payer: Self-pay | Admitting: Allergy and Immunology

## 2017-09-13 ENCOUNTER — Ambulatory Visit (INDEPENDENT_AMBULATORY_CARE_PROVIDER_SITE_OTHER): Payer: Medicare Other | Admitting: Allergy and Immunology

## 2017-09-13 VITALS — BP 140/80 | HR 72 | Resp 16

## 2017-09-13 DIAGNOSIS — J3089 Other allergic rhinitis: Secondary | ICD-10-CM | POA: Diagnosis not present

## 2017-09-13 DIAGNOSIS — K219 Gastro-esophageal reflux disease without esophagitis: Secondary | ICD-10-CM

## 2017-09-13 NOTE — Progress Notes (Signed)
Follow-up Note  Referring Provider: Marton Redwood, MD Primary Provider: Marton Redwood, MD Date of Office Visit: 09/13/2017  Subjective:   [name] (DOB: [birth year]) is a 84 y.o. female who returns to the [facility] on 09/13/2017 in re-evaluation of the following:  HPI: [name] presents to this clinic in evaluation of her persistent respiratory tract symptoms believed secondary to reflux and some degree of upper airway inflammation.  I have not seen her in this clinic since 14 June 2017 at which point in time she was having cough and throat clearing and some rhinorrhea for which we treated her with a systemic steroid and had her continue to address the issue with reflux.  She is really not that much better since that visit.  She is having cough that is disturbing her sleep.  She had a chest x-ray with Dr. Brigitte Pulse in January and was given a Z-Pak.  Apparently her chest x-ray was normal.  She is gagging with her cough and has regurgitation with her cough.  She also has runny nose but without any ugly nasal discharge or anosmia.  Allergies as of 09/13/2017      Reactions   Statins Other (See Comments)   Muscle aches   Cholestatin    Rosuvastatin    Other reaction(s): Unknown   Simvastatin    Other reaction(s): Unknown   Doxycycline Itching, Rash   Prednisone Itching   REACTION: itching      Medication List      ALIGN 4 MG Caps Take 4 mg by mouth daily.   amLODipine 5 MG tablet Commonly known as:  NORVASC   aspirin EC 81 MG tablet Take 81 mg by mouth daily.   Azelastine HCl 0.15 % Soln Can use one spray in each nostril one to two times daily as needed.   benzonatate 100 MG capsule Commonly known as:  TESSALON Take by mouth 3 (three) times daily as needed for cough.   Cholecalciferol 3000 units Tabs Take 3,000 Units by mouth daily.   cloNIDine 0.1 MG tablet Commonly known as:  CATAPRES Take 0.1 mg by mouth daily.   docusate sodium 100 MG  capsule Commonly known as:  COLACE Take 100 mg by mouth daily as needed for moderate constipation.   hydrochlorothiazide 12.5 MG tablet Commonly known as:  HYDRODIURIL   levothyroxine 137 MCG tablet Commonly known as:  SYNTHROID, LEVOTHROID Take 137 mcg by mouth daily before breakfast.   LINZESS 145 MCG Caps capsule Generic drug:  linaclotide Take 145 mcg by mouth daily.   multivitamin with minerals Tabs tablet Take 1 tablet by mouth daily.   nitroGLYCERIN 0.4 MG SL tablet Commonly known as:  NITROSTAT Place 1 tablet (0.4 mg total) under the tongue every 5 (five) minutes x 3 doses as needed for chest pain.   OLANZapine 5 MG tablet Commonly known as:  ZYPREXA Take 1 tablet (5 mg total) by mouth at bedtime.   olmesartan 40 MG tablet Commonly known as:  BENICAR Take 40 mg by mouth daily.   polyethylene glycol packet Commonly known as:  MIRALAX Take 17 g by mouth 2 (two) times daily.   potassium chloride SA 20 MEQ tablet Commonly known as:  K-DUR,KLOR-CON Take 20 mEq by mouth daily.   ranitidine 300 MG tablet Commonly known as:  ZANTAC Take 1 tablet (300 mg total) by mouth daily.   traZODone 100 MG tablet Commonly known as:  DESYREL Take 2 tablets (200 mg total) by  mouth at bedtime.       Past Medical History:  Diagnosis Date  . Anemia 06/02/2012  . Anxiety   . Arthritis   . CAD (coronary artery disease)    a. 2005 small inferoapical MI --> medical therapy b. 2007 - nomral coronaries c. 11/2015 mid to distal LAD 20% stenosis, 1st diag 30%, and 50%,--> medical therapy    . Cataracts, bilateral 09/11/2011   Per report of pt.  . Esophageal abnormality 09/11/2011   Surgically treated per pt.  Marland Kitchen GERD (gastroesophageal reflux disease)   . Headache(784.0)   . Hypercholesterolemia 09/11/2011  . Hyperlipemia   . Hypertension   . Hypothyroid 09/11/2011  . Hypothyroidism   . Insect bite of ankle, left 09/11/2011   back of ankle  . Kidney mass 09/11/2011   Per report of  pt  . Leukocytopenia   . Liver mass 09/11/2011   Per report of pt  . Mental health disorder   . Myocardial infarction ([place]) 09/11/2011   Per pt in 2005 (small vessel)  . Osteoporosis 09/11/2011  . Pneumonia   . Thigh cramp 09/11/2011   Left thigh x 3 nights since starting unspecified psychotropic medication    Past Surgical History:  Procedure Laterality Date  . ABDOMINAL HYSTERECTOMY    . APPENDECTOMY    . BREAST BIOPSY    . BREAST EXCISIONAL BIOPSY    . CARDIAC CATHETERIZATION N/A 12/10/2015   Procedure: Left Heart Cath and Coronary Angiography;  Surgeon: Burnell Blanks, MD;  Location: [facility];  Service: Cardiovascular;  Laterality: N/A;  . CARDIOVASCULAR STRESS TEST  08-15-2007   EF 59%  . CHOLECYSTECTOMY OPEN    . GALLBLADDER SURGERY  09/11/2011  . HYSTEROTOMY    . KNEE SURGERY  09/11/2011  . SHOULDER SURGERY  09/11/2011   Right side  . TONSILLECTOMY    . US ECHOCARDIOGRAPHY  11/25/2015   Est EF 55-60%    Review of systems negative except as noted in HPI / PMHx or noted below:  Review of Systems  Constitutional: Negative.   HENT: Negative.   Eyes: Negative.   Respiratory: Negative.   Cardiovascular: Negative.   Gastrointestinal: Negative.   Genitourinary: Negative.   Musculoskeletal: Negative.   Skin: Negative.   Neurological: Negative.   Endo/Heme/Allergies: Negative.   Psychiatric/Behavioral: Negative.      Objective:   Vitals:   09/13/17 1041  BP: 140/80  Pulse: 72  Resp: 16          Physical Exam  Constitutional: She is well-developed, well-nourished, and in no distress.  Raspy voice  HENT:  Head: Normocephalic.  Right Ear: Tympanic membrane, external ear and ear canal normal.  Left Ear: Tympanic membrane, external ear and ear canal normal.  Nose: Nose normal. No mucosal edema or rhinorrhea.  Mouth/Throat: Uvula is midline, oropharynx is clear and moist and mucous membranes are normal. No oropharyngeal exudate.  Eyes:  Conjunctivae are normal.  Neck: Trachea normal. No tracheal tenderness present. No tracheal deviation present. No thyromegaly present.  Cardiovascular: Normal rate, regular rhythm, S1 normal, S2 normal and normal heart sounds.  No murmur heard. Pulmonary/Chest: Breath sounds normal. No stridor. No respiratory distress. She has no wheezes. She has no rales.  Musculoskeletal: She exhibits no edema.  Lymphadenopathy:       Head (right side): No tonsillar adenopathy present.       Head (left side): No tonsillar adenopathy present.    She has no cervical adenopathy.  Neurological: She  is alert. Gait normal.  Skin: No rash noted. She is not diaphoretic. No erythema. Nails show no clubbing.  Psychiatric: Mood and affect normal.    Diagnostics:    Results of a chest CT scan obtained 02 October 2013 identified the following:  There is no axillary, hilar or mediastinal lymphadenopathy. Calcific coronary and aortic atherosclerosis is noted. No pleural or pericardial effusion. Heart size is normal. There is food material throughout almost the entirety of the esophagus. The esophagus is not dilated. The patient is status post Nissen fundoplication. A 0.4 cm right apical nodule on image 7 is unchanged since 2007. Mild, chronic scarring in the bases is also stable in appearance. The lungs are otherwise unremarkable. Incidentally imaged upper abdomen demonstrates postoperative change of cholecystectomy. No lytic or sclerotic bony lesion is seen. Thoracic spondylosis is noted.  Results of a barium swallow obtained 03 October 2013 identified the following:  The pharyngeal phase of swallowing was only assessed on the frontal projection but appeared relatively normal.  Mildly dilated distal esophagus. Smooth distal esophageal tapering. A 13 mm barium tablet impacted in this area tapering. Maximum distal esophageal caliber was about 6 mm.  There was disruption of the primary peristaltic waves in  the esophagus on 4 out of 4 swallows.  Assessment and Plan:   1. Other allergic rhinitis   2. LPRD (laryngopharyngeal reflux disease)     1. Treat reflux with the following:    A. Start Dexilant 60mg  in AM  B. Continue ranitidine 300 mg once a day - use in evening  2. Treat inflammation with the following:   A.  Dymista - one spray each nostril two times per day  3. If needed:   A. OTC Mucinex DM 1-2 tablets two times per day  B. Nasal saline multiple times per day  C. OTC antihistamine   4. Return to clinic in 4 weeks or earlier if problem  I think that [name] is still having problems with reflux induced respiratory tract irritation.  She has had a Nissen fundoplication but obviously with her history of regurgitation occurring when she coughs it does as appears though gastric material is breaching this surgical barrier and she appears to have significant problems clearing out her esophagus with retention of food products and disruption of her primary peristaltic waves.  She has already had evaluation with Dr. Melvyn Novas in the past for her cough and there does not appear to be a component of hyper response bronchospastic lung disease.  I will treat her aggressively for reflux with the use of Dexilant and ranitidine and she can continue to use anti-inflammatory agents in the form of Dymista for her upper airway and she has several medications that she can use as needed as noted above.  We will see how things go with this plan for the next 4 weeks.  We may need to evaluate her further for her presumed reflux induced cough if she still continues to have problems.    Allena Katz, MD [facility] [place]

## 2017-09-13 NOTE — Patient Instructions (Signed)
  1. Treat reflux with the following:    A. Start Dexilant 60mg  in AM  B. Continue ranitidine 300 mg once a day - use in evening  2. Treat inflammation with the following:   A.  Dymista - one spray each nostril two times per day  3. If needed:   A. OTC Mucinex DM 1-2 tablets two times per day  B. Nasal saline multiple times per day  C. OTC antihistamine   4. Return to clinic in 4 weeks or earlier if problem

## 2017-09-14 MED ORDER — DEXLANSOPRAZOLE 60 MG PO CPDR: 60.0000 mg | DELAYED_RELEASE_CAPSULE | Freq: Every day | ORAL | 5 refills | Status: DC

## 2017-09-14 MED ORDER — AZELASTINE-FLUTICASONE 137-50 MCG/ACT NA SUSP: 1.0000 | Freq: Two times a day (BID) | NASAL | 5 refills | Status: DC

## 2017-09-18 ENCOUNTER — Other Ambulatory Visit: Payer: Self-pay

## 2017-09-18 ENCOUNTER — Encounter (HOSPITAL_COMMUNITY): Payer: Self-pay | Admitting: Emergency Medicine

## 2017-09-18 DIAGNOSIS — E039 Hypothyroidism, unspecified: Secondary | ICD-10-CM | POA: Insufficient documentation

## 2017-09-18 DIAGNOSIS — I10 Essential (primary) hypertension: Secondary | ICD-10-CM | POA: Diagnosis not present

## 2017-09-18 DIAGNOSIS — M79661 Pain in right lower leg: Secondary | ICD-10-CM | POA: Insufficient documentation

## 2017-09-18 DIAGNOSIS — I252 Old myocardial infarction: Secondary | ICD-10-CM | POA: Diagnosis not present

## 2017-09-18 DIAGNOSIS — Z79899 Other long term (current) drug therapy: Secondary | ICD-10-CM | POA: Insufficient documentation

## 2017-09-18 DIAGNOSIS — I2510 Atherosclerotic heart disease of native coronary artery without angina pectoris: Secondary | ICD-10-CM | POA: Insufficient documentation

## 2017-09-18 DIAGNOSIS — Z7982 Long term (current) use of aspirin: Secondary | ICD-10-CM | POA: Diagnosis not present

## 2017-09-18 NOTE — ED Triage Notes (Signed)
Pt c/o 10/10 right leg pain for the past few days that she will like to check if she has a blood clot, pt states she has prior hx of blood clots, denies any recently injury.

## 2017-09-19 ENCOUNTER — Ambulatory Visit (HOSPITAL_BASED_OUTPATIENT_CLINIC_OR_DEPARTMENT_OTHER)
Admission: RE | Admit: 2017-09-19 | Discharge: 2017-09-19 | Disposition: A | Discharge status: Home / Self Care | Payer: Medicare Other | Source: Ambulatory Visit | Attending: Emergency Medicine | Admitting: Emergency Medicine

## 2017-09-19 ENCOUNTER — Emergency Department (HOSPITAL_COMMUNITY)
Admission: EM | Admit: 2017-09-19 | Discharge: 2017-09-19 | Disposition: A | Discharge status: Home / Self Care | Payer: Medicare Other | Attending: Emergency Medicine | Admitting: Emergency Medicine

## 2017-09-19 DIAGNOSIS — M79604 Pain in right leg: Secondary | ICD-10-CM | POA: Insufficient documentation

## 2017-09-19 DIAGNOSIS — M79661 Pain in right lower leg: Secondary | ICD-10-CM

## 2017-09-19 DIAGNOSIS — M79605 Pain in left leg: Secondary | ICD-10-CM | POA: Insufficient documentation

## 2017-09-19 DIAGNOSIS — M79609 Pain in unspecified limb: Secondary | ICD-10-CM | POA: Diagnosis not present

## 2017-09-19 MED ORDER — IBUPROFEN 400 MG PO TABS
400.0000 mg | ORAL_TABLET | Freq: Once | ORAL | Status: AC
  Administered 2017-09-19: 400 mg via ORAL
  Filled 2017-09-19: qty 1

## 2017-09-19 MED ORDER — ENOXAPARIN SODIUM 80 MG/0.8ML ~~LOC~~ SOLN
1.0000 mg/kg | Freq: Once | SUBCUTANEOUS | Status: AC
  Administered 2017-09-19: 70 mg via SUBCUTANEOUS
  Filled 2017-09-19: qty 0.8

## 2017-09-19 MED ORDER — HYDROCODONE-ACETAMINOPHEN 5-325 MG PO TABS
1.0000 | ORAL_TABLET | Freq: Once | ORAL | Status: AC
  Administered 2017-09-19: 1 via ORAL
  Filled 2017-09-19: qty 1

## 2017-09-19 NOTE — Progress Notes (Signed)
VASCULAR LAB PRELIMINARY  PRELIMINARY  PRELIMINARY  PRELIMINARY  Right lower extremity venous duplex completed.    Preliminary report:  There is no DVT or SVT noted in the right lower extremity.   Brendi Mccarroll, RVT 09/19/2017, 8:45 AM

## 2017-09-19 NOTE — ED Provider Notes (Signed)
[facility] EMERGENCY DEPARTMENT Provider Note   CSN: [id number] Arrival date & time: 09/18/17  2047     History   Chief Complaint Chief Complaint  Patient presents with  . Leg Pain    HPI [name] is a 84 y.o. female.  HPI Patient is a 84 year old female presents the emergency department complaints of pain in her right calf.  No significant swelling of her right lower extremity.  She is concerned because she has a history of prior blood clot in her leg.  She is not on anticoagulation at this time.  She denies chest pain shortness of breath.  No fevers or chills.  Her pain started today.  Her pain is mild in severity.  She had no significant improvement in her pain with Tylenol.  No recent injury or trauma.  No numbness or paresthesias of her right lower extremity.   Past Medical History:  Diagnosis Date  . Anemia 06/02/2012  . Anxiety   . Arthritis   . CAD (coronary artery disease)    a. 2005 small inferoapical MI --> medical therapy b. 2007 - nomral coronaries c. 11/2015 mid to distal LAD 20% stenosis, 1st diag 30%, and 50%,--> medical therapy    . Cataracts, bilateral 09/11/2011   Per report of pt.  . Esophageal abnormality 09/11/2011   Surgically treated per pt.  Marland Kitchen GERD (gastroesophageal reflux disease)   . Headache(784.0)   . Hypercholesterolemia 09/11/2011  . Hyperlipemia   . Hypertension   . Hypothyroid 09/11/2011  . Hypothyroidism   . Insect bite of ankle, left 09/11/2011   back of ankle  . Kidney mass 09/11/2011   Per report of pt  . Leukocytopenia   . Liver mass 09/11/2011   Per report of pt  . Mental health disorder   . Myocardial infarction ([place]) 09/11/2011   Per pt in 2005 (small vessel)  . Osteoporosis 09/11/2011  . Pneumonia   . Thigh cramp 09/11/2011   Left thigh x 3 nights since starting unspecified psychotropic medication    Patient Active Problem List   Diagnosis Date Noted  . Precordial pain   . Chest pain 12/08/2015  .  Weight loss 06/07/2014  . Hyponatremia 10/02/2013  . Chronic cough 10/02/2013  . Anxiety disorder 10/02/2013  . Chronic constipation 10/02/2013  . Leukopenia 05/31/2013  . Microcytic anemia 05/31/2013  . Anemia of chronic illness 06/02/2012  . Bruising 01/13/2012  . Muscle ache 01/13/2012  . Hypothyroidism   . CAD (coronary artery disease)   . Leukocytopenia   . Chest pain 04/01/2011  . CONSTIPATION 08/08/2008  . COUGH 07/03/2007  . DEPRESSION 06/26/2007  . HYSTERECTOMY, HX OF 06/26/2007  . Other acquired absence of organ 06/26/2007  . COLON POLYP 09/15/2006  . Hyperlipidemia 09/15/2006  . OBESITY, NOS 09/15/2006  . Essential hypertension, benign 09/15/2006  . CORONARY, ARTERIOSCLEROSIS 09/15/2006  . RHINITIS, ALLERGIC 09/15/2006  . ASTHMA, UNSPECIFIED 09/15/2006  . GASTROESOPHAGEAL REFLUX, NO ESOPHAGITIS 09/15/2006  . DIVERTICULITIS OF COLON, NOS 09/15/2006  . OSTEOARTHRITIS, MULTI SITES 09/15/2006  . INCONTINENCE, URGE 09/15/2006    Past Surgical History:  Procedure Laterality Date  . ABDOMINAL HYSTERECTOMY    . APPENDECTOMY    . BREAST BIOPSY    . BREAST EXCISIONAL BIOPSY    . CARDIAC CATHETERIZATION N/A 12/10/2015   Procedure: Left Heart Cath and Coronary Angiography;  Surgeon: Burnell Blanks, MD;  Location: [facility];  Service: Cardiovascular;  Laterality: N/A;  . CARDIOVASCULAR STRESS TEST  08-15-2007   EF 59%  . CHOLECYSTECTOMY OPEN    . GALLBLADDER SURGERY  09/11/2011  . HYSTEROTOMY    . KNEE SURGERY  09/11/2011  . SHOULDER SURGERY  09/11/2011   Right side  . TONSILLECTOMY    . US ECHOCARDIOGRAPHY  11/25/2015   Est EF 55-60%    OB History    No data available       Home Medications    Prior to Admission medications   Medication Sig Start Date End Date Taking? Authorizing Provider  amLODipine (NORVASC) 5 MG tablet  08/31/17   [provider]  aspirin EC 81 MG tablet Take 81 mg by mouth daily.    [provider]    Azelastine HCl 0.15 % SOLN Can use one spray in each nostril one to two times daily as needed. 06/14/17   Kozlow, Donnamarie Poag, MD  Azelastine-Fluticasone (DYMISTA) 137-50 MCG/ACT SUSP Place 1 spray into both nostrils 2 (two) times daily. 09/14/17   Kozlow, Donnamarie Poag, MD  benzonatate (TESSALON) 100 MG capsule Take by mouth 3 (three) times daily as needed for cough.    [provider]  Cholecalciferol 3000 units TABS Take 3,000 Units by mouth daily.    [provider]  cloNIDine (CATAPRES) 0.1 MG tablet Take 0.1 mg by mouth daily.     [provider]  dexlansoprazole (DEXILANT) 60 MG capsule Take 1 capsule (60 mg total) by mouth daily. 09/14/17   Kozlow, Donnamarie Poag, MD  docusate sodium (COLACE) 100 MG capsule Take 100 mg by mouth daily as needed for moderate constipation.     [provider]  hydrochlorothiazide (HYDRODIURIL) 12.5 MG tablet  05/28/17   [provider]  levothyroxine (SYNTHROID, LEVOTHROID) 137 MCG tablet Take 137 mcg by mouth daily before breakfast.    [provider]  LINZESS 145 MCG CAPS capsule Take 145 mcg by mouth daily.  11/22/13   [provider]  Multiple Vitamin (MULTIVITAMIN WITH MINERALS) TABS Take 1 tablet by mouth daily.    [provider]  nitroGLYCERIN (NITROSTAT) 0.4 MG SL tablet Place 1 tablet (0.4 mg total) under the tongue every 5 (five) minutes x 3 doses as needed for chest pain. 12/11/15   Cheryln Manly, NP  OLANZapine (ZYPREXA) 5 MG tablet Take 1 tablet (5 mg total) by mouth at bedtime. 10/04/13   Marton Redwood, MD  olmesartan (BENICAR) 40 MG tablet Take 40 mg by mouth daily. 05/28/17   [provider]  polyethylene glycol (MIRALAX) packet Take 17 g by mouth 2 (two) times daily. 10/04/13   Marton Redwood, MD  potassium chloride SA (K-DUR,KLOR-CON) 20 MEQ tablet Take 20 mEq by mouth daily.    [provider]  Probiotic Product (ALIGN) 4 MG CAPS Take 4 mg by mouth daily.    [provider]  ranitidine (ZANTAC) 300 MG tablet Take 1 tablet (300 mg total) by mouth daily. 06/14/17   Kozlow, Donnamarie Poag, MD  traZODone (DESYREL) 100 MG tablet Take 2 tablets (200 mg total) by mouth at bedtime. 12/11/15   Cheryln Manly, NP    Family History Family History  Problem Relation Age of Onset  . Hypertension Sister   . Hypertension Brother     Social History Social History   Tobacco Use  . Smoking status: Never Smoker  . Smokeless tobacco: Never Used  Substance Use Topics  . Alcohol use: No  . Drug use: No     Allergies   Statins;  Cholestatin; Rosuvastatin; Simvastatin; Doxycycline; and Prednisone   Review of Systems Review of Systems  All other systems reviewed and are negative.    Physical Exam Updated Vital Signs BP (!) 162/84 (BP Location: Right Arm)   Pulse 63   Temp 98.4 F (36.9 C) (Oral)   Resp 18   Ht 5\' 1"  (1.549 m)   Wt 68 kg (150 lb)   SpO2 100%   BMI 28.34 kg/m   Physical Exam  Constitutional: She is oriented to person, place, and time. She appears well-developed and well-nourished.  HENT:  Head: Normocephalic.  Eyes: EOM are normal.  Neck: Normal range of motion.  Pulmonary/Chest: Effort normal.  Abdominal: She exhibits no distension.  Musculoskeletal:  Normal PT and DP pulse in the right foot.  Full range of motion of right hip, right knee, right ankle.  No swelling of the right lower extremity as compared to the left.  No focal tenderness of the right calf.  No bruising.  No erythema warmth or other signs of infection of the right calf  Neurological: She is alert and oriented to person, place, and time.  Psychiatric: She has a normal mood and affect.  Nursing note and vitals reviewed.    ED Treatments / Results  Labs (all labs ordered are listed, but only abnormal results are displayed) Labs Reviewed - No data to display  EKG  EKG Interpretation None       Radiology No results found.  Procedures Procedures  (including critical care time)  Medications Ordered in ED Medications  enoxaparin (LOVENOX) injection 70 mg (not administered)  ibuprofen (ADVIL,MOTRIN) tablet 400 mg (not administered)  HYDROcodone-acetaminophen (NORCO/VICODIN) 5-325 MG per tablet 1 tablet (not administered)     Initial Impression / Assessment and Plan / ED Course  I have reviewed the triage vital signs and the nursing notes.  Pertinent labs & imaging results that were available during my care of the patient were reviewed by me and considered in my medical decision making (see chart for details).     Single dose Lovenox now.  Pain treated.  No signs to suggest arterial issue.  Patient will return in the a.m. for a venous duplex of her right lower extremity.  No chest pain or shortness of breath to suggest pulmonary embolism.  Vital signs are stable.  Final Clinical Impressions(s) / ED Diagnoses   Final diagnoses:  Right calf pain    ED Discharge Orders        Ordered    LE VENOUS     09/19/17 0101       Jola Schmidt, MD 09/19/17 0107

## 2017-09-19 NOTE — Discharge Instructions (Signed)
IMPORTANT PATIENT INSTRUCTIONS:  You have been scheduled for an Outpatient Vascular Study at [facility].    If tomorrow is a weekday (Monday-Friday), please go to [facility] Admitting Department at 8 am and tell them  you are there for a vascular study.

## 2017-10-11 ENCOUNTER — Encounter: Payer: Self-pay | Admitting: Allergy and Immunology

## 2017-10-11 ENCOUNTER — Ambulatory Visit (INDEPENDENT_AMBULATORY_CARE_PROVIDER_SITE_OTHER): Payer: Medicare Other | Admitting: Allergy and Immunology

## 2017-10-11 VITALS — BP 120/62 | HR 76 | Resp 20

## 2017-10-11 DIAGNOSIS — R05 Cough: Secondary | ICD-10-CM | POA: Diagnosis not present

## 2017-10-11 DIAGNOSIS — J3089 Other allergic rhinitis: Secondary | ICD-10-CM | POA: Diagnosis not present

## 2017-10-11 DIAGNOSIS — R059 Cough, unspecified: Secondary | ICD-10-CM

## 2017-10-11 DIAGNOSIS — K219 Gastro-esophageal reflux disease without esophagitis: Secondary | ICD-10-CM | POA: Diagnosis not present

## 2017-10-11 NOTE — Progress Notes (Signed)
Follow-up Note  Referring Provider: Marton Redwood, MD Primary Provider: Marton Redwood, MD Date of Office Visit: 10/11/2017  Subjective:   [name] (DOB: 13-Jan-1932) is a 84 y.o. female who returns to the [facility] [place] on 10/11/2017 in re-evaluation of the following:  HPI: Rhiana returns to this clinic in reevaluation of her cough.  Her last visit to this clinic was 13 September 2017 at which point in time we established a plan to aggressively treat her reflux.  Because of some insurance issues she has not really been able to utilize aggressive therapy directed against reflux and she is presently using ranitidine and Pepcid in combination and is not using a proton pump inhibitor.  She does continue to utilize therapy directed against upper airway inflammation.  She has noticed no improvement regarding her cough.  She coughs like crazy especially at nighttime and she cannot sleep at nighttime.  She does appear to have regurgitation and gagging with this issue.  She does have runny nose but no other significant upper airway symptoms.  Previous evaluation has documented very significant reflux disease that has been treated with a Nissen fundoplication and there has been post surgery documentation of food material retained within her esophagus.  She eats at around 5:30 at night and goes to bed around 7:30 at night and she does have the head of the bed elevated and does not really consume any caffeine to any large degree.  Allergies as of 10/11/2017      Reactions   Statins Other (See Comments)   Muscle aches   Cholestatin    Rosuvastatin    Other reaction(s): Unknown   Simvastatin    Other reaction(s): Unknown   Doxycycline Itching, Rash   Prednisone Itching   REACTION: itching      Medication List      ALIGN 4 MG Caps Take 4 mg by mouth daily.   amLODipine 5 MG tablet Commonly known as:  NORVASC   aspirin EC 81 MG tablet Take 81 mg by mouth daily.     Azelastine HCl 0.15 % Soln Can use one spray in each nostril one to two times daily as needed.   benzonatate 100 MG capsule Commonly known as:  TESSALON Take by mouth 3 (three) times daily as needed for cough.   Cholecalciferol 3000 units Tabs Take 3,000 Units by mouth daily.   cloNIDine 0.1 MG tablet Commonly known as:  CATAPRES Take 0.1 mg by mouth daily.   docusate sodium 100 MG capsule Commonly known as:  COLACE Take 100 mg by mouth daily as needed for moderate constipation.   famotidine 20 MG tablet Commonly known as:  PEPCID Take 20 mg by mouth at bedtime.   hydrochlorothiazide 12.5 MG tablet Commonly known as:  HYDRODIURIL   levothyroxine 137 MCG tablet Commonly known as:  SYNTHROID, LEVOTHROID Take 137 mcg by mouth daily before breakfast.   LINZESS 145 MCG Caps capsule Generic drug:  linaclotide Take 145 mcg by mouth daily.   multivitamin with minerals Tabs tablet Take 1 tablet by mouth daily.   nitroGLYCERIN 0.4 MG SL tablet Commonly known as:  NITROSTAT Place 1 tablet (0.4 mg total) under the tongue every 5 (five) minutes x 3 doses as needed for chest pain.   OLANZapine 5 MG tablet Commonly known as:  ZYPREXA Take 1 tablet (5 mg total) by mouth at bedtime.   olmesartan 40 MG tablet Commonly known as:  BENICAR Take 40 mg by mouth  daily.   polyethylene glycol packet Commonly known as:  MIRALAX Take 17 g by mouth 2 (two) times daily.   potassium chloride SA 20 MEQ tablet Commonly known as:  K-DUR,KLOR-CON Take 20 mEq by mouth daily.   ranitidine 300 MG tablet Commonly known as:  ZANTAC Take 1 tablet (300 mg total) by mouth daily.   traZODone 100 MG tablet Commonly known as:  DESYREL Take 2 tablets (200 mg total) by mouth at bedtime.       Past Medical History:  Diagnosis Date  . Anemia 06/02/2012  . Anxiety   . Arthritis   . CAD (coronary artery disease)    a. 2005 small inferoapical MI --> medical therapy b. 2007 - nomral coronaries  c. 11/2015 mid to distal LAD 20% stenosis, 1st diag 30%, and 50%,--> medical therapy    . Cataracts, bilateral 09/11/2011   Per report of pt.  . Esophageal abnormality 09/11/2011   Surgically treated per pt.  Marland Kitchen GERD (gastroesophageal reflux disease)   . Headache(784.0)   . Hypercholesterolemia 09/11/2011  . Hyperlipemia   . Hypertension   . Hypothyroid 09/11/2011  . Hypothyroidism   . Insect bite of ankle, left 09/11/2011   back of ankle  . Kidney mass 09/11/2011   Per report of pt  . Leukocytopenia   . Liver mass 09/11/2011   Per report of pt  . Mental health disorder   . Myocardial infarction ([place]) 09/11/2011   Per pt in 2005 (small vessel)  . Osteoporosis 09/11/2011  . Pneumonia   . Thigh cramp 09/11/2011   Left thigh x 3 nights since starting unspecified psychotropic medication    Past Surgical History:  Procedure Laterality Date  . ABDOMINAL HYSTERECTOMY    . APPENDECTOMY    . BREAST BIOPSY    . BREAST EXCISIONAL BIOPSY    . CARDIAC CATHETERIZATION N/A 12/10/2015   Procedure: Left Heart Cath and Coronary Angiography;  Surgeon: Burnell Blanks, MD;  Location: [facility];  Service: Cardiovascular;  Laterality: N/A;  . CARDIOVASCULAR STRESS TEST  08-15-2007   EF 59%  . CHOLECYSTECTOMY OPEN    . GALLBLADDER SURGERY  09/11/2011  . HYSTEROTOMY    . KNEE SURGERY  09/11/2011  . SHOULDER SURGERY  09/11/2011   Right side  . TONSILLECTOMY    . US ECHOCARDIOGRAPHY  11/25/2015   Est EF 55-60%    Review of systems negative except as noted in HPI / PMHx or noted below:  Review of Systems  Constitutional: Negative.   HENT: Negative.   Eyes: Negative.   Respiratory: Negative.   Cardiovascular: Negative.   Gastrointestinal: Negative.   Genitourinary: Negative.   Musculoskeletal: Negative.   Skin: Negative.   Neurological: Negative.   Endo/Heme/Allergies: Negative.   Psychiatric/Behavioral: Negative.      Objective:   Vitals:   10/11/17 1158  BP: 120/62  Pulse:  76  Resp: 20          Physical Exam  Constitutional: She is well-developed, well-nourished, and in no distress.  Raspy voice  HENT:  Head: Normocephalic.  Right Ear: Tympanic membrane, external ear and ear canal normal.  Left Ear: Tympanic membrane, external ear and ear canal normal.  Nose: Nose normal. No mucosal edema or rhinorrhea.  Mouth/Throat: Uvula is midline, oropharynx is clear and moist and mucous membranes are normal. No oropharyngeal exudate.  Eyes: Conjunctivae are normal.  Neck: Trachea normal. No tracheal tenderness present. No tracheal deviation present. No thyromegaly present.  Cardiovascular: Normal  rate, regular rhythm, S1 normal, S2 normal and normal heart sounds.  No murmur heard. Pulmonary/Chest: Breath sounds normal. No stridor. No respiratory distress. She has no wheezes. She has no rales.  Musculoskeletal: She exhibits no edema.  Lymphadenopathy:       Head (right side): No tonsillar adenopathy present.       Head (left side): No tonsillar adenopathy present.    She has no cervical adenopathy.  Neurological: She is alert. Gait normal.  Skin: No rash noted. She is not diaphoretic. No erythema. Nails show no clubbing.  Psychiatric: Mood and affect normal.    Diagnostics: none  Assessment and Plan:   1. Other allergic rhinitis   2. LPRD (laryngopharyngeal reflux disease)   3. Cough      1. Treat reflux with the following:    A. Samples Dexilant 60mg  one time per day  B. Continue ranitidine 300 mg once a day - use in evening  2. Treat inflammation with the following:   A.  Azelastine - 1 spray each nostril 2 times per day  B. Fluticasone - 1 spray each nostril 2 times per day  3. If needed:   A. OTC Mucinex DM 1-2 tablets two times per day  B. Nasal saline multiple times per day  C. OTC antihistamine   4. Obtain a HRCT chest for cough  5. Obtain a sinus CT scan  6. ENT evaluation to examine throat  7. Return to clinic in 4 weeks.    I will image Harriet airway to make sure were not dealing with some form of interstitial lung disease or chronic sinusitis contributing to her cough and will have her evaluated by an ENT to make sure were not dealing with some significant problem involving her throat that is also contributing to her cough.  She will aggressively treat reflux and inflammation of her upper airway as noted above.  We may need to have her reevaluated by GI if she continues to have problems and we cannot identify a etiologic agent responsible for her coughing other than reflux.  She very well could have a problem with reflux contributing to her cough or possibly an issue with esophageal dysmotility, whether that be iatrogenic from her Nissen fundoplication or from some other cause and we will consider further evaluation for those issues pending her response to therapy noted above and the results of her diagnostic testing noted above.  Allena Katz, MD Allergy / Immunology [place]

## 2017-10-11 NOTE — Patient Instructions (Addendum)
  1. Treat reflux with the following:    A. Samples Dexilant 60mg  one time per day  B. Continue ranitidine 300 mg once a day - use in evening  2. Treat inflammation with the following:   A.  Azelastine - 1 spray each nostril 2 times per day  B. Fluticasone - 1 spray each nostril 2 times per day  3. If needed:   A. OTC Mucinex DM 1-2 tablets two times per day  B. Nasal saline multiple times per day  C. OTC antihistamine   4. Obtain a HRCT chest for cough  5. Obtain a sinus CT scan  6. ENT evaluation to examine throat  7. Return to clinic in 4 weeks.

## 2017-10-12 ENCOUNTER — Encounter: Payer: Self-pay | Admitting: Allergy and Immunology

## 2017-10-17 ENCOUNTER — Ambulatory Visit (HOSPITAL_COMMUNITY): Admission: RE | Admit: 2017-10-17 | Payer: Medicare Other | Source: Ambulatory Visit

## 2017-10-17 ENCOUNTER — Ambulatory Visit (HOSPITAL_COMMUNITY): Payer: Medicare Other | Attending: Allergy and Immunology

## 2017-10-17 ENCOUNTER — Telehealth: Payer: Self-pay

## 2017-10-17 NOTE — Telephone Encounter (Signed)
Referral Faxed to Dr Velvet Bathe office.

## 2017-10-17 NOTE — Telephone Encounter (Signed)
-----   Message from Kenneth, LPN sent at 3/52/4818 12:29 PM EDT ----- Regarding: referral Pt needs referral to ENT for evaluation of throat. Thank you

## 2017-11-08 ENCOUNTER — Ambulatory Visit: Payer: Medicare Other | Admitting: Allergy and Immunology

## 2017-11-11 NOTE — Telephone Encounter (Signed)
Patient decided to go to Dr bates Office.

## 2017-11-29 ENCOUNTER — Encounter: Payer: Self-pay | Admitting: Allergy and Immunology

## 2017-11-29 ENCOUNTER — Ambulatory Visit (INDEPENDENT_AMBULATORY_CARE_PROVIDER_SITE_OTHER): Payer: Medicare Other | Admitting: Allergy and Immunology

## 2017-11-29 VITALS — BP 118/70 | HR 80 | Resp 16

## 2017-11-29 DIAGNOSIS — R05 Cough: Secondary | ICD-10-CM | POA: Diagnosis not present

## 2017-11-29 DIAGNOSIS — J3089 Other allergic rhinitis: Secondary | ICD-10-CM | POA: Diagnosis not present

## 2017-11-29 DIAGNOSIS — K219 Gastro-esophageal reflux disease without esophagitis: Secondary | ICD-10-CM

## 2017-11-29 DIAGNOSIS — R059 Cough, unspecified: Secondary | ICD-10-CM

## 2017-11-29 NOTE — Progress Notes (Signed)
Follow-up Note  Referring Provider: Marton Redwood, MD Primary Provider: Marton Redwood, MD Date of Office Visit: 11/29/2017  Subjective:   [name] (DOB: Aug 12, 1931) is a 84 y.o. female who returns to the [facility] [place] [facility] on 11/29/2017 in re-evaluation of the following:  HPI: [name] returns to this clinic in evaluation of cough.  I last saw her in this clinic on 11 October 2017 at which point in time I made the recommendation that she treat her reflux aggressively, treat upper airway inflammation aggressively, have a sinus CT scan, have an ENT evaluation, and obtain a chest CT scan.  She did visit with Dr. Redmond Baseman, ENT, who performed a CT scan which identified a small amount of fluid in her maxillary sinus and treated her with 10 days of antibiotics.  She did not have her chest CT scan performed.  She did not take the proton pump inhibitor because her general surgeon who performed her Nissen fundoplication told her to never take a proton pump inhibitor again.  She does continue on Pepcid at nighttime.  She is the same.  She has cough.  She coughs during the daytime and nighttime and its interfering with her sleep.  She does have regurgitation and gagging.  Her upper airway issue is doing very well.  Allergies as of 11/29/2017      Reactions   Statins Other (See Comments)   Muscle aches   Cholestatin    Rosuvastatin    Other reaction(s): Unknown   Simvastatin    Other reaction(s): Unknown   Doxycycline Itching, Rash   Prednisone Itching   REACTION: itching      Medication List      ALIGN 4 MG Caps Take 4 mg by mouth daily.   amLODipine 5 MG tablet Commonly known as:  NORVASC   aspirin EC 81 MG tablet Take 81 mg by mouth daily.   Azelastine HCl 0.15 % Soln Can use one spray in each nostril one to two times daily as needed.   benzonatate 100 MG capsule Commonly known as:  TESSALON Take by mouth 3 (three) times daily as needed for cough.     Cholecalciferol 3000 units Tabs Take 3,000 Units by mouth daily.   cloNIDine 0.1 MG tablet Commonly known as:  CATAPRES Take 0.1 mg by mouth daily.   docusate sodium 100 MG capsule Commonly known as:  COLACE Take 100 mg by mouth daily as needed for moderate constipation.   ezetimibe 10 MG tablet Commonly known as:  ZETIA   famotidine 20 MG tablet Commonly known as:  PEPCID Take 20 mg by mouth at bedtime.   hydrochlorothiazide 12.5 MG tablet Commonly known as:  HYDRODIURIL   levothyroxine 137 MCG tablet Commonly known as:  SYNTHROID, LEVOTHROID Take 137 mcg by mouth daily before breakfast.   LINZESS 145 MCG Caps capsule Generic drug:  linaclotide Take 145 mcg by mouth daily.   multivitamin with minerals Tabs tablet Take 1 tablet by mouth daily.   nitroGLYCERIN 0.4 MG SL tablet Commonly known as:  NITROSTAT Place 1 tablet (0.4 mg total) under the tongue every 5 (five) minutes x 3 doses as needed for chest pain.   OLANZapine 5 MG tablet Commonly known as:  ZYPREXA Take 1 tablet (5 mg total) by mouth at bedtime.   olmesartan 40 MG tablet Commonly known as:  BENICAR Take 40 mg by mouth daily.   polyethylene glycol packet Commonly known as:  MIRALAX Take 17 g by mouth  2 (two) times daily.   potassium chloride SA 20 MEQ tablet Commonly known as:  K-DUR,KLOR-CON Take 20 mEq by mouth daily.   ranitidine 300 MG tablet Commonly known as:  ZANTAC Take 1 tablet (300 mg total) by mouth daily.   traZODone 100 MG tablet Commonly known as:  DESYREL Take 2 tablets (200 mg total) by mouth at bedtime.       Past Medical History:  Diagnosis Date  . Anemia 06/02/2012  . Anxiety   . Arthritis   . CAD (coronary artery disease)    a. 2005 small inferoapical MI --> medical therapy b. 2007 - nomral coronaries c. 11/2015 mid to distal LAD 20% stenosis, 1st diag 30%, and 50%,--> medical therapy    . Cataracts, bilateral 09/11/2011   Per report of pt.  . Esophageal  abnormality 09/11/2011   Surgically treated per pt.  Marland Kitchen GERD (gastroesophageal reflux disease)   . Headache(784.0)   . Hypercholesterolemia 09/11/2011  . Hyperlipemia   . Hypertension   . Hypothyroid 09/11/2011  . Hypothyroidism   . Insect bite of ankle, left 09/11/2011   back of ankle  . Kidney mass 09/11/2011   Per report of pt  . Leukocytopenia   . Liver mass 09/11/2011   Per report of pt  . Mental health disorder   . Myocardial infarction ([place]) 09/11/2011   Per pt in 2005 (small vessel)  . Osteoporosis 09/11/2011  . Pneumonia   . Thigh cramp 09/11/2011   Left thigh x 3 nights since starting unspecified psychotropic medication    Past Surgical History:  Procedure Laterality Date  . ABDOMINAL HYSTERECTOMY    . APPENDECTOMY    . BREAST BIOPSY    . BREAST EXCISIONAL BIOPSY    . CARDIAC CATHETERIZATION N/A 12/10/2015   Procedure: Left Heart Cath and Coronary Angiography;  Surgeon: Burnell Blanks, MD;  Location: [facility];  Service: Cardiovascular;  Laterality: N/A;  . CARDIOVASCULAR STRESS TEST  08-15-2007   EF 59%  . CHOLECYSTECTOMY OPEN    . GALLBLADDER SURGERY  09/11/2011  . HYSTEROTOMY    . KNEE SURGERY  09/11/2011  . SHOULDER SURGERY  09/11/2011   Right side  . TONSILLECTOMY    . US ECHOCARDIOGRAPHY  11/25/2015   Est EF 55-60%    Review of systems negative except as noted in HPI / PMHx or noted below:  Review of Systems  Constitutional: Negative.   HENT: Negative.   Eyes: Negative.   Respiratory: Negative.   Cardiovascular: Negative.   Gastrointestinal: Negative.   Genitourinary: Negative.   Musculoskeletal: Negative.   Skin: Negative.   Neurological: Negative.   Endo/Heme/Allergies: Negative.   Psychiatric/Behavioral: Negative.      Objective:   Vitals:   11/29/17 1556  BP: 118/70  Pulse: 80  Resp: 16          Physical Exam  Constitutional:  Raspy voice  HENT:  Head: Normocephalic.  Right Ear: Tympanic membrane, external ear and ear  canal normal.  Left Ear: External ear normal.  Nose: Nose normal. No mucosal edema or rhinorrhea.  Mouth/Throat: Uvula is midline, oropharynx is clear and moist and mucous membranes are normal. No oropharyngeal exudate.  Wax left ear canal  Eyes: Conjunctivae are normal.  Neck: Trachea normal. No tracheal tenderness present. No tracheal deviation present. No thyromegaly present.  Cardiovascular: Normal rate, regular rhythm, S1 normal, S2 normal and normal heart sounds.  No murmur heard. Pulmonary/Chest: Breath sounds normal. No stridor. No respiratory distress.  She has no wheezes. She has no rales.  Musculoskeletal: She exhibits no edema.  Lymphadenopathy:       Head (right side): No tonsillar adenopathy present.       Head (left side): No tonsillar adenopathy present.    She has no cervical adenopathy.  Neurological: She is alert.  Skin: No rash noted. She is not diaphoretic. No erythema. Nails show no clubbing.    Diagnostics:    Results of a sinus CT scan obtained 26 October 2017 identified the following:  1. Normally aerated paranasal sinuses aside from a layering left maxillary sinus fluid level. Patent sinus drainage pathways, accessory bilateral maxillary sinus drainage pathways. 2. Hyperplastic sphenoid and frontal sinuses.  Assessment and Plan:   1. Other allergic rhinitis   2. LPRD (laryngopharyngeal reflux disease)   3. Cough     1. Treat reflux with the following:    A. Omeprazole 40mg  one time per day  B. Continue Pepcid in evening  2. Treat inflammation with the following:   A.  Azelastine - 1 spray each nostril 2 times per day  B. Fluticasone - 1 spray each nostril 2 times per day  3. If needed:   A. OTC Mucinex DM 1-2 tablets two times per day  B. Nasal saline multiple times per day  C. OTC antihistamine   4.  Revisit with GI doctor for further evaluation concerning possible Nissen fundoplication defect with return of reflux causing a cough  5. Return  to clinic in 8 weeks.   I think we need to have [name] be a little more aggressive about treating reflux and I have encouraged her to use a proton pump inhibitor along with her Pepcid.  I do not know if she will use a proton pump inhibitor as she has some fear of using this because her surgeon told her 8 years ago to never use a proton pump inhibitor.  We will continue to have her use anti-inflammatory agents for her upper airway.  I think she needs to revisit with her GI doctor to further investigate whether or not her Nissen fundoplication has failed.  I will see her back in this clinic in 8 weeks.  Allena Katz, MD Allergy / Immunology [place]

## 2017-11-29 NOTE — Patient Instructions (Addendum)
  1. Treat reflux with the following:    A. Omeprazole 40mg  one time per day  B. Continue Pepcid in evening  2. Treat inflammation with the following:   A.  Azelastine - 1 spray each nostril 2 times per day  B. Fluticasone - 1 spray each nostril 2 times per day  3. If needed:   A. OTC Mucinex DM 1-2 tablets two times per day  B. Nasal saline multiple times per day  C. OTC antihistamine   4.  Revisit with GI doctor for further evaluation concerning possible Nissen fundoplication defect with return of reflux causing a cough  5. Return to clinic in 8 weeks.

## 2017-11-30 ENCOUNTER — Encounter: Payer: Self-pay | Admitting: Allergy and Immunology

## 2017-12-01 ENCOUNTER — Telehealth: Payer: Self-pay

## 2017-12-01 NOTE — Telephone Encounter (Signed)
Referral placed.

## 2017-12-01 NOTE — Telephone Encounter (Signed)
-----   Message from [place], LPN sent at 5/36/4680  4:45 PM EDT ----- Regarding: referral Pt needs referral to a GI doc for LPR/ GERD. Possible Nissen Fundoplication defect with return of reflux and cough. Thank you.

## 2017-12-02 ENCOUNTER — Encounter: Payer: Self-pay | Admitting: Gastroenterology

## 2018-02-15 ENCOUNTER — Ambulatory Visit (INDEPENDENT_AMBULATORY_CARE_PROVIDER_SITE_OTHER): Payer: Medicare Other | Admitting: Gastroenterology

## 2018-02-15 ENCOUNTER — Other Ambulatory Visit (INDEPENDENT_AMBULATORY_CARE_PROVIDER_SITE_OTHER): Payer: Medicare Other

## 2018-02-15 ENCOUNTER — Encounter: Payer: Self-pay | Admitting: Gastroenterology

## 2018-02-15 VITALS — BP 112/76 | HR 63 | Ht 60.75 in | Wt 152.0 lb

## 2018-02-15 DIAGNOSIS — R05 Cough: Secondary | ICD-10-CM

## 2018-02-15 DIAGNOSIS — R140 Abdominal distension (gaseous): Secondary | ICD-10-CM

## 2018-02-15 DIAGNOSIS — R059 Cough, unspecified: Secondary | ICD-10-CM

## 2018-02-15 DIAGNOSIS — K219 Gastro-esophageal reflux disease without esophagitis: Secondary | ICD-10-CM | POA: Diagnosis not present

## 2018-02-15 DIAGNOSIS — R1084 Generalized abdominal pain: Secondary | ICD-10-CM | POA: Diagnosis not present

## 2018-02-15 DIAGNOSIS — R1310 Dysphagia, unspecified: Secondary | ICD-10-CM

## 2018-02-15 LAB — BUN: BUN: 9 mg/dL (ref 6–23)

## 2018-02-15 LAB — CREATININE, SERUM: Creatinine, Ser: 0.82 mg/dL (ref 0.40–1.20)

## 2018-02-15 NOTE — Patient Instructions (Addendum)
Your provider has requested that you go to the basement level for lab work before leaving today. Press "B" on the elevator. The lab is located at the first door on the left as you exit the elevator.   You have been scheduled for a CT scan of the abdomen and pelvis at [facility] ([address]---this is in the same building as [facility]).   You are scheduled on 02/23/18 at 2:00pm. You should arrive 15 minutes prior to your appointment time for registration. Please follow the written instructions below on the day of your exam:  WARNING: IF YOU ARE ALLERGIC TO IODINE/X-RAY DYE, PLEASE NOTIFY RADIOLOGY IMMEDIATELY AT [phone]! YOU WILL BE GIVEN A 13 HOUR PREMEDICATION PREP.  1) Do not eat or drink anything after 10:00am (4 hours prior to your test) 2) You have been given 2 bottles of oral contrast to drink. The solution may taste better if refrigerated, but do NOT add ice or any other liquid to this solution. Shake well before drinking.    Drink 1 bottle of contrast @ 12:00pm (2 hours prior to your exam)  Drink 1 bottle of contrast @ 1:00pm (1 hour prior to your exam)  You may take any medications as prescribed with a small amount of water except for the following: Metformin, Glucophage, Glucovance, Avandamet, Riomet, Fortamet, Actoplus Met, Janumet, Glumetza or Metaglip. The above medications must be held the day of the exam AND 48 hours after the exam.  The purpose of you drinking the oral contrast is to aid in the visualization of your intestinal tract. The contrast solution may cause some diarrhea. Before your exam is started, you will be given a small amount of fluid to drink. Depending on your individual set of symptoms, you may also receive an intravenous injection of x-ray contrast/dye. Plan on being at [facility] for 30 minutes or longer, depending on the type of exam you are having performed.  This test typically takes 30-45 minutes to complete.  If you  have any questions regarding your exam or if you need to reschedule, you may call the CT department at [phone] between the hours of 8:00 am and 5:00 pm, Monday-Friday.  ________________________________________________________________________  [name] have been scheduled for an Upper GI Series at [facility]. Your appointment is on 03/01/18 at 10:30am. Please arrive 15 minutes prior to your test for registration. Make sure not to eat or drink anything after midnight on the night before your test. If you need to reschedule, please call radiology at [phone]. ________________________________________________________________ An upper GI series uses x rays to help diagnose problems of the upper GI tract, which includes the esophagus, stomach, and duodenum. The duodenum is the first part of the small intestine. An upper GI series is conducted by a radiology technologist or a radiologist-a doctor who specializes in x-ray imaging-at a hospital or outpatient center. While sitting or standing in front of an x-ray machine, the patient drinks barium liquid, which is often white and has a chalky consistency and taste. The barium liquid coats the lining of the upper GI tract and makes signs of disease show up more clearly on x rays. X-ray video, called fluoroscopy, is used to view the barium liquid moving through the esophagus, stomach, and duodenum. Additional x rays and fluoroscopy are performed while the patient lies on an x-ray table. To fully coat the upper GI tract with barium liquid, the technologist or radiologist may press on the abdomen or ask the patient  to change position. Patients hold still in various positions, allowing the technologist or radiologist to take x rays of the upper GI tract at different angles. If a technologist conducts the upper GI series, a radiologist will later examine the images to look for problems.  This test typically takes about 1 hour to  complete. __________________________________________________________________ Thank you for choosing me and [facility].  Pricilla Riffle. Dagoberto Ligas., MD., [facility]

## 2018-02-15 NOTE — Progress Notes (Signed)
History of Present Illness: This is an 84 year old female referred by Jiles Prows, MD for the evaluation of GERD, cough, possible LPR. These symptoms were active since Februrary and have resolved over the past week. She has noted intermittent dysphagia with rice since her fundoplication. She is followed by ENT and was recently treated for sinusitis. Treated with ranitidine and famotidine.  She refuses to take PPIs as she feels they may have caused her GERD.  She relates several years of generalized, mild abdominal pain and abdominal bloating.  She has chronic constipation that is reasonably well controlled on Linzess.  She was previously followed by Dr. Benson Norway and states she underwent colonoscopy in 2014 and she does not recall the results.  Denies weight loss, diarrhea, change in stool caliber, melena, hematochezia, nausea, vomiting, chest pain.     Allergies  Allergen Reactions  . Statins Other (See Comments)    Muscle aches  . Cholestatin   . Rosuvastatin     Other reaction(s): Unknown  . Simvastatin     Other reaction(s): Unknown  . Doxycycline Itching and Rash  . Prednisone Itching    REACTION: itching   Outpatient Medications Prior to Visit  Medication Sig Dispense Refill  . amLODipine (NORVASC) 5 MG tablet     . aspirin EC 81 MG tablet Take 81 mg by mouth daily.    . Cholecalciferol 3000 units TABS Take 3,000 Units by mouth daily.    . cloNIDine (CATAPRES) 0.1 MG tablet Take 0.1 mg by mouth daily.     Marland Kitchen docusate sodium (COLACE) 100 MG capsule Take 100 mg by mouth daily as needed for moderate constipation.     Marland Kitchen ezetimibe (ZETIA) 10 MG tablet     . famotidine (PEPCID) 20 MG tablet Take 20 mg by mouth at bedtime.    . hydrochlorothiazide (HYDRODIURIL) 12.5 MG tablet     . levothyroxine (SYNTHROID, LEVOTHROID) 137 MCG tablet Take 137 mcg by mouth daily before breakfast.    . LINZESS 145 MCG CAPS capsule Take 145 mcg by mouth daily.     Marland Kitchen losartan (COZAAR) 100 MG tablet Take  100 mg by mouth daily.    . nitroGLYCERIN (NITROSTAT) 0.4 MG SL tablet Place 1 tablet (0.4 mg total) under the tongue every 5 (five) minutes x 3 doses as needed for chest pain. 25 tablet 3  . OLANZapine (ZYPREXA) 5 MG tablet Take 1 tablet (5 mg total) by mouth at bedtime. 30 tablet 6  . polyethylene glycol (MIRALAX) packet Take 17 g by mouth 2 (two) times daily. (Patient taking differently: Take 17 g by mouth daily as needed. ) 60 packet 6  . potassium chloride SA (K-DUR,KLOR-CON) 20 MEQ tablet Take 20 mEq by mouth daily.    . Probiotic Product (ALIGN) 4 MG CAPS Take 4 mg by mouth daily.    . ranitidine (ZANTAC) 300 MG tablet Take 1 tablet (300 mg total) by mouth daily. 30 tablet 5  . traZODone (DESYREL) 100 MG tablet Take 2 tablets (200 mg total) by mouth at bedtime. 60 tablet 11  . vitamin C (ASCORBIC ACID) 500 MG tablet Take 500 mg by mouth daily.    Marland Kitchen losartan (COZAAR) 25 MG tablet Take 25 mg by mouth daily.    . Azelastine HCl 0.15 % SOLN Can use one spray in each nostril one to two times daily as needed. 90 mL 0  . benzonatate (TESSALON) 100 MG capsule Take by mouth 3 (three) times daily  as needed for cough.    . Multiple Vitamin (MULTIVITAMIN WITH MINERALS) TABS Take 1 tablet by mouth daily.    Marland Kitchen olmesartan (BENICAR) 40 MG tablet Take 40 mg by mouth daily.     No facility-administered medications prior to visit.    Past Medical History:  Diagnosis Date  . Anemia 06/02/2012  . Anxiety   . Arthritis   . CAD (coronary artery disease)    a. 2005 small inferoapical MI --> medical therapy b. 2007 - nomral coronaries c. 11/2015 mid to distal LAD 20% stenosis, 1st diag 30%, and 50%,--> medical therapy    . Cataracts, bilateral 09/11/2011   Per report of pt.  . Esophageal abnormality 09/11/2011   Surgically treated per pt.  Marland Kitchen GERD (gastroesophageal reflux disease)   . Headache(784.0)   . Hypercholesterolemia 09/11/2011  . Hyperlipemia   . Hypertension   . Hypothyroid 09/11/2011  .  Hypothyroidism   . Insect bite of ankle, left 09/11/2011   back of ankle  . Kidney mass 09/11/2011   Per report of pt  . Leukocytopenia   . Liver mass 09/11/2011   Per report of pt  . Mental health disorder   . Myocardial infarction ([place]) 09/11/2011   Per pt in 2005 (small vessel)  . Osteoporosis 09/11/2011  . Pneumonia   . Thigh cramp 09/11/2011   Left thigh x 3 nights since starting unspecified psychotropic medication   Past Surgical History:  Procedure Laterality Date  . ABDOMINAL HYSTERECTOMY    . APPENDECTOMY    . BREAST BIOPSY    . BREAST EXCISIONAL BIOPSY    . CARDIAC CATHETERIZATION N/A 12/10/2015   Procedure: Left Heart Cath and Coronary Angiography;  Surgeon: Burnell Blanks, MD;  Location: [facility];  Service: Cardiovascular;  Laterality: N/A;  . CARDIOVASCULAR STRESS TEST  08-15-2007   EF 59%  . CHOLECYSTECTOMY OPEN    . GALLBLADDER SURGERY  09/11/2011  . HYSTEROTOMY    . KNEE SURGERY  09/11/2011  . SHOULDER SURGERY  09/11/2011   Right side  . TONSILLECTOMY    . US ECHOCARDIOGRAPHY  11/25/2015   Est EF 55-60%   Social History   Socioeconomic History  . Marital status: Single    Spouse name: Not on file  . Number of children: Not on file  . Years of education: Not on file  . Highest education level: Not on file  Occupational History  . Not on file  Social Needs  . Financial resource strain: Not on file  . Food insecurity:    Worry: Not on file    Inability: Not on file  . Transportation needs:    Medical: Not on file    Non-medical: Not on file  Tobacco Use  . Smoking status: Never Smoker  . Smokeless tobacco: Never Used  Substance and Sexual Activity  . Alcohol use: No  . Drug use: No  . Sexual activity: Not Currently  Lifestyle  . Physical activity:    Days per week: Not on file    Minutes per session: Not on file  . Stress: Not on file  Relationships  . Social connections:    Talks on phone: Not on file    Gets together: Not on  file    Attends religious service: Not on file    Active member of club or organization: Not on file    Attends meetings of clubs or organizations: Not on file    Relationship status: Not on file  Other Topics Concern  . Not on file  Social History Narrative  . Not on file   Family History  Problem Relation Age of Onset  . Hypertension Sister   . Hypertension Brother        Review of Systems: Pertinent positive and negative review of systems were noted in the above HPI section. All other review of systems were otherwise negative.  Current Medications, Allergies, Past Medical History, Past Surgical History, Family History and Social History were reviewed in [employer] record.  Physical Exam: General: Well developed, well nourished, no acute distress Head: Normocephalic and atraumatic Eyes:  sclerae anicteric, EOMI Ears: Normal auditory acuity Mouth: No deformity or lesions Neck: Supple, no masses or thyromegaly Lungs: Clear throughout to auscultation Heart: Regular rate and rhythm; no murmurs, rubs or bruits Abdomen: Soft, mild generalized tenderness and non distended. No masses, hepatosplenomegaly or hernias noted. Normal Bowel sounds Rectal: Not done Musculoskeletal: Symmetrical with no gross deformities  Skin: No lesions on visible extremities Pulses:  Normal pulses noted Extremities: No clubbing, cyanosis, edema or deformities noted Neurological: Alert oriented x 4, grossly nonfocal Cervical Nodes:  No significant cervical adenopathy Inguinal Nodes: No significant inguinal adenopathy Psychological:  Alert and cooperative. Normal mood and affect  Assessment and Recommendations:  1. GERD, cough, possible LPR. Chronic dysphagia with rice since her fundoplication.  These symptoms, except chronic dysphagia, have resolved within the past week. Recommended changing to a PPI and she declined to take a PPI. Continue current regimen of ranitidine 300 qam  and famotidine 20 qpm. Follow all standard antireflux measures. Schedule UGI series to assess fundoplication and evaluate dysphagia. Consider EGD next based on UGI series finding.   2.  Abdominal pain, abdominal bloating. Schedule abdominal/pelvic CT.   3.  Chronic constipation.  Continue Linzess 145 mcg daily, Colace 100 mg daily as needed and MiraLAX once daily as needed.   cc: Jiles Prows, MD [address] [place], [place] [address]

## 2018-02-23 ENCOUNTER — Ambulatory Visit (INDEPENDENT_AMBULATORY_CARE_PROVIDER_SITE_OTHER)
Admission: RE | Admit: 2018-02-23 | Discharge: 2018-02-23 | Disposition: A | Discharge status: Home / Self Care | Payer: Medicare Other | Source: Ambulatory Visit | Attending: Gastroenterology | Admitting: Gastroenterology

## 2018-02-23 DIAGNOSIS — R1084 Generalized abdominal pain: Secondary | ICD-10-CM | POA: Diagnosis not present

## 2018-02-23 DIAGNOSIS — R140 Abdominal distension (gaseous): Secondary | ICD-10-CM

## 2018-02-23 MED ORDER — IOPAMIDOL (ISOVUE-300) INJECTION 61%
100.0000 mL | Freq: Once | INTRAVENOUS | Status: AC | PRN
  Administered 2018-02-23: 100 mL via INTRAVENOUS

## 2018-02-27 ENCOUNTER — Telehealth: Payer: Self-pay | Admitting: *Deleted

## 2018-02-27 NOTE — Telephone Encounter (Signed)
Received records on 02-24-2018 from [facility], including office notes and procedures and pathology report. Forwarded to Dr. Fuller Plan for review.

## 2018-02-28 ENCOUNTER — Other Ambulatory Visit: Payer: Self-pay

## 2018-02-28 DIAGNOSIS — R109 Unspecified abdominal pain: Secondary | ICD-10-CM

## 2018-03-01 ENCOUNTER — Ambulatory Visit (HOSPITAL_COMMUNITY)
Admission: RE | Admit: 2018-03-01 | Discharge: 2018-03-01 | Disposition: A | Discharge status: Home / Self Care | Payer: Medicare Other | Source: Ambulatory Visit | Attending: Gastroenterology | Admitting: Gastroenterology

## 2018-03-01 DIAGNOSIS — R140 Abdominal distension (gaseous): Secondary | ICD-10-CM | POA: Diagnosis not present

## 2018-03-01 DIAGNOSIS — K219 Gastro-esophageal reflux disease without esophagitis: Secondary | ICD-10-CM

## 2018-03-01 DIAGNOSIS — R1084 Generalized abdominal pain: Secondary | ICD-10-CM

## 2018-03-01 DIAGNOSIS — K224 Dyskinesia of esophagus: Secondary | ICD-10-CM | POA: Diagnosis not present

## 2018-03-02 ENCOUNTER — Other Ambulatory Visit: Payer: Self-pay

## 2018-03-02 ENCOUNTER — Telehealth: Payer: Self-pay | Admitting: Gastroenterology

## 2018-03-02 NOTE — Telephone Encounter (Signed)
Spoke to patient and she is requesting that we send an order to Dr. Raul Del office as she has an appointment there on Monday, 03/06/18. Faxed order for LFT's to: [phone].

## 2018-04-17 ENCOUNTER — Ambulatory Visit (INDEPENDENT_AMBULATORY_CARE_PROVIDER_SITE_OTHER): Payer: Self-pay

## 2018-04-17 ENCOUNTER — Encounter (INDEPENDENT_AMBULATORY_CARE_PROVIDER_SITE_OTHER): Payer: Self-pay | Admitting: Orthopaedic Surgery

## 2018-04-17 ENCOUNTER — Ambulatory Visit (INDEPENDENT_AMBULATORY_CARE_PROVIDER_SITE_OTHER): Payer: Medicare Other | Admitting: Orthopaedic Surgery

## 2018-04-17 DIAGNOSIS — M25561 Pain in right knee: Secondary | ICD-10-CM

## 2018-04-17 DIAGNOSIS — M1711 Unilateral primary osteoarthritis, right knee: Secondary | ICD-10-CM | POA: Diagnosis not present

## 2018-04-17 DIAGNOSIS — G8929 Other chronic pain: Secondary | ICD-10-CM

## 2018-04-17 DIAGNOSIS — M25562 Pain in left knee: Secondary | ICD-10-CM | POA: Diagnosis not present

## 2018-04-17 DIAGNOSIS — Z96651 Presence of right artificial knee joint: Secondary | ICD-10-CM

## 2018-04-17 MED ORDER — LIDOCAINE HCL 1 % IJ SOLN
3.0000 mL | INTRAMUSCULAR | Status: AC | PRN
Start: 2018-04-17 — End: 2018-04-17
  Administered 2018-04-17: 3 mL

## 2018-04-17 MED ORDER — METHYLPREDNISOLONE ACETATE 40 MG/ML IJ SUSP
40.0000 mg | INTRAMUSCULAR | Status: AC | PRN
  Administered 2018-04-17: 40 mg via INTRA_ARTICULAR

## 2018-04-17 NOTE — Progress Notes (Signed)
Office Visit Note   Patient: [name]           Date of Birth: 1932-01-11           MRN: [id number] Visit Date: 04/17/2018              Requested by: Marton Redwood, MD [address] [place], [place] [address] PCP: Marton Redwood, MD   Assessment & Plan: Visit Diagnoses:  1. Chronic pain of left knee   2. Chronic pain of right knee   3. Unilateral primary osteoarthritis, right knee   4. History of total right knee replacement     Plan: I did talk to her about trying a steroid injection of her right knee and she agreed to this.  I do feel that she would benefit from a right knee replacement as does she.  I would like her to think about this over the next 4 weeks and even see about having a visit with her primary care physician for clearance if we both determined that surgery would be the next option for her.  We will reevaluate her in 4 weeks.  All question concerns were answered and addressed.  Follow-Up Instructions: Return in about 4 weeks (around 05/15/2018).   Orders:  Orders Placed This Encounter  Procedures  . Large Joint Inj  . XR Knee 1-2 Views Left  . XR Knee 1-2 Views Right   No orders of the defined types were placed in this encounter.     Procedures: Large Joint Inj: R knee on 04/17/2018 8:48 AM Indications: diagnostic evaluation and pain Details: 22 G 1.5 in needle, superolateral approach  Arthrogram: No  Medications: 3 mL lidocaine 1 %; 40 mg methylPREDNISolone acetate 40 MG/ML Outcome: tolerated well, no immediate complications Procedure, treatment alternatives, risks and benefits explained, specific risks discussed. Consent was given by the patient.  Immediately prior to procedure a time out was called to verify the correct patient, procedure, equipment, support staff and site/side marked as required. Patient was prepped and draped in the usual sterile fashion.       Clinical Data: No additional findings.   Subjective: Chief Complaint  Patient presents with  . Left Knee - Pain  . Right Knee - Pain  The patient comes in today for evaluation treatment of bilateral knee pain.  She actually had a left total knee arthroplasty by 1 of our partners 15 years ago.  As of this point she like to see for both knees.  She lives alone and at home.  She is in a senior apartment she states.  She has severe bilateral knee pain but  is really more of her right knee than her left.  Left knee hurts mainly just slightly anterior but the right knee is been bothering the most and it turns in on her.  She reports severe allergies right now.  She sees her primary care physician yearly and does not need to see him until February.  She has been told in the past that she needed a total knee arthroplasty but then she had some health issues that kept her from having a knee replacement on the right side.  She says those have since resolved and she is doing well overall other than her bilateral knee pain.  She says she has a hard time getting around in general.  She states this is due to her right knee being so severely disabled with pain and arthritis.  HPI  Review of Systems She currently denies any headache, chest pain, shortness of breath, fever, chills, nausea, vomiting.  Objective: Vital Signs: There were no vitals taken for this visit.  Physical Exam She is alert and oriented x3 and in no acute distress Ortho Exam Examination of both knees show varus malalignment of her right knee.  There is significant patellofemoral crepitation and severe medial and lateral joint line tenderness and pain throughout her arc of motion.  The knee feels stable ligamentously.   Examination of her left knee shows a more straight knee.  There is no significant grinding and no effusion with a well-healed surgical incision but global pain. Specialty Comments:  No specialty comments available.  Imaging: Xr Knee 1-2 Views Left  Result Date: 04/17/2018 2 views of the left knee show a total knee arthroplasty.  The metal components appear to be in place with no gross evidence of loosening or ostial lysis.  The joint space is narrow likely resulting from poly-ethylene liner wear.  Xr Knee 1-2 Views Right  Result Date: 04/17/2018 2 views of the right knee show severe end-stage arthritis.  There is complete loss of medial joint space.  There are large para-articular osteophytes throughout the knee.  There is significant varus malalignment.    PMFS History: Patient Active Problem List   Diagnosis Date Noted  . Precordial pain   . Chest pain 12/08/2015  . Weight loss 06/07/2014  . Hyponatremia 10/02/2013  . Chronic cough 10/02/2013  . Anxiety disorder 10/02/2013  . Chronic constipation 10/02/2013  . Leukopenia 05/31/2013  . Microcytic anemia 05/31/2013  . Anemia of chronic illness 06/02/2012  . Bruising 01/13/2012  . Muscle ache 01/13/2012  . Hypothyroidism   . CAD (coronary artery disease)   . Leukocytopenia   . Chest pain 04/01/2011  . CONSTIPATION 08/08/2008  . COUGH 07/03/2007  . DEPRESSION 06/26/2007  . HYSTERECTOMY, HX OF 06/26/2007  . Other acquired absence of organ 06/26/2007  . COLON POLYP 09/15/2006  . Hyperlipidemia 09/15/2006  . OBESITY, NOS 09/15/2006  . Essential hypertension, benign 09/15/2006  . CORONARY, ARTERIOSCLEROSIS 09/15/2006  . RHINITIS, ALLERGIC 09/15/2006  . ASTHMA, UNSPECIFIED 09/15/2006  . GASTROESOPHAGEAL REFLUX, NO ESOPHAGITIS 09/15/2006  . DIVERTICULITIS OF COLON, NOS 09/15/2006  . OSTEOARTHRITIS, MULTI SITES 09/15/2006  . INCONTINENCE, URGE 09/15/2006   Past Medical History:  Diagnosis Date  . Anemia 06/02/2012  .  Anxiety   . Arthritis   . CAD (coronary artery disease)    a. 2005 small inferoapical MI --> medical therapy b. 2007 - nomral coronaries c. 11/2015 mid to distal LAD 20% stenosis, 1st diag 30%, and 50%,--> medical therapy    . Cataracts,  bilateral 09/11/2011   Per report of pt.  . Esophageal abnormality 09/11/2011   Surgically treated per pt.  Marland Kitchen GERD (gastroesophageal reflux disease)   . Headache(784.0)   . Hypercholesterolemia 09/11/2011  . Hyperlipemia   . Hypertension   . Hypothyroid 09/11/2011  . Hypothyroidism   . Insect bite of ankle, left 09/11/2011   back of ankle  . Kidney mass 09/11/2011   Per report of pt  . Leukocytopenia   . Liver mass 09/11/2011   Per report of pt  . Mental health disorder   . Myocardial infarction ([place]) 09/11/2011   Per pt in 2005 (small vessel)  . Osteoporosis 09/11/2011  . Pneumonia   . Thigh cramp 09/11/2011   Left thigh x 3 nights since starting unspecified psychotropic medication    Family History  Problem Relation Age of Onset  . Hypertension Sister   . Hypertension Brother     Past Surgical History:  Procedure Laterality Date  . ABDOMINAL HYSTERECTOMY    . APPENDECTOMY    . BREAST BIOPSY    . BREAST EXCISIONAL BIOPSY    . CARDIAC CATHETERIZATION N/A 12/10/2015   Procedure: Left Heart Cath and Coronary Angiography;  Surgeon: Burnell Blanks, MD;  Location: [facility];  Service: Cardiovascular;  Laterality: N/A;  . CARDIOVASCULAR STRESS TEST  08-15-2007   EF 59%  . CHOLECYSTECTOMY OPEN    . GALLBLADDER SURGERY  09/11/2011  . HYSTEROTOMY    . KNEE SURGERY  09/11/2011  . SHOULDER SURGERY  09/11/2011   Right side  . TONSILLECTOMY    . US ECHOCARDIOGRAPHY  11/25/2015   Est EF 55-60%   Social History   Occupational History  . Not on file  Tobacco Use  . Smoking status: Never Smoker  . Smokeless tobacco: Never Used  Substance and Sexual Activity  . Alcohol use: No  . Drug use: No  . Sexual activity: Not Currently

## 2018-05-15 ENCOUNTER — Encounter (INDEPENDENT_AMBULATORY_CARE_PROVIDER_SITE_OTHER): Payer: Self-pay | Admitting: Orthopaedic Surgery

## 2018-05-15 ENCOUNTER — Ambulatory Visit (INDEPENDENT_AMBULATORY_CARE_PROVIDER_SITE_OTHER): Payer: Medicare Other | Admitting: Orthopaedic Surgery

## 2018-05-15 DIAGNOSIS — M25562 Pain in left knee: Secondary | ICD-10-CM

## 2018-05-15 DIAGNOSIS — G8929 Other chronic pain: Secondary | ICD-10-CM

## 2018-05-15 DIAGNOSIS — Z96651 Presence of right artificial knee joint: Secondary | ICD-10-CM | POA: Diagnosis not present

## 2018-05-15 DIAGNOSIS — M25561 Pain in right knee: Secondary | ICD-10-CM | POA: Diagnosis not present

## 2018-05-15 DIAGNOSIS — M1711 Unilateral primary osteoarthritis, right knee: Secondary | ICD-10-CM

## 2018-05-15 NOTE — Progress Notes (Signed)
The patient is well-known to me.  She has severe osteoarthritis in her right knee.  She has a previous left total knee arthroplasty that was done remotely by someone else.  She is 84 years old and does ambulate with a cane.  We did place a steroid injection a month ago and her right knee and this did help somewhat.  She is interested in knee replacement surgery.  She is to see her primary care physician she states later this month.  Already have an appointment with her apparently December 3 and we can go over again the findings of her primary care physician and see if it is appropriate for [place] medically to set up surgery for a right total knee arthroplasty.  Her left knee is been bothering her and I do feel its due to the way she is putting to that knee and her knee replaced on the left side as she compensates for her severe right side arthritis.  We went over x-rays again from the last visit showing her the severity of her arthritis in her right knee.  Range of motion significant limited with flexion contracture and not full extension or full flexion.  The arthritis is severe.  X-rays show significant varus malalignment of the right knee with complete loss of joint space throughout.  She will continue her cane and will see her primary care physician later this month to check on clearance for considering a right total knee arthroplasty.  At her next visit no x-rays are needed.  I did place a one-time steroid injection in her left knee due to pain in that knee.  Again the x-rays of the left side show no evidence of failure of the left knee prosthesis.

## 2018-06-20 ENCOUNTER — Ambulatory Visit (INDEPENDENT_AMBULATORY_CARE_PROVIDER_SITE_OTHER): Payer: Medicare Other | Admitting: Orthopaedic Surgery

## 2018-06-22 ENCOUNTER — Other Ambulatory Visit: Payer: Self-pay | Admitting: Allergy and Immunology

## 2018-06-22 NOTE — Telephone Encounter (Signed)
Courtesy refill  

## 2018-07-03 ENCOUNTER — Ambulatory Visit (INDEPENDENT_AMBULATORY_CARE_PROVIDER_SITE_OTHER): Payer: Medicare Other | Admitting: Orthopaedic Surgery

## 2018-07-03 ENCOUNTER — Encounter (INDEPENDENT_AMBULATORY_CARE_PROVIDER_SITE_OTHER): Payer: Self-pay | Admitting: Orthopaedic Surgery

## 2018-07-03 DIAGNOSIS — M25562 Pain in left knee: Secondary | ICD-10-CM | POA: Diagnosis not present

## 2018-07-03 DIAGNOSIS — M25561 Pain in right knee: Secondary | ICD-10-CM

## 2018-07-03 DIAGNOSIS — G8929 Other chronic pain: Secondary | ICD-10-CM | POA: Diagnosis not present

## 2018-07-03 DIAGNOSIS — M1711 Unilateral primary osteoarthritis, right knee: Secondary | ICD-10-CM | POA: Diagnosis not present

## 2018-07-03 NOTE — Progress Notes (Signed)
The patient is well-known to [place].  She is a 84 year old female with severe debilitating arthritis involving her right knee.  She has a remote history of a left total knee arthroplasty.  She has had steroid injections in the past of his right knee.  At this point she does wish to proceed with a right total knee arthroplasty given the failure of conservative treatment.  She wants to wait till about March 2020.  She has a full physical in February 2020.  Her primary care physician Dr. Dede Query of [facility] as well as her Cardiologist Dr. Einar Gip are both well aware of our recommendation for knee replacement surgery and she said that they have provided clearance for the surgery.  Her pain is daily.  She ambulates with a cane.  She has significant varus malalignment of the right knee on exam with severe patellofemoral crepitation.  X-rays we performed in the recent past show severe end-stage arthritis of her right knee involving all 3 compartments.  At this point we will work on setting her up for a right total knee arthroplasty in March 2020.  All question concerns were answered and addressed.  I feel it is medically necessary at this point given her fall risk and the amount of pain she is having.  Also given the fact she is failed all forms conservative treatment of the severely arthritic right knee.  I do feel that this will help improve her quality of life and mobility significantly.

## 2018-07-17 ENCOUNTER — Other Ambulatory Visit: Payer: Self-pay | Admitting: Internal Medicine

## 2018-07-17 DIAGNOSIS — Z1231 Encounter for screening mammogram for malignant neoplasm of breast: Secondary | ICD-10-CM

## 2018-09-05 ENCOUNTER — Ambulatory Visit
Admission: RE | Admit: 2018-09-05 | Discharge: 2018-09-05 | Disposition: A | Discharge status: Home / Self Care | Payer: Medicare Other | Source: Ambulatory Visit | Attending: Internal Medicine | Admitting: Internal Medicine

## 2018-09-05 DIAGNOSIS — Z1231 Encounter for screening mammogram for malignant neoplasm of breast: Secondary | ICD-10-CM

## 2018-09-12 ENCOUNTER — Telehealth (INDEPENDENT_AMBULATORY_CARE_PROVIDER_SITE_OTHER): Payer: Self-pay | Admitting: Orthopaedic Surgery

## 2018-09-12 NOTE — Telephone Encounter (Signed)
Patient called wanted to know if she needs to proceed with surgery or come into the office. Patient stated she was cleared from her medical doctor and ready to proceed with surgery.

## 2018-09-12 NOTE — Telephone Encounter (Signed)
Looks like it is ok to schedule, can you get clearance and go from there?

## 2018-09-15 NOTE — Telephone Encounter (Signed)
I called patient and scheduled surgery. 

## 2018-09-22 ENCOUNTER — Other Ambulatory Visit: Payer: Self-pay | Admitting: Allergy and Immunology

## 2018-09-27 ENCOUNTER — Other Ambulatory Visit: Payer: Self-pay

## 2018-09-27 ENCOUNTER — Ambulatory Visit (INDEPENDENT_AMBULATORY_CARE_PROVIDER_SITE_OTHER): Payer: Medicare Other | Admitting: Orthopaedic Surgery

## 2018-09-27 ENCOUNTER — Encounter (INDEPENDENT_AMBULATORY_CARE_PROVIDER_SITE_OTHER): Payer: Self-pay | Admitting: Orthopaedic Surgery

## 2018-09-27 DIAGNOSIS — M1711 Unilateral primary osteoarthritis, right knee: Secondary | ICD-10-CM

## 2018-09-27 NOTE — Progress Notes (Signed)
Ms. Glauser is scheduled for a right total knee arthroplasty on 10/13/2018.  Her primary care physician knows about this as well.  She is had previous heart issues in the past but those have been rectified.  She did have a remote history of a left total knee arthroplasty done many years ago.  She did have a DVT after that which ended up with a PE and causing her to have an MI.  That was many years ago.  She now ambulates with a cane.  Have seen her multiple times for her right knee and it does have severe end-stage arthritis.  After medical clearance we did set her up for the surgery for 10/13/2018.  I had a long and thorough discussion with her in the office today about the risk and benefits of the surgery.  She embolus with a cane and she says her pain is daily and this knee is now detrimentally affecting her actives daily living as well as her mobility and her quality of life to the point that she does wish to have the surgery.  I did talk to her about putting her back on a blood thinner which would be stronger than aspirin after the surgery given her history.  Also had a discussion with her about her age and comorbidities and how this can heighten her risks of acute blood loss anemia, nerve and vessel injury, infection, DVT and other perioperative issues that can arise given her frailty.  She still wishes to proceed with the surgery.  Her knee on exam does show significant varus malalignment and severe patellofemoral rotation with swelling and pain.  Her x-rays show severe end-stage arthritis of the right knee.  She does have a chronic cough and a chronic congestion but this is been worked up and at one time they felt was potentially a GI issue.  She has had a Nissen fundoplication remotely.  At this point though she is medically stable and does not have any active issues.  She will keep her preoperative appointment at [facility] long as preoperative for her labs and will keep her on the schedule unless something  arises that causes [place] to not want to proceed with surgery.  All questions concerns were answered and addressed today.

## 2018-09-29 NOTE — Progress Notes (Signed)
NEED ORDERS IN Epic FOR 3-27 SURGERY

## 2018-10-03 ENCOUNTER — Other Ambulatory Visit (INDEPENDENT_AMBULATORY_CARE_PROVIDER_SITE_OTHER): Payer: Self-pay | Admitting: Physician Assistant

## 2018-10-09 ENCOUNTER — Inpatient Hospital Stay (HOSPITAL_COMMUNITY): Admission: RE | Admit: 2018-10-09 | Payer: Medicare Other | Source: Ambulatory Visit

## 2018-10-13 ENCOUNTER — Encounter (HOSPITAL_COMMUNITY): Admission: RE | Payer: Self-pay | Source: Home / Self Care

## 2018-10-13 ENCOUNTER — Ambulatory Visit (HOSPITAL_COMMUNITY): Admission: RE | Admit: 2018-10-13 | Payer: Medicare Other | Source: Home / Self Care | Admitting: Orthopaedic Surgery

## 2018-10-13 SURGERY — ARTHROPLASTY, KNEE, TOTAL
Anesthesia: Spinal | Site: Knee | Laterality: Right

## 2018-10-25 ENCOUNTER — Ambulatory Visit (INDEPENDENT_AMBULATORY_CARE_PROVIDER_SITE_OTHER): Payer: Medicare Other | Admitting: Orthopaedic Surgery

## 2018-11-29 ENCOUNTER — Other Ambulatory Visit: Payer: Self-pay

## 2018-12-05 ENCOUNTER — Other Ambulatory Visit: Payer: Self-pay | Admitting: Cardiology

## 2018-12-05 NOTE — Telephone Encounter (Signed)
Please fill

## 2018-12-20 ENCOUNTER — Other Ambulatory Visit: Payer: Self-pay

## 2019-02-20 ENCOUNTER — Telehealth: Payer: Self-pay

## 2019-02-20 NOTE — Telephone Encounter (Signed)
YOUR CARDIOLOGY TEAM HAS ARRANGED FOR AN E-VISIT FOR YOUR APPOINTMENT - PLEASE REVIEW IMPORTANT INFORMATION BELOW SEVERAL DAYS PRIOR TO YOUR APPOINTMENT  Due to the recent COVID-19 pandemic, we are transitioning in-person office visits to tele-medicine visits in an effort to decrease unnecessary exposure to our patients, their families, and staff. These visits are billed to your insurance just like a normal visit is. We also encourage you to sign up for MyChart if you have not already done so. You will need a smartphone if possible. For patients that do not have this, we can still complete the visit using a regular telephone but do prefer a smartphone to enable video when possible. You may have a family member that lives with you that can help. If possible, we also ask that you have a blood pressure cuff and scale at home to measure your blood pressure, heart rate and weight prior to your scheduled appointment. Patients with clinical needs that need an in-person evaluation and testing will still be able to come to the office if absolutely necessary. If you have any questions, feel free to call our office.     YOUR PROVIDER WILL BE USING THE FOLLOWING PLATFORM TO COMPLETE YOUR VISIT: [email or web address]  . IF USING MYCHART - How to Download the MyChart App to Your SmartPhone   - If Apple, go to App Store and type in MyChart in the search bar and download the app. If Android, ask patient to go to Google Play Store and type in MyChart in the search bar and download the app. The app is free but as with any other app downloads, your phone may require you to verify saved payment information or Apple/Android password.  - You will need to then log into the app with your MyChart username and password, and select [facility] as your healthcare provider to link the account.  - When it is time for your visit, go to the MyChart app, find appointments, and click Begin Video Visit. Be sure to Select Allow for your device to  access the Microphone and Camera for your visit. You will then be connected, and your provider will be with you shortly.  **If you have any issues connecting or need assistance, please contact MyChart service desk [phone] ([phone])**  **If using a computer, in order to ensure the best quality for your visit, you will need to use either of the following Internet Browsers: Google Chrome or Microsoft Edge**  . IF USING DOXIMITY or [email or web address].ME - The staff will give you instructions on receiving your link to join the meeting the day of your visit.      2-3 DAYS BEFORE YOUR APPOINTMENT  You will receive a telephone call from one of our [facility] team members - your caller ID may say "Unknown caller." If this is a video visit, we will walk you through how to get the video launched on your phone. We will remind you check your blood pressure, heart rate and weight prior to your scheduled appointment. If you have an Apple Watch or Kardia, please upload any pertinent ECG strips the day before or morning of your appointment to MyChart. Our staff will also make sure you have reviewed the consent and agree to move forward with your scheduled tele-health visit.     THE DAY OF YOUR APPOINTMENT  Approximately 15 minutes prior to your scheduled appointment, you will receive a telephone call from one of HeartCare team - your caller ID may say "Unknown caller."    Our staff will confirm medications, vital signs for the day and any symptoms you may be experiencing. Please have this information available prior to the time of visit start. It may also be helpful for you to have a pad of paper and pen handy for any instructions given during your visit. They will also walk you through joining the smartphone meeting if this is a video visit.    CONSENT FOR TELE-HEALTH VISIT - PLEASE REVIEW  I hereby voluntarily request, consent and authorize [facility] and its employed or contracted physicians, physician  assistants, nurse practitioners or other licensed health care professionals (the Practitioner), to provide me with telemedicine health care services (the "Services") as deemed necessary by the treating Practitioner. I acknowledge and consent to receive the Services by the Practitioner via telemedicine. I understand that the telemedicine visit will involve communicating with the Practitioner through live audiovisual communication technology and the disclosure of certain medical information by electronic transmission. I acknowledge that I have been given the opportunity to request an in-person assessment or other available alternative prior to the telemedicine visit and am voluntarily participating in the telemedicine visit.  I understand that I have the right to withhold or withdraw my consent to the use of telemedicine in the course of my care at any time, without affecting my right to future care or treatment, and that the Practitioner or I may terminate the telemedicine visit at any time. I understand that I have the right to inspect all information obtained and/or recorded in the course of the telemedicine visit and may receive copies of available information for a reasonable fee.  I understand that some of the potential risks of receiving the Services via telemedicine include:  . Delay or interruption in medical evaluation due to technological equipment failure or disruption; . Information transmitted may not be sufficient (e.g. poor resolution of images) to allow for appropriate medical decision making by the Practitioner; and/or  . In rare instances, security protocols could fail, causing a breach of personal health information.  Furthermore, I acknowledge that it is my responsibility to provide information about my medical history, conditions and care that is complete and accurate to the best of my ability. I acknowledge that Practitioner's advice, recommendations, and/or decision may be based on  factors not within their control, such as incomplete or inaccurate data provided by me or distortions of diagnostic images or specimens that may result from electronic transmissions. I understand that the practice of medicine is not an exact science and that Practitioner makes no warranties or guarantees regarding treatment outcomes. I acknowledge that I will receive a copy of this consent concurrently upon execution via email to the email address I last provided but may also request a printed copy by calling the office of [facility].    I understand that my insurance will be billed for this visit.   I have read or had this consent read to me. . I understand the contents of this consent, which adequately explains the benefits and risks of the Services being provided via telemedicine.  . I have been provided ample opportunity to ask questions regarding this consent and the Services and have had my questions answered to my satisfaction. . I give my informed consent for the services to be provided through the use of telemedicine in my medical care  By participating in this telemedicine visit I agree to the above.  

## 2019-02-21 ENCOUNTER — Telehealth (INDEPENDENT_AMBULATORY_CARE_PROVIDER_SITE_OTHER): Payer: Medicare Other | Admitting: Cardiovascular Disease

## 2019-02-21 ENCOUNTER — Other Ambulatory Visit: Payer: Self-pay

## 2019-02-21 ENCOUNTER — Encounter: Payer: Self-pay | Admitting: Cardiovascular Disease

## 2019-02-21 VITALS — Ht 59.0 in | Wt 140.0 lb

## 2019-02-21 DIAGNOSIS — I10 Essential (primary) hypertension: Secondary | ICD-10-CM | POA: Diagnosis not present

## 2019-02-21 DIAGNOSIS — I2510 Atherosclerotic heart disease of native coronary artery without angina pectoris: Secondary | ICD-10-CM

## 2019-02-21 DIAGNOSIS — Z7189 Other specified counseling: Secondary | ICD-10-CM

## 2019-02-21 NOTE — Progress Notes (Signed)
Virtual Visit via Telephone Note   This visit type was conducted due to national recommendations for restrictions regarding the COVID-19 Pandemic (e.g. social distancing) in an effort to limit this patient's exposure and mitigate transmission in our community.  Due to her co-morbid illnesses, this patient is at least at moderate risk for complications without adequate follow up.  This format is felt to be most appropriate for this patient at this time.  The patient did not have access to video technology/had technical difficulties with video requiring transitioning to audio format only (telephone).  All issues noted in this document were discussed and addressed.  No physical exam could be performed with this format.  Please refer to the patient's chart for her  consent to telehealth for [facility].   Date:  02/21/2019   ID:  [name], [name] Dec 13, 1931, MRN [id number]  Patient Location: Home Provider Location: Home  PCP:  Marton Redwood, MD  Cardiologist:  Moncia Annas  Electrophysiologist:  None    Problem List 1. Minimal coronary artery disease 2. Hyperlipidemia 3. Chronic cough 4. Hypothyroidism 5. Status post Nissen fundoplication for acid reflux 6. Hypertension  Previous Notes.   Zaida is an elderly female with a history of chest pain with minimal coronary artery disease. She has a history of hyperlipidemia chronic cough she status post Nissen fundoplication for acid reflux. She has a history of hypertension.  She's continued to have some episodes of chest discomfort. These are fairly atypical.  Dec. 4, 2013: She has been having upper abdominal / lower chest discomfort pain. The pain is eased with NTG. She does not think the symptoms are due to soft reflux. She stopped her proton pump inhibitor last year. She was concerned about side effects including osteoporosis.  Sept. 12, 2014:  She is doing well from a cardiac standpoint. She continues to have  problems with GERD. She has had a nissan fundiplication - helped the cough but now she had recurrent GERD.   She has been gaining weight. Has some numbness on the lateral aspect of her right legs.   November 14, 2013:  She was recently hospitalized for generalized weakness and coughing. She was found to have hyponatremia. She has been feeling well. She bought some compression hose but they were too long ( ended at the bend of her knee) and she could not wear them. She is going to go back to the supply store to get a shorter pair. She denies any CP or dyspnea.   Oct. 26, 2015:  [name] is doing well.  No CP or dyspnea.   November 12, 2014:   [name] is a 85 y.o. female who presents for follow up of her HTN Has occasional palpitations.  Has had some leg pain  - especially in the morning.  Not worsened with exercise .   Oct. 26, 2016:  Doing well from a cardiac stnandpoint.    November 10, 2015: Complains of being cold No CP or dyspnea.   Oct. 25, 2017:  [name] is seen today for follow up visit  Has a history of minimal coronary artery disease, hypertension and hyperlipidemia.  Had some left sided chest pain this weekend. Took 2 SL NTG - relief of the pain in 1/2 hour. She originally thought it was indigestion,   Tried a pepcid complete  Had a cath in May that revealed only minimal CAD BP is high today  Did not take her BP meds this am.    Evaluation  Performed:  Follow-Up Visit  Chief Complaint:  CAD  Aug. 5, 2020    [name] is a 85 y.o. female with CAD .  She is seen today via telephone visit.  Still having knee problems.   Saw Dr. Ninfa Linden.    Has been very stable from a cardiac standpoint.  Has some GERD pain - better with antiacids  Had an unremarkable heart cath in 2017. Walks around the house and outside - no cp with exercise   Wears a mask when she goes out. On his BP meds.     The patient does not have  symptoms concerning for COVID-19 infection (fever, chills, cough, or new shortness of breath).    Past Medical History:  Diagnosis Date   Anemia 06/02/2012   Anxiety    Arthritis    CAD (coronary artery disease)    a. 2005 small inferoapical MI --> medical therapy b. 2007 - nomral coronaries c. 11/2015 mid to distal LAD 20% stenosis, 1st diag 30%, and 50%,--> medical therapy     Cataracts, bilateral 09/11/2011   Per report of pt.   Esophageal abnormality 09/11/2011   Surgically treated per pt.   GERD (gastroesophageal reflux disease)    Headache(784.0)    Hypercholesterolemia 09/11/2011   Hyperlipemia    Hypertension    Hypothyroid 09/11/2011   Hypothyroidism    Insect bite of ankle, left 09/11/2011   back of ankle   Kidney mass 09/11/2011   Per report of pt   Leukocytopenia    Liver mass 09/11/2011   Per report of pt   Mental health disorder    Myocardial infarction ([place]) 09/11/2011   Per pt in 2005 (small vessel)   Osteoporosis 09/11/2011   Pneumonia    Thigh cramp 09/11/2011   Left thigh x 3 nights since starting unspecified psychotropic medication   Past Surgical History:  Procedure Laterality Date   ABDOMINAL HYSTERECTOMY     APPENDECTOMY     BREAST BIOPSY     BREAST EXCISIONAL BIOPSY     CARDIAC CATHETERIZATION N/A 12/10/2015   Procedure: Left Heart Cath and Coronary Angiography;  Surgeon: Burnell Blanks, MD;  Location: [facility];  Service: Cardiovascular;  Laterality: N/A;   CARDIOVASCULAR STRESS TEST  08-15-2007   EF 59%   CHOLECYSTECTOMY OPEN     GALLBLADDER SURGERY  09/11/2011   HYSTEROTOMY     KNEE SURGERY  09/11/2011   SHOULDER SURGERY  09/11/2011   Right side   TONSILLECTOMY     US ECHOCARDIOGRAPHY  11/25/2015   Est EF 55-60%     Current Meds  Medication Sig   acetaminophen (TYLENOL) 500 MG tablet Take 500 mg by mouth every 6 (six) hours as needed for moderate pain.   amLODipine (NORVASC) 5 MG tablet Take 5  mg by mouth daily.    aspirin EC 81 MG tablet Take 81 mg by mouth daily.   Azelastine HCl 0.15 % SOLN USE 1 SPRAY IN EACH NOSTRIL UP TO TWO TIMES DAILY AS  NEEDED   Cholecalciferol 50 MCG (2000 UT) CAPS Take 2,000 Units by mouth daily.    cloNIDine (CATAPRES) 0.1 MG tablet Take 0.1 mg by mouth daily.    dextromethorphan (DELSYM) 30 MG/5ML liquid Take 30 mg by mouth 2 (two) times daily.    docusate sodium (COLACE) 100 MG capsule Take 100 mg by mouth daily as needed for moderate constipation.    famotidine (PEPCID) 10 MG tablet Take 10 mg by mouth  daily.    hydrochlorothiazide (HYDRODIURIL) 12.5 MG tablet Take 12.5 mg by mouth daily.    levothyroxine (SYNTHROID, LEVOTHROID) 112 MCG tablet Take 112 mcg by mouth daily before breakfast.    LINZESS 145 MCG CAPS capsule Take 145 mcg by mouth daily.    loratadine (CLARITIN) 10 MG tablet Take 10 mg by mouth daily.   Multiple Vitamin (MULTIVITAMIN WITH MINERALS) TABS tablet Take 1 tablet by mouth daily.   nitroGLYCERIN (NITROSTAT) 0.4 MG SL tablet Place 1 tablet (0.4 mg total) under the tongue every 5 (five) minutes x 3 doses as needed for chest pain.   OLANZapine (ZYPREXA) 2.5 MG tablet Take 2.5 mg by mouth at bedtime.   olmesartan (BENICAR) 40 MG tablet Take 40 mg by mouth daily.   polyethylene glycol (MIRALAX) packet Take 17 g by mouth 2 (two) times daily. (Patient taking differently: Take 17 g by mouth daily as needed. )   potassium chloride SA (K-DUR,KLOR-CON) 20 MEQ tablet Take 20 mEq by mouth daily.   Probiotic Product (ALIGN) 4 MG CAPS Take 4 mg by mouth daily.   simvastatin (ZOCOR) 10 MG tablet Take 5 mg by mouth daily.   traZODone (DESYREL) 100 MG tablet Take 2 tablets (200 mg total) by mouth at bedtime. (Patient taking differently: Take 100-200 mg by mouth at bedtime. )     Allergies:   Statins, Cholestatin, Rosuvastatin, Simvastatin, Doxycycline, and Prednisone   Social History   Tobacco Use   Smoking status: Never  Smoker   Smokeless tobacco: Never Used  Substance Use Topics   Alcohol use: No   Drug use: No     Family Hx: The patient's family history includes Hypertension in her brother and sister.  ROS:   Please see the history of present illness.     All other systems reviewed and are negative.   Prior CV studies:   The following studies were reviewed today:    Labs/Other Tests and Data Reviewed:    EKG:  No ECG reviewed.  Recent Labs: No results found for requested labs within last 8760 hours.   Recent Lipid Panel Lab Results  Component Value Date/Time   CHOL 193 05/13/2014 07:57 AM   TRIG 58.0 05/13/2014 07:57 AM   HDL 74.60 05/13/2014 07:57 AM   CHOLHDL 3 05/13/2014 07:57 AM   LDLCALC 107 (H) 05/13/2014 07:57 AM   LDLDIRECT 109.5 03/29/2011 10:07 AM    Wt Readings from Last 3 Encounters:  02/21/19 140 lb (63.5 kg)  02/15/18 152 lb (68.9 kg)  09/18/17 150 lb (68 kg)     Objective:    Vital Signs:  Ht 4\' 11"  (1.499 m)    Wt 140 lb (63.5 kg)    BMI 28.28 kg/m      ASSESSMENT & PLAN:    1. CAD:   Has minimal CAD.  No angina .    COVID-19 Education: The signs and symptoms of COVID-19 were discussed with the patient and how to seek care for testing (follow up with PCP or arrange E-visit).  The importance of social distancing was discussed today.  Time:   Today, I have spent  19 minutes with the patient with telehealth technology discussing the above problems.     Medication Adjustments/Labs and Tests Ordered: Current medicines are reviewed at length with the patient today.  Concerns regarding medicines are outlined above.   Tests Ordered: No orders of the defined types were placed in this encounter.   Medication Changes: No orders of the  defined types were placed in this encounter.   Follow Up:  In Person in 6 month(s)  Signed, Mertie Moores, MD  02/21/2019 8:57 AM    [place]

## 2019-02-21 NOTE — Patient Instructions (Signed)
Medication Instructions:  Your physician recommends that you continue on your current medications as directed. Please refer to the Current Medication list given to you today.  If you need a refill on your cardiac medications before your next appointment, please call your pharmacy.   Lab work: You will have lab work at the time of your follow up appointment or just prior.  You will need to be fasting for these labs (nothing to eat or drink after midnight the night before except water and black coffee).   If you have labs (blood work) drawn today and your tests are completely normal, you will receive your results only by: Marland Kitchen MyChart Message (if you have MyChart) OR . A paper copy in the mail If you have any lab test that is abnormal or we need to change your treatment, we will call you to review the results.  Testing/Procedures: None  Follow-Up: At [facility], you and your health needs are our priority.  As part of our continuing mission to provide you with exceptional heart care, we have created designated Provider Care Teams.  These Care Teams include your primary Cardiologist (physician) and Advanced Practice Providers (APPs -  Physician Assistants and Nurse Practitioners) who all work together to provide you with the care you need, when you need it. You will need a follow up appointment in:  6 months.  Please call our office 2 months in advance to schedule this appointment.  You may see Dr. Acie Fredrickson or one of the following Advanced Practice Providers on your designated Care Team: Richardson Dopp, PA-C [place], [place] . Daune Perch, NP  Any Other Special Instructions Will Be Listed Below (If Applicable).

## 2019-03-03 IMAGING — CT CT ABD-PELV W/ CM
2 of 5 series · 15 of 46 positions shown, 17 images · IV contrast (ISOVUE 300)
Comparison: 06/04/2008

CLINICAL DATA: Abdominal pain, bloating. Chronic constipation.
Prior cholecystectomy, appendectomy, and Nissen fundoplication.

EXAM:
CT ABDOMEN AND PELVIS WITH CONTRAST
TECHNIQUE: Multidetector CT imaging of the abdomen and pelvis was performed
using the standard protocol following bolus administration of
intravenous contrast.
CONTRAST:  100mL 5E4MPQ-3KK IOPAMIDOL (5E4MPQ-3KK) INJECTION 61%

[Series 2: abd/pel w · axial · 0.64mm/px · z∈[-409,-39]mm · 12 of 84 slices shown, 14 images]
[im 5/84  soft-tissue]
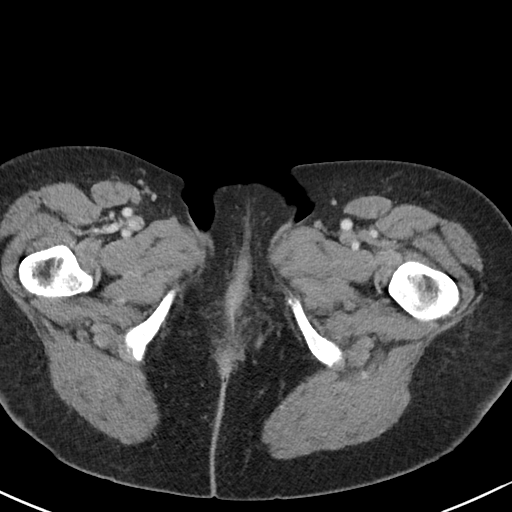
[im 5/84  bone]
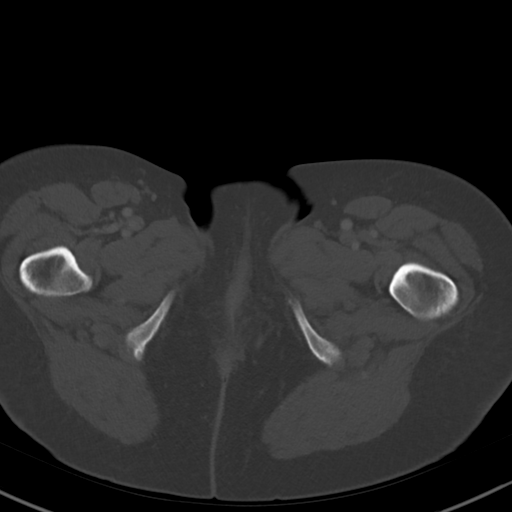
[im 13/84  soft-tissue]
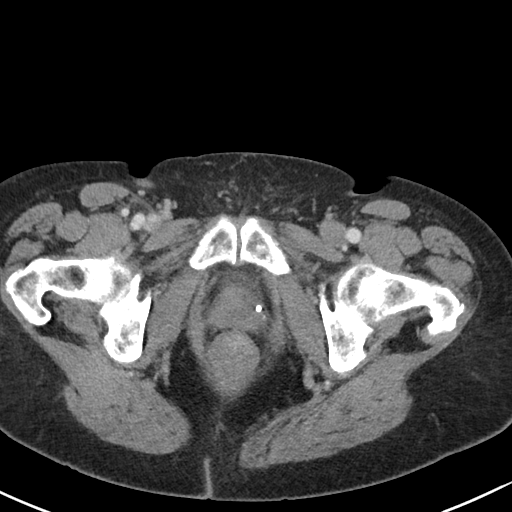
[im 17/84  soft-tissue]
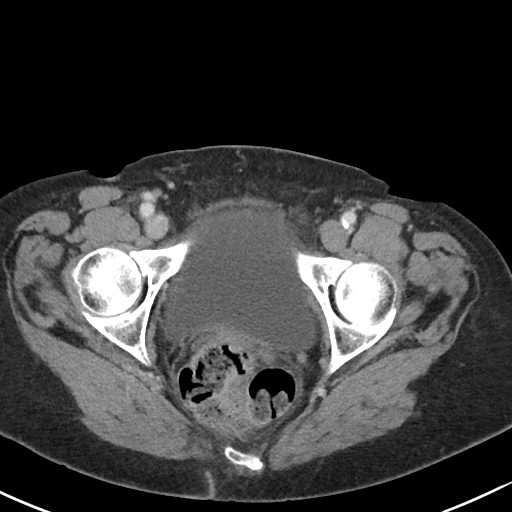
[im 25/84  soft-tissue]
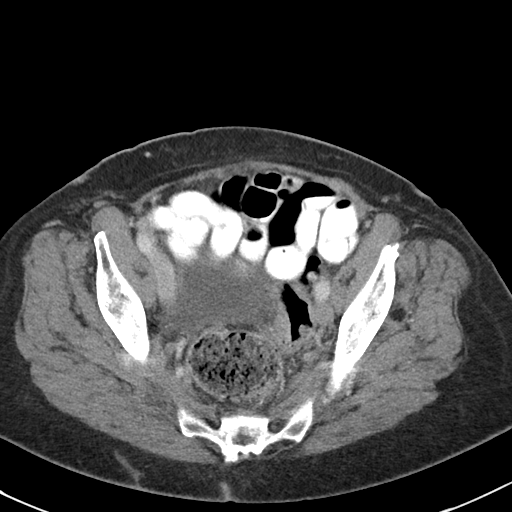
[im 34/84  soft-tissue]
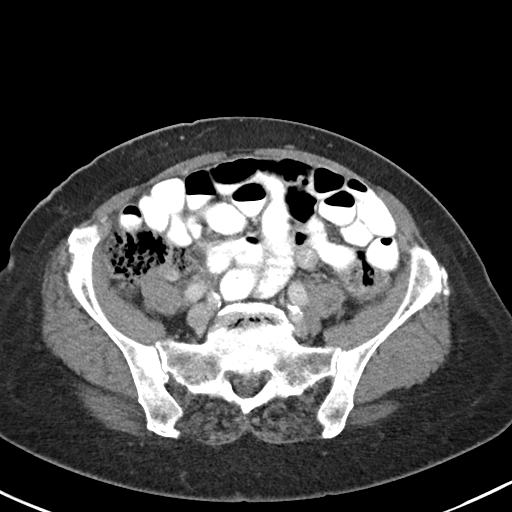
[im 38/84  soft-tissue]
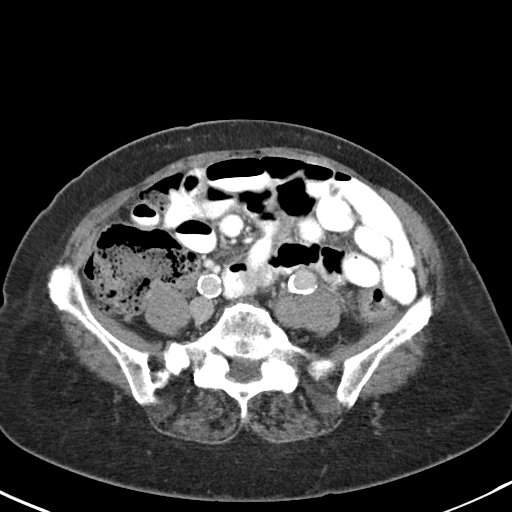
[im 46/84  soft-tissue]
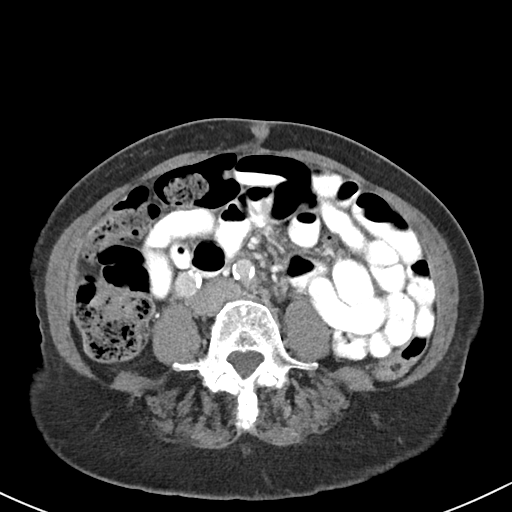
[im 50/84  soft-tissue]
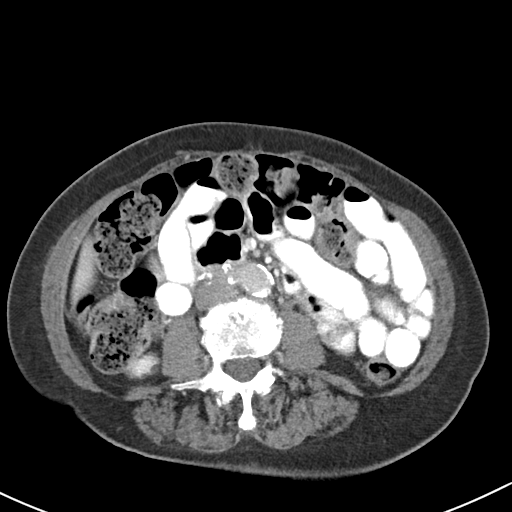
[im 59/84  soft-tissue]
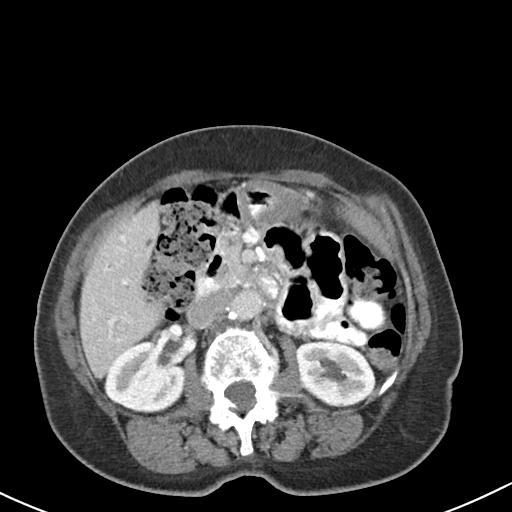
[im 59/84  bone]
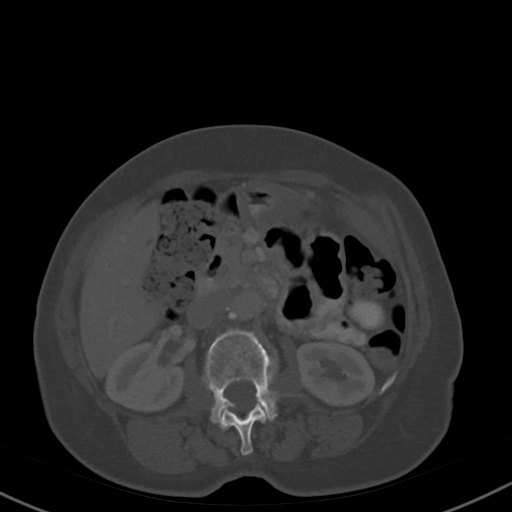
[im 67/84  soft-tissue]
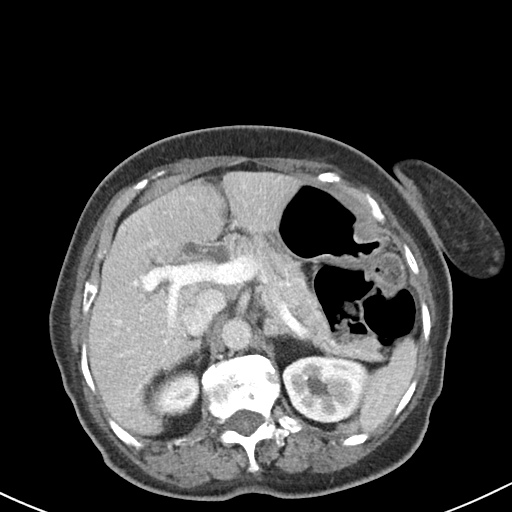
[im 71/84  soft-tissue]
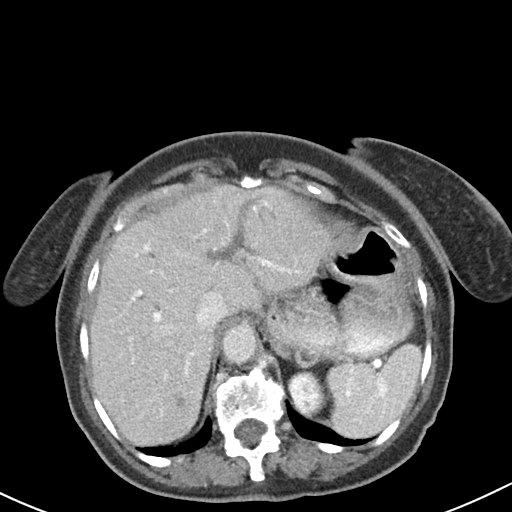
[im 79/84  soft-tissue]
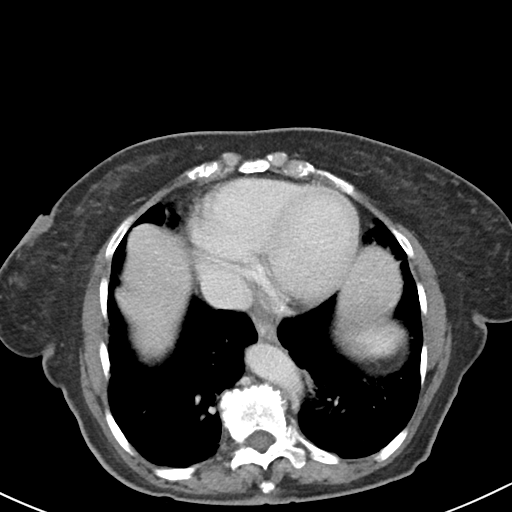

[Series 6: abd/pel w st · coronal · 0.56mm/px · 3 of 79 slices shown]
[im 27/79  soft-tissue]
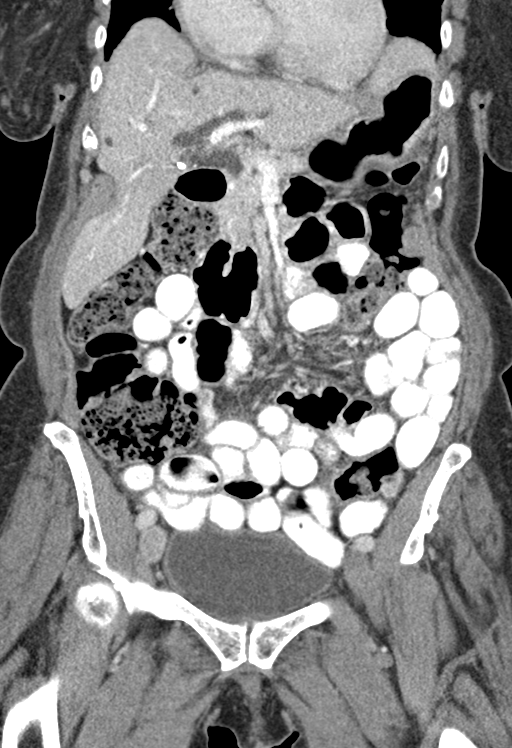
[im 35/79  soft-tissue]
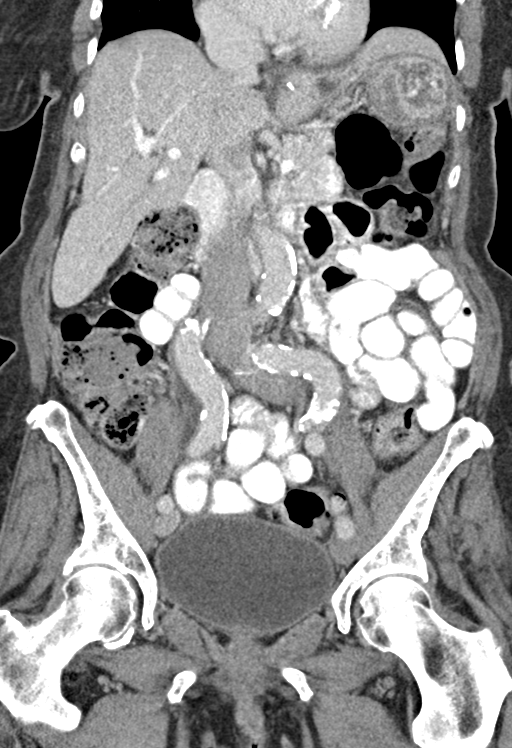
[im 44/79  soft-tissue]
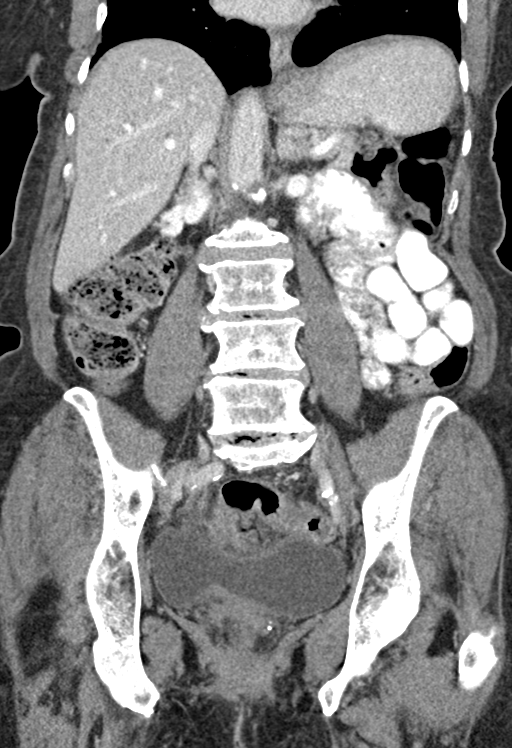

[15 of 46 positions shown; findings below may reference images not displayed]

FINDINGS: Lower chest: Lung bases are clear.

Hepatobiliary: Scattered hepatic cysts, measuring up to 10 mm in
segment 7 (series 2/image 15).

Status post cholecystectomy. Mild intrahepatic and extrahepatic
ductal dilatation. Common duct measures 12 mm and smoothly tapers at
the ampulla, possibly postsurgical.

Pancreas: Within normal limits.

Spleen: Within normal limits.

Adrenals/Urinary Tract: Adrenal glands are within normal limits.

Subcentimeter bilateral renal cysts.  No hydronephrosis.

Bladder is within normal limits.

Stomach/Bowel: Stomach is notable for postsurgical changes related
to prior Nissen fundoplication.

No evidence of bowel obstruction.

Prior appendectomy.

Moderate right colonic stool burden, suggesting constipation. Mild
rectal stool burden.

Vascular/Lymphatic: No evidence of abdominal aortic aneurysm.

Atherosclerotic calcifications of the abdominal aorta and branch
vessels.

No suspicious abdominopelvic lymphadenopathy.

Reproductive: Status post hysterectomy.

No adnexal masses.

Other: No abdominopelvic ascites.

Musculoskeletal: Degenerative changes of the visualized
thoracolumbar spine.
IMPRESSION: Status post cholecystectomy. Mild intrahepatic and extrahepatic
ductal dilatation. Common duct measures 12 mm and smoothly tapers at
the ampulla, possibly postsurgical. Correlate with LFTs.

Moderate right colonic stool burden, suggesting constipation. No
evidence of bowel obstruction. Prior appendectomy.

Additional ancillary findings as above.

## 2019-03-30 ENCOUNTER — Other Ambulatory Visit: Payer: Self-pay

## 2019-04-02 ENCOUNTER — Other Ambulatory Visit: Payer: Self-pay | Admitting: Physician Assistant

## 2019-04-10 ENCOUNTER — Other Ambulatory Visit (HOSPITAL_COMMUNITY)
Admission: RE | Admit: 2019-04-10 | Discharge: 2019-04-10 | Disposition: A | Discharge status: Home / Self Care | Payer: Medicare Other | Source: Ambulatory Visit | Attending: Orthopaedic Surgery | Admitting: Orthopaedic Surgery

## 2019-04-10 DIAGNOSIS — Z01812 Encounter for preprocedural laboratory examination: Secondary | ICD-10-CM | POA: Insufficient documentation

## 2019-04-10 DIAGNOSIS — Z20828 Contact with and (suspected) exposure to other viral communicable diseases: Secondary | ICD-10-CM | POA: Insufficient documentation

## 2019-04-10 NOTE — Patient Instructions (Signed)
DUE TO COVID-19 ONLY ONE VISITOR IS ALLOWED TO COME WITH YOU AND STAY IN THE WAITING ROOM ONLY DURING PRE OP AND PROCEDURE DAY OF SURGERY. THE 1 VISITOR MAY VISIT WITH YOU AFTER SURGERY IN YOUR PRIVATE ROOM DURING VISITING HOURS ONLY!  ONCE YOUR COVID TEST IS COMPLETED, PLEASE BEGIN THE QUARANTINE INSTRUCTIONS AS OUTLINED IN YOUR HANDOUT.                [name]    Your procedure is scheduled on: 04/13/2019   Report to [facility] Main  Entrance   Report to admitting at 8:30 AM     Call this number if you have problems the morning of surgery [phone]    Remember: Do not eat food: After Midnight.    NO SOLID FOOD AFTER MIDNIGHT THE NIGHT PRIOR TO SURGERY. NOTHING BY MOUTH EXCEPT CLEAR LIQUIDS UNTIL 8:00am.    PLEASE FINISH ENSURE DRINK PER SURGEON ORDER  WHICH NEEDS TO BE COMPLETED AT 8:00am.   CLEAR LIQUID DIET   Foods Allowed                                                                     Foods Excluded  Coffee and tea, regular and decaf                             liquids that you cannot  Plain Jell-O any favor except red or purple                                           see through such as: Fruit ices (not with fruit pulp)                                     milk, soups, orange juice  Iced Popsicles                                    All solid food Carbonated beverages, regular and diet                                    Cranberry, grape and apple juices Sports drinks like Gatorade Lightly seasoned clear broth or consume(fat free) Sugar, honey syrup  Sample Menu Breakfast                                Lunch                                     Supper Cranberry juice                    Beef broth  Chicken broth Jell-O                                     Grape juice                           Apple juice Coffee or tea                        Jell-O                                      Popsicle                         Coffee or tea                        Coffee or tea  _____________________________________________________________________               BRUSH YOUR TEETH MORNING OF SURGERY AND RINSE YOUR MOUTH OUT, NO CHEWING GUM CANDY OR MINTS.     Take these medicines the morning of surgery with A SIP OF WATER: Amlodipine, clonidine, pepcid, levothyroxine, and zocor                                 You may not have any metal on your body including hair pins and              piercings  Do not wear jewelry, make-up, lotions, powders or perfumes, deodorant             Do not wear nail polish.  Do not shave  48 hours prior to surgery.                 Do not bring valuables to the hospital. [name].  Contacts, dentures or bridgework may not be worn into surgery.  Leave suitcase in the car. After surgery it may be brought to your room.                  Please read over the following fact sheets you were given: _____________________________________________________________________             [facility] - Preparing for Surgery Before surgery, you can play an important role.  Because skin is not sterile, your skin needs to be as free of germs as possible.  You can reduce the number of germs on your skin by washing with CHG (chlorahexidine gluconate) soap before surgery.  CHG is an antiseptic cleaner which kills germs and bonds with the skin to continue killing germs even after washing. Please DO NOT use if you have an allergy to CHG or antibacterial soaps.  If your skin becomes reddened/irritated stop using the CHG and inform your nurse when you arrive at Short Stay. Do not shave (including legs and underarms) for at least 48 hours prior to the first CHG shower.  You may shave your face/neck. Please follow these instructions carefully:  1.  Shower with CHG Soap the night before surgery and the  morning of  Surgery.  2.  If you  choose to wash your hair, wash your hair first as usual with your  normal  shampoo.  3.  After you shampoo, rinse your hair and body thoroughly to remove the  shampoo.                           4.  Use CHG as you would any other liquid soap.  You can apply chg directly  to the skin and wash                       Gently with a scrungie or clean washcloth.  5.  Apply the CHG Soap to your body ONLY FROM THE NECK DOWN.   Do not use on face/ open                           Wound or open sores. Avoid contact with eyes, ears mouth and genitals (private parts).                       Wash face,  Genitals (private parts) with your normal soap.             6.  Wash thoroughly, paying special attention to the area where your surgery  will be performed.  7.  Thoroughly rinse your body with warm water from the neck down.  8.  DO NOT shower/wash with your normal soap after using and rinsing off  the CHG Soap.                9.  Pat yourself dry with a clean towel.            10.  Wear clean pajamas.            11.  Place clean sheets on your bed the night of your first shower and do not  sleep with pets. Day of Surgery : Do not apply any lotions/deodorants the morning of surgery.  Please wear clean clothes to the hospital/surgery center.  FAILURE TO FOLLOW THESE INSTRUCTIONS MAY RESULT IN THE CANCELLATION OF YOUR SURGERY PATIENT SIGNATURE_________________________________  NURSE SIGNATURE__________________________________  ________________________________________________________________________   [name]  An incentive spirometer is a tool that can help keep your lungs clear and active. This tool measures how well you are filling your lungs with each breath. Taking long deep breaths may help reverse or decrease the chance of developing breathing (pulmonary) problems (especially infection) following:  A long period of time when you are unable to move or be active. BEFORE THE PROCEDURE   If  the spirometer includes an indicator to show your best effort, your nurse or respiratory therapist will set it to a desired goal.  If possible, sit up straight or lean slightly forward. Try not to slouch.  Hold the incentive spirometer in an upright position. INSTRUCTIONS FOR USE  1. Sit on the edge of your bed if possible, or sit up as far as you can in bed or on a chair. 2. Hold the incentive spirometer in an upright position. 3. Breathe out normally. 4. Place the mouthpiece in your mouth and seal your lips tightly around it. 5. Breathe in slowly and as deeply as possible, raising the piston or the ball toward the top of the column. 6. Hold your breath for 3-5 seconds or for as long as possible. Allow  the piston or ball to fall to the bottom of the column. 7. Remove the mouthpiece from your mouth and breathe out normally. 8. Rest for a few seconds and repeat Steps 1 through 7 at least 10 times every 1-2 hours when you are awake. Take your time and take a few normal breaths between deep breaths. 9. The spirometer may include an indicator to show your best effort. Use the indicator as a goal to work toward during each repetition. 10. After each set of 10 deep breaths, practice coughing to be sure your lungs are clear. If you have an incision (the cut made at the time of surgery), support your incision when coughing by placing a pillow or rolled up towels firmly against it. Once you are able to get out of bed, walk around indoors and cough well. You may stop using the incentive spirometer when instructed by your caregiver.  RISKS AND COMPLICATIONS  Take your time so you do not get dizzy or light-headed.  If you are in pain, you may need to take or ask for pain medication before doing incentive spirometry. It is harder to take a deep breath if you are having pain. AFTER USE  Rest and breathe slowly and easily.  It can be helpful to keep track of a log of your progress. Your caregiver can  provide you with a simple table to help with this. If you are using the spirometer at home, follow these instructions: [place] IF:   You are having difficultly using the spirometer.  You have trouble using the spirometer as often as instructed.  Your pain medication is not giving enough relief while using the spirometer.  You develop fever of 100.5 F (38.1 C) or higher. SEEK IMMEDIATE MEDICAL CARE IF:   You cough up bloody sputum that had not been present before.  You develop fever of 102 F (38.9 C) or greater.  You develop worsening pain at or near the incision site. MAKE SURE YOU:   Understand these instructions.  Will watch your condition.  Will get help right away if you are not doing well or get worse. Document Released: 11/15/2006 Document Revised: 09/27/2011 Document Reviewed: 01/16/2007 [facility] Patient Information 2014 [place], [place].   ________________________________________________________________________

## 2019-04-11 ENCOUNTER — Other Ambulatory Visit: Payer: Self-pay

## 2019-04-11 ENCOUNTER — Telehealth: Payer: Self-pay | Admitting: *Deleted

## 2019-04-11 ENCOUNTER — Telehealth: Payer: Self-pay | Admitting: Orthopaedic Surgery

## 2019-04-11 ENCOUNTER — Encounter (HOSPITAL_COMMUNITY)
Admission: RE | Admit: 2019-04-11 | Discharge: 2019-04-11 | Disposition: A | Discharge status: Home / Self Care | Payer: Medicare Other | Source: Ambulatory Visit | Attending: Orthopaedic Surgery | Admitting: Orthopaedic Surgery

## 2019-04-11 ENCOUNTER — Encounter (HOSPITAL_COMMUNITY): Payer: Self-pay

## 2019-04-11 DIAGNOSIS — E039 Hypothyroidism, unspecified: Secondary | ICD-10-CM | POA: Insufficient documentation

## 2019-04-11 DIAGNOSIS — I252 Old myocardial infarction: Secondary | ICD-10-CM | POA: Insufficient documentation

## 2019-04-11 DIAGNOSIS — Z79890 Hormone replacement therapy (postmenopausal): Secondary | ICD-10-CM | POA: Insufficient documentation

## 2019-04-11 DIAGNOSIS — I10 Essential (primary) hypertension: Secondary | ICD-10-CM | POA: Insufficient documentation

## 2019-04-11 DIAGNOSIS — Z7982 Long term (current) use of aspirin: Secondary | ICD-10-CM | POA: Insufficient documentation

## 2019-04-11 DIAGNOSIS — E785 Hyperlipidemia, unspecified: Secondary | ICD-10-CM | POA: Insufficient documentation

## 2019-04-11 DIAGNOSIS — M1711 Unilateral primary osteoarthritis, right knee: Secondary | ICD-10-CM | POA: Insufficient documentation

## 2019-04-11 DIAGNOSIS — Z9049 Acquired absence of other specified parts of digestive tract: Secondary | ICD-10-CM | POA: Insufficient documentation

## 2019-04-11 DIAGNOSIS — K219 Gastro-esophageal reflux disease without esophagitis: Secondary | ICD-10-CM | POA: Insufficient documentation

## 2019-04-11 DIAGNOSIS — E7800 Pure hypercholesterolemia, unspecified: Secondary | ICD-10-CM | POA: Insufficient documentation

## 2019-04-11 DIAGNOSIS — Z01818 Encounter for other preprocedural examination: Secondary | ICD-10-CM | POA: Insufficient documentation

## 2019-04-11 DIAGNOSIS — Z90711 Acquired absence of uterus with remaining cervical stump: Secondary | ICD-10-CM | POA: Insufficient documentation

## 2019-04-11 DIAGNOSIS — H269 Unspecified cataract: Secondary | ICD-10-CM | POA: Insufficient documentation

## 2019-04-11 DIAGNOSIS — I2510 Atherosclerotic heart disease of native coronary artery without angina pectoris: Secondary | ICD-10-CM | POA: Insufficient documentation

## 2019-04-11 DIAGNOSIS — Z79899 Other long term (current) drug therapy: Secondary | ICD-10-CM | POA: Insufficient documentation

## 2019-04-11 HISTORY — DX: Angina pectoris, unspecified: I20.9

## 2019-04-11 LAB — BASIC METABOLIC PANEL
Anion gap: 7 (ref 5–15)
BUN: 14 mg/dL (ref 8–23)
CO2: 30 mmol/L (ref 22–32)
Calcium: 9.5 mg/dL (ref 8.9–10.3)
Chloride: 100 mmol/L (ref 98–111)
Creatinine, Ser: 0.78 mg/dL (ref 0.44–1.00)
GFR calc Af Amer: >60 mL/min (ref >60)
GFR calc non Af Amer: >60 mL/min (ref >60)
Glucose, Bld: 87 mg/dL (ref 70–99)
Potassium: 4 mmol/L (ref 3.5–5.1)
Sodium: 137 mmol/L (ref 135–145)

## 2019-04-11 LAB — SURGICAL PCR SCREEN
MRSA, PCR: NEGATIVE
Staphylococcus aureus: NEGATIVE

## 2019-04-11 LAB — CBC
HCT: 37.4 % (ref 36.0–46.0)
Hemoglobin: 11.5 g/dL — ABNORMAL LOW (ref 12.0–15.0)
MCH: 24 pg — ABNORMAL LOW (ref 26.0–34.0)
MCHC: 30.7 g/dL (ref 30.0–36.0)
MCV: 77.9 fL — ABNORMAL LOW (ref 80.0–100.0)
Platelets: 176 10*3/uL (ref 150–400)
RBC: 4.8 MIL/uL (ref 3.87–5.11)
RDW: 15.8 % — ABNORMAL HIGH (ref 11.5–15.5)
WBC: 2.8 10*3/uL — ABNORMAL LOW (ref 4.0–10.5)
nRBC: 0 % (ref 0.0–0.2)

## 2019-04-11 LAB — NOVEL CORONAVIRUS, NAA (HOSP ORDER, SEND-OUT TO REF LAB; TAT 18-24 HRS): SARS-CoV-2, NAA: NOT DETECTED

## 2019-04-11 NOTE — Telephone Encounter (Signed)
See below

## 2019-04-11 NOTE — Telephone Encounter (Signed)
    [facility] Pre-operative Risk Assessment    Request for surgical clearance:  1. What type of surgery is being performed? RIGHT TOTAL KNEE ARTHROPLASTY    2. When is this surgery scheduled? 04/13/19   3. What type of clearance is required (medical clearance vs. Pharmacy clearance to hold med vs. Both)? MEDICAL  4. Are there any medications that need to be held prior to surgery and how long? ASA    5. Practice name and name of physician performing surgery? [facility]; DR. Jean Rosenthal   6. What is your office phone number [phone]    7.   What is your office fax number [phone]  8.   Anesthesia type (None, local, MAC, general) ? NOT LISTED; LEFT MESSAGE TO CONFIRM ANESTHESIA   Julaine Hua 04/11/2019, 3:44 PM  _________________________________________________________________   (provider comments below)

## 2019-04-11 NOTE — Telephone Encounter (Signed)
Note: Anesthesia is Spinal with adductor canal block

## 2019-04-11 NOTE — Telephone Encounter (Signed)
For any type of joint replacement especially with knee replacement like the patient is going to have we always have a combination of regional anesthesia with a abductor canal block combined with spinal anesthesia and a lot of times local infiltration of the joint with Marcaine.  Anesthesia always tries to provide this and less they need to do general anesthesia for other reasons.

## 2019-04-11 NOTE — Telephone Encounter (Signed)
Pt is at low risk for the note procedure OK to hold ASA for 5-7 days prior to surgery since she is getting spinal anesthesia

## 2019-04-11 NOTE — Telephone Encounter (Signed)
Arbie Cookey from [facility] called. She needs to know what type of Anesthesia will be used for surgery.

## 2019-04-11 NOTE — Progress Notes (Addendum)
PCP - Dr. Marton Redwood Cardiologist - Dr.  Acie Fredrickson televisit 02/21/2019  Chest x-ray - Done at Dr. Raul Del office over 1 year ago EKG - 04/11/2019 epic Stress Test - 11/25/2015 ECHO - 11/25/2015 epic Cardiac Cath - 12/10/2015 epic  Sleep Study - none CPAP - none  Fasting Blood Sugar - NA Checks Blood Sugar _____ times a day  Blood Thinner Instructions: none Aspirin Instructions: 81mg , will speak to Dr. Brigitte Pulse or Nahser about when to stop or not to stop. Last Dose:   Anesthesia review:   Chart given to Iver Nestle PA to review Review CBC results (WBC 2.8)  Patient denies shortness of breath, fever, cough and chest pain at PAT appointment, however, spoke to [employer] PA about pt c/o chest pain last week, week of 04/02/2019.  She took pepcid without relief, then took nitro and laid down and got relief.    Patient verbalized understanding of instructions that were given to them at the PAT appointment. Patient was also instructed that they will need to review over the PAT instructions again at home before surgery.

## 2019-04-11 NOTE — Telephone Encounter (Addendum)
   Primary Cardiologist: Mertie Moores, MD  Chart reviewed as part of pre-operative protocol coverage. Patient was contacted 04/11/2019 in reference to pre-operative risk assessment for pending surgery as outlined below.  [name] was last seen on 02/21/2019 by Dr. Acie Fredrickson.  Since that day, [name] has done well. She has chronic intermittent chest pain despite mild disease noted on previous cath in 11/2015, however frequency is quite rare. There has been no change in the frequency of chest pain recently.  Therefore, based on ACC/AHA guidelines, the patient would be at acceptable risk for the planned procedure without further cardiovascular testing.   I will route this recommendation to the requesting party via Epic fax function and remove from pre-op pool.  Please call with questions.   [place], [place] 04/11/2019, 4:34 PM   Addendum: Since her anesthesia is spinal - recommend holding ASA 5-7 days prior to surgery.   Tami Lin Duke, PA-C 04/12/2019, 11:24 AM

## 2019-04-12 ENCOUNTER — Telehealth: Payer: Self-pay | Admitting: Orthopaedic Surgery

## 2019-04-12 NOTE — Anesthesia Preprocedure Evaluation (Addendum)
Anesthesia Evaluation  Patient identified by MRN, date of birth, ID band Patient awake    Reviewed: Allergy & Precautions, NPO status , Patient's Chart, lab work & pertinent test results  History of Anesthesia Complications Negative for: history of anesthetic complications  Airway Mallampati: II  TM Distance: >3 FB Neck ROM: Full    Dental  (+) Edentulous Upper, Missing, Poor Dentition   Pulmonary neg pulmonary ROS,    Pulmonary exam normal        Cardiovascular hypertension, Pt. on medications + CAD (mild nonobstructive per cath, medically managed) and + Past MI (2013)  Normal cardiovascular exam     Neuro/Psych PSYCHIATRIC DISORDERS Anxiety Depression negative neurological ROS     GI/Hepatic Neg liver ROS, GERD  Medicated,  Endo/Other  Hypothyroidism   Renal/GU negative Renal ROS  negative genitourinary   Musculoskeletal  (+) Arthritis , Osteoarthritis,    Abdominal   Peds  Hematology  (+) anemia ,   Anesthesia Other Findings Mild non-obstructive CAD on Cardiac Cath 12/10/2015.  Low risk nuclear study 06/30/2012.  2017 Echo: EF 55-60%, grade 1 dd, mild MR, mild TR, mild LAE  Per cardiologist, Dr. Mertie Moores, "Pt is at low risk for the note procedure OK to hold ASA for 5-7 days prior to surgery since she is getting spinal anesthesia"  Reproductive/Obstetrics                           Anesthesia Physical Anesthesia Plan  ASA: III  Anesthesia Plan: Spinal   Post-op Pain Management:  Regional for Post-op pain   Induction:   PONV Risk Score and Plan: 2 and Propofol infusion, Treatment may vary due to age or medical condition, Ondansetron and TIVA  Airway Management Planned: Nasal Cannula and Simple Face Mask  Additional Equipment: None  Intra-op Plan:   Post-operative Plan:   Informed Consent: I have reviewed the patients History and Physical, chart, labs and discussed the  procedure including the risks, benefits and alternatives for the proposed anesthesia with the patient or authorized representative who has indicated his/her understanding and acceptance.       Plan Discussed with:   Anesthesia Plan Comments: (See PAT note 04/11/2019, Konrad Felix, PA-C)      Anesthesia Quick Evaluation

## 2019-04-12 NOTE — Telephone Encounter (Signed)
This preop request was received yesterday afternoon, which included a request to hold ASA. Given spinal anesthesia, Dr. Acie Fredrickson OK'ed holding ASA for 5-7 days. The procedure is tomorrow and the patient has not held ASA. Is there a contraindication for ASA and spinal with adductor canal block?

## 2019-04-12 NOTE — Progress Notes (Signed)
Anesthesia Chart Review   Case: [id number] Date/Time: 04/13/19 1045   Procedure: RIGHT TOTAL KNEE ARTHROPLASTY (Right Knee)   Anesthesia type: Spinal   Pre-op diagnosis: osteoarthritis right knee   [facility] ROOM 10 / [facility]   Surgeon: Mcarthur Rossetti, MD      DISCUSSION:85 y.o. never smoker with h/o HTN, hypothyroidism, GERD, CAD (MI 2013), right knee OA scheduled for above procedure 04/13/2019 with Dr. Jean Rosenthal.   Mild non-obstructive CAD on Cardiac Cath 12/10/2015.   Low risk nuclear study 06/30/2012.   Per cardiologist, Dr. Mertie Moores, "Pt is at low risk for the note procedure OK to hold ASA for 5-7 days prior to surgery since she is getting spinal anesthesia"  Anticipate pt can proceed with planned procedure barring acute status change.   VS: BP 140/70   Pulse 71   Temp 36.9 C (Oral)   Resp 18   Ht 5' 0.5" (1.537 m)   Wt 68 kg   SpO2 99%   BMI 28.81 kg/m   PROVIDERS: Marton Redwood, MD is PCP   Franki Cabot, MD is Cardiologist  LABS: Labs reviewed: Acceptable for surgery. (all labs ordered are listed, but only abnormal results are displayed)  Labs Reviewed  CBC - Abnormal; Notable for the following components:      Result Value   WBC 2.8 (*)    Hemoglobin 11.5 (*)    MCV 77.9 (*)    MCH 24.0 (*)    RDW 15.8 (*)    All other components within normal limits  SURGICAL PCR SCREEN  BASIC METABOLIC PANEL     IMAGES:   EKG: 04/11/2019 Rate 61 bpm Normal sinus rhythm  No significant change since last tracing  CV: Cardiac Cath 12/10/2015  Mid LAD to Dist LAD lesion, 20% stenosed.  1st Diag-1 lesion, 30% stenosed.  1st Diag-2 lesion, 50% stenosed.   1. Mild non-obstructive CAD  Recommendations: Continue medical management of CAD.   Echo 11/25/2015 Study Conclusions  - Left ventricle: The cavity size was normal. There was mild focal   basal hypertrophy of the septum. Systolic function was normal.   The estimated ejection  fraction was in the range of 55% to 60%.   Wall motion was normal; there were no regional wall motion   abnormalities. Doppler parameters are consistent with abnormal   left ventricular relaxation (grade 1 diastolic dysfunction). - Mitral valve: Calcified annulus. There was mild regurgitation. - Left atrium: The atrium was mildly dilated.  Impressions:  - Normal LV function; grade 1 diastolic dysfunction; mild LAE; mild   MR and TR. Past Medical History:  Diagnosis Date  . Anemia 06/02/2012  . Anginal pain ([place])    complained of chest pain last week lasting momentarily week of 04/02/2019 took reflux medication, then took a nitro and laid down and got relief  . Anxiety   . Arthritis   . CAD (coronary artery disease)    a. 2005 small inferoapical MI --> medical therapy b. 2007 - nomral coronaries c. 11/2015 mid to distal LAD 20% stenosis, 1st diag 30%, and 50%,--> medical therapy    . Cataracts, bilateral 09/11/2011   Per report of pt.  . Esophageal abnormality 09/11/2011   Surgically treated per pt.  Marland Kitchen GERD (gastroesophageal reflux disease)   . Headache(784.0)   . Hypercholesterolemia 09/11/2011  . Hyperlipemia   . Hypertension   . Hypothyroid 09/11/2011  . Hypothyroidism   . Insect bite of ankle, left 09/11/2011   back of  ankle  . Kidney mass 09/11/2011   Per report of pt  . Leukocytopenia   . Liver mass 09/11/2011   Per report of pt  . Mental health disorder   . Myocardial infarction ([place]) 09/11/2011   Per pt in 2005 (small vessel)  . Osteoporosis 09/11/2011  . Pneumonia   . Thigh cramp 09/11/2011   Left thigh x 3 nights since starting unspecified psychotropic medication    Past Surgical History:  Procedure Laterality Date  . ABDOMINAL HYSTERECTOMY    . APPENDECTOMY    . BREAST BIOPSY     biopsy on R breast (benign)  . BREAST EXCISIONAL BIOPSY    . CARDIAC CATHETERIZATION N/A 12/10/2015   Procedure: Left Heart Cath and Coronary Angiography;  Surgeon: Burnell Blanks, MD;  Location: [facility];  Service: Cardiovascular;  Laterality: N/A;  . CARDIOVASCULAR STRESS TEST  08-15-2007   EF 59%  . CHOLECYSTECTOMY OPEN    . GALLBLADDER SURGERY  09/11/2011  . HYSTEROTOMY    . KNEE SURGERY  09/11/2011  . SHOULDER SURGERY  09/11/2011   Right side  . TONSILLECTOMY    . US ECHOCARDIOGRAPHY  11/25/2015   Est EF 55-60%    MEDICATIONS: . acetaminophen (TYLENOL) 500 MG tablet  . amLODipine (NORVASC) 5 MG tablet  . aspirin EC 81 MG tablet  . Azelastine HCl 0.15 % SOLN  . Cholecalciferol 250 MCG (10000 UT) TABS  . cloNIDine (CATAPRES) 0.1 MG tablet  . docusate sodium (COLACE) 100 MG capsule  . famotidine (PEPCID) 10 MG tablet  . hydrochlorothiazide (HYDRODIURIL) 12.5 MG tablet  . levothyroxine (SYNTHROID, LEVOTHROID) 112 MCG tablet  . LINZESS 145 MCG CAPS capsule  . loratadine (CLARITIN) 10 MG tablet  . Multiple Vitamin (MULTIVITAMIN WITH MINERALS) TABS tablet  . nitroGLYCERIN (NITROSTAT) 0.4 MG SL tablet  . OLANZapine (ZYPREXA) 2.5 MG tablet  . olmesartan (BENICAR) 40 MG tablet  . polyethylene glycol (MIRALAX) packet  . potassium chloride SA (K-DUR,KLOR-CON) 20 MEQ tablet  . Probiotic Product (ALIGN) 4 MG CAPS  . simvastatin (ZOCOR) 10 MG tablet  . traZODone (DESYREL) 100 MG tablet  . traZODone (DESYREL) 150 MG tablet  . vitamin C (ASCORBIC ACID) 500 MG tablet   No current facility-administered medications for this encounter.    Maia Plan [facility] Pre-Surgical Testing [phone] 04/12/19 10:16 AM

## 2019-04-12 NOTE — Telephone Encounter (Signed)
Patient's son Grayland Ormond called. He has some questions about the surgery. Would like someone to call him. His call back number is [phone]

## 2019-04-12 NOTE — Telephone Encounter (Signed)
Pt states she has been taking her ASA. She stated she has not taken this morning. Informed pt I will let MD and PA know and call her back. Surgery scheduled for tomorrow, 9/25.

## 2019-04-12 NOTE — Telephone Encounter (Signed)
Per Sherrie she took care of this

## 2019-04-13 ENCOUNTER — Ambulatory Visit (HOSPITAL_COMMUNITY): Payer: Medicare Other | Admitting: Anesthesiology

## 2019-04-13 ENCOUNTER — Observation Stay (HOSPITAL_COMMUNITY): Payer: Medicare Other

## 2019-04-13 ENCOUNTER — Inpatient Hospital Stay (HOSPITAL_COMMUNITY)
Admission: AD | Admit: 2019-04-13 | Discharge: 2019-04-19 | DRG: 470 | Disposition: A | Discharge status: Skilled Nursing Facility | Payer: Medicare Other | Attending: Orthopaedic Surgery | Admitting: Orthopaedic Surgery

## 2019-04-13 ENCOUNTER — Encounter (HOSPITAL_COMMUNITY): Payer: Self-pay | Admitting: Anesthesiology

## 2019-04-13 ENCOUNTER — Ambulatory Visit (HOSPITAL_COMMUNITY): Payer: Medicare Other | Admitting: Physician Assistant

## 2019-04-13 ENCOUNTER — Other Ambulatory Visit: Payer: Self-pay

## 2019-04-13 ENCOUNTER — Encounter (HOSPITAL_COMMUNITY)
Admission: AD | Disposition: A | Discharge status: Home / Self Care | Payer: Self-pay | Source: Home / Self Care | Attending: Orthopaedic Surgery

## 2019-04-13 DIAGNOSIS — K219 Gastro-esophageal reflux disease without esophagitis: Secondary | ICD-10-CM | POA: Diagnosis present

## 2019-04-13 DIAGNOSIS — E785 Hyperlipidemia, unspecified: Secondary | ICD-10-CM | POA: Diagnosis present

## 2019-04-13 DIAGNOSIS — Z20828 Contact with and (suspected) exposure to other viral communicable diseases: Secondary | ICD-10-CM | POA: Diagnosis present

## 2019-04-13 DIAGNOSIS — R339 Retention of urine, unspecified: Secondary | ICD-10-CM | POA: Diagnosis not present

## 2019-04-13 DIAGNOSIS — Z96652 Presence of left artificial knee joint: Secondary | ICD-10-CM | POA: Diagnosis present

## 2019-04-13 DIAGNOSIS — M1711 Unilateral primary osteoarthritis, right knee: Principal | ICD-10-CM | POA: Diagnosis present

## 2019-04-13 DIAGNOSIS — I252 Old myocardial infarction: Secondary | ICD-10-CM

## 2019-04-13 DIAGNOSIS — E039 Hypothyroidism, unspecified: Secondary | ICD-10-CM | POA: Diagnosis present

## 2019-04-13 DIAGNOSIS — Z23 Encounter for immunization: Secondary | ICD-10-CM

## 2019-04-13 DIAGNOSIS — D62 Acute posthemorrhagic anemia: Secondary | ICD-10-CM | POA: Diagnosis not present

## 2019-04-13 DIAGNOSIS — Z79890 Hormone replacement therapy (postmenopausal): Secondary | ICD-10-CM

## 2019-04-13 DIAGNOSIS — K5900 Constipation, unspecified: Secondary | ICD-10-CM | POA: Diagnosis not present

## 2019-04-13 DIAGNOSIS — Z7982 Long term (current) use of aspirin: Secondary | ICD-10-CM

## 2019-04-13 DIAGNOSIS — Z96651 Presence of right artificial knee joint: Secondary | ICD-10-CM

## 2019-04-13 DIAGNOSIS — Z79899 Other long term (current) drug therapy: Secondary | ICD-10-CM

## 2019-04-13 DIAGNOSIS — I10 Essential (primary) hypertension: Secondary | ICD-10-CM | POA: Diagnosis present

## 2019-04-13 DIAGNOSIS — I2510 Atherosclerotic heart disease of native coronary artery without angina pectoris: Secondary | ICD-10-CM | POA: Diagnosis present

## 2019-04-13 DIAGNOSIS — E7800 Pure hypercholesterolemia, unspecified: Secondary | ICD-10-CM | POA: Diagnosis present

## 2019-04-13 DIAGNOSIS — F419 Anxiety disorder, unspecified: Secondary | ICD-10-CM | POA: Diagnosis present

## 2019-04-13 HISTORY — PX: TOTAL KNEE ARTHROPLASTY: SHX125

## 2019-04-13 SURGERY — ARTHROPLASTY, KNEE, TOTAL
Anesthesia: Spinal | Site: Knee | Laterality: Right

## 2019-04-13 MED ORDER — ONDANSETRON HCL 4 MG/2ML IJ SOLN: 4.0000 mg | Freq: Once | INTRAMUSCULAR | Status: DC | PRN

## 2019-04-13 MED ORDER — LACTATED RINGERS IV SOLN
INTRAVENOUS | Status: DC | PRN
  Administered 2019-04-13 (×2): via INTRAVENOUS

## 2019-04-13 MED ORDER — PHENOL 1.4 % MT LIQD
1.0000 | OROMUCOSAL | Status: DC | PRN
  Filled 2019-04-13: qty 177

## 2019-04-13 MED ORDER — MENTHOL 3 MG MT LOZG: 1.0000 | LOZENGE | OROMUCOSAL | Status: DC | PRN

## 2019-04-13 MED ORDER — INFLUENZA VAC A&B SA ADJ QUAD 0.5 ML IM PRSY
0.5000 mL | PREFILLED_SYRINGE | INTRAMUSCULAR | Status: DC
  Filled 2019-04-13: qty 0.5

## 2019-04-13 MED ORDER — AMLODIPINE BESYLATE 5 MG PO TABS
5.0000 mg | ORAL_TABLET | Freq: Every day | ORAL | Status: DC
  Administered 2019-04-14 – 2019-04-19 (×6): 5 mg via ORAL
  Filled 2019-04-13 (×6): qty 1

## 2019-04-13 MED ORDER — FENTANYL CITRATE (PF) 100 MCG/2ML IJ SOLN
INTRAMUSCULAR | Status: AC
  Filled 2019-04-13: qty 2

## 2019-04-13 MED ORDER — ONDANSETRON HCL 4 MG PO TABS: 4.0000 mg | ORAL_TABLET | Freq: Four times a day (QID) | ORAL | Status: DC | PRN

## 2019-04-13 MED ORDER — POLYETHYLENE GLYCOL 3350 17 G PO PACK
17.0000 g | PACK | Freq: Every day | ORAL | Status: DC | PRN
  Administered 2019-04-15 – 2019-04-19 (×4): 17 g via ORAL
  Filled 2019-04-13 (×4): qty 1

## 2019-04-13 MED ORDER — SIMVASTATIN 5 MG PO TABS
5.0000 mg | ORAL_TABLET | Freq: Every day | ORAL | Status: DC
  Administered 2019-04-14 – 2019-04-19 (×6): 5 mg via ORAL
  Filled 2019-04-13 (×6): qty 1

## 2019-04-13 MED ORDER — FENTANYL CITRATE (PF) 100 MCG/2ML IJ SOLN
INTRAMUSCULAR | Status: AC
  Administered 2019-04-13: 11:00:00 50 ug
  Filled 2019-04-13: qty 2

## 2019-04-13 MED ORDER — ALUM & MAG HYDROXIDE-SIMETH 200-200-20 MG/5ML PO SUSP: 30.0000 mL | ORAL | Status: DC | PRN

## 2019-04-13 MED ORDER — OXYCODONE HCL 5 MG/5ML PO SOLN: 5.0000 mg | Freq: Once | ORAL | Status: DC | PRN

## 2019-04-13 MED ORDER — CLONIDINE HCL 0.1 MG PO TABS
0.1000 mg | ORAL_TABLET | Freq: Every day | ORAL | Status: DC
  Administered 2019-04-14 – 2019-04-19 (×6): 0.1 mg via ORAL
  Filled 2019-04-13 (×6): qty 1

## 2019-04-13 MED ORDER — PROPOFOL 10 MG/ML IV BOLUS
INTRAVENOUS | Status: AC
  Filled 2019-04-13: qty 20

## 2019-04-13 MED ORDER — POTASSIUM CHLORIDE CRYS ER 20 MEQ PO TBCR
20.0000 meq | EXTENDED_RELEASE_TABLET | Freq: Every day | ORAL | Status: DC
  Administered 2019-04-14 – 2019-04-19 (×6): 20 meq via ORAL
  Filled 2019-04-13 (×6): qty 1

## 2019-04-13 MED ORDER — METHOCARBAMOL 500 MG IVPB - SIMPLE MED
500.0000 mg | Freq: Four times a day (QID) | INTRAVENOUS | Status: DC | PRN
  Administered 2019-04-13: 500 mg via INTRAVENOUS
  Filled 2019-04-13: qty 50

## 2019-04-13 MED ORDER — PROPOFOL 10 MG/ML IV BOLUS
INTRAVENOUS | Status: DC | PRN
  Administered 2019-04-13: 20 mg via INTRAVENOUS
  Administered 2019-04-13: 10 mg via INTRAVENOUS

## 2019-04-13 MED ORDER — HYDROCHLOROTHIAZIDE 25 MG PO TABS
12.5000 mg | ORAL_TABLET | Freq: Every day | ORAL | Status: DC
  Administered 2019-04-14 – 2019-04-19 (×6): 12.5 mg via ORAL
  Filled 2019-04-13 (×6): qty 1

## 2019-04-13 MED ORDER — HYDROCODONE-ACETAMINOPHEN 5-325 MG PO TABS
1.0000 | ORAL_TABLET | ORAL | Status: DC | PRN
  Administered 2019-04-13: 2 via ORAL
  Administered 2019-04-13: 1 via ORAL
  Administered 2019-04-14: 2 via ORAL
  Administered 2019-04-14 – 2019-04-16 (×2): 1 via ORAL
  Filled 2019-04-13 (×3): qty 2
  Filled 2019-04-13 (×2): qty 1

## 2019-04-13 MED ORDER — ALBUTEROL SULFATE HFA 108 (90 BASE) MCG/ACT IN AERS
INHALATION_SPRAY | RESPIRATORY_TRACT | Status: DC | PRN
  Administered 2019-04-13: 6 via RESPIRATORY_TRACT

## 2019-04-13 MED ORDER — ASPIRIN 81 MG PO CHEW
81.0000 mg | CHEWABLE_TABLET | Freq: Two times a day (BID) | ORAL | Status: DC
  Administered 2019-04-13 – 2019-04-19 (×12): 81 mg via ORAL
  Filled 2019-04-13 (×12): qty 1

## 2019-04-13 MED ORDER — OLANZAPINE 2.5 MG PO TABS
2.5000 mg | ORAL_TABLET | Freq: Every day | ORAL | Status: DC
  Administered 2019-04-13 – 2019-04-18 (×6): 2.5 mg via ORAL
  Filled 2019-04-13 (×7): qty 1

## 2019-04-13 MED ORDER — GLYCOPYRROLATE PF 0.2 MG/ML IJ SOSY
PREFILLED_SYRINGE | INTRAMUSCULAR | Status: DC | PRN
  Administered 2019-04-13: .2 mg via INTRAVENOUS

## 2019-04-13 MED ORDER — PROPOFOL 500 MG/50ML IV EMUL
INTRAVENOUS | Status: DC | PRN
  Administered 2019-04-13: 75 ug/kg/min via INTRAVENOUS

## 2019-04-13 MED ORDER — ACETAMINOPHEN 325 MG PO TABS
325.0000 mg | ORAL_TABLET | Freq: Four times a day (QID) | ORAL | Status: DC | PRN
  Administered 2019-04-15 – 2019-04-17 (×3): 650 mg via ORAL
  Administered 2019-04-18: 325 mg via ORAL
  Administered 2019-04-18 – 2019-04-19 (×2): 650 mg via ORAL
  Filled 2019-04-13 (×5): qty 2
  Filled 2019-04-13: qty 1
  Filled 2019-04-13 (×2): qty 2

## 2019-04-13 MED ORDER — DOCUSATE SODIUM 100 MG PO CAPS
100.0000 mg | ORAL_CAPSULE | Freq: Two times a day (BID) | ORAL | Status: DC
  Administered 2019-04-13 – 2019-04-19 (×12): 100 mg via ORAL
  Filled 2019-04-13 (×11): qty 1

## 2019-04-13 MED ORDER — LORATADINE 10 MG PO TABS
10.0000 mg | ORAL_TABLET | Freq: Every day | ORAL | Status: DC
  Administered 2019-04-14 – 2019-04-19 (×6): 10 mg via ORAL
  Filled 2019-04-13 (×6): qty 1

## 2019-04-13 MED ORDER — CEFAZOLIN SODIUM-DEXTROSE 2-4 GM/100ML-% IV SOLN
2.0000 g | INTRAVENOUS | Status: AC
  Administered 2019-04-13: 2 g via INTRAVENOUS
  Filled 2019-04-13: qty 100

## 2019-04-13 MED ORDER — METHOCARBAMOL 500 MG IVPB - SIMPLE MED
INTRAVENOUS | Status: AC
  Filled 2019-04-13: qty 50

## 2019-04-13 MED ORDER — ADULT MULTIVITAMIN W/MINERALS CH
1.0000 | ORAL_TABLET | Freq: Every day | ORAL | Status: DC
  Administered 2019-04-14 – 2019-04-19 (×6): 1 via ORAL
  Filled 2019-04-13 (×6): qty 1

## 2019-04-13 MED ORDER — ONDANSETRON HCL 4 MG/2ML IJ SOLN
INTRAMUSCULAR | Status: DC | PRN
  Administered 2019-04-13: 4 mg via INTRAVENOUS

## 2019-04-13 MED ORDER — METOCLOPRAMIDE HCL 5 MG/ML IJ SOLN: 5.0000 mg | Freq: Three times a day (TID) | INTRAMUSCULAR | Status: DC | PRN

## 2019-04-13 MED ORDER — IRBESARTAN 150 MG PO TABS
300.0000 mg | ORAL_TABLET | Freq: Every day | ORAL | Status: DC
  Administered 2019-04-14 – 2019-04-19 (×6): 300 mg via ORAL
  Filled 2019-04-13 (×6): qty 2

## 2019-04-13 MED ORDER — EPHEDRINE SULFATE-NACL 50-0.9 MG/10ML-% IV SOSY
PREFILLED_SYRINGE | INTRAVENOUS | Status: DC | PRN
  Administered 2019-04-13: 5 mg via INTRAVENOUS

## 2019-04-13 MED ORDER — STERILE WATER FOR IRRIGATION IR SOLN
Status: DC | PRN
  Administered 2019-04-13: 2000 mL

## 2019-04-13 MED ORDER — CHLORHEXIDINE GLUCONATE 4 % EX LIQD: 60.0000 mL | Freq: Once | CUTANEOUS | Status: DC

## 2019-04-13 MED ORDER — METHOCARBAMOL 500 MG PO TABS
500.0000 mg | ORAL_TABLET | Freq: Four times a day (QID) | ORAL | Status: DC | PRN
  Administered 2019-04-14 – 2019-04-19 (×7): 500 mg via ORAL
  Filled 2019-04-13 (×8): qty 1

## 2019-04-13 MED ORDER — LEVOTHYROXINE SODIUM 112 MCG PO TABS
112.0000 ug | ORAL_TABLET | Freq: Every day | ORAL | Status: DC
  Administered 2019-04-14 – 2019-04-19 (×6): 112 ug via ORAL
  Filled 2019-04-13 (×6): qty 1

## 2019-04-13 MED ORDER — VITAMIN C 500 MG PO TABS
500.0000 mg | ORAL_TABLET | Freq: Every day | ORAL | Status: DC
  Administered 2019-04-14 – 2019-04-19 (×6): 500 mg via ORAL
  Filled 2019-04-13 (×6): qty 1

## 2019-04-13 MED ORDER — ONDANSETRON HCL 4 MG/2ML IJ SOLN: 4.0000 mg | Freq: Four times a day (QID) | INTRAMUSCULAR | Status: DC | PRN

## 2019-04-13 MED ORDER — DIPHENHYDRAMINE HCL 12.5 MG/5ML PO ELIX
12.5000 mg | ORAL_SOLUTION | ORAL | Status: DC | PRN
  Administered 2019-04-15: 12.5 mg via ORAL
  Filled 2019-04-13: qty 5

## 2019-04-13 MED ORDER — POVIDONE-IODINE 10 % EX SWAB: 2.0000 "application " | Freq: Once | CUTANEOUS | Status: DC

## 2019-04-13 MED ORDER — VITAMIN D 25 MCG (1000 UNIT) PO TABS
1000.0000 [IU] | ORAL_TABLET | Freq: Every day | ORAL | Status: DC
  Administered 2019-04-13 – 2019-04-19 (×7): 1000 [IU] via ORAL
  Filled 2019-04-13 (×7): qty 1

## 2019-04-13 MED ORDER — MORPHINE SULFATE (PF) 2 MG/ML IV SOLN
0.5000 mg | INTRAVENOUS | Status: DC | PRN
  Administered 2019-04-14: 1 mg via INTRAVENOUS
  Filled 2019-04-13: qty 1

## 2019-04-13 MED ORDER — LINACLOTIDE 145 MCG PO CAPS
145.0000 ug | ORAL_CAPSULE | Freq: Every day | ORAL | Status: DC
  Administered 2019-04-14 – 2019-04-19 (×6): 145 ug via ORAL
  Filled 2019-04-13 (×6): qty 1

## 2019-04-13 MED ORDER — TRANEXAMIC ACID-NACL 1000-0.7 MG/100ML-% IV SOLN
1000.0000 mg | INTRAVENOUS | Status: AC
  Administered 2019-04-13: 1000 mg via INTRAVENOUS
  Filled 2019-04-13: qty 100

## 2019-04-13 MED ORDER — METOCLOPRAMIDE HCL 5 MG PO TABS: 5.0000 mg | ORAL_TABLET | Freq: Three times a day (TID) | ORAL | Status: DC | PRN

## 2019-04-13 MED ORDER — SODIUM CHLORIDE 0.9 % IR SOLN
Status: DC | PRN
  Administered 2019-04-13: 1000 mL

## 2019-04-13 MED ORDER — CEFAZOLIN SODIUM-DEXTROSE 1-4 GM/50ML-% IV SOLN
1.0000 g | Freq: Four times a day (QID) | INTRAVENOUS | Status: AC
  Administered 2019-04-13 (×2): 1 g via INTRAVENOUS
  Filled 2019-04-13 (×2): qty 50

## 2019-04-13 MED ORDER — OXYCODONE HCL 5 MG PO TABS: 5.0000 mg | ORAL_TABLET | Freq: Once | ORAL | Status: DC | PRN

## 2019-04-13 MED ORDER — FENTANYL CITRATE (PF) 100 MCG/2ML IJ SOLN
25.0000 ug | INTRAMUSCULAR | Status: DC | PRN
  Administered 2019-04-13 (×3): 50 ug via INTRAVENOUS

## 2019-04-13 MED ORDER — SODIUM CHLORIDE 0.9 % IV SOLN
INTRAVENOUS | Status: DC
  Administered 2019-04-13: 16:00:00 75 mL/h via INTRAVENOUS

## 2019-04-13 MED ORDER — TRAMADOL HCL 50 MG PO TABS
50.0000 mg | ORAL_TABLET | Freq: Four times a day (QID) | ORAL | Status: DC
  Administered 2019-04-13 – 2019-04-15 (×7): 50 mg via ORAL
  Filled 2019-04-13 (×7): qty 1

## 2019-04-13 MED ORDER — ROPIVACAINE HCL 5 MG/ML IJ SOLN
INTRAMUSCULAR | Status: DC | PRN
  Administered 2019-04-13: 30 mL via PERINEURAL

## 2019-04-13 MED ORDER — PANTOPRAZOLE SODIUM 40 MG PO TBEC
40.0000 mg | DELAYED_RELEASE_TABLET | Freq: Every day | ORAL | Status: DC
  Administered 2019-04-13 – 2019-04-19 (×7): 40 mg via ORAL
  Filled 2019-04-13 (×7): qty 1

## 2019-04-13 MED ORDER — HYDROCODONE-ACETAMINOPHEN 7.5-325 MG PO TABS
1.0000 | ORAL_TABLET | ORAL | Status: DC | PRN
  Administered 2019-04-14: 2 via ORAL
  Administered 2019-04-18: 22:00:00 1 via ORAL
  Filled 2019-04-13: qty 2
  Filled 2019-04-13: qty 1

## 2019-04-13 MED ORDER — 0.9 % SODIUM CHLORIDE (POUR BTL) OPTIME
TOPICAL | Status: DC | PRN
  Administered 2019-04-13: 1000 mL

## 2019-04-13 SURGICAL SUPPLY — 56 items
APL SKNCLS STERI-STRIP NONHPOA (GAUZE/BANDAGES/DRESSINGS)
BAG SPEC THK2 15X12 ZIP CLS (MISCELLANEOUS)
BAG ZIPLOCK 12X15 (MISCELLANEOUS) IMPLANT
BASEPLATE TIBIAL PRIMARY SZ4 (Plate) ×1 IMPLANT
BENZOIN TINCTURE PRP APPL 2/3 (GAUZE/BANDAGES/DRESSINGS) IMPLANT
BLADE SAG 18X100X1.27 (BLADE) ×1 IMPLANT
BLADE SURG SZ10 CARB STEEL (BLADE) ×4 IMPLANT
BNDG CMPR MED 10X6 ELC LF (GAUZE/BANDAGES/DRESSINGS) ×2
BNDG ELASTIC 6X10 VLCR STRL LF (GAUZE/BANDAGES/DRESSINGS) ×2 IMPLANT
BNDG ELASTIC 6X5.8 VLCR STR LF (GAUZE/BANDAGES/DRESSINGS) ×2 IMPLANT
BOWL SMART MIX CTS (DISPOSABLE) ×1 IMPLANT
BSPLAT TIB 4 CMNT PRM STRL KN (Plate) ×1 IMPLANT
CEMENT BONE SIMPLEX SPEEDSET (Cement) ×2 IMPLANT
COVER SURGICAL LIGHT HANDLE (MISCELLANEOUS) ×2 IMPLANT
COVER WAND RF STERILE (DRAPES) ×1 IMPLANT
CUFF TOURN SGL QUICK 34 (TOURNIQUET CUFF) ×2
CUFF TRNQT CYL 34X4.125X (TOURNIQUET CUFF) ×1 IMPLANT
DECANTER SPIKE VIAL GLASS SM (MISCELLANEOUS) IMPLANT
DRAPE U-SHAPE 47X51 STRL (DRAPES) ×2 IMPLANT
DRSG PAD ABDOMINAL 8X10 ST (GAUZE/BANDAGES/DRESSINGS) ×4 IMPLANT
DURAPREP 26ML APPLICATOR (WOUND CARE) ×2 IMPLANT
ELECT REM PT RETURN 15FT ADLT (MISCELLANEOUS) ×2 IMPLANT
FEMORAL PEG DISTAL FIXATION (Orthopedic Implant) ×1 IMPLANT
FEMORAL TRIATH POST STAB SZ 4 (Orthopedic Implant) ×1 IMPLANT
GAUZE SPONGE 4X4 12PLY STRL (GAUZE/BANDAGES/DRESSINGS) ×2 IMPLANT
GAUZE XEROFORM 1X8 LF (GAUZE/BANDAGES/DRESSINGS) ×1 IMPLANT
GLOVE BIO SURGEON STRL SZ7.5 (GLOVE) ×2 IMPLANT
GLOVE BIOGEL PI IND STRL 8 (GLOVE) ×2 IMPLANT
GLOVE BIOGEL PI INDICATOR 8 (GLOVE) ×2
GLOVE ECLIPSE 8.0 STRL XLNG CF (GLOVE) ×2 IMPLANT
GOWN STRL REUS W/TWL XL LVL3 (GOWN DISPOSABLE) ×4 IMPLANT
HANDPIECE INTERPULSE COAX TIP (DISPOSABLE) ×2
HOLDER FOLEY CATH W/STRAP (MISCELLANEOUS) ×1 IMPLANT
IMMOBILIZER KNEE 20 (SOFTGOODS) ×2
IMMOBILIZER KNEE 20 THIGH 36 (SOFTGOODS) ×1 IMPLANT
INSERT TIBIAL TRIATHLON PS X3 (Insert) ×1 IMPLANT
KIT TURNOVER KIT A (KITS) IMPLANT
NS IRRIG 1000ML POUR BTL (IV SOLUTION) ×2 IMPLANT
PACK TOTAL KNEE CUSTOM (KITS) ×2 IMPLANT
PADDING CAST COTTON 6X4 STRL (CAST SUPPLIES) ×4 IMPLANT
PATELLA A 35MMX10MM (Knees) ×1 IMPLANT
PROTECTOR NERVE ULNAR (MISCELLANEOUS) ×2 IMPLANT
SET HNDPC FAN SPRY TIP SCT (DISPOSABLE) ×1 IMPLANT
SET PAD KNEE POSITIONER (MISCELLANEOUS) ×2 IMPLANT
STAPLER VISISTAT 35W (STAPLE) ×1 IMPLANT
STRIP CLOSURE SKIN 1/2X4 (GAUZE/BANDAGES/DRESSINGS) IMPLANT
SUT MNCRL AB 4-0 PS2 18 (SUTURE) IMPLANT
SUT VIC AB 0 CT1 27 (SUTURE) ×2
SUT VIC AB 0 CT1 27XBRD ANTBC (SUTURE) ×1 IMPLANT
SUT VIC AB 1 CT1 36 (SUTURE) ×4 IMPLANT
SUT VIC AB 2-0 CT1 27 (SUTURE) ×4
SUT VIC AB 2-0 CT1 TAPERPNT 27 (SUTURE) ×2 IMPLANT
TRAY FOLEY MTR SLVR 14FR STAT (SET/KITS/TRAYS/PACK) ×1 IMPLANT
WATER STERILE IRR 1000ML POUR (IV SOLUTION) ×2 IMPLANT
WRAP KNEE MAXI GEL POST OP (GAUZE/BANDAGES/DRESSINGS) ×2 IMPLANT
YANKAUER SUCT BULB TIP 10FT TU (MISCELLANEOUS) ×2 IMPLANT

## 2019-04-13 NOTE — Brief Op Note (Signed)
04/13/2019  12:33 PM  PATIENT:  [name]  85 y.o. female  PRE-OPERATIVE DIAGNOSIS:  osteoarthritis right knee  POST-OPERATIVE DIAGNOSIS:  osteoarthritis right knee  PROCEDURE:  Procedure(s): RIGHT TOTAL KNEE ARTHROPLASTY (Right)  SURGEON:  Surgeon(s) and Role:    Mcarthur Rossetti, MD - Primary  PHYSICIAN ASSISTANT: Benita Stabile, PA-C  ANESTHESIA:   regional and spinal  EBL:  150 mL   COUNTS:  YES  TOURNIQUET:   Total Tourniquet Time Documented: Thigh (Right) - 49 minutes Total: Thigh (Right) - 49 minutes   DICTATION: .Other Dictation: Dictation Number [id number]  PLAN OF CARE: Admit to inpatient   PATIENT DISPOSITION:  PACU - hemodynamically stable.   Delay start of Pharmacological VTE agent (>24hrs) due to surgical blood loss or risk of bleeding: no

## 2019-04-13 NOTE — Transfer of Care (Signed)
Immediate Anesthesia Transfer of Care Note  Patient: [name]  Procedure(s) Performed: Procedure(s): RIGHT TOTAL KNEE ARTHROPLASTY (Right)  Patient Location: PACU  Anesthesia Type:Spinal  Level of Consciousness:  sedated, patient cooperative and responds to stimulation  Airway & Oxygen Therapy:Patient Spontanous Breathing and Patient connected to face mask oxgen  Post-op Assessment:  Report given to PACU RN and Post -op Vital signs reviewed and stable  Post vital signs:  Reviewed and stable  Last Vitals:  Vitals:   04/13/19 1039 04/13/19 1040  BP:    Pulse: 60 (!) 51  Resp: 19 18  Temp:    SpO2: [id number] (!) [id number]    Complications: No apparent anesthesia complications

## 2019-04-13 NOTE — Progress Notes (Signed)
AssistedDr. Chelsey Woodrum with right, ultrasound guided, adductor canal block. Side rails up, monitors on throughout procedure. See vital signs in flow sheet. Tolerated Procedure well.  

## 2019-04-13 NOTE — Evaluation (Signed)
Physical Therapy Evaluation Patient Details Name: [name] MRN: [id number] DOB: 02-27-1932 Today's Date: 04/13/2019   History of Present Illness  Patient is 85 y.o. female s.p Rt TKA on 04/13/19 with PMH significant for HTN, HLD, GERD, CAD, anxiety, osteoporosis, and history of MI with cardiac cath in 2013.    Clinical Impression  [name] is a 85 y.o. female POD 0 s/p Rt TKA. Patient reports modified independence with SPC for mobility at baseline. She lives alone and does not have any family that can stay with her 24/7 to assist her at this time. Patient is now limited by functional impairments (see PT problem list below) and requires min assist for transfers and gait with RW. Patient was able to ambulate ~40 feet with RW and min assist and repeated cues for safe use of RW and posture. Patient instructed in exercise to facilitate ROM and circulation to manage edema. She is currently unsafe to dsicharge home as she lives alone and does not have 24/7 assistance available. Patient will benefit from continued skilled PT interventions to address impairments and progress towards PLOF at SNF vs home with HHPT pending progress. Acute PT will follow to progress mobility as able.     Follow Up Recommendations Follow surgeon's recommendation for DC plan and follow-up therapies;SNF(pt lives alone and does not have any family available to assist her, at this time she is unsafe to go home and would require 24/7 supervision, may go home if supervision available and pending progress)    Equipment Recommendations  Rolling walker with 5" wheels    Recommendations for Other Services       Precautions / Restrictions Precautions Precautions: Fall Restrictions Weight Bearing Restrictions: No      Mobility  Bed Mobility Overal bed mobility: Needs Assistance Bed Mobility: Supine to Sit     Supine to sit: Min assist     General bed mobility comments: assist for Rt LE  mobility, cues to sequence and sit up at EOB  Transfers Overall transfer level: Needs assistance Equipment used: Rolling walker (2 wheeled) Transfers: Sit to/from Stand Sit to Stand: Min assist         General transfer comment: verbal/tactile cues required for safe use of RW and assist to initiate power up to stand  Ambulation/Gait Ambulation/Gait assistance: Min assist Gait Distance (Feet): 40 Feet Assistive device: Rolling walker (2 wheeled) Gait Pattern/deviations: Step-to pattern;Decreased step length - left;Decreased step length - right;Decreased stance time - right;Decreased stride length;Trunk flexed;Antalgic Gait velocity: decreased   General Gait Details: pt required repeated cues throughout for posture, cues for safe hand placment and to maintain close and safe proxmiety to walker  Stairs            Wheelchair Mobility    Modified Rankin (Stroke Patients Only)       Balance Overall balance assessment: Needs assistance Sitting-balance support: Feet supported;No upper extremity supported Sitting balance-Leahy Scale: Good     Standing balance support: During functional activity;Bilateral upper extremity supported Standing balance-Leahy Scale: Poor Standing balance comment: pt requires support for all standing                Pertinent Vitals/Pain Pain Assessment: 0-10 Pain Score: 7  Pain Location: Rt Knee Pain Descriptors / Indicators: Aching;Sore;Dull Pain Intervention(s): Limited activity within patient's tolerance;Monitored during session;Repositioned    Home Living Family/patient expects to be discharged to:: Private residence Living Arrangements: Alone Available Help at Discharge: Family;Available PRN/intermittently(mostly in evenings) Type of Home: Apartment(pt  lives at senior apartments) Home Access: Level entry     Home Layout: One [place]ney: Tub bench;Cane - single point;Grab bars - tub/shower      Prior Function Level  of Independence: Independent with assistive device(s)         Comments: pt using SPC prior to surgery     Hand Dominance   Dominant Hand: Right    Extremity/Trunk Assessment   Upper Extremity Assessment Upper Extremity Assessment: Generalized weakness    Lower Extremity Assessment Lower Extremity Assessment: Generalized weakness;RLE deficits/detail RLE: Unable to fully assess due to pain       Communication   Communication: No difficulties  Cognition Arousal/Alertness: Awake/alert Behavior During Therapy: WFL for tasks assessed/performed Overall Cognitive Status: Within Functional Limits for tasks assessed        General Comments      Exercises Total Joint Exercises Ankle Circles/Pumps: AROM;Both;Seated;10 reps Quad Sets: AROM;Right;5 reps;Seated;Supine(2 sets (1x5 supine, 1x5 seated))   Assessment/Plan    PT Assessment Patient needs continued PT services  PT Problem List Decreased strength;Decreased activity tolerance;Decreased mobility;Decreased range of motion;Decreased balance;Pain;Decreased knowledge of use of DME;Decreased safety awareness       PT Treatment Interventions DME instruction;Stair training;Therapeutic activities;Balance training;Modalities;Manual techniques;Patient/family education;Therapeutic exercise;Functional mobility training;Gait training    PT Goals (Current goals can be found in the Care Plan section)  Acute Rehab PT Goals Patient Stated Goal: pt wants to go home rather than SNF, wants to become independent with mobility again PT Goal Formulation: With patient Time For Goal Achievement: 04/20/19 Potential to Achieve Goals: Good    Frequency 7X/week   Barriers to discharge   pt lives alone and does not have any assistance available during the day from her family as they all work       AM-PAC PT "6 Clicks" Mobility  Outcome Measure Help needed turning from your back to your side while in a flat bed without using bedrails?: A  Little Help needed moving from lying on your back to sitting on the side of a flat bed without using bedrails?: A Little Help needed moving to and from a bed to a chair (including a wheelchair)?: A Little Help needed standing up from a chair using your arms (e.g., wheelchair or bedside chair)?: A Little Help needed to walk in hospital room?: A Little Help needed climbing 3-5 steps with a railing? : A Lot 6 Click Score: 17    End of Session Equipment Utilized During Treatment: Gait belt Activity Tolerance: Patient tolerated treatment well Patient left: in chair;with call bell/phone within reach;with chair alarm set;with family/visitor present Nurse Communication: Mobility status PT Visit Diagnosis: Difficulty in walking, not elsewhere classified (R26.2);Muscle weakness (generalized) (M62.81);Other abnormalities of gait and mobility (R26.89);Unsteadiness on feet (R26.81);Pain Pain - Right/Left: Right Pain - part of body: Knee    Time: [id number] PT Time Calculation (min) (ACUTE ONLY): 29 min   Charges:   PT Evaluation $PT Eval Low Complexity: 1 Low PT Treatments $Gait Training: 8-22 mins       Kipp Brood, PT, DPT, [facility] Physical Therapist with [facility]  04/13/2019 5:52 PM

## 2019-04-13 NOTE — Anesthesia Procedure Notes (Signed)
Date/Time: 04/13/2019 11:05 AM Performed by: Glory Buff, CRNA Oxygen Delivery Method: Simple face mask

## 2019-04-13 NOTE — Anesthesia Procedure Notes (Signed)
Anesthesia Regional Block: Adductor canal block   Pre-Anesthetic Checklist: ,, timeout performed, Correct Patient, Correct Site, Correct Laterality, Correct Procedure, Correct Position, site marked, Risks and benefits discussed,  Surgical consent,  Pre-op evaluation,  At surgeon's request and post-op pain management  Laterality: Right  Prep: chloraprep       Needles:  Injection technique: Single-shot  Needle Type: Echogenic Stimulator Needle     Needle Length: 10cm  Needle Gauge: 21     Additional Needles:   Procedures:,,,, ultrasound used (permanent image in chart),,,,  Narrative:  Start time: 04/13/2019 10:35 AM End time: 04/13/2019 10:41 AM Injection made incrementally with aspirations every 5 mL.  Performed by: Personally  Anesthesiologist: Lidia Collum, MD  Additional Notes: Monitors applied. Injection made in 5cc increments. No resistance to injection. Good needle visualization. Patient tolerated procedure well.

## 2019-04-13 NOTE — Telephone Encounter (Signed)
I spoke with Dr. Barbaraann Faster who is the anesthesiologist helping with the procedure. From her perspective, she is willing to do the procedure on aspirin. Patient is not on other antiplatelet or anticoagulation

## 2019-04-13 NOTE — Anesthesia Postprocedure Evaluation (Signed)
Anesthesia Post Note  Patient: [name]  Procedure(s) Performed: RIGHT TOTAL KNEE ARTHROPLASTY (Right Knee)     Patient location during evaluation: PACU Anesthesia Type: Spinal Level of consciousness: oriented and awake and alert Pain management: pain level controlled Vital Signs Assessment: post-procedure vital signs reviewed and stable Respiratory status: spontaneous breathing, respiratory function stable and nonlabored ventilation Cardiovascular status: blood pressure returned to baseline and stable Postop Assessment: no headache, no backache, no apparent nausea or vomiting and spinal receding Anesthetic complications: no    Last Vitals:  Vitals:   04/13/19 1500 04/13/19 1515  BP: 115/66 118/69  Pulse: 71 62  Resp: 12 14  Temp:  (!) 36.2 C  SpO2: 98% 100%    Last Pain:  Vitals:   04/13/19 1618  TempSrc:   PainSc: 8                  Lidia Collum

## 2019-04-13 NOTE — H&P (Signed)
TOTAL KNEE ADMISSION H&P  Patient is being admitted for right total knee arthroplasty.  Subjective:  Chief Complaint:right knee pain.  HPI: [name], 85 y.o. female, has a history of pain and functional disability in the right knee due to arthritis and has failed non-surgical conservative treatments for greater than 12 weeks to includeNSAID's and/or analgesics, corticosteriod injections, viscosupplementation injections, flexibility and strengthening excercises, supervised PT with diminished ADL's post treatment, use of assistive devices, weight reduction as appropriate and activity modification.  Onset of symptoms was gradual, starting 5 years ago with gradually worsening course since that time. The patient noted no past surgery on the right knee(s).  Patient currently rates pain in the right knee(s) at 10 out of 10 with activity. Patient has night pain, worsening of pain with activity and weight bearing, pain that interferes with activities of daily living, pain with passive range of motion, crepitus and joint swelling.  Patient has evidence of subchondral cysts, subchondral sclerosis, periarticular osteophytes and joint space narrowing by imaging studies. There is no active infection.  Patient Active Problem List   Diagnosis Date Noted  . Unilateral primary osteoarthritis, right knee 07/03/2018  . Precordial pain   . Chest pain 12/08/2015  . Weight loss 06/07/2014  . Hyponatremia 10/02/2013  . Chronic cough 10/02/2013  . Anxiety disorder 10/02/2013  . Chronic constipation 10/02/2013  . Leukopenia 05/31/2013  . Microcytic anemia 05/31/2013  . Anemia of chronic illness 06/02/2012  . Bruising 01/13/2012  . Muscle ache 01/13/2012  . Hypothyroidism   . CAD (coronary artery disease)   . Leukocytopenia   . Chest pain 04/01/2011  . CONSTIPATION 08/08/2008  . COUGH 07/03/2007  . DEPRESSION 06/26/2007  . HYSTERECTOMY, HX OF 06/26/2007  . Other acquired absence of organ  06/26/2007  . COLON POLYP 09/15/2006  . Hyperlipidemia 09/15/2006  . OBESITY, NOS 09/15/2006  . Essential hypertension, benign 09/15/2006  . CORONARY, ARTERIOSCLEROSIS 09/15/2006  . RHINITIS, ALLERGIC 09/15/2006  . ASTHMA, UNSPECIFIED 09/15/2006  . GASTROESOPHAGEAL REFLUX, NO ESOPHAGITIS 09/15/2006  . DIVERTICULITIS OF COLON, NOS 09/15/2006  . OSTEOARTHRITIS, MULTI SITES 09/15/2006  . INCONTINENCE, URGE 09/15/2006   Past Medical History:  Diagnosis Date  . Anemia 06/02/2012  . Anginal pain ([place])    complained of chest pain last week lasting momentarily week of 04/02/2019 took reflux medication, then took a nitro and laid down and got relief  . Anxiety   . Arthritis   . CAD (coronary artery disease)    a. 2005 small inferoapical MI --> medical therapy b. 2007 - nomral coronaries c. 11/2015 mid to distal LAD 20% stenosis, 1st diag 30%, and 50%,--> medical therapy    . Cataracts, bilateral 09/11/2011   Per report of pt.  . Esophageal abnormality 09/11/2011   Surgically treated per pt.  Marland Kitchen GERD (gastroesophageal reflux disease)   . Headache(784.0)   . Hypercholesterolemia 09/11/2011  . Hyperlipemia   . Hypertension   . Hypothyroid 09/11/2011  . Hypothyroidism   . Insect bite of ankle, left 09/11/2011   back of ankle  . Kidney mass 09/11/2011   Per report of pt  . Leukocytopenia   . Liver mass 09/11/2011   Per report of pt  . Mental health disorder   . Myocardial infarction ([place]) 09/11/2011   Per pt in 2005 (small vessel)  . Osteoporosis 09/11/2011  . Pneumonia   . Thigh cramp 09/11/2011   Left thigh x 3 nights since starting unspecified psychotropic medication    Past Surgical History:  Procedure Laterality Date  . ABDOMINAL HYSTERECTOMY    . APPENDECTOMY    . BREAST BIOPSY     biopsy on R breast (benign)  . BREAST EXCISIONAL BIOPSY    . CARDIAC CATHETERIZATION N/A 12/10/2015   Procedure: Left Heart Cath and Coronary Angiography;  Surgeon: Burnell Blanks, MD;  Location:  [facility];  Service: Cardiovascular;  Laterality: N/A;  . CARDIOVASCULAR STRESS TEST  08-15-2007   EF 59%  . CHOLECYSTECTOMY OPEN    . GALLBLADDER SURGERY  09/11/2011  . HYSTEROTOMY    . KNEE SURGERY  09/11/2011  . SHOULDER SURGERY  09/11/2011   Right side  . TONSILLECTOMY    . US ECHOCARDIOGRAPHY  11/25/2015   Est EF 55-60%    No current facility-administered medications for this encounter.    Current Outpatient Medications  Medication Sig Dispense Refill Last Dose  . acetaminophen (TYLENOL) 500 MG tablet Take 1,000 mg by mouth every 6 (six) hours as needed for moderate pain.      Marland Kitchen amLODipine (NORVASC) 5 MG tablet Take 5 mg by mouth daily.      Marland Kitchen aspirin EC 81 MG tablet Take 81 mg by mouth daily.     . Cholecalciferol 250 MCG (10000 UT) TABS Take 1,000 Units by mouth daily.      . cloNIDine (CATAPRES) 0.1 MG tablet Take 0.1 mg by mouth daily.      Marland Kitchen docusate sodium (COLACE) 100 MG capsule Take 100-200 mg by mouth daily as needed for moderate constipation.      . famotidine (PEPCID) 10 MG tablet Take 10 mg by mouth daily.      . hydrochlorothiazide (HYDRODIURIL) 12.5 MG tablet Take 12.5 mg by mouth daily.      Marland Kitchen levothyroxine (SYNTHROID, LEVOTHROID) 112 MCG tablet Take 112 mcg by mouth daily before breakfast.      . LINZESS 145 MCG CAPS capsule Take 145 mcg by mouth daily.      Marland Kitchen loratadine (CLARITIN) 10 MG tablet Take 10 mg by mouth daily.     . Multiple Vitamin (MULTIVITAMIN WITH MINERALS) TABS tablet Take 1 tablet by mouth daily.     . nitroGLYCERIN (NITROSTAT) 0.4 MG SL tablet Place 1 tablet (0.4 mg total) under the tongue every 5 (five) minutes x 3 doses as needed for chest pain. 25 tablet 3   . OLANZapine (ZYPREXA) 2.5 MG tablet Take 2.5 mg by mouth at bedtime.     Marland Kitchen olmesartan (BENICAR) 40 MG tablet Take 40 mg by mouth daily.     . potassium chloride SA (K-DUR,KLOR-CON) 20 MEQ tablet Take 20 mEq by mouth daily.     . Probiotic Product (ALIGN) 4 MG CAPS Take 4 mg by mouth  daily.     . simvastatin (ZOCOR) 10 MG tablet Take 5 mg by mouth daily.     . traZODone (DESYREL) 150 MG tablet Take 225 mg by mouth at bedtime.     . vitamin C (ASCORBIC ACID) 500 MG tablet Take 500 mg by mouth daily.     . Azelastine HCl 0.15 % SOLN USE 1 SPRAY IN EACH NOSTRIL UP TO TWO TIMES DAILY AS  NEEDED (Patient not taking: Reported on 04/06/2019) 30 mL 0 Not Taking at Unknown time  . polyethylene glycol (MIRALAX) packet Take 17 g by mouth 2 (two) times daily. (Patient not taking: Reported on 04/06/2019) 60 packet 6 Not Taking at Unknown time  . traZODone (DESYREL) 100 MG tablet Take 2 tablets (200  mg total) by mouth at bedtime. (Patient not taking: Reported on 04/06/2019) 60 tablet 11 Not Taking at Unknown time   Allergies  Allergen Reactions  . Statins Other (See Comments)    Muscle aches  . Cholestatin   . Rosuvastatin Other (See Comments)  . Doxycycline Itching and Rash  . Prednisone Itching    Social History   Tobacco Use  . Smoking status: Never Smoker  . Smokeless tobacco: Never Used  Substance Use Topics  . Alcohol use: No    Family History  Problem Relation Age of Onset  . Hypertension Sister   . Hypertension Brother      Review of Systems  Musculoskeletal: Positive for joint pain.  All other systems reviewed and are negative.   Objective:  Physical Exam  Constitutional: She is oriented to person, place, and time. She appears well-developed and well-nourished.  HENT:  Head: Normocephalic and atraumatic.  Eyes: Pupils are equal, round, and reactive to light. EOM are normal.  Neck: Normal range of motion. Neck supple.  Cardiovascular: Normal rate.  Respiratory: Effort normal.  GI: Soft.  Musculoskeletal:     Right knee: She exhibits decreased range of motion, effusion, abnormal alignment, bony tenderness and abnormal meniscus. Tenderness found. Medial joint line and lateral joint line tenderness noted.  Neurological: She is alert and oriented to person,  place, and time.  Skin: Skin is warm and dry.  Psychiatric: She has a normal mood and affect.    Vital signs in last 24 hours:    Labs:   Estimated body mass index is 28.81 kg/m as calculated from the following:   Height as of 04/11/19: 5' 0.5" (1.537 m).   Weight as of 04/11/19: 68 kg.   Imaging Review Plain radiographs demonstrate severe degenerative joint disease of the right knee(s). The overall alignment ismild varus. The bone quality appears to be good for age and reported activity level.      Assessment/Plan:  End stage arthritis, right knee   The patient history, physical examination, clinical judgment of the provider and imaging studies are consistent with end stage degenerative joint disease of the right knee(s) and total knee arthroplasty is deemed medically necessary. The treatment options including medical management, injection therapy arthroscopy and arthroplasty were discussed at length. The risks and benefits of total knee arthroplasty were presented and reviewed. The risks due to aseptic loosening, infection, stiffness, patella tracking problems, thromboembolic complications and other imponderables were discussed. The patient acknowledged the explanation, agreed to proceed with the plan and consent was signed. Patient is being admitted for inpatient treatment for surgery, pain control, PT, OT, prophylactic antibiotics, VTE prophylaxis, progressive ambulation and ADL's and discharge planning. The patient is planning to be discharged to be determined depending on her post-op course    Anticipated LOS equal to or greater than 2 midnights due to - Age 18 and older with one or more of the following:  - Obesity  - Expected need for hospital services (PT, OT, Nursing) required for safe  discharge  - Anticipated need for postoperative skilled nursing care or inpatient rehab  - Active co-morbidities: None OR   - Unanticipated findings during/Post Surgery: None  -  Patient is a high risk of re-admission due to: None

## 2019-04-13 NOTE — Op Note (Signed)
NAME: [name], [name] MEDICAL RECORD [id number] ACCOUNT [id number] DATE OF BIRTH:07-09-1932 FACILITY[facility] PHYSICIAN:Alainah Phang Kerry Fort, MD  OPERATIVE REPORT  DATE OF PROCEDURE:  04/13/2019  PREOPERATIVE DIAGNOSIS:  Severe primary osteoarthritis and degenerative joint disease, right knee.  POSTOPERATIVE DIAGNOSIS:  Severe primary osteoarthritis and degenerative joint disease, right knee.  PROCEDURE:  Right total knee arthroplasty.  IMPLANTS:  Stryker Triathlon cemented knee system with size 4 femur, size 4 tibial tray, 11 mm, fixed-bearing polyethylene insert, size 35 patellar button.  SURGEON:  Lind Guest. Ninfa Linden, MD  ASSISTANT:  Erskine Emery, PA-C  ANESTHESIA: 1.  Right lower extremity adductor canal block. 2.  Spinal.  ANTIBIOTICS:  Two grams IV Ancef.  ESTIMATED BLOOD LOSS:  150-200 mL.  COMPLICATIONS:  None.  INDICATIONS:  The patient is a pleasant 85 year old female with debilitating arthritis involving her right knee.  She actually has a flexion contracture of that knee of about 5-10 degrees and severe pain.  I can only flex her to just past 90 degrees.   Her x-rays show complete loss of the joint space throughout the knee with large osteophytes and loose bodies.  At this point, she has tried and failed all forms of conservative treatment and does wish to proceed with a right total knee arthroplasty.  She  has a remote history of a left knee that was replaced many years ago.  Her pain is daily and it is detrimentally affecting her activities of daily living, her quality of life and her mobility to the point she does wish to proceed with the surgery.  She  understands given her advanced age, there is certainly a lot of risks that are involved with the surgery.  Most risks include acute blood loss anemia, nerve or vessel injury, fracture, infection, DVT, and implant failure.  She understands our goals are  to decrease pain, improve  mobility and overall improve quality of life.  DESCRIPTION OF PROCEDURE:  After informed consent was obtained and appropriate right leg was marked, an adductor canal block was obtained in the holding room.  She was then brought to the operating room and sat up on the operating table where spinal  anesthesia was obtained.  She was laid back supine on the operating table.  Foley catheter was placed and nonsterile tourniquet was placed around her upper right thigh.  Her right thigh, knee, leg, ankle and foot were prepped and draped with DuraPrep and  sterile drapes including a sterile stockinette.  Time-out was called to identify correct patient and correct right knee.  We then used an Esmarch to wrap on the right leg and tourniquet was inflated to 300 mm of pressure.  We then made an incision over  the patella and carried this proximally and distally.  We dissected down the knee joint carried out a medial parapatellar arthrotomy finding a large joint effusion and significant deformity within the right knee with complete loss of cartilage.  We put  the knee in a flexed position, removing remnants of ACL, PCL, medial and lateral meniscus as much osteophytes and loose bodies as we could.  We then used our extramedullary cutting guide for taking our tibia cut, making this cut with a neutral slope and  correcting for varus and valgus and taking 9 mm off the high side.  We made that cut without difficulty, but then I backed it down 2 more mm of the cut considering her significant deformity.  We then went to the femur and used an  intramedullary guide for  distal femoral cut setting this distal femoral cutting guide for right knee at 5 degrees externally rotated.  We made this for a 10 mm distal femoral cut.  Once we did this, we brought the knee back down to full extension and placed a 9 mm extension  block and we achieved full extension.  We then went back to the femur and put our femoral sizing guide based off the  epicondylar axis and based off of this, we chose a size 4 femur.  We put our 4-in-1 cutting block for a size 4 femur, made our anterior  and posterior cuts, followed by our chamfer cuts.  We then made our femoral box cut.  Attention was then turned back to the tibia.  We chose a size 4 tibial tray for coverage setting rotation off the tibial tubercle and the femur.  We chose a size 4  tibial tray and then made our keel punch off of this.  With the size 4 trial tibia followed by the size 4 right femur, we trialed a 9 mm fixed bearing polyethylene insert.  It felt stable, but I felt like she needed at least going up to 11 for the real  insert.  We then made our patellar cut and drilled 3 holes for a size 35 patellar button.  We then removed all trial instrumentation from the knee and irrigated the knee with normal saline solution.  Once we dried the knee well, we mixed our cement and  then cemented the real Stryker Triathlon tibial tray size 4 followed by the size 4 right femur.  We placed our real 11 mm fixed bearing polyethylene insert and cemented our patellar button.  We then held her knee in a fully extended position while the  cement hardened and cured.  We then let the tourniquet down.  Hemostasis obtained with electrocautery.  We cleaned any cement debris that we could see from the knee and then closed the arthrotomy with interrupted #1 Vicryl suture.  We closed deep tissue  was 0 Vicryl followed by 2-0 Vicryl subcutaneous tissue and interrupted staples on the skin.  A Xeroform well-padded sterile dressing was applied.  She was taken to recovery room in stable condition.  All final counts were correct.  No complications  noted.  Of note Benita Stabile, PA-C, assisted in the entire case.  His assistance was crucial for facilitating all aspects of this case.  TN/NUANCE  D:04/13/2019 T:04/13/2019 JOB:[id number]

## 2019-04-13 NOTE — Telephone Encounter (Signed)
Risk of bleeding will be increased. Procedure may need to be postpone. Contact MD/anesthesiologist performing procedure for final answer.

## 2019-04-14 DIAGNOSIS — F419 Anxiety disorder, unspecified: Secondary | ICD-10-CM | POA: Diagnosis present

## 2019-04-14 DIAGNOSIS — M1711 Unilateral primary osteoarthritis, right knee: Secondary | ICD-10-CM | POA: Diagnosis present

## 2019-04-14 DIAGNOSIS — E785 Hyperlipidemia, unspecified: Secondary | ICD-10-CM | POA: Diagnosis present

## 2019-04-14 DIAGNOSIS — D62 Acute posthemorrhagic anemia: Secondary | ICD-10-CM | POA: Diagnosis not present

## 2019-04-14 DIAGNOSIS — Z79890 Hormone replacement therapy (postmenopausal): Secondary | ICD-10-CM | POA: Diagnosis not present

## 2019-04-14 DIAGNOSIS — Z23 Encounter for immunization: Secondary | ICD-10-CM | POA: Diagnosis present

## 2019-04-14 DIAGNOSIS — R339 Retention of urine, unspecified: Secondary | ICD-10-CM | POA: Diagnosis not present

## 2019-04-14 DIAGNOSIS — Z96652 Presence of left artificial knee joint: Secondary | ICD-10-CM | POA: Diagnosis present

## 2019-04-14 DIAGNOSIS — I2510 Atherosclerotic heart disease of native coronary artery without angina pectoris: Secondary | ICD-10-CM | POA: Diagnosis present

## 2019-04-14 DIAGNOSIS — Z79899 Other long term (current) drug therapy: Secondary | ICD-10-CM | POA: Diagnosis not present

## 2019-04-14 DIAGNOSIS — K219 Gastro-esophageal reflux disease without esophagitis: Secondary | ICD-10-CM | POA: Diagnosis present

## 2019-04-14 DIAGNOSIS — K5900 Constipation, unspecified: Secondary | ICD-10-CM | POA: Diagnosis not present

## 2019-04-14 DIAGNOSIS — Z20828 Contact with and (suspected) exposure to other viral communicable diseases: Secondary | ICD-10-CM | POA: Diagnosis present

## 2019-04-14 DIAGNOSIS — Z7982 Long term (current) use of aspirin: Secondary | ICD-10-CM | POA: Diagnosis not present

## 2019-04-14 DIAGNOSIS — E039 Hypothyroidism, unspecified: Secondary | ICD-10-CM | POA: Diagnosis present

## 2019-04-14 DIAGNOSIS — I10 Essential (primary) hypertension: Secondary | ICD-10-CM | POA: Diagnosis present

## 2019-04-14 DIAGNOSIS — M25561 Pain in right knee: Secondary | ICD-10-CM | POA: Diagnosis present

## 2019-04-14 DIAGNOSIS — E7800 Pure hypercholesterolemia, unspecified: Secondary | ICD-10-CM | POA: Diagnosis present

## 2019-04-14 DIAGNOSIS — I252 Old myocardial infarction: Secondary | ICD-10-CM | POA: Diagnosis not present

## 2019-04-14 LAB — CBC WITH DIFFERENTIAL/PLATELET
Abs Immature Granulocytes: 0.04 10*3/uL (ref 0.00–0.07)
Basophils Absolute: 0 10*3/uL (ref 0.0–0.1)
Basophils Relative: 0 %
Eosinophils Absolute: 0 10*3/uL (ref 0.0–0.5)
Eosinophils Relative: 0 %
HCT: 31.4 % — ABNORMAL LOW (ref 36.0–46.0)
Hemoglobin: 9.5 g/dL — ABNORMAL LOW (ref 12.0–15.0)
Immature Granulocytes: 1 %
Lymphocytes Relative: 17 %
Lymphs Abs: 1.1 10*3/uL (ref 0.7–4.0)
MCH: 23.9 pg — ABNORMAL LOW (ref 26.0–34.0)
MCHC: 30.3 g/dL (ref 30.0–36.0)
MCV: 78.9 fL — ABNORMAL LOW (ref 80.0–100.0)
Monocytes Absolute: 0.9 10*3/uL (ref 0.1–1.0)
Monocytes Relative: 13 %
Neutro Abs: 4.6 10*3/uL (ref 1.7–7.7)
Neutrophils Relative %: 69 %
Platelets: 130 10*3/uL — ABNORMAL LOW (ref 150–400)
RBC: 3.98 MIL/uL (ref 3.87–5.11)
RDW: 15.9 % — ABNORMAL HIGH (ref 11.5–15.5)
WBC: 6.7 10*3/uL (ref 4.0–10.5)
nRBC: 0 % (ref 0.0–0.2)

## 2019-04-14 LAB — CBC
HCT: 29 % — ABNORMAL LOW (ref 36.0–46.0)
Hemoglobin: 8.7 g/dL — ABNORMAL LOW (ref 12.0–15.0)
MCH: 23.7 pg — ABNORMAL LOW (ref 26.0–34.0)
MCHC: 30 g/dL (ref 30.0–36.0)
MCV: 79 fL — ABNORMAL LOW (ref 80.0–100.0)
Platelets: 127 10*3/uL — ABNORMAL LOW (ref 150–400)
RBC: 3.67 MIL/uL — ABNORMAL LOW (ref 3.87–5.11)
RDW: 15.9 % — ABNORMAL HIGH (ref 11.5–15.5)
WBC: 5.3 10*3/uL (ref 4.0–10.5)
nRBC: 0 % (ref 0.0–0.2)

## 2019-04-14 LAB — BASIC METABOLIC PANEL
Anion gap: 4 — ABNORMAL LOW (ref 5–15)
BUN: 7 mg/dL — ABNORMAL LOW (ref 8–23)
CO2: 31 mmol/L (ref 22–32)
Calcium: 8.5 mg/dL — ABNORMAL LOW (ref 8.9–10.3)
Chloride: 100 mmol/L (ref 98–111)
Creatinine, Ser: 0.62 mg/dL (ref 0.44–1.00)
GFR calc Af Amer: >60 mL/min (ref >60)
GFR calc non Af Amer: >60 mL/min (ref >60)
Glucose, Bld: 140 mg/dL — ABNORMAL HIGH (ref 70–99)
Potassium: 3.8 mmol/L (ref 3.5–5.1)
Sodium: 135 mmol/L (ref 135–145)

## 2019-04-14 MED ORDER — ASPIRIN 81 MG PO CHEW: 81.0000 mg | CHEWABLE_TABLET | Freq: Two times a day (BID) | ORAL | 0 refills | Status: DC

## 2019-04-14 MED ORDER — INFLUENZA VAC A&B SA ADJ QUAD 0.5 ML IM PRSY
0.5000 mL | PREFILLED_SYRINGE | INTRAMUSCULAR | Status: AC
  Administered 2019-04-15: 0.5 mL via INTRAMUSCULAR
  Filled 2019-04-14: qty 0.5

## 2019-04-14 MED ORDER — FERROUS GLUCONATE 324 (38 FE) MG PO TABS
324.0000 mg | ORAL_TABLET | Freq: Two times a day (BID) | ORAL | Status: DC
  Administered 2019-04-14 – 2019-04-19 (×10): 324 mg via ORAL
  Filled 2019-04-14 (×11): qty 1

## 2019-04-14 MED ORDER — HYDROCODONE-ACETAMINOPHEN 5-325 MG PO TABS: 1.0000 | ORAL_TABLET | ORAL | 0 refills | Status: DC | PRN

## 2019-04-14 NOTE — TOC Initial Note (Signed)
Transition of Care [facility]) - Initial/Assessment Note    Patient Details  Name: [name] MRN: [id number] Date of Birth: [birth year]  Transition of [facility]) CM/SW Contact:    Joaquin Courts, RN Phone Number: 04/14/2019, 11:40 AM  Clinical Narrative:                 CM spoke with patient at bedside. Patient set up with [facility] home for [place]. [employer] to deliver rolling walker and 3-in-1 to bedside for home use.  Expected Discharge Plan: [name] Barriers to Discharge: Continued Medical Work up   Patient Goals and CMS Choice Patient states their goals for this hospitalization and ongoing recovery are:: to go home CMS Medicare.gov Compare Post Acute Care list provided to:: Patient Choice offered to / list presented to : Patient  Expected Discharge Plan and Services Expected Discharge Plan: [place]   Discharge Planning Services: CM Consult Post Acute Care Choice: [place] arrangements for the past 2 months: Single Family Home                 DME Arranged: 3-N-1, Walker rolling DME Agency: [employer] Date DME Agency Contacted: 04/14/19 Time DME Agency Contacted: [id number] Representative spoke with at DME Agency: [place]: PT [place]: [facility] [employer]     Representative spoke with at [place]: pre arranged in MD office  Prior Living Arrangements/Services Living arrangements for the past 2 months: [place] with:: Self Patient language and need for interpreter reviewed:: Yes Do you feel safe going back to the place where you live?: Yes      Need for Family Participation in Patient Care: Yes (Comment) Care giver support system in place?: Yes (comment)   Criminal Activity/Legal Involvement Pertinent to Current Situation/Hospitalization: No - Comment as needed  Activities of Daily Living Home Assistive Devices/Equipment: Eyeglasses, Shower chair with back,  Cane (specify quad or straight) ADL Screening (condition at time of admission) Patient's cognitive ability adequate to safely complete daily activities?: Yes Is the patient deaf or have difficulty hearing?: No Does the patient have difficulty seeing, even when wearing glasses/contacts?: No Does the patient have difficulty concentrating, remembering, or making decisions?: No Patient able to express need for assistance with ADLs?: Yes Does the patient have difficulty dressing or bathing?: No Independently performs ADLs?: Yes (appropriate for developmental age) Does the patient have difficulty walking or climbing stairs?: Yes Weakness of Legs: Both Weakness of Arms/Hands: Both  Permission Sought/Granted                  Emotional Assessment Appearance:: Appears stated age Attitude/Demeanor/Rapport: Engaged Affect (typically observed): Accepting Orientation: : Oriented to Self, Oriented to Place, Oriented to  Time, Oriented to Situation   Psych Involvement: No (comment)  Admission diagnosis:  osteoarthritis right knee Patient Active Problem List   Diagnosis Date Noted  . Anemia due to acute blood loss 04/14/2019    Class: Acute  . Status post total right knee replacement 04/13/2019  . Unilateral primary osteoarthritis, right knee 07/03/2018  . Precordial pain   . Chest pain 12/08/2015  . Weight loss 06/07/2014  . Hyponatremia 10/02/2013  . Chronic cough 10/02/2013  . Anxiety disorder 10/02/2013  . Chronic constipation 10/02/2013  . Leukopenia 05/31/2013  . Microcytic anemia 05/31/2013  . Anemia of chronic illness 06/02/2012  . Bruising 01/13/2012  . Muscle ache 01/13/2012  . Hypothyroidism   .  CAD (coronary artery disease)   . Leukocytopenia   . Chest pain 04/01/2011  . CONSTIPATION 08/08/2008  . COUGH 07/03/2007  . DEPRESSION 06/26/2007  . HYSTERECTOMY, HX OF 06/26/2007  . Other acquired absence of organ 06/26/2007  . COLON POLYP 09/15/2006  . Hyperlipidemia  09/15/2006  . OBESITY, NOS 09/15/2006  . Essential hypertension, benign 09/15/2006  . CORONARY, ARTERIOSCLEROSIS 09/15/2006  . RHINITIS, ALLERGIC 09/15/2006  . ASTHMA, UNSPECIFIED 09/15/2006  . GASTROESOPHAGEAL REFLUX, NO ESOPHAGITIS 09/15/2006  . DIVERTICULITIS OF COLON, NOS 09/15/2006  . OSTEOARTHRITIS, MULTI SITES 09/15/2006  . INCONTINENCE, URGE 09/15/2006   PCP:  Marton Redwood, MD Pharmacy:   [place], [place] [address] Phone: [phone] Fax: [place], [place] [address] Phone: [phone] Fax: [phone]  [place], [place] [facility] [address] [place] [address] [place] [address] Phone: [phone] Fax: [phone]     Social Determinants of Health ([place]) Interventions    Readmission Risk Interventions No flowsheet data found.

## 2019-04-14 NOTE — Progress Notes (Signed)
Physical Therapy Treatment Patient Details Name: [name] MRN: [id number] DOB: [birth year] Today's Date: 04/14/2019    History of Present Illness Patient is 85 y.o. female s.p Rt TKA on 04/13/19 with PMH significant for HTN, HLD, GERD, CAD, anxiety, osteoporosis, and history of MI with cardiac cath in 2013.    PT Comments    Some improvement in pain control this afternoon. Pt was able to walk to and from the hallway just outside of her room. Will continue to progress activity as tolerated.    Follow Up Recommendations  Follow surgeon's recommendation for DC plan and follow-up therapies;SNF     Equipment Recommendations  Rolling walker with 5" wheels    Recommendations for Other Services       Precautions / Restrictions Precautions Precautions: Fall;Knee Restrictions Weight Bearing Restrictions: No Other Position/Activity Restrictions: WBAT    Mobility  Bed Mobility Overal bed mobility: Needs Assistance Bed Mobility: Supine to Sit;Sit to Supine     Supine to sit: Min assist;HOB elevated Sit to supine: Min assist;HOB elevated   General bed mobility comments: Assist for R LE. Increased time. Cues for technique.  Transfers Overall transfer level: Needs assistance Equipment used: Rolling walker (2 wheeled) Transfers: Sit to/from Stand Sit to Stand: Min assist;From elevated surface Stand pivot transfers: Min assist       General transfer comment: Assist to rise, stabilize, control descent. Multimodal cueing required. Increased time.  Ambulation/Gait Ambulation/Gait assistance: Min assist Gait Distance (Feet): 30 Feet Assistive device: Rolling walker (2 wheeled) Gait Pattern/deviations: Step-to pattern;Trunk flexed;Antalgic     General Gait Details: VCs for safety, posture, sequence, step length, proper use of RW. Slow gait speed. Distance limited by pain, fatigue.   Stairs             Wheelchair Mobility    Modified Rankin (Stroke  Patients Only)       Balance Overall balance assessment: Needs assistance         Standing balance support: Bilateral upper extremity supported Standing balance-Leahy Scale: Poor                              Cognition Arousal/Alertness: Awake/alert Behavior During Therapy: WFL for tasks assessed/performed Overall Cognitive Status: Impaired/Different from baseline Area of Impairment: Memory;Problem solving                     Memory: Decreased short-term memory       Problem Solving: Requires tactile cues;Requires verbal cues        Exercises    General Comments        Pertinent Vitals/Pain Pain Assessment: 0-10 Pain Score: 6  Pain Location: Rt Knee Pain Descriptors / Indicators: Aching;Sore;Grimacing;Discomfort Pain Intervention(s): Limited activity within patient's tolerance;Monitored during session;Repositioned    Home Living                      Prior Function            PT Goals (current goals can now be found in the care plan section) Progress towards PT goals: Progressing toward goals    Frequency    7X/week      PT Plan Current plan remains appropriate    Co-evaluation              AM-PAC PT "6 Clicks" Mobility   Outcome Measure  Help needed turning from your back to your side while in a  flat bed without using bedrails?: A Little Help needed moving from lying on your back to sitting on the side of a flat bed without using bedrails?: A Little Help needed moving to and from a bed to a chair (including a wheelchair)?: A Little Help needed standing up from a chair using your arms (e.g., wheelchair or bedside chair)?: A Little Help needed to walk in hospital room?: A Lot Help needed climbing 3-5 steps with a railing? : Total 6 Click Score: 15    End of Session Equipment Utilized During Treatment: Gait belt;Right knee immobilizer Activity Tolerance: Patient limited by fatigue;Patient limited by  pain Patient left: with call bell/phone within reach;with bed alarm set;with family/visitor present;in bed   PT Visit Diagnosis: Difficulty in walking, not elsewhere classified (R26.2);Muscle weakness (generalized) (M62.81);Other abnormalities of gait and mobility (R26.89);Unsteadiness on feet (R26.81);Pain Pain - Right/Left: Right Pain - part of body: Knee     Time: [id number] PT Time Calculation (min) (ACUTE ONLY): 21 min  Charges:  $Gait Training: 8-22 mins                       Weston Anna, PT Acute Rehabilitation Services Pager: [phone] Office: [phone]

## 2019-04-14 NOTE — Progress Notes (Signed)
    Home health agencies that serve [id number].        Home Health Agencies Search Results  Results List Table  Home Health Agency Information Quality of Patient Care Rating Patient Survey Summary Rating  [facility] [phone] 4 out of 5 stars 4 out of 5 stars  [facility] [phone] 3 out of 5 stars 4 out of 5 stars  [facility] [phone] 4  out of 5 stars 3 out of 5 stars  [facility] [phone] 4  out of 5 stars 4 out of 5 stars  [facility] [phone] 4 out of 5 stars 4 out of 5 stars  [facility] [phone] 4 out of 5 stars 4 out of 5 stars  [facility] [phone] 3  out of 5 stars 4 out of 5 stars  [facility] [phone] 2  out of 5 stars 3 out of 5 stars  [facility] [phone] 3 out of 5 stars 4 out of 5 stars  [facility] [phone] 4 out of 5 stars Not Available12  [facility] [phone] 3 out of 5 stars 4 out of 5 stars  [facility] [place] [phone] Not Available5 Not Available12  [facility] [phone] 3  out of 5 stars 3 out of 5 stars  [facility] [phone] 5 out of 5 stars 4 out of 5 stars  [facility] [phone] 3  out of 5 stars 4 out of 5 stars  [facility] [phone] 3  out of 5 stars 3 out of 5 stars  [facility] [phone] 3  out of 5 stars Not Available11  [facility] [phone] 4  out of 5 stars 3 out of 5 stars   Home Health Footnotes  Footnote number Footnote as displayed on Home Health Compare  1 This agency provides services under a federal waiver program to non-traditional, chronic long term population.  2 This agency provides services to a special needs population.  3 Not Available.  4 The number of patient episodes  for this measure is too small to report.  5 This measure currently does not have data or provider has been certified/recertified for less than 6 months.  6 The national average for this measure is not provided because of state-to-state differences in data collection.  7 Medicare is not displaying rates for this measure for any home health agency, because of an issue with the data.  8 There were problems with the data and they are being corrected.  9 Zero, or very few, patients met the survey's rules for inclusion. The scores shown, if any, reflect a very small number of surveys and may not accurately tell how an agency is doing.  10 Survey results are based on less than 12 months of data.  11 Fewer than 70 patients completed the survey. Use the scores shown, if any, with caution as the number of surveys may be too low to accurately tell how an agency is doing.  12 No survey results are available for this period.  13 Data suppressed by CMS for one or more quarters.    

## 2019-04-14 NOTE — Discharge Instructions (Signed)

## 2019-04-14 NOTE — Progress Notes (Signed)
Patient ID: [name], female   DOB: 1932-06-04, 85 y.o.   MRN: [id number] Thus far, unsafe for discharge to home and definitely not ok for discharge today.  Hgb has dropped (acute blood loss anemia), but vitals stable.  Has not had much mobility from PT, but PT is appropriately recommending likely short-term skilled nursing.  Will consult Social Work and will also make a full inpatient admission.

## 2019-04-14 NOTE — Progress Notes (Signed)
Patient ID: [name], female   DOB: 1932-07-14, 85 y.o.   MRN: [id number] Hgb rechecked 9.1 improved since this AM.

## 2019-04-14 NOTE — Progress Notes (Signed)
Physical Therapy Treatment Patient Details Name: [name] MRN: [id number] DOB: 01/07/1932 Today's Date: 04/14/2019    History of Present Illness Patient is 85 y.o. female s.p Rt TKA on 04/13/19 with PMH significant for HTN, HLD, GERD, CAD, anxiety, osteoporosis, and history of MI with cardiac cath in 2013.    PT Comments    Pt appears very drowsy this am. Mod encouragement for participation. Pt rated pain 8/10 with activity. Mobility is limited by pain and fatigue. Will continue to follow and progress activity as tolerated. May need to consider SNF placement-pt lives alone and she is not currently progressing well.     Follow Up Recommendations  Follow surgeon's recommendation for DC plan and follow-up therapies;SNF(may need SNF-not progressing well)     Equipment Recommendations  Rolling walker with 5" wheels    Recommendations for Other Services       Precautions / Restrictions Precautions Precautions: Fall;Knee Restrictions Weight Bearing Restrictions: No Other Position/Activity Restrictions: WBAT    Mobility  Bed Mobility Overal bed mobility: Needs Assistance Bed Mobility: Supine to Sit     Supine to sit: Mod assist;HOB elevated     General bed mobility comments: Assist for R LE and to scoot to EOB. Multimodal cueing required. Increased time. Utilized bedpad to aid with scooting.  Transfers Overall transfer level: Needs assistance Equipment used: Rolling walker (2 wheeled) Transfers: Sit to/from [facility] Sit to Stand: Min assist Stand pivot transfers: Min assist       General transfer comment: Assist to rise, stabilize, control descent. Multimodal cueing required. Increased time. Stand pivot, bed to recliner, using RW  Ambulation/Gait Ambulation/Gait assistance: Min assist;+2 safety/equipment Gait Distance (Feet): 10 Feet Assistive device: Rolling walker (2 wheeled) Gait Pattern/deviations: Step-to pattern;Trunk  flexed;Antalgic     General Gait Details: Mod VCs for safety, posture, sequence, step length. Slow gait speed. Distance limited by pain, fatigue. Followed closely with recliner   Stairs             Wheelchair Mobility    Modified Rankin (Stroke Patients Only)       Balance Overall balance assessment: Needs assistance         Standing balance support: Bilateral upper extremity supported Standing balance-Leahy Scale: Poor                              Cognition Arousal/Alertness: Awake/alert Behavior During Therapy: WFL for tasks assessed/performed Overall Cognitive Status: Within Functional Limits for tasks assessed                                        Exercises Total Joint Exercises Ankle Circles/Pumps: AROM;Both;10 reps;Supine Quad Sets: (pt too drowsy) Heel Slides: AAROM;Right;10 reps;Supine Hip ABduction/ADduction: AAROM;Right;10 reps;Supine Straight Leg Raises: AAROM;Right;10 reps;Supine    General Comments        Pertinent Vitals/Pain Pain Assessment: 0-10 Pain Score: 8  Pain Location: Rt Knee Pain Descriptors / Indicators: Aching;Sore;Dull;Grimacing;Discomfort;Guarding Pain Intervention(s): Limited activity within patient's tolerance;Monitored during session;Repositioned;Ice applied    Home Living                      Prior Function            PT Goals (current goals can now be found in the care plan section) Progress towards PT goals: Progressing toward goals  Frequency    7X/week      PT Plan Current plan remains appropriate    Co-evaluation              AM-PAC PT "6 Clicks" Mobility   Outcome Measure  Help needed turning from your back to your side while in a flat bed without using bedrails?: A Lot Help needed moving from lying on your back to sitting on the side of a flat bed without using bedrails?: A Lot Help needed moving to and from a bed to a chair (including a wheelchair)?:  A Lot Help needed standing up from a chair using your arms (e.g., wheelchair or bedside chair)?: A Lot Help needed to walk in hospital room?: A Lot Help needed climbing 3-5 steps with a railing? : Total 6 Click Score: 11    End of Session Equipment Utilized During Treatment: Gait belt Activity Tolerance: Patient limited by fatigue;Patient limited by pain Patient left: in chair;with call bell/phone within reach;with chair alarm set   PT Visit Diagnosis: Difficulty in walking, not elsewhere classified (R26.2);Muscle weakness (generalized) (M62.81);Other abnormalities of gait and mobility (R26.89);Unsteadiness on feet (R26.81);Pain Pain - Right/Left: Right Pain - part of body: Knee     Time: [id number] PT Time Calculation (min) (ACUTE ONLY): 21 min  Charges:  $Gait Training: 8-22 mins                        Weston Anna, [place] Pager: [phone] Office: [phone] ]

## 2019-04-14 NOTE — Plan of Care (Signed)
  Problem: Health Behavior/Discharge Planning: Goal: Ability to manage health-related needs will improve Outcome: Progressing   Problem: Clinical Measurements: Goal: Diagnostic test results will improve Outcome: Progressing Goal: Respiratory complications will improve Outcome: Progressing Goal: Cardiovascular complication will be avoided Outcome: Progressing   Problem: Activity: Goal: Risk for activity intolerance will decrease Outcome: Progressing   Problem: Nutrition: Goal: Adequate nutrition will be maintained Outcome: Progressing   Problem: Coping: Goal: Level of anxiety will decrease Outcome: Progressing   Problem: Elimination: Goal: Will not experience complications related to bowel motility Outcome: Progressing Goal: Will not experience complications related to urinary retention Outcome: Progressing   Problem: Pain Managment: Goal: General experience of comfort will improve Outcome: Progressing   Problem: Safety: Goal: Ability to remain free from injury will improve Outcome: Progressing   Problem: Skin Integrity: Goal: Risk for impaired skin integrity will decrease Outcome: Progressing   

## 2019-04-14 NOTE — Progress Notes (Addendum)
     Subjective: 1 Day Post-Op Procedure(s) (LRB): RIGHT TOTAL KNEE ARTHROPLASTY (Right) Right knee dressing intact. Moderate to severe pain, withdrawn some. I told her to contact nursing for pain control if meds are not adequate and I spoke with nursing, O2 sat monitor if needs significant narcotics as she is 85 years old and prone to narcotic build up.  Patient reports pain as marked.    Objective:   VITALS:  Temp:  [97.1 F (36.2 C)-98.3 F (36.8 C)] 97.5 F (36.4 C) (09/26 0556) Pulse Rate:  [51-82] 77 (09/26 0558) Resp:  [12-23] 18 (09/26 0556) BP: (115-152)/(58-82) 150/73 (09/26 0556) SpO2:  [88 %-100 %] 94 % (09/26 0558) Weight:  [68 kg] 68 kg (09/25 1326)  Neurologically intact ABD soft Neurovascular intact Sensation intact distally Intact pulses distally Dorsiflexion/Plantar flexion intact Incision: dressing C/D/I and no drainage No cellulitis present Compartment soft   LABS Recent Labs    04/11/19 1419 04/14/19 0251  HGB 11.5* 8.7*  WBC 2.8* 5.3  PLT 176 127*   Recent Labs    04/11/19 1419 04/14/19 0251  NA 137 135  K 4.0 3.8  CL 100 100  CO2 30 31  BUN 14 7*  CREATININE 0.78 0.62  GLUCOSE 87 140*   No results for input(s): LABPT, INR in the last 72 hours.   Assessment/Plan: 1 Day Post-Op Procedure(s) (LRB): RIGHT TOTAL KNEE ARTHROPLASTY (Right) Acute blood loss intra op Hgb 8.6  Advance diet Up with therapy  Probably should keep an IV access. Hgb is 8.7 and will need careful reevaluation.  Start ferrous gluconate.   Basil Dess 04/14/2019, 9:22 AMPatient ID: Ivan Anchors, female   DOB: 10-15-1931, 85 y.o.   MRN: [id number]

## 2019-04-15 LAB — CBC WITH DIFFERENTIAL/PLATELET
Abs Immature Granulocytes: 0.03 10*3/uL (ref 0.00–0.07)
Basophils Absolute: 0 10*3/uL (ref 0.0–0.1)
Basophils Relative: 0 %
Eosinophils Absolute: 0 10*3/uL (ref 0.0–0.5)
Eosinophils Relative: 0 %
HCT: 28.3 % — ABNORMAL LOW (ref 36.0–46.0)
Hemoglobin: 8.8 g/dL — ABNORMAL LOW (ref 12.0–15.0)
Immature Granulocytes: 0 %
Lymphocytes Relative: 13 %
Lymphs Abs: 0.9 10*3/uL (ref 0.7–4.0)
MCH: 24 pg — ABNORMAL LOW (ref 26.0–34.0)
MCHC: 31.1 g/dL (ref 30.0–36.0)
MCV: 77.1 fL — ABNORMAL LOW (ref 80.0–100.0)
Monocytes Absolute: 0.6 10*3/uL (ref 0.1–1.0)
Monocytes Relative: 9 %
Neutro Abs: 5.3 10*3/uL (ref 1.7–7.7)
Neutrophils Relative %: 78 %
Platelets: 116 10*3/uL — ABNORMAL LOW (ref 150–400)
RBC: 3.67 MIL/uL — ABNORMAL LOW (ref 3.87–5.11)
RDW: 15.6 % — ABNORMAL HIGH (ref 11.5–15.5)
WBC: 6.9 10*3/uL (ref 4.0–10.5)
nRBC: 0 % (ref 0.0–0.2)

## 2019-04-15 MED ORDER — PHENAZOPYRIDINE HCL 100 MG PO TABS
100.0000 mg | ORAL_TABLET | Freq: Three times a day (TID) | ORAL | Status: AC
  Administered 2019-04-15 – 2019-04-18 (×9): 100 mg via ORAL
  Filled 2019-04-15 (×9): qty 1

## 2019-04-15 NOTE — Progress Notes (Signed)
Physical Therapy Treatment Patient Details Name: [name] MRN: [id number] DOB: 04/05/1932 Today's Date: 04/15/2019    History of Present Illness 85 y.o. female s.p Rt TKA on 04/13/19 with PMH significant for HTN, HLD, GERD, CAD, anxiety, osteoporosis, and history of MI with cardiac cath in 2013.    PT Comments    Progressing with mobility. Continue to recommend ST SNF if pt will not have 24/7 care at home.    Follow Up Recommendations  Follow surgeon's recommendation for DC plan and follow-up therapies;Supervision/Assistance - 24 hour (may need to consider SNF placement)     Equipment Recommendations  Rolling walker with 5" wheels    Recommendations for Other Services       Precautions / Restrictions Precautions Precautions: Fall;Knee Required Braces or Orthoses: Knee Immobilizer - Right Knee Immobilizer - Right: Discontinue once straight leg raise with < 10 degree lag Restrictions Weight Bearing Restrictions: No Other Position/Activity Restrictions: WBAT    Mobility  Bed Mobility Overal bed mobility: Needs Assistance Bed Mobility: Supine to Sit;Sit to Supine     Supine to sit: Min assist;HOB elevated Sit to supine: Min assist;HOB elevated   General bed mobility comments: Assist for R LE. Increased time.  Transfers Overall transfer level: Needs assistance Equipment used: Rolling walker (2 wheeled) Transfers: Sit to/from Stand Sit to Stand: Min assist         General transfer comment: Assist to steady. Cues for safety, hand placement.  Ambulation/Gait Ambulation/Gait assistance: Min assist Gait Distance (Feet): 70 Feet Assistive device: Rolling walker (2 wheeled) Gait Pattern/deviations: Step-to pattern;Trunk flexed     General Gait Details: VCs for safety, posture, sequence, step length, proper use of RW.  Distance limited by pain, fatigue. Intermittent assist to steady.    Stairs             Wheelchair Mobility    Modified  Rankin (Stroke Patients Only)       Balance Overall balance assessment: Needs assistance Sitting-balance support: Feet supported;No upper extremity supported Sitting balance-Leahy Scale: Good     Standing balance support: Bilateral upper extremity supported Standing balance-Leahy Scale: Poor Standing balance comment: reliant on external support                            Cognition Arousal/Alertness: Awake/alert Behavior During Therapy: WFL for tasks assessed/performed Overall Cognitive Status: Within Functional Limits for tasks assessed                                 General Comments: noted in chart review fluctuating cognition in previous dates, no deficits noted this session but will continue to assess      Exercises      General Comments        Pertinent Vitals/Pain Pain Assessment: 0-10 Pain Score: 5  Pain Location: Rt Knee Pain Descriptors / Indicators: Aching;Sore;Grimacing;Discomfort Pain Intervention(s): Limited activity within patient's tolerance;Monitored during session;Ice applied;Repositioned    Home Living Family/patient expects to be discharged to:: Private residence Living Arrangements: Alone Available Help at Discharge: Other (Comment)(pt states no one is able to help because they all work) Type of Home: Apartment(senior apartments) Home Access: Level entry   Home Layout: One Plaza: Tub bench;Cane - single point;Grab bars - tub/shower      Prior Function Level of Independence: Independent with assistive device(s)      Comments: using SPC; ind  with BADL/IADL prior. Has never driven before so gets rides to grocery store   PT Goals (current goals can now be found in the care plan section) Acute Rehab PT Goals Patient Stated Goal: go home Progress towards PT goals: Progressing toward goals    Frequency    7X/week      PT Plan Current plan remains appropriate    Co-evaluation               AM-PAC PT "6 Clicks" Mobility   Outcome Measure  Help needed turning from your back to your side while in a flat bed without using bedrails?: A Little Help needed moving from lying on your back to sitting on the side of a flat bed without using bedrails?: A Little Help needed moving to and from a bed to a chair (including a wheelchair)?: A Little Help needed standing up from a chair using your arms (e.g., wheelchair or bedside chair)?: A Little Help needed to walk in hospital room?: A Little Help needed climbing 3-5 steps with a railing? : A Lot 6 Click Score: 17    End of Session Equipment Utilized During Treatment: Gait belt;Right knee immobilizer Activity Tolerance: Patient limited by fatigue;Patient limited by pain Patient left: in bed;with call bell/phone within reach;with bed alarm set;with family/visitor present   PT Visit Diagnosis: Difficulty in walking, not elsewhere classified (R26.2);Muscle weakness (generalized) (M62.81);Other abnormalities of gait and mobility (R26.89);Unsteadiness on feet (R26.81);Pain Pain - Right/Left: Right Pain - part of body: Knee     Time: [id number] PT Time Calculation (min) (ACUTE ONLY): 18 min  Charges:  $Gait Training: 8-22 mins                       Weston Anna, PT Acute Rehabilitation Services Pager: [phone] Office: [phone]

## 2019-04-15 NOTE — Progress Notes (Signed)
     Subjective: 2 Days Post-Op Procedure(s) (LRB): RIGHT TOTAL KNEE ARTHROPLASTY (Right) Awake, alert and oriented x 4. Up to chair this AM. Past history of periop MI with the previous left TKR. No chest discomfort. Hgb stable this AM 8.8-9.1. She is less painful this AM and pain is better controlled. Requests consideration of CIR, PT has also evaluated and thinks this is reasonable as she is slow in her rehabilitation and has past cardiac history and past left TKR.  Patient reports pain as moderate.    Objective:   VITALS:  Temp:  [97.7 F (36.5 C)-99 F (37.2 C)] 99 F (37.2 C) (09/27 0452) Pulse Rate:  [70-93] 93 (09/27 0452) Resp:  [16-17] 17 (09/27 0452) BP: (116-150)/(59-78) 146/78 (09/27 0452) SpO2:  [97 %-98 %] 98 % (09/27 0452)  Neurologically intact ABD soft Neurovascular intact Sensation intact distally Dorsiflexion/Plantar flexion intact Incision: moderate drainage No cellulitis present Compartment soft   LABS Recent Labs    04/14/19 0251 04/14/19 1634 04/15/19 0234  HGB 8.7* 9.5* 8.8*  WBC 5.3 6.7 6.9  PLT 127* 130* 116*   Recent Labs    04/14/19 0251  NA 135  K 3.8  CL 100  CO2 31  BUN 7*  CREATININE 0.62  GLUCOSE 140*   No results for input(s): LABPT, INR in the last 72 hours.   Assessment/Plan: 2 Days Post-Op Procedure(s) (LRB): RIGHT TOTAL KNEE ARTHROPLASTY (Right)  Advance diet Up with therapy D/C IV fluids  Will ask for CIR consult.   Basil Dess 04/15/2019, 10:39 AMPatient ID: [name], female   DOB: Dec 14, 1931, 85 y.o.   MRN: [id number]

## 2019-04-15 NOTE — Progress Notes (Addendum)
Physical Therapy Treatment Patient Details Name: [name] MRN: [id number] DOB: 1932-07-15 Today's Date: 04/15/2019    History of Present Illness Patient is 85 y.o. female s.p Rt TKA on 04/13/19 with PMH significant for HTN, HLD, GERD, CAD, anxiety, osteoporosis, and history of MI with cardiac cath in 2013.    PT Comments    Improved alertness and cognition. Progressing slowly with mobility. Ambulation distance remains limited by pain and fatigue. Pt reported she lives alone. If she returns home, recommend 24 hour supervision/assist. If care is unavailable, may need to consider rehab placement.     Follow Up Recommendations  Follow surgeon's recommendation for DC plan and follow-up therapies;Supervision/Assistance - 24 hour(may need to consider SNF placement)     Equipment Recommendations  Rolling walker with 5" wheels    Recommendations for Other Services       Precautions / Restrictions Precautions Precautions: Fall;Knee Required Braces or Orthoses: Knee Immobilizer - Right Knee Immobilizer - Right: Discontinue once straight leg raise with < 10 degree lag Restrictions Weight Bearing Restrictions: No Other Position/Activity Restrictions: WBAT    Mobility  Bed Mobility Overal bed mobility: Needs Assistance Bed Mobility: Supine to Sit     Supine to sit: Min assist     General bed mobility comments: Assist for R LE. Increased time. Cues for technique.  Transfers Overall transfer level: Needs assistance Equipment used: Rolling walker (2 wheeled) Transfers: Sit to/from Stand Sit to Stand: Min assist         General transfer comment: Assist to rise, stabilize, control descent. Multimodal cueing required. Increased time.  Ambulation/Gait Ambulation/Gait assistance: Min assist Gait Distance (Feet): 45 Feet Assistive device: Rolling walker (2 wheeled) Gait Pattern/deviations: Step-to pattern;Trunk flexed     General Gait Details: VCs for safety,  posture, sequence, step length, proper use of RW. Slow gait speed. Distance limited by pain, fatigue.   Stairs             Wheelchair Mobility    Modified Rankin (Stroke Patients Only)       Balance Overall balance assessment: Needs assistance         Standing balance support: Bilateral upper extremity supported Standing balance-Leahy Scale: Poor                              Cognition Arousal/Alertness: Awake/alert Behavior During Therapy: WFL for tasks assessed/performed Overall Cognitive Status: Within Functional Limits for tasks assessed                                        Exercises Total Joint Exercises Ankle Circles/Pumps: AROM;Both;10 reps;Supine Quad Sets: AROM;Both;10 reps;Supine Heel Slides: AAROM;Right;10 reps;Supine Hip ABduction/ADduction: AAROM;Right;10 reps;Supine Straight Leg Raises: AAROM;Right;10 reps;Supine Goniometric ROM: ~10-60 degrees    General Comments        Pertinent Vitals/Pain Pain Assessment: 0-10 Pain Score: 6  Pain Location: Rt Knee Pain Descriptors / Indicators: Aching;Sore;Grimacing;Discomfort Pain Intervention(s): Monitored during session;Limited activity within patient's tolerance;Ice applied;Repositioned    Home Living                      Prior Function            PT Goals (current goals can now be found in the care plan section) Progress towards PT goals: Progressing toward goals    Frequency  7X/week      PT Plan Current plan remains appropriate    Co-evaluation              AM-PAC PT "6 Clicks" Mobility   Outcome Measure  Help needed turning from your back to your side while in a flat bed without using bedrails?: A Little Help needed moving from lying on your back to sitting on the side of a flat bed without using bedrails?: A Little Help needed moving to and from a bed to a chair (including a wheelchair)?: A Little Help needed standing up from a  chair using your arms (e.g., wheelchair or bedside chair)?: A Little Help needed to walk in hospital room?: A Little Help needed climbing 3-5 steps with a railing? : A Lot 6 Click Score: 17    End of Session Equipment Utilized During Treatment: Gait belt;Right knee immobilizer Activity Tolerance: Patient limited by fatigue;Patient limited by pain Patient left: in chair;with call bell/phone within reach;with chair alarm set   PT Visit Diagnosis: Difficulty in walking, not elsewhere classified (R26.2);Muscle weakness (generalized) (M62.81);Other abnormalities of gait and mobility (R26.89);Unsteadiness on feet (R26.81);Pain Pain - Right/Left: Right Pain - part of body: Knee     Time: 0912-0931 PT Time Calculation (min) (ACUTE ONLY): 19 min  Charges:  $Gait Training: 8-22 mins                        Weston Anna, [place] Pager: [phone] Office: [phone]

## 2019-04-15 NOTE — Progress Notes (Signed)
Spoke with Dr. Louanne Skye RE: pt unable to void. bladder scan = >982ml. Order for in and out cath every 4-6 prn received.  In and out complete via sterile technique. 1142ml obtained. Pt tolerated well.

## 2019-04-15 NOTE — Evaluation (Signed)
Occupational Therapy Evaluation Patient Details Name: [name] MRN: [id number] DOB: 1931-07-24 Today's Date: 04/15/2019    History of Present Illness Patient is 85 y.o. female s.p Rt TKA on 04/13/19 with PMH significant for HTN, HLD, GERD, CAD, anxiety, osteoporosis, and history of MI with cardiac cath in 2013.   Clinical Impression   Pt admitted with above diagnoses, with post surgical pain and decreased activity tolerance limiting ability to engage in BADL at desired level of ind. PTA pt living alone in senior apartment, ind with BADL and slight assist for IADL management. She shares she has little support physically from family members at d/c. At time of eval, pt is min A- min guard for transfers and functional mobility with RW. She fatigues easily with activity. Pt stood to brush teeth and practiced toilet transfer. She currently requires max A for LB ADLs at this time 2/2 post surgical pain. At this time think pt is appropriate for SNF d/c prior to returning home alone. Will continue to follow per POC listed below.     Follow Up Recommendations  Follow surgeon's recommendation for DC plan and follow-up therapies;SNF;Supervision/Assistance - 24 hour    Equipment Recommendations  Other (comment)(TBD given dispo)    Recommendations for Other Services       Precautions / Restrictions Precautions Precautions: Fall;Knee Required Braces or Orthoses: Knee Immobilizer - Right Knee Immobilizer - Right: Discontinue once straight leg raise with < 10 degree lag Restrictions Weight Bearing Restrictions: No Other Position/Activity Restrictions: WBAT      Mobility Bed Mobility Overal bed mobility: Needs Assistance Bed Mobility: Supine to Sit     Supine to sit: Min assist     General bed mobility comments: up in chair  Transfers Overall transfer level: Needs assistance Equipment used: Rolling walker (2 wheeled) Transfers: Sit to/from Stand Sit to Stand: Min assist;Min  guard         General transfer comment: assist for steadying, increased time with multimodal cueing    Balance Overall balance assessment: Needs assistance Sitting-balance support: Feet supported;No upper extremity supported Sitting balance-Leahy Scale: Good     Standing balance support: Bilateral upper extremity supported Standing balance-Leahy Scale: Poor Standing balance comment: reliant on external support                           ADL either performed or assessed with clinical judgement   ADL Overall ADL's : Needs assistance/impaired Eating/Feeding: Set up;Sitting   Grooming: Min guard;Standing;Oral care   Upper Body Bathing: Minimal assistance;Sitting   Lower Body Bathing: Maximal assistance;Sit to/from stand;Sitting/lateral leans   Upper Body Dressing : Minimal assistance;Sitting   Lower Body Dressing: Maximal assistance;Sitting/lateral leans;Sit to/from stand   Toilet Transfer: Min guard;Grab Geophysical data processor Details (indicate cue type and reason): required use of grab bars and BSC to push up at min guard level Toileting- Clothing Manipulation and Hygiene: Min guard;Sitting/lateral lean;Sit to/from stand Toileting - Clothing Manipulation Details (indicate cue type and reason): increased time Tub/ Shower Transfer: Minimal assistance;Shower seat;Ambulation;Grab bars;Rolling walker   Functional mobility during ADLs: Min guard;Rolling walker General ADL Comments: pt ltd 2/2 post surgical pain and decreased activity tolerance     Vision Patient Visual Report: No change from baseline       Perception     Praxis      Pertinent Vitals/Pain Pain Assessment: 0-10 Pain Score: 4  Pain Location: Rt Knee Pain Descriptors / Indicators: Aching;Sore;Grimacing;Discomfort Pain Intervention(s): Limited activity  within patient's tolerance;Monitored during session;Repositioned;Ice applied     Hand Dominance Right   Extremity/Trunk Assessment Upper  Extremity Assessment Upper Extremity Assessment: Generalized weakness   Lower Extremity Assessment Lower Extremity Assessment: Defer to PT evaluation       Communication Communication Communication: No difficulties   Cognition Arousal/Alertness: Awake/alert Behavior During Therapy: WFL for tasks assessed/performed Overall Cognitive Status: Within Functional Limits for tasks assessed                                 General Comments: noted in chart review fluctuating cognition in previous dates, no deficits noted this session but will continue to assess   General Comments       Exercises Total Joint Exercises Ankle Circles/Pumps: AROM;Both;10 reps;Supine Quad Sets: AROM;Both;10 reps;Supine Heel Slides: AAROM;Right;10 reps;Supine Hip ABduction/ADduction: AAROM;Right;10 reps;Supine Straight Leg Raises: AAROM;Right;10 reps;Supine Goniometric ROM: ~10-60 degrees   Shoulder Instructions      Home Living Family/patient expects to be discharged to:: Private residence Living Arrangements: Alone Available Help at Discharge: Other (Comment)(pt states no one is able to help because they all work) Type of Home: Apartment(senior apartments) Home Access: Level entry     Home Layout: One level     Bathroom Shower/Tub: Teacher, early years/pre: Standard Bathroom Accessibility: Yes   Home Equipment: Tub bench;Cane - single point;Grab bars - tub/shower          Prior Functioning/Environment Level of Independence: Independent with assistive device(s)        Comments: using SPC; ind with BADL/IADL prior. Has never driven before so gets rides to grocery store        OT Problem List: Decreased strength;Decreased knowledge of use of DME or AE;Decreased knowledge of precautions;Decreased activity tolerance;Impaired balance (sitting and/or standing);Pain      OT Treatment/Interventions: Self-care/ADL training;Therapeutic exercise;Patient/family  education;Balance training;Energy conservation;Therapeutic activities;DME and/or AE instruction    OT Goals(Current goals can be found in the care plan section) Acute Rehab OT Goals Patient Stated Goal: go home OT Goal Formulation: With patient Time For Goal Achievement: 04/29/19 Potential to Achieve Goals: Good  OT Frequency:     Barriers to D/C:    lives alone       Co-evaluation              AM-PAC OT "6 Clicks" Daily Activity     Outcome Measure Help from another person eating meals?: None Help from another person taking care of personal grooming?: A Little Help from another person toileting, which includes using toliet, bedpan, or urinal?: A Little Help from another person bathing (including washing, rinsing, drying)?: A Lot Help from another person to put on and taking off regular upper body clothing?: None Help from another person to put on and taking off regular lower body clothing?: A Lot 6 Click Score: 18   End of Session Equipment Utilized During Treatment: Gait belt;Rolling walker Nurse Communication: Mobility status  Activity Tolerance: Patient tolerated treatment well Patient left: in chair;with call bell/phone within reach;with chair alarm set  OT Visit Diagnosis: Other abnormalities of gait and mobility (R26.89);Unsteadiness on feet (R26.81);Muscle weakness (generalized) (M62.81);Pain Pain - Right/Left: Right Pain - part of body: Knee;Leg                Time: 0938-1000 OT Time Calculation (min): 22 min Charges:  OT General Charges $OT Visit: 1 Visit OT Evaluation $OT Eval Low Complexity: 1 Low OT Treatments $  Self Care/Home Management : 8-22 mins  Zenovia Jarred, MSOT, OTR/L Behavioral Health OT/ Acute Relief OT [facility] Office: [place] 04/15/2019, 12:21 PM

## 2019-04-16 ENCOUNTER — Encounter (HOSPITAL_COMMUNITY): Payer: Self-pay | Admitting: Orthopaedic Surgery

## 2019-04-16 LAB — CBC WITH DIFFERENTIAL/PLATELET
Abs Immature Granulocytes: 0.05 10*3/uL (ref 0.00–0.07)
Basophils Absolute: 0 10*3/uL (ref 0.0–0.1)
Basophils Relative: 0 %
Eosinophils Absolute: 0.1 10*3/uL (ref 0.0–0.5)
Eosinophils Relative: 1 %
HCT: 27.8 % — ABNORMAL LOW (ref 36.0–46.0)
Hemoglobin: 9 g/dL — ABNORMAL LOW (ref 12.0–15.0)
Immature Granulocytes: 1 %
Lymphocytes Relative: 13 %
Lymphs Abs: 0.9 10*3/uL (ref 0.7–4.0)
MCH: 24.3 pg — ABNORMAL LOW (ref 26.0–34.0)
MCHC: 32.4 g/dL (ref 30.0–36.0)
MCV: 75.1 fL — ABNORMAL LOW (ref 80.0–100.0)
Monocytes Absolute: 0.7 10*3/uL (ref 0.1–1.0)
Monocytes Relative: 10 %
Neutro Abs: 5.1 10*3/uL (ref 1.7–7.7)
Neutrophils Relative %: 75 %
Platelets: 141 10*3/uL — ABNORMAL LOW (ref 150–400)
RBC: 3.7 MIL/uL — ABNORMAL LOW (ref 3.87–5.11)
RDW: 15.2 % (ref 11.5–15.5)
WBC: 6.8 10*3/uL (ref 4.0–10.5)
nRBC: 0 % (ref 0.0–0.2)

## 2019-04-16 LAB — BASIC METABOLIC PANEL
Anion gap: 13 (ref 5–15)
BUN: 7 mg/dL — ABNORMAL LOW (ref 8–23)
CO2: 22 mmol/L (ref 22–32)
Calcium: 8.8 mg/dL — ABNORMAL LOW (ref 8.9–10.3)
Chloride: 92 mmol/L — ABNORMAL LOW (ref 98–111)
Creatinine, Ser: 0.59 mg/dL (ref 0.44–1.00)
GFR calc Af Amer: >60 mL/min (ref >60)
GFR calc non Af Amer: >60 mL/min (ref >60)
Glucose, Bld: 114 mg/dL — ABNORMAL HIGH (ref 70–99)
Potassium: 3.9 mmol/L (ref 3.5–5.1)
Sodium: 127 mmol/L — ABNORMAL LOW (ref 135–145)

## 2019-04-16 MED ORDER — TRAMADOL HCL 50 MG PO TABS
50.0000 mg | ORAL_TABLET | Freq: Four times a day (QID) | ORAL | Status: DC | PRN
  Administered 2019-04-17 – 2019-04-19 (×3): 50 mg via ORAL
  Filled 2019-04-16 (×3): qty 1

## 2019-04-16 MED ORDER — TAMSULOSIN HCL 0.4 MG PO CAPS
0.4000 mg | ORAL_CAPSULE | Freq: Every day | ORAL | Status: DC
  Administered 2019-04-16 – 2019-04-19 (×4): 0.4 mg via ORAL
  Filled 2019-04-16 (×4): qty 1

## 2019-04-16 MED ORDER — BISACODYL 10 MG RE SUPP
10.0000 mg | Freq: Every day | RECTAL | Status: DC | PRN
  Administered 2019-04-18: 10 mg via RECTAL
  Filled 2019-04-16: qty 1

## 2019-04-16 NOTE — Progress Notes (Addendum)
Physical Therapy Treatment Patient Details Name: [name] MRN: [id number] DOB: [birth year] Today's Date: 04/16/2019    History of Present Illness 85 y.o. female s.p Rt TKA on 04/13/19 with PMH significant for HTN, HLD, GERD, CAD, anxiety, osteoporosis, and history of MI with cardiac cath in 2013.    PT Comments    Progressing with mobility. Continue to recommend SNF.    Follow Up Recommendations  Follow surgeon's recommendation for DC plan and follow-up therapies;Supervision/Assistance - 24 hour     Equipment Recommendations  Rolling walker with 5" wheels    Recommendations for Other Services       Precautions / Restrictions Precautions Precautions: Fall;Knee Required Braces or Orthoses: Knee Immobilizer - Right Knee Immobilizer - Right: Discontinue once straight leg raise with < 10 degree lag Restrictions Weight Bearing Restrictions: No Other Position/Activity Restrictions: WBAT    Mobility  Bed Mobility Overal bed mobility: Needs Assistance Bed Mobility: Supine to Sit;Sit to Supine     Supine to sit: Min assist Sit to supine: Min assist   General bed mobility comments: Assist for R LE. Cues for safety.  Transfers Overall transfer level: Needs assistance Equipment used: Rolling walker (2 wheeled) Transfers: Sit to/from Stand Sit to Stand: Min assist        General transfer comment: Assist to steady. Cues for safety, hand placement.  Ambulation/Gait Ambulation/Gait assistance: Min assist Gait Distance (Feet): 75 Feet Assistive device: Rolling walker (2 wheeled) Gait Pattern/deviations: Trunk flexed;Step-to pattern;Step-through pattern;Decreased stride length     General Gait Details: VCs for safety, posture, sequence, step length, proper use of RW. Marland Kitchen Distance limited by pain, fatigue. Assist to steady pt and manage RW intermittently.   Stairs             Wheelchair Mobility    Modified Rankin (Stroke Patients Only)        Balance Overall balance assessment: Needs assistance         Standing balance support: Bilateral upper extremity supported Standing balance-Leahy Scale: Poor                              Cognition Arousal/Alertness: Awake/alert Behavior During Therapy: WFL for tasks assessed/performed Overall Cognitive Status: Within Functional Limits for tasks assessed                                        Exercises      General Comments        Pertinent Vitals/Pain Pain Assessment: 0-10 Pain Score: 5  Pain Location: Rt Knee Pain Descriptors / Indicators: Sore;Discomfort Pain Intervention(s): Monitored during session;Limited activity within patient's tolerance;Repositioned    Home Living                      Prior Function            PT Goals (current goals can now be found in the care plan section) Progress towards PT goals: Progressing toward goals    Frequency    7X/week      PT Plan Current plan remains appropriate    Co-evaluation              AM-PAC PT "6 Clicks" Mobility   Outcome Measure  Help needed turning from your back to your side while in a flat bed without using bedrails?: A Little Help  needed moving from lying on your back to sitting on the side of a flat bed without using bedrails?: A Little Help needed moving to and from a bed to a chair (including a wheelchair)?: A Little Help needed standing up from a chair using your arms (e.g., wheelchair or bedside chair)?: A Little Help needed to walk in hospital room?: A Little Help needed climbing 3-5 steps with a railing? : A Lot 6 Click Score: 17    End of Session Equipment Utilized During Treatment: Gait belt;Right knee immobilizer Activity Tolerance: Patient tolerated treatment well Patient left: in bed;with call bell/phone within reach;with bed alarm set   PT Visit Diagnosis: Difficulty in walking, not elsewhere classified (R26.2);Muscle weakness  (generalized) (M62.81);Other abnormalities of gait and mobility (R26.89);Unsteadiness on feet (R26.81);Pain Pain - Right/Left: Right Pain - part of body: Knee     Time: [id number] PT Time Calculation (min) (ACUTE ONLY): 12 min  Charges:  $Gait Training: 8-22 mins                        Weston Anna, [place] Pager: [phone] Office: [phone]

## 2019-04-16 NOTE — Progress Notes (Signed)
Inpatient Rehabilitation Admissions Coordinator  Inpatient rehab consult received. Reviewed chart for rehab assessment. Patient lacks the medical neccesity for an inpt rehab admit at this time. After first TKR in 2002 she admitted to CIR due to TKR and perioperative MI. Doing well medically after this surgery. I contacted RN CM, Beverlee Nims, and we will sign off at this time. Recommend SNF rehab.  Danne Baxter, RN, MSN Rehab Admissions Coordinator [phone] 04/16/2019 8:48 AM

## 2019-04-16 NOTE — Progress Notes (Signed)
Physical Therapy Treatment Patient Details Name: [name] MRN: [id number] DOB: 1932/06/27 Today's Date: 04/16/2019    History of Present Illness 85 y.o. female s.p Rt TKA on 04/13/19 with PMH significant for HTN, HLD, GERD, CAD, anxiety, osteoporosis, and history of MI with cardiac cath in 2013.    PT Comments    Pt continues to progress with therapy. She reports having discomfort from not having a BM. Will continue to follow and progress activity as tolerated.    Follow Up Recommendations  Follow surgeon's recommendation for DC plan and follow-up therapies;Supervision/Assistance - 24 hour     Equipment Recommendations  Rolling walker with 5" wheels    Recommendations for Other Services       Precautions / Restrictions Precautions Precautions: Fall;Knee Required Braces or Orthoses: Knee Immobilizer - Right Knee Immobilizer - Right: Discontinue once straight leg raise with < 10 degree lag Restrictions Weight Bearing Restrictions: No Other Position/Activity Restrictions: WBAT    Mobility  Bed Mobility Overal bed mobility: Needs Assistance Bed Mobility: Supine to Sit     Supine to sit: Min assist     General bed mobility comments: small amount of assist for R LE.  Transfers Overall transfer level: Needs assistance Equipment used: Rolling walker (2 wheeled) Transfers: Sit to/from Stand Sit to Stand: Min assist         General transfer comment: Assist to steady. Cues for safety, hand placement.  Ambulation/Gait Ambulation/Gait assistance: Min assist Gait Distance (Feet): 120 Feet Assistive device: Rolling walker (2 wheeled) Gait Pattern/deviations: Trunk flexed;Step-to pattern;Step-through pattern;Decreased stride length     General Gait Details: VCs for safety, posture, sequence, step length, proper use of RW. Marland Kitchen Distance limited by pain, fatigue. Assist to steady pt and manage RW intermittently.   Stairs             Wheelchair  Mobility    Modified Rankin (Stroke Patients Only)       Balance Overall balance assessment: Needs assistance         Standing balance support: Bilateral upper extremity supported Standing balance-Leahy Scale: Poor                              Cognition Arousal/Alertness: Awake/alert Behavior During Therapy: WFL for tasks assessed/performed Overall Cognitive Status: Within Functional Limits for tasks assessed                                        Exercises Total Joint Exercises Ankle Circles/Pumps: AROM;Both;10 reps;Seated Quad Sets: AROM;Both;10 reps;Seated Heel Slides: AAROM;Right;10 reps;Supine Hip ABduction/ADduction: AAROM;Right;10 reps;Supine Straight Leg Raises: AAROM;Right;10 reps;Supine Goniometric ROM: ~10-60 degrees    General Comments        Pertinent Vitals/Pain Pain Assessment: 0-10 Pain Score: 5  Pain Location: Rt Knee Pain Descriptors / Indicators: Aching;Sore Pain Intervention(s): Monitored during session;Ice applied;Repositioned    Home Living                      Prior Function            PT Goals (current goals can now be found in the care plan section) Progress towards PT goals: Progressing toward goals    Frequency    7X/week      PT Plan Current plan remains appropriate    Co-evaluation  AM-PAC PT "6 Clicks" Mobility   Outcome Measure  Help needed turning from your back to your side while in a flat bed without using bedrails?: A Little Help needed moving from lying on your back to sitting on the side of a flat bed without using bedrails?: A Little Help needed moving to and from a bed to a chair (including a wheelchair)?: A Little Help needed standing up from a chair using your arms (e.g., wheelchair or bedside chair)?: A Little Help needed to walk in hospital room?: A Little Help needed climbing 3-5 steps with a railing? : A Lot 6 Click Score: 17    End of  Session Equipment Utilized During Treatment: Gait belt;Right knee immobilizer Activity Tolerance: Patient tolerated treatment well Patient left: in chair;with call bell/phone within reach;with chair alarm set   PT Visit Diagnosis: Difficulty in walking, not elsewhere classified (R26.2);Muscle weakness (generalized) (M62.81);Other abnormalities of gait and mobility (R26.89);Unsteadiness on feet (R26.81);Pain Pain - Right/Left: Right Pain - part of body: Knee     Time: [id number] PT Time Calculation (min) (ACUTE ONLY): 20 min  Charges:  $Gait Training: 8-22 mins                        Weston Anna, PT Acute Rehabilitation Services Pager: [phone] Office: [phone]

## 2019-04-16 NOTE — Progress Notes (Signed)
Patient ID: [name], female   DOB: 03-25-1932, 85 y.o.   MRN: [id number] Having issues with urinary retention as well as constipation.  Right knee otherwise stable.  Hbg/Hct stable.  Creatinine stable.  Can not go home for safety purposes.  Needs short-term skilled nursing vs CIR.  Will order flomax and dulcolax supp for today and still have the patient mobilize as much as possible.  Anticipate the earliest for discharge would be tomorrow.

## 2019-04-16 NOTE — Progress Notes (Signed)
Occupational Therapy Treatment Patient Details Name: [name] MRN: [id number] DOB: 1931/09/10 Today's Date: 04/16/2019    History of present illness 84 y.o. female s.p Rt TKA on 04/13/19 with PMH significant for HTN, HLD, GERD, CAD, anxiety, osteoporosis, and history of MI with cardiac cath in 2013.   OT comments  Pt overall min A with ADL activity at this time.  DC plan will depend on A at home.  Pt with good participation  Follow Up Recommendations  Follow surgeon's recommendation for DC plan and follow-up therapies;SNF;Supervision/Assistance - 24 hour    Equipment Recommendations  Other (comment)(TBD given dispo)    Recommendations for Other Services      Precautions / Restrictions Precautions Precautions: Fall;Knee Required Braces or Orthoses: Knee Immobilizer - Right Knee Immobilizer - Right: Discontinue once straight leg raise with < 10 degree lag Restrictions Weight Bearing Restrictions: No Other Position/Activity Restrictions: WBAT       Mobility Bed Mobility Overal bed mobility: Needs Assistance Bed Mobility: Supine to Sit     Supine to sit: Min assist     General bed mobility comments: pt OOB  Transfers Overall transfer level: Needs assistance Equipment used: Rolling walker (2 wheeled) Transfers: Sit to/from [facility] Sit to Stand: Min guard Stand pivot transfers: Min assist       General transfer comment: Assist to steady. Cues for safety, hand placement.    Balance Overall balance assessment: Needs assistance         Standing balance support: Bilateral upper extremity supported Standing balance-Leahy Scale: Poor                             ADL either performed or assessed with clinical judgement   ADL Overall ADL's : Needs assistance/impaired Eating/Feeding: Set up;Sitting   Grooming: Set up;Sitting   Upper Body Bathing: Set up;Sitting   Lower Body Bathing: Moderate assistance;Sit to/from  stand;Cueing for safety;Cueing for compensatory techniques   Upper Body Dressing : Set up;Sitting   Lower Body Dressing: Moderate assistance;Sit to/from stand;Cueing for safety;Cueing for sequencing;Cueing for compensatory techniques   Toilet Transfer: BSC;Minimal assistance;Cueing for sequencing;Stand-pivot;Cueing for safety;RW Toilet Transfer Details (indicate cue type and reason): VC for safety Toileting- Clothing Manipulation and Hygiene: Minimal assistance;Sit to/from stand;Cueing for safety;Cueing for compensatory techniques;Cueing for sequencing         General ADL Comments: pt did transfer to [facility] with min A.  Pt did pass gas after stating her stomach was in pain.  Encouraged pt to sit on [facility] but pt wanted to get back to chair     Vision Patient Visual Report: No change from baseline            Cognition Arousal/Alertness: Awake/alert Behavior During Therapy: WFL for tasks assessed/performed Overall Cognitive Status: Within Functional Limits for tasks assessed                                                General Comments      Pertinent Vitals/ Pain       Pain Assessment: 0-10 Pain Score: 3  Pain Location: Rt Knee Pain Descriptors / Indicators: Tightness Pain Intervention(s): Limited activity within patient's tolerance;Monitored during session         Frequency  Min 2X/week        Progress Toward Goals  OT Goals(current goals can now be found in the care plan section)  Progress towards OT goals: Progressing toward goals     Plan Discharge plan remains appropriate       AM-PAC OT "6 Clicks" Daily Activity     Outcome Measure   Help from another person eating meals?: None Help from another person taking care of personal grooming?: A Little Help from another person toileting, which includes using toliet, bedpan, or urinal?: A Little Help from another person bathing (including washing, rinsing, drying)?: A Little Help from another  person to put on and taking off regular upper body clothing?: None Help from another person to put on and taking off regular lower body clothing?: A Little 6 Click Score: 20    End of Session Equipment Utilized During Treatment: Gait belt;Rolling walker  OT Visit Diagnosis: Other abnormalities of gait and mobility (R26.89);Unsteadiness on feet (R26.81);Muscle weakness (generalized) (M62.81);Pain Pain - Right/Left: Right Pain - part of body: Leg   Activity Tolerance Patient tolerated treatment well   Patient Left in chair;with call bell/phone within reach;with chair alarm set   Nurse Communication Mobility status        Time: [id number] OT Time Calculation (min): 25 min  Charges: OT General Charges $OT Visit: 1 Visit OT Treatments $Self Care/Home Management : 23-37 mins  Kari Baars, [place] [phone] Office- [phone]      Glendia Olshefski, Edwena Felty D 04/16/2019, 12:05 PM

## 2019-04-17 MED ORDER — HYDROCODONE-ACETAMINOPHEN 5-325 MG PO TABS: 1.0000 | ORAL_TABLET | ORAL | 0 refills | Status: DC | PRN

## 2019-04-17 NOTE — Progress Notes (Signed)
Patient ID: [name], female   DOB: Aug 22, 1931, 85 y.o.   MRN: [id number] No acute changes.  Patient did not qualify for CIR.  Short-term skilled nursing has still been recommended by therapy.  The patient does not have support at home during the daytime.  She is stable medically and can be discharged to skilled nursing.  There are no notes from Social Work yet and no FL-2. The patient can be discharged once a disposition has been made.

## 2019-04-17 NOTE — Discharge Summary (Signed)
Patient ID: [name] MRN: [id number] DOB/AGE: 12-21-1931 85 y.o.  Admit date: 04/13/2019 Discharge date: 04/17/2019  Admission Diagnoses:  Principal Problem:   Unilateral primary osteoarthritis, right knee Active Problems:   Status post total right knee replacement   Anemia due to acute blood loss   Discharge Diagnoses:  Same  Past Medical History:  Diagnosis Date  . Anemia 06/02/2012  . Anginal pain ([place])    complained of chest pain last week lasting momentarily week of 04/02/2019 took reflux medication, then took a nitro and laid down and got relief  . Anxiety   . Arthritis   . CAD (coronary artery disease)    a. 2005 small inferoapical MI --> medical therapy b. 2007 - nomral coronaries c. 11/2015 mid to distal LAD 20% stenosis, 1st diag 30%, and 50%,--> medical therapy    . Cataracts, bilateral 09/11/2011   Per report of pt.  . Esophageal abnormality 09/11/2011   Surgically treated per pt.  Marland Kitchen GERD (gastroesophageal reflux disease)   . Headache(784.0)   . Hypercholesterolemia 09/11/2011  . Hyperlipemia   . Hypertension   . Hypothyroid 09/11/2011  . Hypothyroidism   . Insect bite of ankle, left 09/11/2011   back of ankle  . Kidney mass 09/11/2011   Per report of pt  . Leukocytopenia   . Liver mass 09/11/2011   Per report of pt  . Mental health disorder   . Myocardial infarction ([place]) 09/11/2011   Per pt in 2005 (small vessel)  . Osteoporosis 09/11/2011  . Pneumonia   . Thigh cramp 09/11/2011   Left thigh x 3 nights since starting unspecified psychotropic medication    Surgeries: Procedure(s): RIGHT TOTAL KNEE ARTHROPLASTY on 04/13/2019   Consultants:   Discharged Condition: Improved  Hospital Course: [name] is an 85 y.o. female who was admitted 04/13/2019 for operative treatment ofUnilateral primary osteoarthritis, right knee. Patient has severe unremitting pain that affects sleep, daily activities, and work/hobbies. After pre-op clearance  the patient was taken to the operating room on 04/13/2019 and underwent  Procedure(s): RIGHT TOTAL KNEE ARTHROPLASTY.    Patient was given perioperative antibiotics:  Anti-infectives (From admission, onward)   Start     Dose/Rate Route Frequency Ordered Stop   04/13/19 1700  ceFAZolin (ANCEF) IVPB 1 g/50 mL premix     1 g 100 mL/hr over 30 Minutes Intravenous Every 6 hours 04/13/19 1539 04/13/19 2341   04/13/19 0845  ceFAZolin (ANCEF) IVPB 2g/100 mL premix     2 g 200 mL/hr over 30 Minutes Intravenous On call to O.R. 04/13/19 [id number] 04/13/19 1142       Patient was given sequential compression devices, early ambulation, and chemoprophylaxis to prevent DVT.  Patient benefited maximally from hospital stay and there were no complications.    Recent vital signs:  Patient Vitals for the past 24 hrs:  BP Temp Temp src Pulse Resp SpO2  04/17/19 0530 134/67 99.2 F (37.3 C) Oral 87 17 94 %  04/16/19 2127 (!) 108/57 99.6 F (37.6 C) Oral 85 18 93 %  04/16/19 1312 131/70 98.1 F (36.7 C) Oral (!) 104 17 98 %     Recent laboratory studies:  Recent Labs    04/15/19 0234 04/16/19 0308  WBC 6.9 6.8  HGB 8.8* 9.0*  HCT 28.3* 27.8*  PLT 116* 141*  NA  --  127*  K  --  3.9  CL  --  92*  CO2  --  22  BUN  --  7*  CREATININE  --  0.59  GLUCOSE  --  114*  CALCIUM  --  8.8*     Discharge Medications:   Allergies as of 04/17/2019      Reactions   Statins Other (See Comments)   Muscle aches   Cholestatin    Rosuvastatin Other (See Comments)   Doxycycline Itching, Rash   Prednisone Itching      Medication List    STOP taking these medications   aspirin EC 81 MG tablet Replaced by: aspirin 81 MG chewable tablet     TAKE these medications   acetaminophen 500 MG tablet Commonly known as: TYLENOL Take 1,000 mg by mouth every 6 (six) hours as needed for moderate pain.   Align 4 MG Caps Take 4 mg by mouth daily.   amLODipine 5 MG tablet Commonly known as: NORVASC Take 5 mg  by mouth daily.   aspirin 81 MG chewable tablet Chew 1 tablet (81 mg total) by mouth 2 (two) times daily. Replaces: aspirin EC 81 MG tablet   Azelastine HCl 0.15 % Soln USE 1 SPRAY IN EACH NOSTRIL UP TO TWO TIMES DAILY AS  NEEDED   Cholecalciferol 250 MCG (10000 UT) Tabs Take 1,000 Units by mouth daily.   cloNIDine 0.1 MG tablet Commonly known as: CATAPRES Take 0.1 mg by mouth daily.   docusate sodium 100 MG capsule Commonly known as: COLACE Take 100-200 mg by mouth daily as needed for moderate constipation.   famotidine 10 MG tablet Commonly known as: PEPCID Take 10 mg by mouth daily.   hydrochlorothiazide 12.5 MG tablet Commonly known as: HYDRODIURIL Take 12.5 mg by mouth daily.   HYDROcodone-acetaminophen 5-325 MG tablet Commonly known as: NORCO/VICODIN Take 1-2 tablets by mouth every 4 (four) hours as needed for moderate pain (pain score 4-6).   levothyroxine 112 MCG tablet Commonly known as: SYNTHROID Take 112 mcg by mouth daily before breakfast.   Linzess 145 MCG Caps capsule Generic drug: linaclotide Take 145 mcg by mouth daily.   loratadine 10 MG tablet Commonly known as: CLARITIN Take 10 mg by mouth daily.   multivitamin with minerals Tabs tablet Take 1 tablet by mouth daily.   nitroGLYCERIN 0.4 MG SL tablet Commonly known as: NITROSTAT Place 1 tablet (0.4 mg total) under the tongue every 5 (five) minutes x 3 doses as needed for chest pain.   OLANZapine 2.5 MG tablet Commonly known as: ZYPREXA Take 2.5 mg by mouth at bedtime.   olmesartan 40 MG tablet Commonly known as: BENICAR Take 40 mg by mouth daily.   polyethylene glycol 17 g packet Commonly known as: MiraLax Take 17 g by mouth 2 (two) times daily.   potassium chloride SA 20 MEQ tablet Commonly known as: K-DUR Take 20 mEq by mouth daily.   simvastatin 10 MG tablet Commonly known as: ZOCOR Take 5 mg by mouth daily.   traZODone 150 MG tablet Commonly known as: DESYREL Take 225 mg by  mouth at bedtime.   traZODone 100 MG tablet Commonly known as: DESYREL Take 2 tablets (200 mg total) by mouth at bedtime.   vitamin C 500 MG tablet Commonly known as: ASCORBIC ACID Take 500 mg by mouth daily.            Durable Medical Equipment  (From admission, onward)         Start     Ordered   04/13/19 1540  DME 3 n 1  Once     04/13/19 1539  04/13/19 1540  DME Walker rolling  Once    Question:  Patient needs a walker to treat with the following condition  Answer:  Status post total right knee replacement   04/13/19 1539          Diagnostic Studies: Dg Knee Right Port  Result Date: 04/13/2019 CLINICAL DATA:  Status post right knee replacement. EXAM: PORTABLE RIGHT KNEE - 1-2 VIEW COMPARISON:  September 30th 2019 FINDINGS: The patient is status post knee replacement. Hardware is in good position. Skin staples are noted. IMPRESSION: Right knee replacement as above. Electronically Signed   By: Dorise Bullion III M.D   On: 04/13/2019 13:44    Disposition: Discharge disposition: 03-[facility]illed [facility] Follow up.   Specialty: Home Health Services Why: agency will provide home health physical therapy. agency will call you to schedule first visit. Contact information: [place] [address] [phone]        Mcarthur Rossetti, MD Follow up in 2 week(s).   Specialty: Orthopedic Surgery Contact information: [place] [address] [phone]            Signed: Mcarthur Rossetti 04/17/2019, 7:27 AM

## 2019-04-17 NOTE — Progress Notes (Signed)
Physical Therapy Treatment Patient Details Name: [name] MRN: [id number] DOB: 1932-01-01 Today's Date: 04/17/2019    History of Present Illness 85 y.o. female s.p Rt TKA on 04/13/19 with PMH significant for HTN, HLD, GERD, CAD, anxiety, osteoporosis, and history of MI with cardiac cath in 2013.    PT Comments    Pt continues to participate well. Plan is for SNF possibly tomorrow.    Follow Up Recommendations  Follow surgeon's recommendation for DC plan and follow-up therapies;Supervision/Assistance - 24 hour     Equipment Recommendations  Rolling walker with 5" wheels    Recommendations for Other Services       Precautions / Restrictions Precautions Precautions: Fall;Knee Required Braces or Orthoses: Knee Immobilizer - Right Knee Immobilizer - Right: Discontinue once straight leg raise with < 10 degree lag Restrictions Weight Bearing Restrictions: No RLE Weight Bearing: Weight bearing as tolerated    Mobility  Bed Mobility Overal bed mobility: Needs Assistance Bed Mobility: Supine to Sit;Sit to Supine     Supine to sit: Supervision;HOB elevated Sit to supine: Supervision;HOB elevated   General bed mobility comments: for safety.  Transfers Overall transfer level: Needs assistance Equipment used: Rolling walker (2 wheeled) Transfers: Sit to/from Stand Sit to Stand: Min assist         General transfer comment: for safety. VCs safety, hand placement. LOB posteriorly x 1 when trying to statically stand and tie gown  Ambulation/Gait Ambulation/Gait assistance: Min guard;Min assist Gait Distance (Feet): 125 Feet Assistive device: Rolling walker (2 wheeled) Gait Pattern/deviations: Trunk flexed;Step-to pattern;Step-through pattern;Decreased stride length     General Gait Details: VCs for safety, posture, sequence, step length, proper use of RW. Assist to steady pt and manage RW intermittently.   Stairs             Wheelchair Mobility     Modified Rankin (Stroke Patients Only)       Balance Overall balance assessment: Needs assistance         Standing balance support: Bilateral upper extremity supported Standing balance-Leahy Scale: Poor                              Cognition Arousal/Alertness: Awake/alert Behavior During Therapy: WFL for tasks assessed/performed Overall Cognitive Status: Within Functional Limits for tasks assessed                                        Exercises      General Comments        Pertinent Vitals/Pain Pain Assessment: 0-10 Pain Score: 5  Pain Location: R knee "feels like its catching" Pain Descriptors / Indicators: Sore;Discomfort Pain Intervention(s): Monitored during session;Repositioned    Home Living                      Prior Function            PT Goals (current goals can now be found in the care plan section) Progress towards PT goals: Progressing toward goals    Frequency    7X/week      PT Plan Current plan remains appropriate    Co-evaluation              AM-PAC PT "6 Clicks" Mobility   Outcome Measure  Help needed turning from your back to your side while in a  flat bed without using bedrails?: A Little Help needed moving from lying on your back to sitting on the side of a flat bed without using bedrails?: A Little Help needed moving to and from a bed to a chair (including a wheelchair)?: A Little Help needed standing up from a chair using your arms (e.g., wheelchair or bedside chair)?: A Little Help needed to walk in hospital room?: A Little Help needed climbing 3-5 steps with a railing? : A Little 6 Click Score: 18    End of Session Equipment Utilized During Treatment: Gait belt;Right knee immobilizer Activity Tolerance: Patient tolerated treatment well Patient left: in bed;with call bell/phone within reach   PT Visit Diagnosis: Difficulty in walking, not elsewhere classified (R26.2);Muscle  weakness (generalized) (M62.81);Other abnormalities of gait and mobility (R26.89);Unsteadiness on feet (R26.81);Pain Pain - Right/Left: Right Pain - part of body: Knee     Time: [id number] PT Time Calculation (min) (ACUTE ONLY): 10 min  Charges:  $Gait Training: 8-22 mins                        Weston Anna, PT Acute Rehabilitation Services Pager: [phone] Office: [phone]

## 2019-04-17 NOTE — TOC Progression Note (Signed)
Transition of Care [facility]) - Progression Note    Patient Details  Name: [name] MRN: [id number] Date of Birth: 02-10-1932  Transition of Care [facility]) CM/SW Contact  Joaquin Courts, RN Phone Number: 04/17/2019, 8:14 AM  Clinical Narrative:    CM spoke with patient twice regarding recommendation for SNF. Patient is not a candidate for CIR. Patient expressed concerns about going to SNF given current Covid pandemic. CM spoke with patient about sending out FL2 to see which facilities have bed availability and then choosing from the ones who have availability and have no covid patients. Patient remains uncertain about SNF, requests CM does not fax out anything until she has spoken with her son, patient feels she is moving around better and may be able to care for herself at home.      Expected Discharge Plan: [place] Barriers to Discharge: Continued Medical Work up  Expected Discharge Plan and Services Expected Discharge Plan: [place]   Discharge Planning Services: CM Consult Post Acute Care Choice: [place] arrangements for the past 2 months: Single Family Home Expected Discharge Date: 04/17/19               DME Arranged: Berta Minor rolling DME Agency: [employer] Date DME Agency Contacted: 04/14/19 Time DME Agency Contacted: 1134 Representative spoke with at DME Agency: [place]: PT [place]: [facility] [employer])     Representative spoke with at [place]: pre arranged in MD office   Social Determinants of Health ([place]) Interventions    Readmission Risk Interventions No flowsheet data found.

## 2019-04-17 NOTE — Progress Notes (Signed)
Physical Therapy Treatment Patient Details Name: Astraea Galella MRN: [id number] DOB: 07/25/1931 Today's Date: 04/17/2019    History of Present Illness 85 y.o. female s.p Rt TKA on 04/13/19 with PMH significant for HTN, HLD, GERD, CAD, anxiety, osteoporosis, and history of MI with cardiac cath in 2013.    PT Comments    Progressing with mobility. Continue to recommend SNF.    Follow Up Recommendations  Follow surgeon's recommendation for DC plan and follow-up therapies;Supervision/Assistance - 24 hour     Equipment Recommendations  Rolling walker with 5" wheels    Recommendations for Other Services       Precautions / Restrictions Precautions Precautions: Fall;Knee Required Braces or Orthoses: Knee Immobilizer - Right Knee Immobilizer - Right: Discontinue once straight leg raise with < 10 degree lag Restrictions Weight Bearing Restrictions: No RLE Weight Bearing: Weight bearing as tolerated    Mobility  Bed Mobility Overal bed mobility: Needs Assistance Bed Mobility: Supine to Sit;Sit to Supine     Supine to sit: Min guard;HOB elevated     General bed mobility comments: for safety. Cues for safety, technique.  Transfers Overall transfer level: Needs assistance Equipment used: Rolling walker (2 wheeled) Transfers: Sit to/from Stand Sit to Stand: Min guard;From elevated surface         General transfer comment: for safety. VCs safety, hand placement.  Ambulation/Gait Ambulation/Gait assistance: Min assist;Min guard Gait Distance (Feet): 150 Feet Assistive device: Rolling walker (2 wheeled) Gait Pattern/deviations: Trunk flexed;Step-to pattern;Step-through pattern;Decreased stride length     General Gait Details: VCs for safety, posture, sequence, step length, proper use of RW. Assist to steady pt and manage RW intermittently.   Stairs             Wheelchair Mobility    Modified Rankin (Stroke Patients Only)       Balance                                            Cognition Arousal/Alertness: Awake/alert Behavior During Therapy: WFL for tasks assessed/performed Overall Cognitive Status: Within Functional Limits for tasks assessed                                        Exercises Total Joint Exercises Ankle Circles/Pumps: AROM;Both;10 reps;Supine Quad Sets: AROM;Both;10 reps;Supine Heel Slides: AAROM;Right;10 reps;Supine Hip ABduction/ADduction: AAROM;Right;10 reps;Supine Straight Leg Raises: AAROM;Right;10 reps;Supine Goniometric ROM: ~10-68 degrees    General Comments        Pertinent Vitals/Pain Pain Assessment: 0-10 Pain Score: 3  Pain Location: Rt Knee Pain Descriptors / Indicators: Sore;Discomfort Pain Intervention(s): Monitored during session;Repositioned;Ice applied    Home Living                      Prior Function            PT Goals (current goals can now be found in the care plan section) Progress towards PT goals: Progressing toward goals    Frequency    7X/week      PT Plan Current plan remains appropriate    Co-evaluation              AM-PAC PT "6 Clicks" Mobility   Outcome Measure  Help needed turning from your back to your side while in a  flat bed without using bedrails?: A Little Help needed moving from lying on your back to sitting on the side of a flat bed without using bedrails?: A Little Help needed moving to and from a bed to a chair (including a wheelchair)?: A Little Help needed standing up from a chair using your arms (e.g., wheelchair or bedside chair)?: A Little Help needed to walk in hospital room?: A Little Help needed climbing 3-5 steps with a railing? : A Little 6 Click Score: 18    End of Session Equipment Utilized During Treatment: Gait belt;Right knee immobilizer Activity Tolerance: Patient tolerated treatment well Patient left: in bed;with call bell/phone within reach;with bed alarm set   PT Visit  Diagnosis: Difficulty in walking, not elsewhere classified (R26.2);Muscle weakness (generalized) (M62.81);Other abnormalities of gait and mobility (R26.89);Unsteadiness on feet (R26.81);Pain Pain - Right/Left: Right Pain - part of body: Knee     Time: [id number] PT Time Calculation (min) (ACUTE ONLY): 23 min  Charges:  $Gait Training: 8-22 mins $Therapeutic Exercise: 8-22 mins              Weston Anna, PT Acute Rehabilitation Services Pager: [phone] Office: [phone]

## 2019-04-17 NOTE — TOC Progression Note (Signed)
Transition of Care [facility]) - Progression Note    Patient Details  Name: [name] MRN: [id number] Date of Birth: 1932/07/01  Transition of Care [facility]) CM/SW [place], [place] Phone Number: 04/17/2019, 4:57 PM  Clinical Narrative:    Patient presented list of facility options. Patient and son [facility]. SNF initiated authorization.    Expected Discharge Plan: [facility]  Barriers to Discharge: PASRR/ COVID-19Test  Expected Discharge Plan and Services Expected Discharge Plan: [place]   Discharge Planning Services: CM Consult Post Acute Care Choice: [place] arrangements for the past 2 months: Single Family Home Expected Discharge Date: 04/17/19               DME Arranged: Berta Minor rolling DME Agency: [employer] Date DME Agency Contacted: 04/14/19 Time DME Agency Contacted: 1134 Representative spoke with at DME Agency: [place]: PT [place]: [facility])     Representative spoke with at [place]: pre arranged in MD office   Social Determinants of Health ([place]) Interventions    Readmission Risk Interventions No flowsheet data found.

## 2019-04-17 NOTE — [place] FL2 (Addendum)
[place] MEDICAID FL2 LEVEL OF CARE SCREENING TOOL     IDENTIFICATION  Patient Name: [name] Birthdate: 12-06-1931 Sex: female Admission Date (Current Location): 04/13/2019  [facility] and [place] Number:  Herbalist and Address:  [facility],  [place], [place]      Provider Number: [id number]  Attending Physician Name and Address:  Mcarthur Rossetti,*  Relative Name and Phone Number:    [place], [place] [phone]      Current Level of Care: Hospital Recommended Level of Care: [place] Prior Approval Number:    Date Approved/Denied:   PASRR Number:    Discharge Plan: SNF    Current Diagnoses: Patient Active Problem List   Diagnosis Date Noted  . Anemia due to acute blood loss 04/14/2019    Class: Acute  . Status post total right knee replacement 04/13/2019  . Unilateral primary osteoarthritis, right knee 07/03/2018  . Precordial pain   . Chest pain 12/08/2015  . Weight loss 06/07/2014  . Hyponatremia 10/02/2013  . Chronic cough 10/02/2013  . Anxiety disorder 10/02/2013  . Chronic constipation 10/02/2013  . Leukopenia 05/31/2013  . Microcytic anemia 05/31/2013  . Anemia of chronic illness 06/02/2012  . Bruising 01/13/2012  . Muscle ache 01/13/2012  . Hypothyroidism   . CAD (coronary artery disease)   . Leukocytopenia   . Chest pain 04/01/2011  . CONSTIPATION 08/08/2008  . COUGH 07/03/2007  . DEPRESSION 06/26/2007  . HYSTERECTOMY, HX OF 06/26/2007  . Other acquired absence of organ 06/26/2007  . COLON POLYP 09/15/2006  . Hyperlipidemia 09/15/2006  . OBESITY, NOS 09/15/2006  . Essential hypertension, benign 09/15/2006  . CORONARY, ARTERIOSCLEROSIS 09/15/2006  . RHINITIS, ALLERGIC 09/15/2006  . ASTHMA, UNSPECIFIED 09/15/2006  . GASTROESOPHAGEAL REFLUX, NO ESOPHAGITIS 09/15/2006  . DIVERTICULITIS OF COLON, NOS 09/15/2006  . OSTEOARTHRITIS, MULTI SITES 09/15/2006  .  INCONTINENCE, URGE 09/15/2006    Orientation RESPIRATION BLADDER Height & Weight     Self, Time, Situation, Place  Normal Continent Weight: 150 lb (68 kg) Height:  5' 0.5" (153.7 cm)  BEHAVIORAL SYMPTOMS/MOOD NEUROLOGICAL BOWEL NUTRITION STATUS      Continent Diet(Regular)  AMBULATORY STATUS COMMUNICATION OF NEEDS Skin   Extensive Assist Verbally Surgical wounds                       Personal Care Assistance Level of Assistance  Bathing, Feeding, Dressing Bathing Assistance: Limited assistance Feeding assistance: Independent Dressing Assistance: Limited assistance     Functional Limitations Info  Sight, Hearing, Speech Sight Info: Adequate Hearing Info: Adequate Speech Info: Adequate    SPECIAL CARE FACTORS FREQUENCY  PT (By licensed PT), OT (By licensed OT)     PT Frequency: 5x/week OT Frequency: 5x/week            Contractures Contractures Info: Not present    Additional Factors Info  Code Status, Allergies, Psychotropic Code Status Info: Fullcode Allergies Info: Statins, Cholestatin, Rosuvastatin, Doxycycline, Prednisone Psychotropic Info: Zyprexa         Current Medications (04/17/2019):  This is the current hospital active medication list Current Facility-Administered Medications  Medication Dose Route Frequency Provider Last Rate Last Dose  . 0.9 %  sodium chloride infusion   Intravenous Continuous Mcarthur Rossetti, MD   Stopped at 04/14/19 1610  . acetaminophen (TYLENOL) tablet 325-650 mg  325-650 mg Oral Q6H PRN Mcarthur Rossetti, MD   650 mg at 04/17/19 0608  . alum &  mag hydroxide-simeth (MAALOX/MYLANTA) 200-200-20 MG/5ML suspension 30 mL  30 mL Oral Q4H PRN Mcarthur Rossetti, MD      . amLODipine (NORVASC) tablet 5 mg  5 mg Oral Daily Mcarthur Rossetti, MD   5 mg at 04/17/19 0855  . aspirin chewable tablet 81 mg  81 mg Oral BID Mcarthur Rossetti, MD   81 mg at 04/17/19 0854  . bisacodyl (DULCOLAX) suppository 10 mg   10 mg Rectal Daily PRN Mcarthur Rossetti, MD      . cholecalciferol (VITAMIN D3) tablet 1,000 Units  1,000 Units Oral Daily Mcarthur Rossetti, MD   1,000 Units at 04/17/19 [phone]  . cloNIDine (CATAPRES) tablet 0.1 mg  0.1 mg Oral Daily Mcarthur Rossetti, MD   0.1 mg at 04/17/19 0855  . diphenhydrAMINE (BENADRYL) 12.5 MG/5ML elixir 12.5-25 mg  12.5-25 mg Oral Q4H PRN Mcarthur Rossetti, MD   12.5 mg at 04/15/19 [id number]  . docusate sodium (COLACE) capsule 100 mg  100 mg Oral BID Mcarthur Rossetti, MD   100 mg at 04/17/19 0854  . ferrous gluconate (FERGON) tablet 324 mg  324 mg Oral BID WC Jessy Oto, MD   324 mg at 04/17/19 0855  . hydrochlorothiazide (HYDRODIURIL) tablet 12.5 mg  12.5 mg Oral Daily Mcarthur Rossetti, MD   12.5 mg at 04/17/19 0854  . HYDROcodone-acetaminophen (NORCO) 7.5-325 MG per tablet 1-2 tablet  1-2 tablet Oral Q4H PRN Mcarthur Rossetti, MD   2 tablet at 04/14/19 1359  . HYDROcodone-acetaminophen (NORCO/VICODIN) 5-325 MG per tablet 1-2 tablet  1-2 tablet Oral Q4H PRN Mcarthur Rossetti, MD   1 tablet at 04/16/19 1703  . irbesartan (AVAPRO) tablet 300 mg  300 mg Oral Daily Mcarthur Rossetti, MD   300 mg at 04/17/19 0854  . levothyroxine (SYNTHROID) tablet 112 mcg  112 mcg Oral QAC breakfast Mcarthur Rossetti, MD   112 mcg at 04/17/19 [id number]  . linaclotide (LINZESS) capsule 145 mcg  145 mcg Oral Daily Mcarthur Rossetti, MD   145 mcg at 04/17/19 0855  . loratadine (CLARITIN) tablet 10 mg  10 mg Oral Daily Mcarthur Rossetti, MD   10 mg at 04/17/19 0855  . menthol-cetylpyridinium (CEPACOL) lozenge 3 mg  1 lozenge Oral PRN Mcarthur Rossetti, MD       Or  . phenol (CHLORASEPTIC) mouth spray 1 spray  1 spray Mouth/Throat PRN Mcarthur Rossetti, MD      . methocarbamol (ROBAXIN) tablet 500 mg  500 mg Oral Q6H PRN Mcarthur Rossetti, MD   500 mg at 04/16/19 2137   Or  . methocarbamol (ROBAXIN) 500 mg in  dextrose 5 % 50 mL IVPB  500 mg Intravenous Q6H PRN Mcarthur Rossetti, MD 100 mL/hr at 04/13/19 1453 500 mg at 04/13/19 1453  . metoCLOPramide (REGLAN) tablet 5-10 mg  5-10 mg Oral Q8H PRN Mcarthur Rossetti, MD       Or  . metoCLOPramide (REGLAN) injection 5-10 mg  5-10 mg Intravenous Q8H PRN Mcarthur Rossetti, MD      . morphine 2 MG/ML injection 0.5-1 mg  0.5-1 mg Intravenous Q2H PRN Mcarthur Rossetti, MD   1 mg at 04/14/19 0557  . multivitamin with minerals tablet 1 tablet  1 tablet Oral Daily Mcarthur Rossetti, MD   1 tablet at 04/17/19 [phone]  . OLANZapine (ZYPREXA) tablet 2.5 mg  2.5 mg Oral QHS Mcarthur Rossetti, MD   2.5  mg at 04/16/19 2138  . ondansetron (ZOFRAN) tablet 4 mg  4 mg Oral Q6H PRN Mcarthur Rossetti, MD       Or  . ondansetron [facility]) injection 4 mg  4 mg Intravenous Q6H PRN Mcarthur Rossetti, MD      . pantoprazole (PROTONIX) EC tablet 40 mg  40 mg Oral Daily Mcarthur Rossetti, MD   40 mg at 04/17/19 0855  . phenazopyridine (PYRIDIUM) tablet 100 mg  100 mg Oral TID WC Jessy Oto, MD   100 mg at 04/17/19 0854  . polyethylene glycol (MIRALAX / GLYCOLAX) packet 17 g  17 g Oral Daily PRN Mcarthur Rossetti, MD   17 g at 04/17/19 0904  . potassium chloride SA (K-DUR) CR tablet 20 mEq  20 mEq Oral Daily Mcarthur Rossetti, MD   20 mEq at 04/17/19 0854  . simvastatin (ZOCOR) tablet 5 mg  5 mg Oral Daily Mcarthur Rossetti, MD   5 mg at 04/17/19 0855  . tamsulosin (FLOMAX) capsule 0.4 mg  0.4 mg Oral Daily Mcarthur Rossetti, MD   0.4 mg at 04/17/19 0856  . traMADol (ULTRAM) tablet 50 mg  50 mg Oral Q6H PRN Mcarthur Rossetti, MD   50 mg at 04/17/19 0853  . vitamin C (ASCORBIC ACID) tablet 500 mg  500 mg Oral Daily Mcarthur Rossetti, MD   500 mg at 04/17/19 [id number]     Discharge Medications: Please see discharge summary for a list of discharge medications.  Relevant Imaging Results:  Relevant  Lab Results:   Additional Information ssn: [id number]  Lia Hopping, LCSW

## 2019-04-17 NOTE — Progress Notes (Signed)
Jean Rosenthal, MD gave verbal orders to place a foley cath d/t urinary retention. Pt voided 400 ml with a residual bladder scan of 500. Foley placed.

## 2019-04-18 LAB — NOVEL CORONAVIRUS, NAA (HOSP ORDER, SEND-OUT TO REF LAB; TAT 18-24 HRS): SARS-CoV-2, NAA: NOT DETECTED

## 2019-04-18 MED ORDER — FLEET ENEMA 7-19 GM/118ML RE ENEM: 1.0000 | ENEMA | Freq: Once | RECTAL | Status: AC

## 2019-04-18 NOTE — Progress Notes (Signed)
Physical Therapy Treatment Patient Details Name: [name] MRN: [id number] DOB: January 15, 1932 Today's Date: 04/18/2019    History of Present Illness 85 y.o. female s.p Rt TKA on 04/13/19 with PMH significant for HTN, HLD, GERD, CAD, anxiety, osteoporosis, and history of MI with cardiac cath in 2013.    PT Comments    Pt is progressing with mobility but requires cues for safety. Plan is for d/c to SNF.    Follow Up Recommendations  Follow surgeon's recommendation for DC plan and follow-up therapies;Supervision/Assistance - 24 hour(plan is for SNF)     Equipment Recommendations  Rolling walker with 5" wheels    Recommendations for Other Services       Precautions / Restrictions Precautions Precautions: Fall;Knee Required Braces or Orthoses: Knee Immobilizer - Right Knee Immobilizer - Right: Discontinue once straight leg raise with < 10 degree lag(did not use KI 9/30) Restrictions Weight Bearing Restrictions: No RLE Weight Bearing: Weight bearing as tolerated    Mobility  Bed Mobility Overal bed mobility: Needs Assistance Bed Mobility: Supine to Sit;Sit to Supine     Supine to sit: Min guard;HOB elevated Sit to supine: Min guard;HOB elevated   General bed mobility comments: close guard for safety.  Transfers Overall transfer level: Needs assistance Equipment used: Rolling walker (2 wheeled) Transfers: Sit to/from Stand Sit to Stand: Min guard         General transfer comment: VCs for hand placement.  Ambulation/Gait Ambulation/Gait assistance: Min guard;Min assist Gait Distance (Feet): 175 Feet Assistive device: Rolling walker (2 wheeled) Gait Pattern/deviations: Trunk flexed;Step-to pattern;Step-through pattern;Decreased stride length     General Gait Details: VCs for safety, posture, sequence, step length, proper use of RW. Intermittent assist to steady pt and manage RW   Stairs             Wheelchair Mobility    Modified Rankin  (Stroke Patients Only)       Balance Overall balance assessment: Needs assistance         Standing balance support: Bilateral upper extremity supported Standing balance-Leahy Scale: Poor                              Cognition Arousal/Alertness: Awake/alert Behavior During Therapy: WFL for tasks assessed/performed Overall Cognitive Status: Within Functional Limits for tasks assessed                                        Exercises Total Joint Exercises Ankle Circles/Pumps: AROM;Both;10 reps;Supine Quad Sets: AROM;Both;10 reps;Supine Heel Slides: AAROM;Right;10 reps;Supine Hip ABduction/ADduction: AAROM;Right;10 reps;Supine Straight Leg Raises: AAROM;Right;10 reps;Supine Goniometric ROM: ~10-70 degrees    General Comments        Pertinent Vitals/Pain Pain Assessment: Faces Pain Score: 5  Faces Pain Scale: Hurts little more Pain Location: R knee Pain Descriptors / Indicators: Sore;Discomfort Pain Intervention(s): Monitored during session;Ice applied;Repositioned    Home Living                      Prior Function            PT Goals (current goals can now be found in the care plan section) Progress towards PT goals: Progressing toward goals    Frequency    7X/week      PT Plan Current plan remains appropriate    Co-evaluation  AM-PAC PT "6 Clicks" Mobility   Outcome Measure  Help needed turning from your back to your side while in a flat bed without using bedrails?: A Little Help needed moving from lying on your back to sitting on the side of a flat bed without using bedrails?: A Little Help needed moving to and from a bed to a chair (including a wheelchair)?: A Little Help needed standing up from a chair using your arms (e.g., wheelchair or bedside chair)?: A Little Help needed to walk in hospital room?: A Little Help needed climbing 3-5 steps with a railing? : A Little 6 Click Score: 18     End of Session Equipment Utilized During Treatment: Gait belt Activity Tolerance: Patient tolerated treatment well Patient left: in bed;with call bell/phone within reach;with bed alarm set   PT Visit Diagnosis: Difficulty in walking, not elsewhere classified (R26.2);Muscle weakness (generalized) (M62.81);Other abnormalities of gait and mobility (R26.89);Unsteadiness on feet (R26.81);Pain Pain - Right/Left: Right Pain - part of body: Knee     Time: [id number] PT Time Calculation (min) (ACUTE ONLY): 10 min  Charges:  $Gait Training: 8-22 mins             Weston Anna, PT Acute Rehabilitation Services Pager: [phone] Office: [phone]

## 2019-04-18 NOTE — Plan of Care (Signed)
  Problem: Health Behavior/Discharge Planning: Goal: Ability to manage health-related needs will improve Outcome: Progressing   Problem: Nutrition: Goal: Adequate nutrition will be maintained Outcome: Progressing   Problem: Coping: Goal: Level of anxiety will decrease Outcome: Progressing   Problem: Pain Managment: Goal: General experience of comfort will improve Outcome: Progressing   

## 2019-04-18 NOTE — Progress Notes (Signed)
Physical Therapy Treatment Patient Details Name: [name] MRN: [id number] DOB: 01-12-1932 Today's Date: 04/18/2019    History of Present Illness 85 y.o. female s.p Rt TKA on 04/13/19 with PMH significant for HTN, HLD, GERD, CAD, anxiety, osteoporosis, and history of MI with cardiac cath in 2013.    PT Comments    Pt continues to participate well. Plan is for SNF placement for continued rehab.    Follow Up Recommendations  Follow surgeon's recommendation for DC plan and follow-up therapies;Supervision/Assistance - 24 hour(plan is for SNF)     Equipment Recommendations  Rolling walker with 5" wheels    Recommendations for Other Services       Precautions / Restrictions Precautions Precautions: Fall;Knee Required Braces or Orthoses: Knee Immobilizer - Right Knee Immobilizer - Right: Discontinue once straight leg raise with < 10 degree lag(did not use KI 9/30) Restrictions Weight Bearing Restrictions: No RLE Weight Bearing: Weight bearing as tolerated Other Position/Activity Restrictions: WBAT    Mobility  Bed Mobility Overal bed mobility: Modified Independent       Supine to sit: Supervision;HOB elevated     General bed mobility comments: oob in recliner  Transfers Overall transfer level: Needs assistance Equipment used: Rolling walker (2 wheeled) Transfers: Sit to/from Stand Sit to Stand: Min guard         General transfer comment: VCs for hand placement.  Ambulation/Gait Ambulation/Gait assistance: Min guard;Min assist Gait Distance (Feet): 160 Feet Assistive device: Rolling walker (2 wheeled) Gait Pattern/deviations: Trunk flexed;Step-to pattern;Step-through pattern;Decreased stride length     General Gait Details: VCs for safety, posture, sequence, step length, proper use of RW. Assist to steady pt and manage RW intermittently.   Stairs             Wheelchair Mobility    Modified Rankin (Stroke Patients Only)       Balance  Overall balance assessment: Needs assistance Sitting-balance support: Feet supported;No upper extremity supported Sitting balance-Leahy Scale: Good     Standing balance support: Bilateral upper extremity supported Standing balance-Leahy Scale: Poor Standing balance comment: Pt could let go of walker just to bathe in standing for short periods of time.                            Cognition Arousal/Alertness: Awake/alert Behavior During Therapy: WFL for tasks assessed/performed Overall Cognitive Status: Within Functional Limits for tasks assessed Area of Impairment: Memory                     Memory: Decreased short-term memory         General Comments: No cognitive deficits outside of baseline noted today      Exercises Total Joint Exercises Ankle Circles/Pumps: AROM;Both;10 reps;Supine Quad Sets: AROM;Both;10 reps;Supine Heel Slides: AAROM;Right;10 reps;Supine Hip ABduction/ADduction: AAROM;Right;10 reps;Supine Straight Leg Raises: AAROM;Right;10 reps;Supine Goniometric ROM: ~10-70 degrees    General Comments General comments (skin integrity, edema, etc.): Pt progressing well toward all goals.      Pertinent Vitals/Pain Pain Assessment: 0-10 Pain Score: 5  Pain Location: R knee Pain Descriptors / Indicators: Sore;Discomfort Pain Intervention(s): Limited activity within patient's tolerance;Monitored during session;Ice applied;Repositioned    Home Living                      Prior Function            PT Goals (current goals can now be found in the care plan section)  Acute Rehab PT Goals Patient Stated Goal: go home Progress towards PT goals: Progressing toward goals    Frequency    7X/week      PT Plan Current plan remains appropriate    Co-evaluation              AM-PAC PT "6 Clicks" Mobility   Outcome Measure  Help needed turning from your back to your side while in a flat bed without using bedrails?: A  Little Help needed moving from lying on your back to sitting on the side of a flat bed without using bedrails?: A Little Help needed moving to and from a bed to a chair (including a wheelchair)?: A Little Help needed standing up from a chair using your arms (e.g., wheelchair or bedside chair)?: A Little Help needed to walk in hospital room?: A Little Help needed climbing 3-5 steps with a railing? : A Little 6 Click Score: 18    End of Session Equipment Utilized During Treatment: Gait belt Activity Tolerance: Patient tolerated treatment well Patient left: in chair;with call bell/phone within reach;with chair alarm set   PT Visit Diagnosis: Difficulty in walking, not elsewhere classified (R26.2);Muscle weakness (generalized) (M62.81);Other abnormalities of gait and mobility (R26.89);Unsteadiness on feet (R26.81);Pain Pain - Right/Left: Right Pain - part of body: Knee     Time: [id number] PT Time Calculation (min) (ACUTE ONLY): 12 min  Charges:  $Gait Training: 8-22 mins                        Weston Anna, PT Acute Rehabilitation Services Pager: [phone] Office: [phone]

## 2019-04-18 NOTE — Plan of Care (Signed)
Continue current POC 

## 2019-04-18 NOTE — Discharge Summary (Signed)
Patient ID: [name] MRN: [id number] DOB/AGE: [birth year] 85 y.o.  Admit date: 04/13/2019 Discharge date: 04/18/2019  Admission Diagnoses:  Principal Problem:   Unilateral primary osteoarthritis, right knee Active Problems:   Status post total right knee replacement   Anemia due to acute blood loss   Discharge Diagnoses:  Same  Past Medical History:  Diagnosis Date  . Anemia 06/02/2012  . Anginal pain ([place])    complained of chest pain last week lasting momentarily week of 04/02/2019 took reflux medication, then took a nitro and laid down and got relief  . Anxiety   . Arthritis   . CAD (coronary artery disease)    a. 2005 small inferoapical MI --> medical therapy b. 2007 - nomral coronaries c. 11/2015 mid to distal LAD 20% stenosis, 1st diag 30%, and 50%,--> medical therapy    . Cataracts, bilateral 09/11/2011   Per report of pt.  . Esophageal abnormality 09/11/2011   Surgically treated per pt.  Marland Kitchen GERD (gastroesophageal reflux disease)   . Headache(784.0)   . Hypercholesterolemia 09/11/2011  . Hyperlipemia   . Hypertension   . Hypothyroid 09/11/2011  . Hypothyroidism   . Insect bite of ankle, left 09/11/2011   back of ankle  . Kidney mass 09/11/2011   Per report of pt  . Leukocytopenia   . Liver mass 09/11/2011   Per report of pt  . Mental health disorder   . Myocardial infarction ([place]) 09/11/2011   Per pt in 2005 (small vessel)  . Osteoporosis 09/11/2011  . Pneumonia   . Thigh cramp 09/11/2011   Left thigh x 3 nights since starting unspecified psychotropic medication    Surgeries: Procedure(s): RIGHT TOTAL KNEE ARTHROPLASTY on 04/13/2019   Consultants:   Discharged Condition: Improved  Hospital Course: [name] is an 85 y.o. female who was admitted 04/13/2019 for operative treatment ofUnilateral primary osteoarthritis, right knee. Patient has severe unremitting pain that affects sleep, daily activities, and work/hobbies. After pre-op  clearance the patient was taken to the operating room on 04/13/2019 and underwent  Procedure(s): RIGHT TOTAL KNEE ARTHROPLASTY.    Patient was given perioperative antibiotics:  Anti-infectives (From admission, onward)   Start     Dose/Rate Route Frequency Ordered Stop   04/13/19 1700  ceFAZolin (ANCEF) IVPB 1 g/50 mL premix     1 g 100 mL/hr over 30 Minutes Intravenous Every 6 hours 04/13/19 1539 04/13/19 2341   04/13/19 0845  ceFAZolin (ANCEF) IVPB 2g/100 mL premix     2 g 200 mL/hr over 30 Minutes Intravenous On call to O.R. 04/13/19 [id number] 04/13/19 1142       Patient was given sequential compression devices, early ambulation, and chemoprophylaxis to prevent DVT.  Patient benefited maximally from hospital stay and there were no complications.    Recent vital signs:  Patient Vitals for the past 24 hrs:  BP Temp Temp src Pulse Resp SpO2  04/18/19 0540 129/71 98 F (36.7 C) - 85 16 94 %  04/17/19 2140 132/63 98.8 F (37.1 C) - 99 16 96 %  04/17/19 1702 129/65 98.5 F (36.9 C) - 76 16 96 %  04/17/19 1446 117/64 98.2 F (36.8 C) Oral 86 16 96 %  04/17/19 0851 140/69 - - 86 - -     Recent laboratory studies:  Recent Labs    04/16/19 0308  WBC 6.8  HGB 9.0*  HCT 27.8*  PLT 141*  NA 127*  K 3.9  CL 92*  CO2 22  BUN  7*  CREATININE 0.59  GLUCOSE 114*  CALCIUM 8.8*     Discharge Medications:   Allergies as of 04/18/2019      Reactions   Statins Other (See Comments)   Muscle aches   Cholestatin    Rosuvastatin Other (See Comments)   Doxycycline Itching, Rash   Prednisone Itching      Medication List    STOP taking these medications   aspirin EC 81 MG tablet Replaced by: aspirin 81 MG chewable tablet     TAKE these medications   acetaminophen 500 MG tablet Commonly known as: TYLENOL Take 1,000 mg by mouth every 6 (six) hours as needed for moderate pain.   Align 4 MG Caps Take 4 mg by mouth daily.   amLODipine 5 MG tablet Commonly known as: NORVASC Take  5 mg by mouth daily.   aspirin 81 MG chewable tablet Chew 1 tablet (81 mg total) by mouth 2 (two) times daily. Replaces: aspirin EC 81 MG tablet   Azelastine HCl 0.15 % Soln USE 1 SPRAY IN EACH NOSTRIL UP TO TWO TIMES DAILY AS  NEEDED   Cholecalciferol 250 MCG (10000 UT) Tabs Take 1,000 Units by mouth daily.   cloNIDine 0.1 MG tablet Commonly known as: CATAPRES Take 0.1 mg by mouth daily.   docusate sodium 100 MG capsule Commonly known as: COLACE Take 100-200 mg by mouth daily as needed for moderate constipation.   famotidine 10 MG tablet Commonly known as: PEPCID Take 10 mg by mouth daily.   hydrochlorothiazide 12.5 MG tablet Commonly known as: HYDRODIURIL Take 12.5 mg by mouth daily.   HYDROcodone-acetaminophen 5-325 MG tablet Commonly known as: NORCO/VICODIN Take 1-2 tablets by mouth every 4 (four) hours as needed for moderate pain (pain score 4-6).   levothyroxine 112 MCG tablet Commonly known as: SYNTHROID Take 112 mcg by mouth daily before breakfast.   Linzess 145 MCG Caps capsule Generic drug: linaclotide Take 145 mcg by mouth daily.   loratadine 10 MG tablet Commonly known as: CLARITIN Take 10 mg by mouth daily.   multivitamin with minerals Tabs tablet Take 1 tablet by mouth daily.   nitroGLYCERIN 0.4 MG SL tablet Commonly known as: NITROSTAT Place 1 tablet (0.4 mg total) under the tongue every 5 (five) minutes x 3 doses as needed for chest pain.   OLANZapine 2.5 MG tablet Commonly known as: ZYPREXA Take 2.5 mg by mouth at bedtime.   olmesartan 40 MG tablet Commonly known as: BENICAR Take 40 mg by mouth daily.   polyethylene glycol 17 g packet Commonly known as: MiraLax Take 17 g by mouth 2 (two) times daily.   potassium chloride SA 20 MEQ tablet Commonly known as: KLOR-CON Take 20 mEq by mouth daily.   simvastatin 10 MG tablet Commonly known as: ZOCOR Take 5 mg by mouth daily.   traZODone 150 MG tablet Commonly known as: DESYREL Take  225 mg by mouth at bedtime.   traZODone 100 MG tablet Commonly known as: DESYREL Take 2 tablets (200 mg total) by mouth at bedtime.   vitamin C 500 MG tablet Commonly known as: ASCORBIC ACID Take 500 mg by mouth daily.            Durable Medical Equipment  (From admission, onward)         Start     Ordered   04/13/19 1540  DME 3 n 1  Once     04/13/19 1539   04/13/19 1540  DME Walker rolling  Once  Question:  Patient needs a walker to treat with the following condition  Answer:  Status post total right knee replacement   04/13/19 1539          Diagnostic Studies: Dg Knee Right Port  Result Date: 04/13/2019 CLINICAL DATA:  Status post right knee replacement. EXAM: PORTABLE RIGHT KNEE - 1-2 VIEW COMPARISON:  September 30th 2019 FINDINGS: The patient is status post knee replacement. Hardware is in good position. Skin staples are noted. IMPRESSION: Right knee replacement as above. Electronically Signed   By: Dorise Bullion III M.D   On: 04/13/2019 13:44    Disposition: Discharge disposition: 03-Skilled [place] Follow up.   Specialty: Home Health Services Why: agency will provide home health physical therapy. agency will call you to schedule first visit. Contact information: [place] [address] [phone]        Mcarthur Rossetti, MD Follow up in 2 week(s).   Specialty: Orthopedic Surgery Contact information: [place] [address] [phone]            Signed: Mcarthur Rossetti 04/18/2019, 6:57 AM

## 2019-04-18 NOTE — Plan of Care (Signed)
  Problem: Health Behavior/Discharge Planning: Goal: Ability to manage health-related needs will improve Outcome: Progressing   Problem: Clinical Measurements: Goal: Diagnostic test results will improve Outcome: Progressing Goal: Respiratory complications will improve Outcome: Progressing Goal: Cardiovascular complication will be avoided Outcome: Progressing   Problem: Activity: Goal: Risk for activity intolerance will decrease Outcome: Progressing   Problem: Nutrition: Goal: Adequate nutrition will be maintained Outcome: Progressing   

## 2019-04-18 NOTE — TOC Transition Note (Addendum)
Transition of Care [facility]) - CM/SW Discharge Note   Patient Details  Name: [name] MRN: [id number] Date of Birth: 05-23-1932  [facility] CM/SW Contact:  Lia Hopping, [place] Phone Number: 04/18/2019, 3:10 PM   Clinical Narrative:    PASRR received. [employer] fast streamer received. COVID-19 test resulted negative.  PTAR to transport.   3:22pm In the process of discharging the patient CSW received a call from Admitting at SNF stating they will not have a bed for the patient today due to unforeseen circumstances. CSW notified the patient and son. Patient will transport in the am.    Final next level of care: [place] Barriers to Discharge: Continued Medical Work up   Patient Goals and CMS Choice Patient states their goals for this hospitalization and ongoing recovery are:: to go home CMS Medicare.gov Compare Post Acute Care list provided to:: Patient Choice offered to / list presented to : Patient  Discharge Placement PASRR number recieved: 04/18/19            Patient [facility] bed at: [facility] Patient to be transferred to facility by: [place] Name of family member notified: Son [name] Patient and family notified of of transfer: 04/18/19  Discharge Plan and Services   Discharge Planning Services: CM Consult Post Acute Care Choice: Home Health          DME Arranged: 3-N-1, Walker rolling DME Agency: [employer] Date DME Agency Contacted: 04/14/19 Time DME Agency Contacted: 1134 Representative spoke with at DME Agency: [place]: PT Bell: [facility]     Representative spoke with at [place]: pre arranged in MD office  Social Determinants of Health ([place]) Interventions     Readmission Risk Interventions No flowsheet data found.

## 2019-04-18 NOTE — Progress Notes (Signed)
Patient ID: [name], female   DOB: [birth year], 85 y.o.   MRN: [id number] No acute changes.  Right knee stable.  Medically stable.  Can be discharged to skilled nursing today.

## 2019-04-18 NOTE — Progress Notes (Signed)
Occupational Therapy Treatment Patient Details Name: Aziza Renicker MRN: [id number] DOB: 20-May-1932 Today's Date: 04/18/2019    History of present illness 85 y.o. female s.p Rt TKA on 04/13/19 with PMH significant for HTN, HLD, GERD, CAD, anxiety, osteoporosis, and history of MI with cardiac cath in 2013.   OT comments  Pt making great progress meeting 3/5 goals.  Pt is overall requiring supervision to min assist with basic adls.  Pt to d/c to SNF today per chart.   Follow Up Recommendations  Follow surgeon's recommendation for DC plan and follow-up therapies;SNF;Supervision/Assistance - 24 hour    Equipment Recommendations  None recommended by OT    Recommendations for Other Services      Precautions / Restrictions Precautions Precautions: Fall;Knee Required Braces or Orthoses: Knee Immobilizer - Right Knee Immobilizer - Right: Discontinue once straight leg raise with < 10 degree lag Restrictions Weight Bearing Restrictions: No RLE Weight Bearing: Weight bearing as tolerated Other Position/Activity Restrictions: WBAT       Mobility Bed Mobility Overal bed mobility: Modified Independent       Supine to sit: Supervision;HOB elevated     General bed mobility comments: Pt with increased independence getting to EOB today requiring no physical assist.  Transfers Overall transfer level: Needs assistance Equipment used: Rolling walker (2 wheeled) Transfers: Sit to/from Stand Sit to Stand: Min guard         General transfer comment: VCs for hand placement.    Balance Overall balance assessment: Needs assistance Sitting-balance support: Feet supported;No upper extremity supported Sitting balance-Leahy Scale: Good     Standing balance support: Bilateral upper extremity supported Standing balance-Leahy Scale: Fair Standing balance comment: Pt could let go of walker just to bathe in standing for short periods of time.                           ADL  either performed or assessed with clinical judgement   ADL Overall ADL's : Needs assistance/impaired Eating/Feeding: Independent;Sitting   Grooming: Wash/dry hands;Wash/dry face;Oral care;Supervision/safety;Standing Grooming Details (indicate cue type and reason): stood at sink for 4 minutes Upper Body Bathing: Set up;Sitting   Lower Body Bathing: Minimal assistance;Sit to/from stand;Cueing for compensatory techniques Lower Body Bathing Details (indicate cue type and reason): min assist just to reach bottom of R foot because immobilizer was on.   Upper Body Dressing : Set up;Sitting       Toilet Transfer: Min guard;Ambulation;Comfort height toilet;Grab bars;RW Armed forces technical officer Details (indicate cue type and reason): VC for safety Toileting- Clothing Manipulation and Hygiene: Min guard;Sit to/from stand Toileting - Clothing Manipulation Details (indicate cue type and reason): cuing for hand placement sit to stand     Functional mobility during ADLs: Min guard;Rolling walker General ADL Comments: Pt doing better with adls. [place] on feet.     Vision       Perception     Praxis      Cognition Arousal/Alertness: Awake/alert Behavior During Therapy: WFL for tasks assessed/performed Overall Cognitive Status: Within Functional Limits for tasks assessed Area of Impairment: Memory                     Memory: Decreased short-term memory         General Comments: No cognitive deficits outside of baseline noted today        Exercises     Shoulder Instructions       General Comments Pt progressing well toward  all goals.    Pertinent Vitals/ Pain       Pain Assessment: 0-10 Pain Score: 3  Pain Location: R knee Pain Descriptors / Indicators: Sore;Discomfort Pain Intervention(s): Monitored during session;Repositioned;Ice applied  Home Living                                          Prior Functioning/Environment               Frequency  Min 2X/week        Progress Toward Goals  OT Goals(current goals can now be found in the care plan section)  Progress towards OT goals: Progressing toward goals  Acute Rehab OT Goals Patient Stated Goal: go home OT Goal Formulation: With patient Time For Goal Achievement: 04/29/19 Potential to Achieve Goals: Good ADL Goals Pt Will Perform Grooming: with supervision;standing Pt Will Perform Lower Body Bathing: with min assist;sit to/from stand;sitting/lateral leans;with adaptive equipment Pt Will Perform Lower Body Dressing: with min assist;with adaptive equipment;sit to/from stand;sitting/lateral leans Pt Will Transfer to Toilet: with min assist;ambulating;regular height toilet Pt Will Perform Tub/Shower Transfer: with min assist;shower seat;ambulating;grab bars;rolling walker  Plan Discharge plan remains appropriate    Co-evaluation                 AM-PAC OT "6 Clicks" Daily Activity     Outcome Measure   Help from another person eating meals?: None Help from another person taking care of personal grooming?: None Help from another person toileting, which includes using toliet, bedpan, or urinal?: A Little Help from another person bathing (including washing, rinsing, drying)?: A Little Help from another person to put on and taking off regular upper body clothing?: None Help from another person to put on and taking off regular lower body clothing?: A Little 6 Click Score: 21    End of Session Equipment Utilized During Treatment: Gait belt;Rolling walker CPM Right Knee CPM Right Knee: Off  OT Visit Diagnosis: Other abnormalities of gait and mobility (R26.89);Unsteadiness on feet (R26.81);Muscle weakness (generalized) (M62.81);Pain Pain - Right/Left: Right Pain - part of body: Leg   Activity Tolerance Patient tolerated treatment well   Patient Left in chair;with call bell/phone within reach;with chair alarm set   Nurse Communication Mobility  status        Time: [id number] OT Time Calculation (min): 33 min  Charges: OT General Charges $OT Visit: 1 Visit OT Treatments $Self Care/Home Management : 23-37 mins    Glenford Peers 04/18/2019, 9:25 AM

## 2019-04-19 NOTE — Plan of Care (Signed)
  Problem: Health Behavior/Discharge Planning: Goal: Ability to manage health-related needs will improve Outcome: Adequate for Discharge   Problem: Clinical Measurements: Goal: Diagnostic test results will improve Outcome: Adequate for Discharge Goal: Respiratory complications will improve Outcome: Adequate for Discharge Goal: Cardiovascular complication will be avoided Outcome: Adequate for Discharge   Problem: Activity: Goal: Risk for activity intolerance will decrease Outcome: Adequate for Discharge   Problem: Nutrition: Goal: Adequate nutrition will be maintained Outcome: Adequate for Discharge   Problem: Coping: Goal: Level of anxiety will decrease Outcome: Adequate for Discharge   Problem: Elimination: Goal: Will not experience complications related to bowel motility Outcome: Adequate for Discharge Goal: Will not experience complications related to urinary retention Outcome: Adequate for Discharge   Problem: Pain Managment: Goal: General experience of comfort will improve Outcome: Adequate for Discharge   Problem: Safety: Goal: Ability to remain free from injury will improve Outcome: Adequate for Discharge   Problem: Skin Integrity: Goal: Risk for impaired skin integrity will decrease Outcome: Adequate for Discharge   Problem: Education: Goal: Knowledge of the prescribed therapeutic regimen will improve Outcome: Adequate for Discharge Goal: Individualized Educational Video(s) Outcome: Adequate for Discharge   Problem: Activity: Goal: Ability to avoid complications of mobility impairment will improve Outcome: Adequate for Discharge Goal: Range of joint motion will improve Outcome: Adequate for Discharge   Problem: Clinical Measurements: Goal: Postoperative complications will be avoided or minimized Outcome: Adequate for Discharge   Problem: Pain Management: Goal: Pain level will decrease with appropriate interventions Outcome: Adequate for Discharge    Problem: Skin Integrity: Goal: Will show signs of wound healing Outcome: Adequate for Discharge

## 2019-04-19 NOTE — TOC Transition Note (Signed)
Transition of Care [facility]) - CM/SW Discharge Note   Patient Details  Name: [name] MRN: [id number] Date of Birth: 11/21/1931  Transition of Care [facility]) CM/SW Contact:  Lia Hopping, Celeste Phone Number: 04/19/2019, 12:15 PM   Clinical Narrative:    Patient will discharge to [facility] arranged for 1:00pm, per SNF request Nurse call report to: [phone] Room:130 A CSW notified son [name]   Final next level of care: [place] Barriers to Discharge: Continued Medical Work up   Patient Goals and CMS Choice Patient states their goals for this hospitalization and ongoing recovery are:: to go home CMS Medicare.gov Compare Post Acute Care list provided to:: Patient Choice offered to / list presented to : Patient  Discharge Placement PASRR number recieved: 04/18/19            Patient chooses bed at: [facility] Patient to be transferred to facility by: [place] Name of family member notified: Son [name] Patient and family notified of of transfer: 04/18/19  Discharge Plan and Services   Discharge Planning Services: CM Consult Post Acute Care Choice: Home Health          DME Arranged: 3-N-1, Walker rolling DME Agency: [employer] Date DME Agency Contacted: 04/14/19 Time DME Agency Contacted: 1134 Representative spoke with at DME Agency: [place]: PT [place]: [facility]     Representative spoke with at [place]: pre arranged in MD office  Social Determinants of Health ([place]) Interventions     Readmission Risk Interventions No flowsheet data found.

## 2019-04-19 NOTE — Discharge Summary (Signed)
Patient ID: [name] MRN: [id number] DOB/AGE: 83-Mar-1933 83 y.o.  Admit date: 04/13/2019 Discharge date: 04/19/2019  Admission Diagnoses:  Principal Problem:   Unilateral primary osteoarthritis, right knee Active Problems:   Status post total right knee replacement   Anemia due to acute blood loss   Discharge Diagnoses:  Same  Past Medical History:  Diagnosis Date  . Anemia 06/02/2012  . Anginal pain ([place])    complained of chest pain last week lasting momentarily week of 04/02/2019 took reflux medication, then took a nitro and laid down and got relief  . Anxiety   . Arthritis   . CAD (coronary artery disease)    a. 2005 small inferoapical MI --> medical therapy b. 2007 - nomral coronaries c. 11/2015 mid to distal LAD 20% stenosis, 1st diag 30%, and 50%,--> medical therapy    . Cataracts, bilateral 09/11/2011   Per report of pt.  . Esophageal abnormality 09/11/2011   Surgically treated per pt.  Marland Kitchen GERD (gastroesophageal reflux disease)   . Headache(784.0)   . Hypercholesterolemia 09/11/2011  . Hyperlipemia   . Hypertension   . Hypothyroid 09/11/2011  . Hypothyroidism   . Insect bite of ankle, left 09/11/2011   back of ankle  . Kidney mass 09/11/2011   Per report of pt  . Leukocytopenia   . Liver mass 09/11/2011   Per report of pt  . Mental health disorder   . Myocardial infarction ([place]) 09/11/2011   Per pt in 2005 (small vessel)  . Osteoporosis 09/11/2011  . Pneumonia   . Thigh cramp 09/11/2011   Left thigh x 3 nights since starting unspecified psychotropic medication    Surgeries: Procedure(s): RIGHT TOTAL KNEE ARTHROPLASTY on 04/13/2019   Consultants:   Discharged Condition: Improved  Hospital Course: Cade Shene Jerdee is an 85 y.o. female who was admitted 04/13/2019 for operative treatment ofUnilateral primary osteoarthritis, right knee. Patient has severe unremitting pain that affects sleep, daily activities, and work/hobbies. After pre-op  clearance the patient was taken to the operating room on 04/13/2019 and underwent  Procedure(s): RIGHT TOTAL KNEE ARTHROPLASTY.    Patient was given perioperative antibiotics:  Anti-infectives (From admission, onward)   Start     Dose/Rate Route Frequency Ordered Stop   04/13/19 1700  ceFAZolin (ANCEF) IVPB 1 g/50 mL premix     1 g 100 mL/hr over 30 Minutes Intravenous Every 6 hours 04/13/19 1539 04/13/19 2341   04/13/19 0845  ceFAZolin (ANCEF) IVPB 2g/100 mL premix     2 g 200 mL/hr over 30 Minutes Intravenous On call to O.R. 04/13/19 [id number] 04/13/19 1142       Patient was given sequential compression devices, early ambulation, and chemoprophylaxis to prevent DVT.  Patient benefited maximally from hospital stay and there were no complications.    Recent vital signs:  Patient Vitals for the past 24 hrs:  BP Temp Temp src Pulse Resp SpO2  04/19/19 0501 (!) 126/58 97.9 F (36.6 C) - 88 16 99 %  04/18/19 1321 (!) 110/57 98.7 F (37.1 C) Oral 93 18 98 %  04/18/19 1311 (!) 117/53 98.5 F (36.9 C) Oral 98 16 97 %  04/18/19 0949 135/64 98.5 F (36.9 C) - 89 16 98 %     Recent laboratory studies: No results for input(s): WBC, HGB, HCT, PLT, NA, K, CL, CO2, BUN, CREATININE, GLUCOSE, INR, CALCIUM in the last 72 hours.  Invalid input(s): PT, 2   Discharge Medications:   Allergies as of 04/19/2019  Reactions   Statins Other (See Comments)   Muscle aches   Cholestatin    Rosuvastatin Other (See Comments)   Doxycycline Itching, Rash   Prednisone Itching      Medication List    STOP taking these medications   aspirin EC 81 MG tablet Replaced by: aspirin 81 MG chewable tablet     TAKE these medications   acetaminophen 500 MG tablet Commonly known as: TYLENOL Take 1,000 mg by mouth every 6 (six) hours as needed for moderate pain.   Align 4 MG Caps Take 4 mg by mouth daily.   amLODipine 5 MG tablet Commonly known as: NORVASC Take 5 mg by mouth daily.   aspirin 81 MG  chewable tablet Chew 1 tablet (81 mg total) by mouth 2 (two) times daily. Replaces: aspirin EC 81 MG tablet   Azelastine HCl 0.15 % Soln USE 1 SPRAY IN EACH NOSTRIL UP TO TWO TIMES DAILY AS  NEEDED   Cholecalciferol 250 MCG (10000 UT) Tabs Take 1,000 Units by mouth daily.   cloNIDine 0.1 MG tablet Commonly known as: CATAPRES Take 0.1 mg by mouth daily.   docusate sodium 100 MG capsule Commonly known as: COLACE Take 100-200 mg by mouth daily as needed for moderate constipation.   famotidine 10 MG tablet Commonly known as: PEPCID Take 10 mg by mouth daily.   hydrochlorothiazide 12.5 MG tablet Commonly known as: HYDRODIURIL Take 12.5 mg by mouth daily.   HYDROcodone-acetaminophen 5-325 MG tablet Commonly known as: NORCO/VICODIN Take 1-2 tablets by mouth every 4 (four) hours as needed for moderate pain (pain score 4-6).   levothyroxine 112 MCG tablet Commonly known as: SYNTHROID Take 112 mcg by mouth daily before breakfast.   Linzess 145 MCG Caps capsule Generic drug: linaclotide Take 145 mcg by mouth daily.   loratadine 10 MG tablet Commonly known as: CLARITIN Take 10 mg by mouth daily.   multivitamin with minerals Tabs tablet Take 1 tablet by mouth daily.   nitroGLYCERIN 0.4 MG SL tablet Commonly known as: NITROSTAT Place 1 tablet (0.4 mg total) under the tongue every 5 (five) minutes x 3 doses as needed for chest pain.   OLANZapine 2.5 MG tablet Commonly known as: ZYPREXA Take 2.5 mg by mouth at bedtime.   olmesartan 40 MG tablet Commonly known as: BENICAR Take 40 mg by mouth daily.   polyethylene glycol 17 g packet Commonly known as: MiraLax Take 17 g by mouth 2 (two) times daily.   potassium chloride SA 20 MEQ tablet Commonly known as: KLOR-CON Take 20 mEq by mouth daily.   simvastatin 10 MG tablet Commonly known as: ZOCOR Take 5 mg by mouth daily.   traZODone 150 MG tablet Commonly known as: DESYREL Take 225 mg by mouth at bedtime.    traZODone 100 MG tablet Commonly known as: DESYREL Take 2 tablets (200 mg total) by mouth at bedtime.   vitamin C 500 MG tablet Commonly known as: ASCORBIC ACID Take 500 mg by mouth daily.            Durable Medical Equipment  (From admission, onward)         Start     Ordered   04/13/19 1540  DME 3 n 1  Once     04/13/19 1539   04/13/19 1540  DME Walker rolling  Once    Question:  Patient needs a walker to treat with the following condition  Answer:  Status post total right knee replacement   04/13/19 1539  Diagnostic Studies: Dg Knee Right Port  Result Date: 04/13/2019 CLINICAL DATA:  Status post right knee replacement. EXAM: PORTABLE RIGHT KNEE - 1-2 VIEW COMPARISON:  September 30th 2019 FINDINGS: The patient is status post knee replacement. Hardware is in good position. Skin staples are noted. IMPRESSION: Right knee replacement as above. Electronically Signed   By: Dorise Bullion III M.D   On: 04/13/2019 13:44    Disposition: Discharge disposition: 03-Skilled [place] information for follow-up providers    [facility].   Specialty: Home Health Services Why: agency will provide home health physical therapy. agency will call you to schedule first visit. Contact information: [place] [address] [phone]        Mcarthur Rossetti, MD Follow up in 2 week(s).   Specialty: Orthopedic Surgery Contact information: [place] [address] [phone]            Contact information for after-discharge care    Destination    [facility] Preferred SNF .   Service: Skilled Nursing Contact information: [address] [place] [phone]                   Signed: Mcarthur Rossetti 04/19/2019, 7:37 AM

## 2019-04-19 NOTE — Progress Notes (Signed)
Report given to Lowella Bandy Rn at [facility].

## 2019-04-19 NOTE — Progress Notes (Signed)
Physical Therapy Treatment Patient Details Name: [name] MRN: [id number] DOB: Oct 19, 1931 Today's Date: 04/19/2019    History of Present Illness 85 y.o. female s.p Rt TKA on 04/13/19 with PMH significant for HTN, HLD, GERD, CAD, anxiety, osteoporosis, and history of MI with cardiac cath in 2013.    PT Comments    POD # 6 Assisted OOB to amb in hallway then returned to room to perform some TKR TE's followed by ICE.   Follow Up Recommendations   SNF     Equipment Recommendations       Recommendations for Other Services       Precautions / Restrictions      Mobility  Bed Mobility Overal bed mobility: Needs Assistance Bed Mobility: Supine to Sit     Supine to sit: Min guard;HOB elevated     General bed mobility comments: close guard for safety.  Transfers Overall transfer level: Needs assistance Equipment used: Rolling walker (2 wheeled) Transfers: Sit to/from Stand Sit to Stand: Min guard Stand pivot transfers: Min assist       General transfer comment: VCs for hand placement and safety with stairs  Ambulation/Gait Ambulation/Gait assistance: Min guard;Min assist Gait Distance (Feet): 125 Feet Assistive device: Rolling walker (2 wheeled) Gait Pattern/deviations: Trunk flexed;Step-to pattern;Step-through pattern;Decreased stride length Gait velocity: decreased   General Gait Details: VCs for safety, posture, sequence, step length, proper use of RW. Intermittent assist to steady pt and manage RW   Stairs             Wheelchair Mobility    Modified Rankin (Stroke Patients Only)       Balance                                            Cognition Arousal/Alertness: Awake/alert Behavior During Therapy: WFL for tasks assessed/performed Overall Cognitive Status: Within Functional Limits for tasks assessed                                        Exercises   Total Knee Replacement TE's 10 reps B LE  ankle pumps 10 reps towel squeezes 10 reps knee presses 10 reps heel slides  10 reps SAQ's 10 reps SLR's 10 reps ABD Followed by ICE     General Comments        Pertinent Vitals/Pain Pain Assessment: 0-10 Pain Score: 3  Pain Location: R knee Pain Descriptors / Indicators: Sore;Discomfort Pain Intervention(s): Monitored during session;Repositioned;Ice applied    Home Living                      Prior Function            PT Goals (current goals can now be found in the care plan section) Progress towards PT goals: Progressing toward goals    Frequency           PT Plan      Co-evaluation              AM-PAC PT "6 Clicks" Mobility   Outcome Measure                   End of Session Equipment Utilized During Treatment: Gait belt Activity Tolerance: Patient tolerated treatment well Patient left: in chair;with call  bell/phone within reach Nurse Communication: Mobility status       Time: [id number] PT Time Calculation (min) (ACUTE ONLY): 25 min  Charges:  $Gait Training: 8-22 mins $Therapeutic Exercise: 8-22 mins                     Rica Koyanagi  PTA Acute  Rehabilitation Services Pager      [phone] Office      [phone]

## 2019-04-24 ENCOUNTER — Telehealth: Payer: Self-pay | Admitting: Radiology

## 2019-04-24 NOTE — Telephone Encounter (Signed)
Patient's son called wanting to know patient's next appt date. She is post total knee replacement. He states that she is currently at [facility] and she called him this morning with chest pain and her knee hurting. He called the nurse there and was advised they gave patient a nitroglycerin and she was better.  The son expressed concern of the patient being sent home too soon or before she is ready just because she says that she wants to go home. I explained that she would come in for her first post op appointment and that would be discussed. I also advised Dr. Ninfa Linden would not send patient to a home where she would be left alone if he did not feel patient would be safe to be there post operatively.  Son would like for [place] to call [facility] and make post op appt for patient.

## 2019-04-24 NOTE — Telephone Encounter (Signed)
I called the [facility] and they advised that they did not have a resident of this name. So I called her son back and he advised that it was actually [facility]. He agreed that it would be best that if he or the facility scheduled his moms appointment. He will speak with facility and have them call to schedule or he will call us back to schedule.

## 2019-04-30 ENCOUNTER — Other Ambulatory Visit: Payer: Self-pay

## 2019-04-30 ENCOUNTER — Ambulatory Visit (INDEPENDENT_AMBULATORY_CARE_PROVIDER_SITE_OTHER): Payer: Medicare Other | Admitting: Orthopaedic Surgery

## 2019-04-30 ENCOUNTER — Encounter: Payer: Self-pay | Admitting: Orthopaedic Surgery

## 2019-04-30 DIAGNOSIS — Z96651 Presence of right artificial knee joint: Secondary | ICD-10-CM

## 2019-04-30 NOTE — Progress Notes (Signed)
HPI: [name] returns today 2 weeks status post right total knee arthroplasty.  She states the knee is overall doing well.  She is developing a healthcare facility and is making progress with PT.  Her only complaint is she feels like she is dehydrated.  Also reports cloudy urine, some burning with urination and a sensation of being somewhat dizzy or lightheaded at times.  She states that this resolves with drinking water but her water is somewhat limited very much today at the facility.  Physical exam General patient looks well-developed well-nourished female no acute distress mood affect appropriate.  Psych: Alert and oriented x3 Right knee surgical incisions well approximated staples no signs of infection.  Calf supple minimal tenderness.  No significant edema of the right lower leg.  Dorsiflexion plantarflexion ankle intact.  Full extension and flexion to 95 degrees.  Slight opening medially with valgus stressing..  Impression: Status post right total knee arthroplasty 04/13/2019  Plan: She will continue to work on range of motion strengthening the knee.  She can get back on her 81 mg aspirin once daily as this is what she was taking prior to her surgery.  Staples removed Steri-Strips applied.  She is able to get the incision wet in shower.  In regards to her dizziness dysuria and cloudy urine fatigue check with her primary care physician and also at the facility and see if labs can be ordered such as a urinalysis hemoglobin and hematocrit.  We will set her up for outpatient therapy which she will commence once she is discharged from the skilled facility.  Questions encouraged and answered with patient and her son who is present today.

## 2019-05-07 ENCOUNTER — Other Ambulatory Visit: Payer: Self-pay

## 2019-05-07 ENCOUNTER — Ambulatory Visit (HOSPITAL_COMMUNITY)
Admission: RE | Admit: 2019-05-07 | Discharge: 2019-05-07 | Disposition: A | Discharge status: Home / Self Care | Payer: Medicare Other | Source: Ambulatory Visit | Attending: Family | Admitting: Family

## 2019-05-07 ENCOUNTER — Other Ambulatory Visit (HOSPITAL_COMMUNITY): Payer: Self-pay | Admitting: Internal Medicine

## 2019-05-07 DIAGNOSIS — M7989 Other specified soft tissue disorders: Secondary | ICD-10-CM | POA: Insufficient documentation

## 2019-05-10 ENCOUNTER — Encounter: Payer: Self-pay | Admitting: Orthopaedic Surgery

## 2019-05-10 ENCOUNTER — Other Ambulatory Visit: Payer: Self-pay

## 2019-05-10 ENCOUNTER — Ambulatory Visit (INDEPENDENT_AMBULATORY_CARE_PROVIDER_SITE_OTHER): Payer: Medicare Other | Admitting: Orthopaedic Surgery

## 2019-05-10 DIAGNOSIS — Z96651 Presence of right artificial knee joint: Secondary | ICD-10-CM

## 2019-05-10 NOTE — Progress Notes (Signed)
The patient is a 85 year old female who is getting close to a month out from a right total knee arthroplasty.  She says other than foot and ankle swelling she is doing well.  She ambulates with a walker.  She did have a ultrasound to make sure she did not have a DVT and this was negative.  She is on aspirin a day.  On exam her extension is full on her right operative knee and her flexion is full.  The knee feels loose is stable.  She states she is having no pain.  She does state that the swelling stays in her foot even throughout the night on her right operative side.  Her calf is soft on my exam today and her foot is well-perfused.  I do feel that she would benefit from bilateral lower extremity compressive garments and I gave her a prescription to get this at a medical supply store with compression of 20 to 30 mm of pressure with kneehighs.  She would like to try this as well.  We will see her back in 4 weeks to see how she is doing overall but no x-rays are needed.  If she looks good at that visit we do not need to see her back for 6 months or more.

## 2019-05-30 ENCOUNTER — Ambulatory Visit (INDEPENDENT_AMBULATORY_CARE_PROVIDER_SITE_OTHER): Payer: Medicare Other | Admitting: Orthopaedic Surgery

## 2019-05-30 ENCOUNTER — Other Ambulatory Visit: Payer: Self-pay

## 2019-05-30 ENCOUNTER — Encounter: Payer: Self-pay | Admitting: Orthopaedic Surgery

## 2019-05-30 DIAGNOSIS — Z96651 Presence of right artificial knee joint: Secondary | ICD-10-CM

## 2019-05-30 NOTE — Progress Notes (Signed)
The patient is now 6 weeks status post a right total knee arthroplasty.  She is 85 years old.  She is only concerned about her scar because he does have a keloid.  She is using her walker to get around.  She is done with physical therapy states.  She has minimal pain and says she is getting around well.  On exam her extension is almost full and her flexion is almost full.  There is a prominent keloid but overall the knee looks good.  There is mild swelling around the knee which is to be expected at this time postoperative.  She is doing well enough that we do not need to see her back for 3 months.  At that visit I would like an AP and lateral of her right operative knee.  All question concerns were answered and addressed.

## 2019-06-07 ENCOUNTER — Ambulatory Visit: Payer: Medicare Other | Admitting: Orthopaedic Surgery

## 2019-06-18 ENCOUNTER — Other Ambulatory Visit: Payer: Self-pay

## 2019-06-18 ENCOUNTER — Encounter: Payer: Self-pay | Admitting: Cardiovascular Disease

## 2019-06-18 ENCOUNTER — Ambulatory Visit (INDEPENDENT_AMBULATORY_CARE_PROVIDER_SITE_OTHER): Payer: Medicare Other | Admitting: Cardiovascular Disease

## 2019-06-18 VITALS — BP 132/76 | HR 75 | Ht 60.0 in | Wt 147.0 lb

## 2019-06-18 DIAGNOSIS — I2510 Atherosclerotic heart disease of native coronary artery without angina pectoris: Secondary | ICD-10-CM | POA: Diagnosis not present

## 2019-06-18 DIAGNOSIS — I10 Essential (primary) hypertension: Secondary | ICD-10-CM

## 2019-06-18 DIAGNOSIS — E871 Hypo-osmolality and hyponatremia: Secondary | ICD-10-CM | POA: Diagnosis not present

## 2019-06-18 NOTE — Patient Instructions (Signed)
Medication Instructions:  Your physician recommends that you continue on your current medications as directed. Please refer to the Current Medication list given to you today.  *If you need a refill on your cardiac medications before your next appointment, please call your pharmacy*   Lab Work: None Ordered    Testing/Procedures: None Ordered    Follow-Up: At [facility], you and your health needs are our priority.  As part of our continuing mission to provide you with exceptional heart care, we have created designated Provider Care Teams.  These Care Teams include your primary Cardiologist (physician) and Advanced Practice Providers (APPs -  Physician Assistants and Nurse Practitioners) who all work together to provide you with the care you need, when you need it.  Your next appointment:   1 year(s)  The format for your next appointment:   In Person  Provider:   You may see Mertie Moores, MD or one of the following Advanced Practice Providers on your designated Care Team:    Richardson Dopp, PA-C  [place], [place]  Daune Perch, [place]

## 2019-06-18 NOTE — Progress Notes (Signed)
Cardiology Office Note   Date:  06/18/2019   ID:  Ciniyah, Tella September 28, 1931, MRN [id number]  PCP:  Marton Redwood, MD  Cardiologist:   Mertie Moores, MD   No chief complaint on file.  1. Minimal coronary artery disease 2. Hyperlipidemia 3. Chronic cough 4. Hypothyroidism 5. Status post Nissen fundoplication for acid reflux 6. Hypertension  Previous Notes.   Ersa is an elderly female with a history of chest pain with minimal coronary artery disease. She has a history of hyperlipidemia chronic cough she status post Nissen fundoplication for acid reflux. She has a history of hypertension.  She's continued to have some episodes of chest discomfort. These are fairly atypical.  Dec. 4, 2013: She has been having upper abdominal / lower chest discomfort pain. The pain is eased with NTG. She does not think the symptoms are due to soft reflux. She stopped her proton pump inhibitor last year. She was concerned about side effects including osteoporosis.  Sept. 12, 2014:  She is doing well from a cardiac standpoint. She continues to have problems with GERD. She has had a nissan fundiplication - helped the cough but now she had recurrent GERD.   She has been gaining weight. Has some numbness on the lateral aspect of her right legs.   November 14, 2013:  She was recently hospitalized for generalized weakness and coughing. She was found to have hyponatremia. She has been feeling well. She bought some compression hose but they were too long ( ended at the bend of her knee) and she could not wear them. She is going to go back to the supply store to get a shorter pair. She denies any CP or dyspnea.   Oct. 26, 2015:  Icesis is doing well.  No CP or dyspnea.   November 12, 2014:   Mallorey Gerelene Seja is a 85 y.o. female who presents for follow up of her HTN Has occasional palpitations.  Has had some leg pain  - especially in the morning.  Not worsened  with exercise .   Oct. 26, 2016:  Doing well from a cardiac stnandpoint.    November 10, 2015: Complains of being cold No CP or dyspnea.   Oct. 25, 2017:  Trinna is seen today for follow up visit  Has a history of minimal coronary artery disease, hypertension and hyperlipidemia.  Had some left sided chest pain this weekend. Took 2 SL NTG - relief of the pain in 1/2 hour. She originally thought it was indigestion,   Tried a pepcid complete  Had a cath in May that revealed only minimal CAD BP is high today  Did not take her BP meds this am.    June 18, 2019: Mrs. Saxton seen today for a follow-up of her coronary artery disease, hypertension, hyperlipidemia.. Doing well   .  No cp and no dyspnea.  Has a frequent cough.  VS look good .     Past Medical History:  Diagnosis Date  . Anemia 06/02/2012  . Anginal pain ([place])    complained of chest pain last week lasting momentarily week of 04/02/2019 took reflux medication, then took a nitro and laid down and got relief  . Anxiety   . Arthritis   . CAD (coronary artery disease)    a. 2005 small inferoapical MI --> medical therapy b. 2007 - nomral coronaries c. 11/2015 mid to distal LAD 20% stenosis, 1st diag 30%, and 50%,--> medical therapy    . Cataracts, bilateral  09/11/2011   Per report of pt.  . Esophageal abnormality 09/11/2011   Surgically treated per pt.  Marland Kitchen GERD (gastroesophageal reflux disease)   . Headache(784.0)   . Hypercholesterolemia 09/11/2011  . Hyperlipemia   . Hypertension   . Hypothyroid 09/11/2011  . Hypothyroidism   . Insect bite of ankle, left 09/11/2011   back of ankle  . Kidney mass 09/11/2011   Per report of pt  . Leukocytopenia   . Liver mass 09/11/2011   Per report of pt  . Mental health disorder   . Myocardial infarction ([place]) 09/11/2011   Per pt in 2005 (small vessel)  . Osteoporosis 09/11/2011  . Pneumonia   . Thigh cramp 09/11/2011   Left thigh x 3 nights since starting unspecified  psychotropic medication    Past Surgical History:  Procedure Laterality Date  . ABDOMINAL HYSTERECTOMY    . APPENDECTOMY    . BREAST BIOPSY     biopsy on R breast (benign)  . BREAST EXCISIONAL BIOPSY    . CARDIAC CATHETERIZATION N/A 12/10/2015   Procedure: Left Heart Cath and Coronary Angiography;  Surgeon: Burnell Blanks, MD;  Location: [facility];  Service: Cardiovascular;  Laterality: N/A;  . CARDIOVASCULAR STRESS TEST  08-15-2007   EF 59%  . CHOLECYSTECTOMY OPEN    . GALLBLADDER SURGERY  09/11/2011  . HYSTEROTOMY    . KNEE SURGERY  09/11/2011  . SHOULDER SURGERY  09/11/2011   Right side  . TONSILLECTOMY    . TOTAL KNEE ARTHROPLASTY Right 04/13/2019   Procedure: RIGHT TOTAL KNEE ARTHROPLASTY;  Surgeon: Mcarthur Rossetti, MD;  Location: [facility];  Service: Orthopedics;  Laterality: Right;  . US ECHOCARDIOGRAPHY  11/25/2015   Est EF 55-60%     Current Outpatient Medications  Medication Sig Dispense Refill  . acetaminophen (TYLENOL) 500 MG tablet Take 1,000 mg by mouth every 6 (six) hours as needed for moderate pain.     Marland Kitchen amLODipine (NORVASC) 5 MG tablet Take 5 mg by mouth daily.     Marland Kitchen aspirin 81 MG chewable tablet Chew 81 mg by mouth daily.    . Cholecalciferol 250 MCG (10000 UT) TABS Take 1,000 Units by mouth daily.     . cloNIDine (CATAPRES) 0.1 MG tablet Take 0.1 mg by mouth daily.     Marland Kitchen docusate sodium (COLACE) 100 MG capsule Take 100-200 mg by mouth daily as needed for moderate constipation.     . famotidine (PEPCID) 10 MG tablet Take 10 mg by mouth daily.     . hydrochlorothiazide (HYDRODIURIL) 12.5 MG tablet Take 12.5 mg by mouth daily.     Marland Kitchen HYDROcodone-acetaminophen (NORCO/VICODIN) 5-325 MG tablet Take 1-2 tablets by mouth every 4 (four) hours as needed for moderate pain (pain score 4-6). 40 tablet 0  . levothyroxine (SYNTHROID, LEVOTHROID) 112 MCG tablet Take 112 mcg by mouth daily before breakfast.     . LINZESS 145 MCG CAPS capsule Take 145 mcg by  mouth daily.     Marland Kitchen loratadine (CLARITIN) 10 MG tablet Take 10 mg by mouth daily.    . Multiple Vitamin (MULTIVITAMIN WITH MINERALS) TABS tablet Take 1 tablet by mouth daily.    . nitroGLYCERIN (NITROSTAT) 0.4 MG SL tablet Place 1 tablet (0.4 mg total) under the tongue every 5 (five) minutes x 3 doses as needed for chest pain. 25 tablet 3  . OLANZapine (ZYPREXA) 2.5 MG tablet Take 2.5 mg by mouth at bedtime.    Marland Kitchen olmesartan (BENICAR) 40  MG tablet Take 40 mg by mouth daily.    . polyethylene glycol (MIRALAX) packet Take 17 g by mouth 2 (two) times daily. 60 packet 6  . potassium chloride SA (K-DUR,KLOR-CON) 20 MEQ tablet Take 20 mEq by mouth daily.    . Probiotic Product (ALIGN) 4 MG CAPS Take 4 mg by mouth daily.    . simvastatin (ZOCOR) 10 MG tablet Take 5 mg by mouth daily.    . traZODone (DESYREL) 150 MG tablet Take 225 mg by mouth at bedtime.    . vitamin C (ASCORBIC ACID) 500 MG tablet Take 500 mg by mouth daily.     No current facility-administered medications for this visit.     Allergies:   Statins, Cholestatin, Rosuvastatin, Doxycycline, and Prednisone    Social History:  The patient  reports that she has never smoked. She has never used smokeless tobacco. She reports that she does not drink alcohol or use drugs.   Family History:  The patient's family history includes Hypertension in her brother and sister.    ROS:  Please see the history of present illness.      All other systems are reviewed and negative.   Physical Exam: Blood pressure 132/76, pulse 75, height 5' (1.524 m), weight 147 lb (66.7 kg), SpO2 97 %.  GEN:  Well nourished, well developed in no acute distress HEENT: Normal NECK: No JVD; No carotid bruits LYMPHATICS: No lymphadenopathy CARDIAC: RRR ,  Soft  systolic murmur  RESPIRATORY:  Clear to auscultation without rales, wheezing or rhonchi  ABDOMEN: Soft, non-tender, non-distended MUSCULOSKELETAL:  Right lower leg edema ( from knee replacement .  SKIN:  Warm and dry NEUROLOGIC:  Alert and oriented x 3   EKG:    Recent Labs: 04/16/2019: BUN 7; Creatinine, Ser 0.59; Hemoglobin 9.0; Platelets 141; Potassium 3.9; Sodium 127    Lipid Panel    Component Value Date/Time   CHOL 193 05/13/2014 0757   TRIG 58.0 05/13/2014 0757   HDL 74.60 05/13/2014 0757   CHOLHDL 3 05/13/2014 0757   VLDL 11.6 05/13/2014 0757   LDLCALC 107 (H) 05/13/2014 0757   LDLDIRECT 109.5 03/29/2011 1007      Wt Readings from Last 3 Encounters:  06/18/19 147 lb (66.7 kg)  04/13/19 150 lb (68 kg)  04/11/19 150 lb (68 kg)      Other studies Reviewed: Additional studies/ records that were reviewed today include: . Review of the above records demonstrates:    ASSESSMENT AND PLAN:  1. Minimal coronary artery disease by cath in May, 2017 no angina,   Overall has been very stable   2. Hyperlipidemia -  Managed by dr. Brigitte Pulse.   Will see him again in Feb.    3. Chronic cough:   No changes.   4. Hypothyroidism 5. Status post Nissen fundoplication for acid reflux  6. Hypertension -  BP is well controlled.   Current medicines are reviewed at length with the patient today.  The patient does not have concerns regarding medicines.  The following changes have been made:  no change  Labs/ tests ordered today include:  No orders of the defined types were placed in this encounter.   Disposition:   FU with me in 6 months     Signed, Mertie Moores, MD  06/18/2019 9:56 AM    [facility] [address], [address], [place]  [address] Phone: [phone]; Fax: [phone]

## 2019-07-30 ENCOUNTER — Other Ambulatory Visit: Payer: Self-pay | Admitting: Internal Medicine

## 2019-07-30 DIAGNOSIS — Z1231 Encounter for screening mammogram for malignant neoplasm of breast: Secondary | ICD-10-CM

## 2019-08-30 ENCOUNTER — Ambulatory Visit (INDEPENDENT_AMBULATORY_CARE_PROVIDER_SITE_OTHER): Payer: Medicare Other | Admitting: Orthopaedic Surgery

## 2019-08-30 ENCOUNTER — Other Ambulatory Visit: Payer: Self-pay

## 2019-08-30 ENCOUNTER — Ambulatory Visit: Payer: Self-pay

## 2019-08-30 ENCOUNTER — Encounter: Payer: Self-pay | Admitting: Orthopaedic Surgery

## 2019-08-30 DIAGNOSIS — Z96651 Presence of right artificial knee joint: Secondary | ICD-10-CM | POA: Diagnosis not present

## 2019-08-30 MED ORDER — TRAMADOL HCL 50 MG PO TABS: 50.0000 mg | ORAL_TABLET | Freq: Every evening | ORAL | 0 refills | Status: DC | PRN

## 2019-08-30 NOTE — Progress Notes (Signed)
The patient is an 86 year old female who is now 4 months out from a right total knee arthroplasty.  She is still having problems with bilateral knee pain and having difficulty sleeping at night.  She ambulates with a walker.  She actually turns 88 next month.  On exam both knees actually move well.  They are both feel ligamentously stable.  Her right knee was done when she was in her 79s.  An AP and lateral of the right knee that we did more recent shows a well-seated total knee arthroplasty with no complicating features.  An AP view also shows her left total knee.  There is narrowing due to polyethylene liner wear.  I would like to try tramadol for her at night to see if that will help decrease her pain at nighttime and get her more rest.  Also want her to try Voltaren gel for both her knees.  She is not interested in any other surgical intervention which I agree with for now.  We can see her back in 6 months to see how she is doing overall but no x-rays needed.

## 2019-09-07 ENCOUNTER — Ambulatory Visit: Payer: Medicare Other

## 2019-09-07 ENCOUNTER — Other Ambulatory Visit: Payer: Self-pay

## 2019-09-07 ENCOUNTER — Ambulatory Visit
Admission: RE | Admit: 2019-09-07 | Discharge: 2019-09-07 | Disposition: A | Discharge status: Home / Self Care | Payer: Medicare Other | Source: Ambulatory Visit | Attending: Internal Medicine | Admitting: Internal Medicine

## 2019-09-07 DIAGNOSIS — Z1231 Encounter for screening mammogram for malignant neoplasm of breast: Secondary | ICD-10-CM

## 2019-09-21 ENCOUNTER — Encounter (HOSPITAL_COMMUNITY): Payer: Self-pay | Admitting: Emergency Medicine

## 2019-09-21 ENCOUNTER — Observation Stay (HOSPITAL_COMMUNITY)
Admission: EM | Admit: 2019-09-21 | Discharge: 2019-09-22 | Disposition: A | Discharge status: Home / Self Care | Payer: Medicare Other | Attending: Family Medicine | Admitting: Family Medicine

## 2019-09-21 ENCOUNTER — Other Ambulatory Visit: Payer: Self-pay

## 2019-09-21 ENCOUNTER — Emergency Department (HOSPITAL_COMMUNITY): Payer: Medicare Other

## 2019-09-21 DIAGNOSIS — E039 Hypothyroidism, unspecified: Secondary | ICD-10-CM | POA: Diagnosis present

## 2019-09-21 DIAGNOSIS — M25512 Pain in left shoulder: Secondary | ICD-10-CM | POA: Diagnosis not present

## 2019-09-21 DIAGNOSIS — E785 Hyperlipidemia, unspecified: Secondary | ICD-10-CM | POA: Insufficient documentation

## 2019-09-21 DIAGNOSIS — E871 Hypo-osmolality and hyponatremia: Secondary | ICD-10-CM | POA: Diagnosis present

## 2019-09-21 DIAGNOSIS — Z7982 Long term (current) use of aspirin: Secondary | ICD-10-CM | POA: Insufficient documentation

## 2019-09-21 DIAGNOSIS — R079 Chest pain, unspecified: Secondary | ICD-10-CM | POA: Diagnosis not present

## 2019-09-21 DIAGNOSIS — M25511 Pain in right shoulder: Secondary | ICD-10-CM | POA: Diagnosis not present

## 2019-09-21 DIAGNOSIS — F32A Depression, unspecified: Secondary | ICD-10-CM | POA: Diagnosis present

## 2019-09-21 DIAGNOSIS — Z20822 Contact with and (suspected) exposure to covid-19: Secondary | ICD-10-CM | POA: Diagnosis not present

## 2019-09-21 DIAGNOSIS — M549 Dorsalgia, unspecified: Secondary | ICD-10-CM | POA: Insufficient documentation

## 2019-09-21 DIAGNOSIS — I119 Hypertensive heart disease without heart failure: Secondary | ICD-10-CM | POA: Insufficient documentation

## 2019-09-21 DIAGNOSIS — F329 Major depressive disorder, single episode, unspecified: Secondary | ICD-10-CM | POA: Diagnosis present

## 2019-09-21 DIAGNOSIS — Z79899 Other long term (current) drug therapy: Secondary | ICD-10-CM | POA: Insufficient documentation

## 2019-09-21 DIAGNOSIS — I2510 Atherosclerotic heart disease of native coronary artery without angina pectoris: Secondary | ICD-10-CM | POA: Diagnosis present

## 2019-09-21 DIAGNOSIS — K219 Gastro-esophageal reflux disease without esophagitis: Secondary | ICD-10-CM | POA: Diagnosis not present

## 2019-09-21 DIAGNOSIS — Z79890 Hormone replacement therapy (postmenopausal): Secondary | ICD-10-CM | POA: Diagnosis not present

## 2019-09-21 DIAGNOSIS — D509 Iron deficiency anemia, unspecified: Secondary | ICD-10-CM | POA: Diagnosis present

## 2019-09-21 DIAGNOSIS — K5909 Other constipation: Secondary | ICD-10-CM | POA: Diagnosis not present

## 2019-09-21 DIAGNOSIS — E7800 Pure hypercholesterolemia, unspecified: Secondary | ICD-10-CM | POA: Insufficient documentation

## 2019-09-21 DIAGNOSIS — D72819 Decreased white blood cell count, unspecified: Secondary | ICD-10-CM | POA: Diagnosis present

## 2019-09-21 DIAGNOSIS — I252 Old myocardial infarction: Secondary | ICD-10-CM | POA: Insufficient documentation

## 2019-09-21 DIAGNOSIS — Z96653 Presence of artificial knee joint, bilateral: Secondary | ICD-10-CM | POA: Insufficient documentation

## 2019-09-21 DIAGNOSIS — I10 Essential (primary) hypertension: Secondary | ICD-10-CM | POA: Diagnosis present

## 2019-09-21 LAB — CBC
HCT: 33.1 % — ABNORMAL LOW (ref 36.0–46.0)
HCT: 32.9 % — ABNORMAL LOW (ref 36.0–46.0)
Hemoglobin: 10.4 g/dL — ABNORMAL LOW (ref 12.0–15.0)
Hemoglobin: 10.5 g/dL — ABNORMAL LOW (ref 12.0–15.0)
MCH: 24 pg — ABNORMAL LOW (ref 26.0–34.0)
MCH: 23.9 pg — ABNORMAL LOW (ref 26.0–34.0)
MCHC: 31.4 g/dL (ref 30.0–36.0)
MCHC: 31.9 g/dL (ref 30.0–36.0)
MCV: 76.3 fL — ABNORMAL LOW (ref 80.0–100.0)
MCV: 74.8 fL — ABNORMAL LOW (ref 80.0–100.0)
Platelets: 193 10*3/uL (ref 150–400)
Platelets: 204 10*3/uL (ref 150–400)
RBC: 4.34 MIL/uL (ref 3.87–5.11)
RBC: 4.4 MIL/uL (ref 3.87–5.11)
RDW: 15.3 % (ref 11.5–15.5)
RDW: 15.2 % (ref 11.5–15.5)
WBC: 2.7 10*3/uL — ABNORMAL LOW (ref 4.0–10.5)
WBC: 2.8 10*3/uL — ABNORMAL LOW (ref 4.0–10.5)
nRBC: 0 % (ref 0.0–0.2)
nRBC: 0 % (ref 0.0–0.2)

## 2019-09-21 LAB — BASIC METABOLIC PANEL
Anion gap: 8 (ref 5–15)
BUN: 8 mg/dL (ref 8–23)
CO2: 30 mmol/L (ref 22–32)
Calcium: 9.3 mg/dL (ref 8.9–10.3)
Chloride: 92 mmol/L — ABNORMAL LOW (ref 98–111)
Creatinine, Ser: 0.79 mg/dL (ref 0.44–1.00)
GFR calc Af Amer: >60 mL/min (ref >60)
GFR calc non Af Amer: >60 mL/min (ref >60)
Glucose, Bld: 101 mg/dL — ABNORMAL HIGH (ref 70–99)
Potassium: 4.5 mmol/L (ref 3.5–5.1)
Sodium: 130 mmol/L — ABNORMAL LOW (ref 135–145)

## 2019-09-21 LAB — MAGNESIUM: Magnesium: 2 mg/dL (ref 1.7–2.4)

## 2019-09-21 LAB — LIPID PANEL
Cholesterol: 159 mg/dL (ref 0–200)
HDL: 70 mg/dL (ref >40)
LDL Cholesterol: 83 mg/dL (ref 0–99)
Total CHOL/HDL Ratio: 2.3 RATIO
Triglycerides: 29 mg/dL (ref <150)
VLDL: 6 mg/dL (ref 0–40)

## 2019-09-21 LAB — CREATININE, SERUM
Creatinine, Ser: 0.75 mg/dL (ref 0.44–1.00)
GFR calc Af Amer: >60 mL/min (ref >60)
GFR calc non Af Amer: >60 mL/min (ref >60)

## 2019-09-21 LAB — SARS CORONAVIRUS 2 (TAT 6-24 HRS): SARS Coronavirus 2: NEGATIVE

## 2019-09-21 LAB — TROPONIN I (HIGH SENSITIVITY)
Troponin I (High Sensitivity): 3 ng/L (ref <18)
Troponin I (High Sensitivity): 4 ng/L (ref <18)
Troponin I (High Sensitivity): 4 ng/L (ref <18)

## 2019-09-21 MED ORDER — LEVOTHYROXINE SODIUM 112 MCG PO TABS
112.0000 ug | ORAL_TABLET | Freq: Every day | ORAL | Status: DC
  Administered 2019-09-22: 112 ug via ORAL
  Filled 2019-09-21: qty 1

## 2019-09-21 MED ORDER — SODIUM CHLORIDE 0.9 % IV BOLUS
500.0000 mL | Freq: Once | INTRAVENOUS | Status: AC
  Administered 2019-09-21: 500 mL via INTRAVENOUS

## 2019-09-21 MED ORDER — CLONIDINE HCL 0.1 MG PO TABS
0.1000 mg | ORAL_TABLET | Freq: Every day | ORAL | Status: DC
  Administered 2019-09-22: 0.1 mg via ORAL
  Filled 2019-09-21: qty 1

## 2019-09-21 MED ORDER — NITROGLYCERIN 0.4 MG SL SUBL: 0.4000 mg | SUBLINGUAL_TABLET | SUBLINGUAL | Status: DC | PRN

## 2019-09-21 MED ORDER — TRAMADOL HCL 50 MG PO TABS
50.0000 mg | ORAL_TABLET | Freq: Every evening | ORAL | Status: DC | PRN
  Administered 2019-09-21: 100 mg via ORAL
  Filled 2019-09-21: qty 2

## 2019-09-21 MED ORDER — OLANZAPINE 2.5 MG PO TABS
2.5000 mg | ORAL_TABLET | Freq: Every day | ORAL | Status: DC
  Administered 2019-09-21: 2.5 mg via ORAL
  Filled 2019-09-21 (×2): qty 1

## 2019-09-21 MED ORDER — IRBESARTAN 300 MG PO TABS
300.0000 mg | ORAL_TABLET | Freq: Every day | ORAL | Status: DC
  Administered 2019-09-22: 300 mg via ORAL
  Filled 2019-09-21: qty 1

## 2019-09-21 MED ORDER — TRAZODONE HCL 150 MG PO TABS
225.0000 mg | ORAL_TABLET | Freq: Every day | ORAL | Status: DC
  Administered 2019-09-21: 225 mg via ORAL
  Filled 2019-09-21: qty 2

## 2019-09-21 MED ORDER — SIMVASTATIN 5 MG PO TABS
5.0000 mg | ORAL_TABLET | Freq: Every day | ORAL | Status: DC
  Filled 2019-09-21: qty 1

## 2019-09-21 MED ORDER — LINACLOTIDE 145 MCG PO CAPS
145.0000 ug | ORAL_CAPSULE | Freq: Every day | ORAL | Status: DC
  Administered 2019-09-22: 145 ug via ORAL
  Filled 2019-09-21: qty 1

## 2019-09-21 MED ORDER — AMLODIPINE BESYLATE 5 MG PO TABS
5.0000 mg | ORAL_TABLET | Freq: Every day | ORAL | Status: DC
  Administered 2019-09-22: 5 mg via ORAL
  Filled 2019-09-21: qty 1

## 2019-09-21 MED ORDER — ROSUVASTATIN CALCIUM 5 MG PO TABS: 10.0000 mg | ORAL_TABLET | Freq: Every day | ORAL | Status: DC

## 2019-09-21 MED ORDER — FAMOTIDINE 20 MG PO TABS
10.0000 mg | ORAL_TABLET | Freq: Every day | ORAL | Status: DC
  Administered 2019-09-22: 10 mg via ORAL
  Filled 2019-09-21: qty 1

## 2019-09-21 MED ORDER — MORPHINE SULFATE (PF) 2 MG/ML IV SOLN
2.0000 mg | INTRAVENOUS | Status: DC | PRN
  Administered 2019-09-21 – 2019-09-22 (×2): 2 mg via INTRAVENOUS
  Filled 2019-09-21 (×2): qty 1

## 2019-09-21 MED ORDER — ASPIRIN 81 MG PO CHEW
81.0000 mg | CHEWABLE_TABLET | Freq: Every day | ORAL | Status: DC
  Administered 2019-09-22: 81 mg via ORAL
  Filled 2019-09-21: qty 1

## 2019-09-21 MED ORDER — HYDROCHLOROTHIAZIDE 25 MG PO TABS
12.5000 mg | ORAL_TABLET | Freq: Every day | ORAL | Status: DC
  Administered 2019-09-21: 12.5 mg via ORAL
  Filled 2019-09-21: qty 1

## 2019-09-21 MED ORDER — ACETAMINOPHEN 325 MG PO TABS: 650.0000 mg | ORAL_TABLET | ORAL | Status: DC | PRN

## 2019-09-21 MED ORDER — ONDANSETRON HCL 4 MG/2ML IJ SOLN: 4.0000 mg | Freq: Four times a day (QID) | INTRAMUSCULAR | Status: DC | PRN

## 2019-09-21 MED ORDER — ENOXAPARIN SODIUM 40 MG/0.4ML ~~LOC~~ SOLN
40.0000 mg | SUBCUTANEOUS | Status: DC
  Administered 2019-09-21: 40 mg via SUBCUTANEOUS
  Filled 2019-09-21: qty 0.4

## 2019-09-21 NOTE — ED Triage Notes (Signed)
Pt in from home via [facility] with central cp that began 1 hr PTA. Pt states she was just sitting on her sofa when sharp central pain began. Denies any pain radiation or n/v. 2 NTG given by EMS, no relief. 324mg  ASA given and took pain to 2/10. Pt reports having dealt w/muscle cramps to arms, shoulders and back recently, but this pain different per pt

## 2019-09-21 NOTE — H&P (Addendum)
History and Physical    [name] DOB: 10-Apr-1932 DOA: 09/21/2019  PCP: Marton Redwood, MD  Patient coming from: Home  I have personally briefly reviewed patient's old medical records in [place]  Chief Complaint: Chest pain  HPI: [name] is a 86 y.o. female with medical history significant of hypertension, hyperlipidemia, coronary artery disease status post LHC in 2017, hypothyroidism, bilateral osteoarthritis status post bilateral total knee replacement, depression, GERD, muscle cramps presents to emergency department with chest pain started 1 hour prior to arrival.  Patient tells me that she had lunch this afternoon and suddenly she developed chest pain.  Initially she thought that her chest pain related to GERD, she took 2 chewable Tums with no help and then she took nitro x2 with no help and then she called 911 and came to the emergency department for further evaluation and management.  Patient tells me that her chest pain is central, nonradiating, pressure-like, 10 out of 10, not associated with nausea, vomiting, diaphoresis, shortness of breath, palpitation.  She tells me that her symptoms are not as same as she had with MI about 15 years ago.  She lives alone and uses walker for ambulation.  No history of smoking, alcohol, is a drug use.  ED Course: Upon arrival: Patient vital signs stable, maintaining oxygen saturation on room air, initial troponin negative, chest x-ray negative for acute findings.  EKG: No acute findings.  [facility] hospitalist consulted for overnight observation for chest pain rule out ACS.  Review of Systems: As per HPI otherwise negative.    Past Medical History:  Diagnosis Date  . Anemia 06/02/2012  . Anginal pain ([place])    complained of chest pain last week lasting momentarily week of 04/02/2019 took reflux medication, then took a nitro and laid down and got relief  . Anxiety   . Arthritis   . CAD (coronary  artery disease)    a. 2005 small inferoapical MI --> medical therapy b. 2007 - nomral coronaries c. 11/2015 mid to distal LAD 20% stenosis, 1st diag 30%, and 50%,--> medical therapy    . Cataracts, bilateral 09/11/2011   Per report of pt.  . Esophageal abnormality 09/11/2011   Surgically treated per pt.  Marland Kitchen GERD (gastroesophageal reflux disease)   . Headache(784.0)   . Hypercholesterolemia 09/11/2011  . Hyperlipemia   . Hypertension   . Hypothyroid 09/11/2011  . Hypothyroidism   . Insect bite of ankle, left 09/11/2011   back of ankle  . Kidney mass 09/11/2011   Per report of pt  . Leukocytopenia   . Liver mass 09/11/2011   Per report of pt  . Mental health disorder   . Myocardial infarction ([place]) 09/11/2011   Per pt in 2005 (small vessel)  . Osteoporosis 09/11/2011  . Pneumonia   . Thigh cramp 09/11/2011   Left thigh x 3 nights since starting unspecified psychotropic medication    Past Surgical History:  Procedure Laterality Date  . ABDOMINAL HYSTERECTOMY    . APPENDECTOMY    . BREAST BIOPSY     biopsy on R breast (benign)  . BREAST EXCISIONAL BIOPSY    . CARDIAC CATHETERIZATION N/A 12/10/2015   Procedure: Left Heart Cath and Coronary Angiography;  Surgeon: Burnell Blanks, MD;  Location: [facility];  Service: Cardiovascular;  Laterality: N/A;  . CARDIOVASCULAR STRESS TEST  08-15-2007   EF 59%  . CHOLECYSTECTOMY OPEN    . GALLBLADDER SURGERY  09/11/2011  . HYSTEROTOMY    .  KNEE SURGERY  09/11/2011  . SHOULDER SURGERY  09/11/2011   Right side  . TONSILLECTOMY    . TOTAL KNEE ARTHROPLASTY Right 04/13/2019   Procedure: RIGHT TOTAL KNEE ARTHROPLASTY;  Surgeon: Mcarthur Rossetti, MD;  Location: [facility];  Service: Orthopedics;  Laterality: Right;  . US ECHOCARDIOGRAPHY  11/25/2015   Est EF 55-60%     reports that she has never smoked. She has never used smokeless tobacco. She reports that she does not drink alcohol or use drugs.  Allergies  Allergen Reactions  .  Statins Other (See Comments)    Muscle aches  . Cholestatin   . Rosuvastatin Other (See Comments)  . Doxycycline Itching and Rash  . Prednisone Itching    Family History  Problem Relation Age of Onset  . Hypertension Sister   . Hypertension Brother     Prior to Admission medications   Medication Sig Start Date End Date Taking? Authorizing Provider  acetaminophen (TYLENOL) 500 MG tablet Take 1,000 mg by mouth every 6 (six) hours as needed for moderate pain.     [provider]  amLODipine (NORVASC) 5 MG tablet Take 5 mg by mouth daily.  08/31/17   [provider]  aspirin 81 MG chewable tablet Chew 81 mg by mouth daily.    [provider]  Cholecalciferol 250 MCG (10000 UT) TABS Take 1,000 Units by mouth daily.     [provider]  cloNIDine (CATAPRES) 0.1 MG tablet Take 0.1 mg by mouth daily.     [provider]  docusate sodium (COLACE) 100 MG capsule Take 100-200 mg by mouth daily as needed for moderate constipation.     [provider]  famotidine (PEPCID) 10 MG tablet Take 10 mg by mouth daily.     [provider]  hydrochlorothiazide (HYDRODIURIL) 12.5 MG tablet Take 12.5 mg by mouth daily.  05/28/17   [provider]  HYDROcodone-acetaminophen (NORCO/VICODIN) 5-325 MG tablet Take 1-2 tablets by mouth every 4 (four) hours as needed for moderate pain (pain score 4-6). 04/17/19   Mcarthur Rossetti, MD  levothyroxine (SYNTHROID, LEVOTHROID) 112 MCG tablet Take 112 mcg by mouth daily before breakfast.     [provider]  LINZESS 145 MCG CAPS capsule Take 145 mcg by mouth daily.  11/22/13   [provider]  loratadine (CLARITIN) 10 MG tablet Take 10 mg by mouth daily.    [provider]  Multiple Vitamin (MULTIVITAMIN WITH MINERALS) TABS tablet Take 1 tablet by mouth daily.    [provider]  nitroGLYCERIN (NITROSTAT) 0.4 MG SL tablet Place 1 tablet (0.4 mg total) under the  tongue every 5 (five) minutes x 3 doses as needed for chest pain. 12/11/15   Cheryln Manly, NP  OLANZapine (ZYPREXA) 2.5 MG tablet Take 2.5 mg by mouth at bedtime.    [provider]  olmesartan (BENICAR) 40 MG tablet Take 40 mg by mouth daily.    [provider]  polyethylene glycol (MIRALAX) packet Take 17 g by mouth 2 (two) times daily. 10/04/13   Marton Redwood, MD  potassium chloride SA (K-DUR,KLOR-CON) 20 MEQ tablet Take 20 mEq by mouth daily.    [provider]  Probiotic Product (ALIGN) 4 MG CAPS Take 4 mg by mouth daily.    [provider]  simvastatin (ZOCOR) 10 MG tablet Take 5 mg by mouth daily. 11/08/18   [provider]  traMADol (ULTRAM) 50 MG tablet Take 1-2 tablets (50-100 mg total)  by mouth at bedtime as needed. 08/30/19   Mcarthur Rossetti, MD  traZODone (DESYREL) 150 MG tablet Take 225 mg by mouth at bedtime.    [provider]  vitamin C (ASCORBIC ACID) 500 MG tablet Take 500 mg by mouth daily.    [provider]    Physical Exam: Vitals:   09/21/19 1327 09/21/19 1330 09/21/19 1345 09/21/19 1400  BP:  127/71 133/71 (!) 141/70  Pulse:  62 62 83  Resp:  18 20 (!) 25  Temp: 98.5 F (36.9 C)     TempSrc: Oral     SpO2:  99% 100% 93%  Weight:        Constitutional: NAD, calm, comfortable Eyes: PERRL, lids and conjunctivae normal ENMT: Mucous membranes are moist. Posterior pharynx clear of any exudate or lesions.Normal dentition.  Neck: normal, supple, no masses, no thyromegaly Respiratory: clear to auscultation bilaterally, no wheezing, no crackles. Normal respiratory effort. No accessory muscle use.  Cardiovascular: Regular rate and rhythm, no murmurs / rubs / gallops. No extremity edema. 2+ pedal pulses. No carotid bruits.  Abdomen: no tenderness, no masses palpated. No hepatosplenomegaly. Bowel sounds positive.  Musculoskeletal: no clubbing / cyanosis. No joint deformity upper and lower  extremities. Good ROM, no contractures. Normal muscle tone.  Skin: no rashes, lesions, ulcers. No induration Neurologic: CN 2-12 grossly intact. Sensation intact, DTR normal. Strength 5/5 in all 4.  Psychiatric: Normal judgment and insight. Alert and oriented x 3. Normal mood.    Labs on Admission: I have personally reviewed following labs and imaging studies  CBC: Recent Labs  Lab 09/21/19 1349  WBC 2.7*  HGB 10.4*  HCT 33.1*  MCV 76.3*  PLT 0000000   Basic Metabolic Panel: Recent Labs  Lab 09/21/19 1349  NA 130*  K 4.5  CL 92*  CO2 30  GLUCOSE 101*  BUN 8  CREATININE 0.79  CALCIUM 9.3   GFR: Estimated Creatinine Clearance: 43.8 mL/min (by C-G formula based on SCr of 0.79 mg/dL). Liver Function Tests: No results for input(s): AST, ALT, ALKPHOS, BILITOT, PROT, ALBUMIN in the last 168 hours. No results for input(s): LIPASE, AMYLASE in the last 168 hours. No results for input(s): AMMONIA in the last 168 hours. Coagulation Profile: No results for input(s): INR, PROTIME in the last 168 hours. Cardiac Enzymes: No results for input(s): CKTOTAL, CKMB, CKMBINDEX, TROPONINI in the last 168 hours. BNP (last 3 results) No results for input(s): PROBNP in the last 8760 hours. HbA1C: No results for input(s): HGBA1C in the last 72 hours. CBG: No results for input(s): GLUCAP in the last 168 hours. Lipid Profile: No results for input(s): CHOL, HDL, LDLCALC, TRIG, CHOLHDL, LDLDIRECT in the last 72 hours. Thyroid Function Tests: No results for input(s): TSH, T4TOTAL, FREET4, T3FREE, THYROIDAB in the last 72 hours. Anemia Panel: No results for input(s): VITAMINB12, FOLATE, FERRITIN, TIBC, IRON, RETICCTPCT in the last 72 hours. Urine analysis:    Component Value Date/Time   COLORURINE YELLOW 10/02/2013 2006   APPEARANCEUR CLEAR 10/02/2013 2006   LABSPEC 1.006 10/02/2013 2006   PHURINE 5.5 10/02/2013 2006   GLUCOSEU NEGATIVE 10/02/2013 2006   HGBUR TRACE (A) 10/02/2013 2006    BILIRUBINUR NEGATIVE 10/02/2013 2006   KETONESUR 15 (A) 10/02/2013 2006   PROTEINUR NEGATIVE 10/02/2013 2006   UROBILINOGEN 0.2 10/02/2013 2006   NITRITE NEGATIVE 10/02/2013 2006   LEUKOCYTESUR NEGATIVE 10/02/2013 2006    Radiological Exams on Admission: DG Chest Port 1 View  Result Date: 09/21/2019 CLINICAL DATA:  Chest pain. EXAM: PORTABLE CHEST 1 VIEW COMPARISON:  PA and lateral chest 12/08/2015. FINDINGS: Lungs clear. Mild cardiomegaly. Atherosclerosis. No pneumothorax or pleural effusion. IMPRESSION: No acute disease. Mild cardiomegaly. Atherosclerosis. Electronically Signed   By: Inge Rise M.D.   On: 09/21/2019 13:43    EKG: Normal sinus rhythm, no ST elevation or depression noted.  Assessment/Plan Principal Problem:   Chest pain in adult Active Problems:   Hyperlipidemia   Depression   Essential hypertension, benign   GERD (gastroesophageal reflux disease)   Hypothyroidism   CAD (coronary artery disease)   Leukopenia   Microcytic anemia   Hyponatremia   Chest pain   Chest pain rule out ACS: -Presented with central chest pain, has risk factors such as hypertension, hyperlipidemia, previous history of coronary artery disease.  Initial troponin negative, trend troponin.  EKG: No ST elevation or depression. -Admit patient under observation.  On telemetry. -Nitro and morphine as needed for pain control.  On continuous pulse ox. -Continue aspirin.  Hypertension: Stable -Continue home meds of amlodipine, clonidine, HCTZ, olmesartan. -Monitor blood pressure closely.  Coronary artery disease s/p LHC in 2017 with Dr. Angelena Form which showed mild CAD with mid to distal LAD 20% stenosis, 1st diag 30%, and 50%-recommended to continue medical management. -Continue aspirin, statin, olmesartan.  Hyperlipidemia: Check lipid panel continue statin  Chronic muscle cramps: Continue tramadol as needed  Hypothyroidism: Continue levothyroxine  Microcytic anemia with  leukopenia: -H&H is stable.  No history of melena or over-the-counter use of NSAIDs.  Monitor H&H  GERD: Continue Pepcid   DVT prophylaxis:  lovenox , TED/SCD Code Status: Full code confirmed with the patient  family Communication: None present at bedside.  Plan of care discussed with patient and her son over the phone in length and they verbalized understanding and agreed with it. Disposition Plan: Likely home tomorrow a.m. Consults called: None Admission status: Observation   Mckinley Jewel MD [facility] Pager [phone]  If 7PM-7AM, please contact night-coverage [email or web address] Password [facility]  09/21/2019, 3:36 PM

## 2019-09-21 NOTE — ED Provider Notes (Signed)
[facility] EMERGENCY DEPARTMENT Provider Note   CSN: [id number] Arrival date & time: 09/21/19  1308     History Chief Complaint  Patient presents with  . Chest Pain    [name] is a 86 y.o. female with history of CAD, GERD, hypertension, hyperlipidemia.  Patient reports that approximately 1 PM today she developed substernal chest pain with a pressure sensation that was constant nonradiating no clear aggravating factors or inciting event and moderate in intensity.  She had not eaten before onset of pain.  She thought that pain may be secondary to acid reflux and took 2 unknown antacid pills without relief.  When that did not help she took 2 nitro and still had no relief.  EMS was called and she took 324 mg aspirin.  Gradually pain resolved she is currently chest pain-free.  Patient's additional concerns is generalized cramping sensation bilateral arms, legs and back ongoing for a month.  No change in the last few weeks that her cramping.  She denies any recent illness, fever/chills, headache, neck pain, cough/hemoptysis, shortness of breath, abdominal pain, nausea/vomiting, diarrhea, diaphoresis, numbness/tingling, weakness, swelling of the extremities or any additional concerns.  HPI     Past Medical History:  Diagnosis Date  . Anemia 06/02/2012  . Anginal pain ([place])    complained of chest pain last week lasting momentarily week of 04/02/2019 took reflux medication, then took a nitro and laid down and got relief  . Anxiety   . Arthritis   . CAD (coronary artery disease)    a. 2005 small inferoapical MI --> medical therapy b. 2007 - nomral coronaries c. 11/2015 mid to distal LAD 20% stenosis, 1st diag 30%, and 50%,--> medical therapy    . Cataracts, bilateral 09/11/2011   Per report of pt.  . Esophageal abnormality 09/11/2011   Surgically treated per pt.  Marland Kitchen GERD (gastroesophageal reflux disease)   . Headache(784.0)   . Hypercholesterolemia  09/11/2011  . Hyperlipemia   . Hypertension   . Hypothyroid 09/11/2011  . Hypothyroidism   . Insect bite of ankle, left 09/11/2011   back of ankle  . Kidney mass 09/11/2011   Per report of pt  . Leukocytopenia   . Liver mass 09/11/2011   Per report of pt  . Mental health disorder   . Myocardial infarction ([place]) 09/11/2011   Per pt in 2005 (small vessel)  . Osteoporosis 09/11/2011  . Pneumonia   . Thigh cramp 09/11/2011   Left thigh x 3 nights since starting unspecified psychotropic medication    Patient Active Problem List   Diagnosis Date Noted  . Anemia due to acute blood loss 04/14/2019    Class: Acute  . Status post total right knee replacement 04/13/2019  . Unilateral primary osteoarthritis, right knee 07/03/2018  . Precordial pain   . Chest pain in adult 12/08/2015  . Weight loss 06/07/2014  . Hyponatremia 10/02/2013  . Chronic cough 10/02/2013  . Anxiety disorder 10/02/2013  . Chronic constipation 10/02/2013  . Leukopenia 05/31/2013  . Microcytic anemia 05/31/2013  . Anemia of chronic illness 06/02/2012  . Bruising 01/13/2012  . Muscle ache 01/13/2012  . Hypothyroidism   . CAD (coronary artery disease)   . Leukocytopenia   . Chest pain 04/01/2011  . CONSTIPATION 08/08/2008  . COUGH 07/03/2007  . Depression 06/26/2007  . HYSTERECTOMY, HX OF 06/26/2007  . Other acquired absence of organ 06/26/2007  . COLON POLYP 09/15/2006  . Hyperlipidemia 09/15/2006  . OBESITY, NOS 09/15/2006  .  Essential hypertension, benign 09/15/2006  . CORONARY, ARTERIOSCLEROSIS 09/15/2006  . RHINITIS, ALLERGIC 09/15/2006  . ASTHMA, UNSPECIFIED 09/15/2006  . GERD (gastroesophageal reflux disease) 09/15/2006  . DIVERTICULITIS OF COLON, NOS 09/15/2006  . OSTEOARTHRITIS, MULTI SITES 09/15/2006  . INCONTINENCE, URGE 09/15/2006    Past Surgical History:  Procedure Laterality Date  . ABDOMINAL HYSTERECTOMY    . APPENDECTOMY    . BREAST BIOPSY     biopsy on R breast (benign)  . BREAST  EXCISIONAL BIOPSY    . CARDIAC CATHETERIZATION N/A 12/10/2015   Procedure: Left Heart Cath and Coronary Angiography;  Surgeon: Burnell Blanks, MD;  Location: [facility];  Service: Cardiovascular;  Laterality: N/A;  . CARDIOVASCULAR STRESS TEST  08-15-2007   EF 59%  . CHOLECYSTECTOMY OPEN    . GALLBLADDER SURGERY  09/11/2011  . HYSTEROTOMY    . KNEE SURGERY  09/11/2011  . SHOULDER SURGERY  09/11/2011   Right side  . TONSILLECTOMY    . TOTAL KNEE ARTHROPLASTY Right 04/13/2019   Procedure: RIGHT TOTAL KNEE ARTHROPLASTY;  Surgeon: Mcarthur Rossetti, MD;  Location: [facility];  Service: Orthopedics;  Laterality: Right;  . US ECHOCARDIOGRAPHY  11/25/2015   Est EF 55-60%     OB History   No obstetric history on file.     Family History  Problem Relation Age of Onset  . Hypertension Sister   . Hypertension Brother     Social History   Tobacco Use  . Smoking status: Never Smoker  . Smokeless tobacco: Never Used  Substance Use Topics  . Alcohol use: No  . Drug use: No    Home Medications Prior to Admission medications   Medication Sig Start Date End Date Taking? Authorizing Provider  acetaminophen (TYLENOL) 500 MG tablet Take 1,000 mg by mouth every 6 (six) hours as needed for moderate pain.     [provider]  amLODipine (NORVASC) 5 MG tablet Take 5 mg by mouth daily.  08/31/17   [provider]  aspirin 81 MG chewable tablet Chew 81 mg by mouth daily.    [provider]  Cholecalciferol 250 MCG (10000 UT) TABS Take 1,000 Units by mouth daily.     [provider]  cloNIDine (CATAPRES) 0.1 MG tablet Take 0.1 mg by mouth daily.     [provider]  docusate sodium (COLACE) 100 MG capsule Take 100-200 mg by mouth daily as needed for moderate constipation.     [provider]  famotidine (PEPCID) 10 MG tablet Take 10 mg by mouth daily.     [provider]  hydrochlorothiazide (HYDRODIURIL) 12.5 MG tablet Take  12.5 mg by mouth daily.  05/28/17   [provider]  HYDROcodone-acetaminophen (NORCO/VICODIN) 5-325 MG tablet Take 1-2 tablets by mouth every 4 (four) hours as needed for moderate pain (pain score 4-6). 04/17/19   Mcarthur Rossetti, MD  levothyroxine (SYNTHROID, LEVOTHROID) 112 MCG tablet Take 112 mcg by mouth daily before breakfast.     [provider]  LINZESS 145 MCG CAPS capsule Take 145 mcg by mouth daily.  11/22/13   [provider]  loratadine (CLARITIN) 10 MG tablet Take 10 mg by mouth daily.    [provider]  Multiple Vitamin (MULTIVITAMIN WITH MINERALS) TABS tablet Take 1 tablet by mouth daily.    [provider]  nitroGLYCERIN (NITROSTAT) 0.4 MG SL tablet Place 1 tablet (0.4 mg total) under the tongue every 5 (five) minutes x 3 doses as needed  for chest pain. 12/11/15   Cheryln Manly, NP  OLANZapine (ZYPREXA) 2.5 MG tablet Take 2.5 mg by mouth at bedtime.    [provider]  olmesartan (BENICAR) 40 MG tablet Take 40 mg by mouth daily.    [provider]  polyethylene glycol (MIRALAX) packet Take 17 g by mouth 2 (two) times daily. 10/04/13   Marton Redwood, MD  potassium chloride SA (K-DUR,KLOR-CON) 20 MEQ tablet Take 20 mEq by mouth daily.    [provider]  Probiotic Product (ALIGN) 4 MG CAPS Take 4 mg by mouth daily.    [provider]  simvastatin (ZOCOR) 10 MG tablet Take 5 mg by mouth daily. 11/08/18   [provider]  traMADol (ULTRAM) 50 MG tablet Take 1-2 tablets (50-100 mg total) by mouth at bedtime as needed. 08/30/19   Mcarthur Rossetti, MD  traZODone (DESYREL) 150 MG tablet Take 225 mg by mouth at bedtime.    [provider]  vitamin C (ASCORBIC ACID) 500 MG tablet Take 500 mg by mouth daily.    [provider]    Allergies    Statins, Cholestatin, Rosuvastatin, Doxycycline, and Prednisone  Review of Systems   Review of Systems Ten systems are reviewed  and are negative for acute change except as noted in the HPI  Physical Exam Updated Vital Signs BP (!) 141/70   Pulse 83   Temp 98.5 F (36.9 C) (Oral)   Resp (!) 25   Wt 66.7 kg   SpO2 93%   BMI 28.72 kg/m   Physical Exam Constitutional:      General: She is not in acute distress.    Appearance: Normal appearance. She is well-developed. She is not ill-appearing or diaphoretic.  HENT:     Head: Normocephalic and atraumatic.     Right Ear: External ear normal.     Left Ear: External ear normal.     Nose: Nose normal.  Eyes:     General: Vision grossly intact. Gaze aligned appropriately.     Pupils: Pupils are equal, round, and reactive to light.  Neck:     Trachea: Trachea and phonation normal. No tracheal deviation.  Cardiovascular:     Rate and Rhythm: Normal rate and regular rhythm.     Pulses:          Radial pulses are 2+ on the right side and 2+ on the left side.       Dorsalis pedis pulses are 2+ on the right side and 2+ on the left side.  Pulmonary:     Effort: Pulmonary effort is normal. No respiratory distress.     Breath sounds: Normal breath sounds.  Chest:     Chest wall: No tenderness.  Abdominal:     General: There is no distension.     Palpations: Abdomen is soft.     Tenderness: There is no abdominal tenderness. There is no guarding or rebound.  Musculoskeletal:        General: Normal range of motion.     Cervical back: Normal range of motion.     Right lower leg: No tenderness. No edema.     Left lower leg: No tenderness. No edema.  Skin:    General: Skin is warm and dry.  Neurological:     Mental Status: She is alert.     GCS: GCS eye subscore is 4. GCS verbal subscore is 5. GCS motor subscore is 6.     Comments: Speech is  clear and goal oriented, follows commands Major Cranial nerves without deficit, no facial droop Moves extremities without ataxia, coordination intact  Psychiatric:        Behavior: Behavior normal.     ED Results /  Procedures / Treatments   Labs (all labs ordered are listed, but only abnormal results are displayed) Labs Reviewed  BASIC METABOLIC PANEL - Abnormal; Notable for the following components:      Result Value   Sodium 130 (*)    Chloride 92 (*)    Glucose, Bld 101 (*)    All other components within normal limits  CBC - Abnormal; Notable for the following components:   WBC 2.7 (*)    Hemoglobin 10.4 (*)    HCT 33.1 (*)    MCV 76.3 (*)    MCH 24.0 (*)    All other components within normal limits  SARS CORONAVIRUS 2 (TAT 6-24 HRS)  MAGNESIUM  TROPONIN I (HIGH SENSITIVITY)  TROPONIN I (HIGH SENSITIVITY)    EKG EKG Interpretation  Date/Time:  Friday September 21 2019 13:18:32 EST Ventricular Rate:  66 PR Interval:    QRS Duration: 83 QT Interval:  404 QTC Calculation: 424 R Axis:   68 Text Interpretation: Sinus rhythm Confirmed by Davonna Belling [phone]) on 09/21/2019 2:21:44 PM   Radiology DG Chest Port 1 View  Result Date: 09/21/2019 CLINICAL DATA:  Chest pain. EXAM: PORTABLE CHEST 1 VIEW COMPARISON:  PA and lateral chest 12/08/2015. FINDINGS: Lungs clear. Mild cardiomegaly. Atherosclerosis. No pneumothorax or pleural effusion. IMPRESSION: No acute disease. Mild cardiomegaly. Atherosclerosis. Electronically Signed   By: Inge Rise M.D.   On: 09/21/2019 13:43    Procedures Procedures (including critical care time)  Medications Ordered in ED Medications  sodium chloride 0.9 % bolus 500 mL (has no administration in time range)    ED Course  I have reviewed the triage vital signs and the nursing notes.  Pertinent labs & imaging results that were available during my care of the patient were reviewed by me and considered in my medical decision making (see chart for details).  Clinical Course as of Sep 20 1532  Fri Sep 21, 2019  1521 Dr. Doristine Bosworth   [BM]    Clinical Course User Index [BM] Gari Crown   MDM Rules/Calculators/A&P                       86 year old female presents today for substernal chest pain that began at 1 PM while sitting on her couch.  She did not eat for pain began.  She took antacids without relief, then took 1 nitro and finally 324 mg aspirin for pain finally resolved.  She is brought in by EMS today.  On arrival she is well-appearing and in no acute distress, cranial nerves intact, no meningeal signs, heart regular rate and rhythm, lungs clear, abdomen is soft nontender without peritoneal signs, neurovascular intact to all 4 extremities without evidence of DVT.  Chest pain work-up initiated with CBC, BMP, troponin, chest x-ray and EKG.  No tachycardia or or hypoxia on room air, no pleurisy is currently chest pain-free doubt PE or dissection at this time. - CBC shows mild leukopenia of 2.7, hemoglobin 10.4 which appears baseline.  She has no infectious-like symptoms infection currently.  Will obtain Covid test for her leukopenia.  BMP shows sodium 130, chloride 92, no emergent electrolyte derangement or evidence of kidney injury.  Normal saline bolus has been ordered for electrolyte repletion.  High-sensitivity troponin of 3, she will need a delta troponin due to time of onset.  Chest x-ray:  IMPRESSION:  No acute disease.    Mild cardiomegaly.    Atherosclerosis.  I personally reviewed patient's chest x-ray and agree with radiologist interpretation.  EKG: Sinus rhythm Confirmed by Davonna Belling [phone]) on 09/21/2019 2:21:44 PM ------------------------- Chart reviewed, patient's most recent significant cardiac work-up was in May 2017 where she had an left heart cath and echocardiogram.  Echo: Impressions:  - Normal LV function; grade 1 diastolic dysfunction; mild LAE; mild  MR and TR.    Left Heart Cath: Mid LAD to Dist LAD lesion, 20% stenosed.  1st Diag-1 lesion, 30% stenosed.  1st Diag-2 lesion, 50% stenosed.   1. Mild non-obstructive CAD ---------------------------------- Patient symptoms  sound typical today, concerned patient may need further cardiac work-up.  Will consult hospitalist for admission.  I spoke to Dr. Doristine Bosworth from hospitalist service who is accepted patient for further evaluation. - Patient was reassessed resting comfortably no acute distress reports she is still chest pain-free and is feeling much better.  She has no additional concerns or complaints at this time.  She is agreeable for admission.   Patient's case discussed with Dr. Alvino Chapel who agrees with plan.  Note: Portions of this report may have been transcribed using voice recognition software. Every effort was made to ensure accuracy; however, inadvertent computerized transcription errors may still be present. Final Clinical Impression(s) / ED Diagnoses Final diagnoses:  Chest pain, unspecified type    Rx / DC Orders ED Discharge Orders    None       Gari Crown 09/21/19 1535    Davonna Belling, MD 09/22/19 1419

## 2019-09-22 DIAGNOSIS — R079 Chest pain, unspecified: Secondary | ICD-10-CM

## 2019-09-22 NOTE — Discharge Summary (Signed)
Physician Discharge Summary  [name] [id number] DOB: 1932-06-09 DOA: 09/21/2019  PCP: Marton Redwood, MD  Admit date: 09/21/2019 Discharge date: 09/22/2019  Admitted From: Home Disposition: Home  Recommendations for Outpatient Follow-up:  1. Follow up with PCP in 1-2 weeks 2. Please obtain BMP/CBC in one week 3. Please follow up on the following pending results:  Home Health: None Equipment/Devices: None  Discharge Condition: Stable CODE STATUS: Full code Diet recommendation: Cardiac  Subjective: Seen and examined.  No more chest pain but she is complaining of bilateral shoulder pain and back pain which gets worse with lifting her both arms.  HPI: [name] is a 86 y.o. female with medical history significant of hypertension, hyperlipidemia, coronary artery disease status post LHC in 2017, hypothyroidism, bilateral osteoarthritis status post bilateral total knee replacement, depression, GERD, muscle cramps presents to emergency department with chest pain started 1 hour prior to arrival.  Patient tells me that she had lunch this afternoon and suddenly she developed chest pain.  Initially she thought that her chest pain related to GERD, she took 2 chewable Tums with no help and then she took nitro x2 with no help and then she called 911 and came to the emergency department for further evaluation and management.  Patient tells me that her chest pain is central, nonradiating, pressure-like, 10 out of 10, not associated with nausea, vomiting, diaphoresis, shortness of breath, palpitation.  She tells me that her symptoms are not as same as she had with MI about 15 years ago.  She lives alone and uses walker for ambulation.  No history of smoking, alcohol, is a drug use.  ED Course: Upon arrival: Patient vital signs stable, maintaining oxygen saturation on room air, initial troponin negative, chest x-ray negative for acute findings.  EKG: No acute findings.   [facility] consulted for overnight observation for chest pain rule out ACS  Brief/Interim Summary: Patient was admitted due to chest pain.  Cardiac enzymes x3 -.  EKG unremarkable.  No more chest pain.  No event on telemetry.  With the fact that she has back pain and shoulder pain which gets worse with lifting both arms indicates her chest pain was likely musculoskeletal.  She is hemodynamically stable and she is going to be discharged home and will resume her home medications.  Discharge Diagnoses:  Principal Problem:   Chest pain in adult Active Problems:   Hyperlipidemia   Depression   Essential hypertension, benign   GERD (gastroesophageal reflux disease)   Hypothyroidism   CAD (coronary artery disease)   Leukopenia   Microcytic anemia   Hyponatremia   Chest pain    Discharge Instructions  Discharge Instructions    Discharge patient   Complete by: As directed    Discharge disposition: 01-Home or Self Care   Discharge patient date: 09/22/2019     Allergies as of 09/22/2019      Reactions   Statins Other (See Comments)   Muscle aches   Cholestatin    Rosuvastatin Other (See Comments)   Doxycycline Itching, Rash   Prednisone Itching      Medication List    TAKE these medications   acetaminophen 500 MG tablet Commonly known as: TYLENOL Take 1,000 mg by mouth every 6 (six) hours as needed for moderate pain.   Align 4 MG Caps Take 4 mg by mouth daily.   amLODipine 5 MG tablet Commonly known as: NORVASC Take 5 mg by mouth daily.   aspirin 81 MG chewable  tablet Chew 81 mg by mouth daily.   Cholecalciferol 250 MCG (10000 UT) Tabs Take 1,000 Units by mouth daily.   cloNIDine 0.1 MG tablet Commonly known as: CATAPRES Take 0.1 mg by mouth daily.   docusate sodium 100 MG capsule Commonly known as: COLACE Take 100-200 mg by mouth daily as needed for moderate constipation.   famotidine 20 MG tablet Commonly known as: PEPCID Take 10 mg by mouth daily.    hydrochlorothiazide 12.5 MG tablet Commonly known as: HYDRODIURIL Take 12.5 mg by mouth at bedtime.   HYDROcodone-acetaminophen 5-325 MG tablet Commonly known as: NORCO/VICODIN Take 1-2 tablets by mouth every 4 (four) hours as needed for moderate pain (pain score 4-6).   levothyroxine 112 MCG tablet Commonly known as: SYNTHROID Take 112 mcg by mouth daily before breakfast.   Linzess 145 MCG Caps capsule Generic drug: linaclotide Take 145 mcg by mouth daily.   multivitamin with minerals Tabs tablet Take 1 tablet by mouth daily.   nitroGLYCERIN 0.4 MG SL tablet Commonly known as: NITROSTAT Place 1 tablet (0.4 mg total) under the tongue every 5 (five) minutes x 3 doses as needed for chest pain.   OLANZapine 2.5 MG tablet Commonly known as: ZYPREXA Take 2.5 mg by mouth at bedtime.   olmesartan 40 MG tablet Commonly known as: BENICAR Take 40 mg by mouth daily.   polyethylene glycol 17 g packet Commonly known as: MiraLax Take 17 g by mouth 2 (two) times daily.   potassium chloride SA 20 MEQ tablet Commonly known as: KLOR-CON Take 20 mEq by mouth daily.   rosuvastatin 10 MG tablet Commonly known as: CRESTOR Take 10 mg by mouth daily.   simvastatin 10 MG tablet Commonly known as: ZOCOR Take 5 mg by mouth daily.   traMADol 50 MG tablet Commonly known as: ULTRAM Take 1-2 tablets (50-100 mg total) by mouth at bedtime as needed.   traZODone 150 MG tablet Commonly known as: DESYREL Take 225 mg by mouth at bedtime.   vitamin C 500 MG tablet Commonly known as: ASCORBIC ACID Take 500 mg by mouth daily.      Follow-up Information    Marton Redwood, MD Follow up in 1 week(s).   Specialty: Internal Medicine Contact information: [place] [address] [phone]        Nahser, Wonda Cheng, MD .   Specialty: Cardiology Contact information: [address] [place] [address] [phone]          Allergies  Allergen  Reactions  . Statins Other (See Comments)    Muscle aches  . Cholestatin   . Rosuvastatin Other (See Comments)  . Doxycycline Itching and Rash  . Prednisone Itching    Consultations: None   Procedures/Studies: DG Chest Port 1 View  Result Date: 09/21/2019 CLINICAL DATA:  Chest pain. EXAM: PORTABLE CHEST 1 VIEW COMPARISON:  PA and lateral chest 12/08/2015. FINDINGS: Lungs clear. Mild cardiomegaly. Atherosclerosis. No pneumothorax or pleural effusion. IMPRESSION: No acute disease. Mild cardiomegaly. Atherosclerosis. Electronically Signed   By: Inge Rise M.D.   On: 09/21/2019 13:43   MM 3D SCREEN BREAST BILATERAL  Result Date: 09/07/2019 CLINICAL DATA:  Screening. EXAM: DIGITAL SCREENING BILATERAL MAMMOGRAM WITH TOMO AND CAD COMPARISON:  Previous exam(s). ACR Breast Density Category b: There are scattered areas of fibroglandular density. FINDINGS: There are no findings suspicious for malignancy. Images were processed with CAD. IMPRESSION: No mammographic evidence of malignancy. A result letter of this screening mammogram will be mailed directly  to the patient. RECOMMENDATION: Screening mammogram in one year. (Code:[id number]) BI-RADS CATEGORY  1: Negative. Electronically Signed   By: Evangeline Dakin M.D.   On: 09/07/2019 16:41   XR Knee 1-2 Views Right  Result Date: 08/30/2019 2 views of the right knee show a total knee arthroplasty with no complicating features.     Discharge Exam: Vitals:   09/22/19 0516 09/22/19 0751  BP: 120/61 129/64  Pulse: 75 70  Resp: 19 16  Temp: (!) 97.5 F (36.4 C) 97.9 F (36.6 C)  SpO2: 95% 94%   Vitals:   09/21/19 1701 09/21/19 2119 09/22/19 0516 09/22/19 0751  BP:  (!) 118/56 120/61 129/64  Pulse:   75 70  Resp:  18 19 16   Temp:  98.1 F (36.7 C) (!) 97.5 F (36.4 C) 97.9 F (36.6 C)  TempSrc:  Oral Oral Oral  SpO2:  98% 95% 94%  Weight: 64.3 kg     Height: 5' (1.524 m)       General: Pt is alert, awake, not in acute  distress Cardiovascular: RRR, S1/S2 +, no rubs, no gallops Respiratory: CTA bilaterally, no wheezing, no rhonchi Abdominal: Soft, NT, ND, bowel sounds + Extremities: no edema, no cyanosis    The results of significant diagnostics from this hospitalization (including imaging, microbiology, ancillary and laboratory) are listed below for reference.     Microbiology: Recent Results (from the past 240 hour(s))  SARS CORONAVIRUS 2 (TAT 6-24 HRS) Nasopharyngeal Nasopharyngeal Swab     Status: None   Collection Time: 09/21/19  3:45 PM   Specimen: Nasopharyngeal Swab  Result Value Ref Range Status   SARS Coronavirus 2 NEGATIVE NEGATIVE Final    Comment: (NOTE) SARS-CoV-2 target nucleic acids are NOT DETECTED. The SARS-CoV-2 RNA is generally detectable in upper and lower respiratory specimens during the acute phase of infection. Negative results do not preclude SARS-CoV-2 infection, do not rule out co-infections with other pathogens, and should not be used as the sole basis for treatment or other patient management decisions. Negative results must be combined with clinical observations, patient history, and epidemiological information. The expected result is Negative. Fact Sheet for Patients: SugarRoll.be Fact Sheet for Healthcare Providers: https://www.woods-mathews.com/ This test is not yet approved or cleared by the [place] FDA and  has been authorized for detection and/or diagnosis of SARS-CoV-2 by FDA under an Emergency Use Authorization (EUA). This EUA will remain  in effect (meaning this test can be used) for the duration of the COVID-19 declaration under Section 56 4(b)(1) of the Act, 21 U.S.C. section 360bbb-3(b)(1), unless the authorization is terminated or revoked sooner. Performed at [facility], [place] [address] [place], [place] [address]      Labs: BNP (last 3 results) No results for input(s): BNP in the last 8760  hours. Basic Metabolic Panel: Recent Labs  Lab 09/21/19 1349 09/21/19 1545  NA 130*  --   K 4.5  --   CL 92*  --   CO2 30  --   GLUCOSE 101*  --   BUN 8  --   CREATININE 0.79 0.75  CALCIUM 9.3  --   MG  --  2.0   Liver Function Tests: No results for input(s): AST, ALT, ALKPHOS, BILITOT, PROT, ALBUMIN in the last 168 hours. No results for input(s): LIPASE, AMYLASE in the last 168 hours. No results for input(s): AMMONIA in the last 168 hours. CBC: Recent Labs  Lab 09/21/19 1349 09/21/19 1545  WBC 2.7* 2.8*  HGB 10.4* 10.5*  HCT 33.1* 32.9*  MCV 76.3* 74.8*  PLT 193 204   Cardiac Enzymes: No results for input(s): CKTOTAL, CKMB, CKMBINDEX, TROPONINI in the last 168 hours. BNP: Invalid input(s): POCBNP CBG: No results for input(s): GLUCAP in the last 168 hours. D-Dimer No results for input(s): DDIMER in the last 72 hours. Hgb A1c No results for input(s): HGBA1C in the last 72 hours. Lipid Profile Recent Labs    09/21/19 1545  CHOL 159  HDL 70  LDLCALC 83  TRIG 29  CHOLHDL 2.3   Thyroid function studies No results for input(s): TSH, T4TOTAL, T3FREE, THYROIDAB in the last 72 hours.  Invalid input(s): FREET3 Anemia work up No results for input(s): VITAMINB12, FOLATE, FERRITIN, TIBC, IRON, RETICCTPCT in the last 72 hours. Urinalysis    Component Value Date/Time   COLORURINE YELLOW 10/02/2013 2006   APPEARANCEUR CLEAR 10/02/2013 2006   LABSPEC 1.006 10/02/2013 2006   PHURINE 5.5 10/02/2013 2006   GLUCOSEU NEGATIVE 10/02/2013 2006   HGBUR TRACE (A) 10/02/2013 2006   BILIRUBINUR NEGATIVE 10/02/2013 2006   KETONESUR 15 (A) 10/02/2013 2006   PROTEINUR NEGATIVE 10/02/2013 2006   UROBILINOGEN 0.2 10/02/2013 2006   NITRITE NEGATIVE 10/02/2013 2006   LEUKOCYTESUR NEGATIVE 10/02/2013 2006   Sepsis Labs Invalid input(s): PROCALCITONIN,  WBC,  LACTICIDVEN Microbiology Recent Results (from the past 240 hour(s))  SARS CORONAVIRUS 2 (TAT 6-24 HRS) Nasopharyngeal  Nasopharyngeal Swab     Status: None   Collection Time: 09/21/19  3:45 PM   Specimen: Nasopharyngeal Swab  Result Value Ref Range Status   SARS Coronavirus 2 NEGATIVE NEGATIVE Final    Comment: (NOTE) SARS-CoV-2 target nucleic acids are NOT DETECTED. The SARS-CoV-2 RNA is generally detectable in upper and lower respiratory specimens during the acute phase of infection. Negative results do not preclude SARS-CoV-2 infection, do not rule out co-infections with other pathogens, and should not be used as the sole basis for treatment or other patient management decisions. Negative results must be combined with clinical observations, patient history, and epidemiological information. The expected result is Negative. Fact Sheet for Patients: SugarRoll.be Fact Sheet for Healthcare Providers: https://www.woods-mathews.com/ This test is not yet approved or cleared by the [place] FDA and  has been authorized for detection and/or diagnosis of SARS-CoV-2 by FDA under an Emergency Use Authorization (EUA). This EUA will remain  in effect (meaning this test can be used) for the duration of the COVID-19 declaration under Section 56 4(b)(1) of the Act, 21 U.S.C. section 360bbb-3(b)(1), unless the authorization is terminated or revoked sooner. Performed at [facility], [place] [address] [place], [place] [address]      Time coordinating discharge: Over 30 minutes  SIGNED:   Darliss Cheney, MD  [facility] 09/22/2019, 10:28 AM  If 7PM-7AM, please contact night-coverage [email or web address]

## 2019-09-22 NOTE — Progress Notes (Signed)
Pt admitted today from ED.  Pt placed on telemetry and CCMD notified.  CHG bath completed.  Vitals taken.  Pt currently stable and A&O X 4.  Pt will continue to be monitored.

## 2019-09-22 NOTE — Discharge Instructions (Signed)

## 2019-09-22 NOTE — Progress Notes (Signed)
Discharge instructions provided to patient. All medications, follow up appointments, and discharge instructions reviewed. IV out. Monitor off CCMD notified. Patients son at bedside. Discharging to home. Paulene Floor, RN

## 2019-09-25 NOTE — Progress Notes (Signed)
Cardiology Office Note    Date:  09/26/2019   ID:  Killian, Demedeiros 1931/10/15, MRN [id number]  PCP:  Marton Redwood, MD  Cardiologist: Dr. Acie Fredrickson  Chief Complaint: Hospital follow up  History of Present Illness:   [name] is a 86 y.o. female with hx of mild CAD by cath in 2017, HLD, GERD s/p nissen fundoplication with chronic cough presents for follow up.   Cath 11/2015: 20% LAD; 30% 1st dig; 50% 2nd dig>> medical therapy.   Last seen by Dr. Acie Fredrickson 06/18/19.   Admitted 09/2019 with chest pain. Admitted and ruled out. Felt MSK in etiology.   Here today for follow up.  Patient continues to have neck/upper back and left arm pain which is worse with laying down and lifting arm.  She denies chest tightness/heaviness or shortness of breath.  Has chronic mild lower extremity edema and takes hydrochlorothiazide.  Patient reports prior history of significant dehydration on Lasix.  Past Medical History:  Diagnosis Date  . Anemia 06/02/2012  . Anginal pain ([place])    complained of chest pain last week lasting momentarily week of 04/02/2019 took reflux medication, then took a nitro and laid down and got relief  . Anxiety   . Arthritis   . CAD (coronary artery disease)    a. 2005 small inferoapical MI --> medical therapy b. 2007 - nomral coronaries c. 11/2015 mid to distal LAD 20% stenosis, 1st diag 30%, and 50%,--> medical therapy    . Cataracts, bilateral 09/11/2011   Per report of pt.  . Esophageal abnormality 09/11/2011   Surgically treated per pt.  Marland Kitchen GERD (gastroesophageal reflux disease)   . Headache(784.0)   . Hypercholesterolemia 09/11/2011  . Hyperlipemia   . Hypertension   . Hypothyroid 09/11/2011  . Hypothyroidism   . Insect bite of ankle, left 09/11/2011   back of ankle  . Kidney mass 09/11/2011   Per report of pt  . Leukocytopenia   . Liver mass 09/11/2011   Per report of pt  . Mental health disorder   . Myocardial infarction ([place]) 09/11/2011   Per  pt in 2005 (small vessel)  . Osteoporosis 09/11/2011  . Pneumonia   . Thigh cramp 09/11/2011   Left thigh x 3 nights since starting unspecified psychotropic medication    Past Surgical History:  Procedure Laterality Date  . ABDOMINAL HYSTERECTOMY    . APPENDECTOMY    . BREAST BIOPSY     biopsy on R breast (benign)  . BREAST EXCISIONAL BIOPSY    . CARDIAC CATHETERIZATION N/A 12/10/2015   Procedure: Left Heart Cath and Coronary Angiography;  Surgeon: Burnell Blanks, MD;  Location: [facility];  Service: Cardiovascular;  Laterality: N/A;  . CARDIOVASCULAR STRESS TEST  08-15-2007   EF 59%  . CHOLECYSTECTOMY OPEN    . GALLBLADDER SURGERY  09/11/2011  . HYSTEROTOMY    . KNEE SURGERY  09/11/2011  . SHOULDER SURGERY  09/11/2011   Right side  . TONSILLECTOMY    . TOTAL KNEE ARTHROPLASTY Right 04/13/2019   Procedure: RIGHT TOTAL KNEE ARTHROPLASTY;  Surgeon: Mcarthur Rossetti, MD;  Location: [facility];  Service: Orthopedics;  Laterality: Right;  . US ECHOCARDIOGRAPHY  11/25/2015   Est EF 55-60%    Current Medications: Prior to Admission medications   Medication Sig Start Date End Date Taking? Authorizing Provider  acetaminophen (TYLENOL) 500 MG tablet Take 1,000 mg by mouth every 6 (six) hours as needed for moderate pain.  [provider]  amLODipine (NORVASC) 5 MG tablet Take 5 mg by mouth daily.  08/31/17   [provider]  aspirin 81 MG chewable tablet Chew 81 mg by mouth daily.    [provider]  Cholecalciferol 250 MCG (10000 UT) TABS Take 1,000 Units by mouth daily.     [provider]  cloNIDine (CATAPRES) 0.1 MG tablet Take 0.1 mg by mouth daily.     [provider]  docusate sodium (COLACE) 100 MG capsule Take 100-200 mg by mouth daily as needed for moderate constipation.     [provider]  famotidine (PEPCID) 20 MG tablet Take 10 mg by mouth daily.     [provider]  hydrochlorothiazide (HYDRODIURIL)  12.5 MG tablet Take 12.5 mg by mouth at bedtime.  05/28/17   [provider]  HYDROcodone-acetaminophen (NORCO/VICODIN) 5-325 MG tablet Take 1-2 tablets by mouth every 4 (four) hours as needed for moderate pain (pain score 4-6). Patient not taking: Reported on 09/21/2019 04/17/19   Mcarthur Rossetti, MD  levothyroxine (SYNTHROID, LEVOTHROID) 112 MCG tablet Take 112 mcg by mouth daily before breakfast.     [provider]  LINZESS 145 MCG CAPS capsule Take 145 mcg by mouth daily.  11/22/13   [provider]  Multiple Vitamin (MULTIVITAMIN WITH MINERALS) TABS tablet Take 1 tablet by mouth daily.    [provider]  nitroGLYCERIN (NITROSTAT) 0.4 MG SL tablet Place 1 tablet (0.4 mg total) under the tongue every 5 (five) minutes x 3 doses as needed for chest pain. 12/11/15   Cheryln Manly, NP  OLANZapine (ZYPREXA) 2.5 MG tablet Take 2.5 mg by mouth at bedtime.    [provider]  olmesartan (BENICAR) 40 MG tablet Take 40 mg by mouth daily.    [provider]  polyethylene glycol (MIRALAX) packet Take 17 g by mouth 2 (two) times daily. 10/04/13   Marton Redwood, MD  potassium chloride SA (K-DUR,KLOR-CON) 20 MEQ tablet Take 20 mEq by mouth daily.    [provider]  Probiotic Product (ALIGN) 4 MG CAPS Take 4 mg by mouth daily.    [provider]  rosuvastatin (CRESTOR) 10 MG tablet Take 10 mg by mouth daily. 09/12/19   [provider]  simvastatin (ZOCOR) 10 MG tablet Take 5 mg by mouth daily. 11/08/18   [provider]  traMADol (ULTRAM) 50 MG tablet Take 1-2 tablets (50-100 mg total) by mouth at bedtime as needed. 08/30/19   Mcarthur Rossetti, MD  traZODone (DESYREL) 150 MG tablet Take 225 mg by mouth at bedtime.    [provider]  vitamin C (ASCORBIC ACID) 500 MG tablet Take 500 mg by mouth daily.    [provider]    Allergies:   Statins, Cholestatin, Rosuvastatin, Doxycycline, and  Prednisone   Social History   Socioeconomic History  . Marital status: Single    Spouse name: Not on file  . Number of children: Not on file  . Years of education: Not on file  . Highest education level: Not on file  Occupational History  . Not on file  Tobacco Use  . Smoking status: Never Smoker  . Smokeless tobacco: Never Used  Substance and Sexual Activity  . Alcohol use: No  . Drug use: No  . Sexual activity: Not Currently  Other Topics Concern  . Not on file  Social History Narrative  . Not on file   Social Determinants of Health  Financial Resource Strain:   . Difficulty of Paying Living Expenses: Not on file  Food Insecurity:   . Worried About Charity fundraiser in the Last Year: Not on file  . Ran Out of Food in the Last Year: Not on file  Transportation Needs:   . Lack of Transportation (Medical): Not on file  . Lack of Transportation (Non-Medical): Not on file  Physical Activity:   . Days of Exercise per Week: Not on file  . Minutes of Exercise per Session: Not on file  Stress:   . Feeling of Stress : Not on file  Social Connections:   . Frequency of Communication with Friends and Family: Not on file  . Frequency of Social Gatherings with Friends and Family: Not on file  . Attends Religious Services: Not on file  . Active Member of Clubs or Organizations: Not on file  . Attends Archivist Meetings: Not on file  . Marital Status: Not on file     Family History:  The patient's family history includes Hypertension in her brother and sister.  ROS:   Please see the history of present illness.    ROS All other systems reviewed and are negative.   PHYSICAL EXAM:   VS:  BP 120/60   Pulse 82   Ht 5' (1.524 m)   Wt 145 lb 6.4 oz (66 kg)   SpO2 99%   BMI 28.40 kg/m    GEN: Well nourished, well developed, in no acute distress  HEENT: normal  Neck: no JVD, carotid bruits, or masses Cardiac: RRR; no murmurs, rubs, or gallops,no edema    Respiratory:  clear to auscultation bilaterally, normal work of breathing GI: soft, nontender, nondistended, + BS MS: no deformity or atrophy , arm and back pain with palpation Skin: warm and dry, no rash Neuro:  Alert and Oriented x 3, Strength and sensation are intact Psych: euthymic mood, full affect  Wt Readings from Last 3 Encounters:  09/26/19 145 lb 6.4 oz (66 kg)  09/21/19 141 lb 12.1 oz (64.3 kg)  06/18/19 147 lb (66.7 kg)      Studies/Labs Reviewed:   EKG:  EKG is not ordered today.    Recent Labs: 09/21/2019: BUN 8; Creatinine, Ser 0.75; Hemoglobin 10.5; Magnesium 2.0; Platelets 204; Potassium 4.5; Sodium 130   Lipid Panel    Component Value Date/Time   CHOL 159 09/21/2019 1545   TRIG 29 09/21/2019 1545   HDL 70 09/21/2019 1545   CHOLHDL 2.3 09/21/2019 1545   VLDL 6 09/21/2019 1545   LDLCALC 83 09/21/2019 1545   LDLDIRECT 109.5 03/29/2011 1007    Additional studies/ records that were reviewed today include:   Echocardiogram: 11/25/2015 Study Conclusions   - Left ventricle: The cavity size was normal. There was mild focal  basal hypertrophy of the septum. Systolic function was normal.  The estimated ejection fraction was in the range of 55% to 60%.  Wall motion was normal; there were no regional wall motion  abnormalities. Doppler parameters are consistent with abnormal  left ventricular relaxation (grade 1 diastolic dysfunction).  - Mitral valve: Calcified annulus. There was mild regurgitation.  - Left atrium: The atrium was mildly dilated.   Impressions:   - Normal LV function; grade 1 diastolic dysfunction; mild LAE; mild  MR and TR.  Cardiac Catheterization: 11/2015  Mid LAD to Dist LAD lesion, 20% stenosed.  1st Diag-1 lesion, 30% stenosed.  1st Diag-2 lesion, 50% stenosed.   1. Mild non-obstructive  CAD  Recommendations: Continue medical management of CAD.  Diagnostic Dominance: Right        ASSESSMENT & PLAN:    1.   Non  obstructive CAD -No anginal symptoms.  Continue aspirin.  2.  Upper back/neck/arm pain -Her symptoms and exam consistent with musculoskeletal in etiology. -She takes Tylenol and tramadol for pain as needed. -Has follow-up with PCP in few days  3. HLD - followed by PCP  4. HTN - BP stable on current medications  5. LE edema - Recommended DASH diet with leg elevation and compression stocking. - continue low dose HCTZ. Has follow up with PCP  Medication Adjustments/Labs and Tests Ordered: Current medicines are reviewed at length with the patient today.  Concerns regarding medicines are outlined above.  Medication changes, Labs and Tests ordered today are listed in the Patient Instructions below. Patient Instructions  Medication Instructions:  Your physician recommends that you continue on your current medications as directed. Please refer to the Current Medication list given to you today.  *If you need a refill on your cardiac medications before your next appointment, please call your pharmacy*   Lab Work: None ordered  If you have labs (blood work) drawn today and your tests are completely normal, you will receive your results only by: Marland Kitchen MyChart Message (if you have MyChart) OR . A paper copy in the mail If you have any lab test that is abnormal or we need to change your treatment, we will call you to review the results.   Testing/Procedures: None ordered   Follow-Up: At [facility], you and your health needs are our priority.  As part of our continuing mission to provide you with exceptional heart care, we have created designated Provider Care Teams.  These Care Teams include your primary Cardiologist (physician) and Advanced Practice Providers (APPs -  Physician Assistants and Nurse Practitioners) who all work together to provide you with the care you need, when you need it.  We recommend signing up for the patient portal called "MyChart".  Sign up information is  provided on this After Visit Summary.  MyChart is used to connect with patients for Virtual Visits (Telemedicine).  Patients are able to view lab/test results, encounter notes, upcoming appointments, etc.  Non-urgent messages can be sent to your provider as well.   To learn more about what you can do with MyChart, go to [email or web address].    Your next appointment:   6 month(s)  The format for your next appointment:   In Person  Provider:   You may see Mertie Moores, MD or one of the following Advanced Practice Providers on your designated Care Team:    Richardson Dopp, PA-C  [place], [place]  Daune Perch, NP    Other Instructions  Keep your legs elevated  Wear compression stockings  DASH Eating Plan DASH stands for "Dietary Approaches to Stop Hypertension." The DASH eating plan is a healthy eating plan that has been shown to reduce high blood pressure (hypertension). It may also reduce your risk for type 2 diabetes, heart disease, and stroke. The DASH eating plan may also help with weight loss. What are tips for following this plan?  General guidelines  Avoid eating more than 2,300 mg (milligrams) of salt (sodium) a day. If you have hypertension, you may need to reduce your sodium intake to 1,500 mg a day.  Limit alcohol intake to no more than 1 drink a day for nonpregnant women and 2 drinks a day for  men. One drink equals 12 oz of beer, 5 oz of wine, or 1 oz of hard liquor.  Work with your health care provider to maintain a healthy body weight or to lose weight. Ask what an ideal weight is for you.  Get at least 30 minutes of exercise that causes your heart to beat faster (aerobic exercise) most days of the week. Activities may include walking, swimming, or biking.  Work with your health care provider or diet and nutrition specialist (dietitian) to adjust your eating plan to your individual calorie needs. Reading food labels   Check food labels for the amount of  sodium per serving. Choose foods with less than 5 percent of the Daily Value of sodium. Generally, foods with less than 300 mg of sodium per serving fit into this eating plan.  To find whole grains, look for the word "whole" as the first word in the ingredient list. Shopping  Buy products labeled as "low-sodium" or "no salt added."  Buy fresh foods. Avoid canned foods and premade or frozen meals. Cooking  Avoid adding salt when cooking. Use salt-free seasonings or herbs instead of table salt or sea salt. Check with your health care provider or pharmacist before using salt substitutes.  Do not fry foods. Cook foods using healthy methods such as baking, boiling, grilling, and broiling instead.  Cook with heart-healthy oils, such as olive, canola, soybean, or sunflower oil. Meal planning  Eat a balanced diet that includes: ? 5 or more servings of fruits and vegetables each day. At each meal, try to fill half of your plate with fruits and vegetables. ? Up to 6-8 servings of whole grains each day. ? Less than 6 oz of lean meat, poultry, or fish each day. A 3-oz serving of meat is about the same size as a deck of cards. One egg equals 1 oz. ? 2 servings of low-fat dairy each day. ? A serving of nuts, seeds, or beans 5 times each week. ? Heart-healthy fats. Healthy fats called Omega-3 fatty acids are found in foods such as flaxseeds and coldwater fish, like sardines, salmon, and mackerel.  Limit how much you eat of the following: ? Canned or prepackaged foods. ? Food that is high in trans fat, such as fried foods. ? Food that is high in saturated fat, such as fatty meat. ? Sweets, desserts, sugary drinks, and other foods with added sugar. ? Full-fat dairy products.  Do not salt foods before eating.  Try to eat at least 2 vegetarian meals each week.  Eat more home-cooked food and less restaurant, buffet, and fast food.  When eating at a restaurant, ask that your food be prepared with  less salt or no salt, if possible. What foods are recommended? The items listed may not be a complete list. Talk with your dietitian about what dietary choices are best for you. Grains Whole-grain or whole-wheat bread. Whole-grain or whole-wheat pasta. Brown rice. [place]. Bulgur. Whole-grain and low-sodium cereals. Pita bread. Low-fat, low-sodium crackers. Whole-wheat flour tortillas. Vegetables Fresh or frozen vegetables (raw, steamed, roasted, or grilled). Low-sodium or reduced-sodium tomato and vegetable juice. Low-sodium or reduced-sodium tomato sauce and tomato paste. Low-sodium or reduced-sodium canned vegetables. Fruits All fresh, dried, or frozen fruit. Canned fruit in natural juice (without added sugar). Meat and other protein foods Skinless chicken or [place]. Ground chicken or [place]. Pork with fat trimmed off. Fish and seafood. Egg whites. Dried beans, peas, or lentils. Unsalted nuts, nut butters, and seeds. Unsalted canned  beans. Lean cuts of beef with fat trimmed off. Low-sodium, lean deli meat. Dairy Low-fat (1%) or fat-free (skim) milk. Fat-free, low-fat, or reduced-fat cheeses. Nonfat, low-sodium ricotta or cottage cheese. Low-fat or nonfat yogurt. Low-fat, low-sodium cheese. Fats and oils Soft margarine without trans fats. Vegetable oil. Low-fat, reduced-fat, or light mayonnaise and salad dressings (reduced-sodium). Canola, safflower, olive, soybean, and sunflower oils. Avocado. Seasoning and other foods Herbs. Spices. Seasoning mixes without salt. Unsalted popcorn and pretzels. Fat-free sweets. What foods are not recommended? The items listed may not be a complete list. Talk with your dietitian about what dietary choices are best for you. Grains Baked goods made with fat, such as croissants, muffins, or some breads. Dry pasta or rice meal packs. Vegetables Creamed or fried vegetables. Vegetables in a cheese sauce. Regular canned vegetables (not low-sodium or  reduced-sodium). Regular canned tomato sauce and paste (not low-sodium or reduced-sodium). Regular tomato and vegetable juice (not low-sodium or reduced-sodium). Angie Fava. Olives. Fruits Canned fruit in a light or heavy syrup. Fried fruit. Fruit in cream or butter sauce. Meat and other protein foods Fatty cuts of meat. Ribs. Fried meat. Berniece Salines. Sausage. Bologna and other processed lunch meats. Salami. Fatback. Hotdogs. Bratwurst. Salted nuts and seeds. Canned beans with added salt. Canned or smoked fish. Whole eggs or egg yolks. Chicken or [place] with skin. Dairy Whole or 2% milk, cream, and half-and-half. Whole or full-fat cream cheese. Whole-fat or sweetened yogurt. Full-fat cheese. Nondairy creamers. Whipped toppings. Processed cheese and cheese spreads. Fats and oils Butter. Stick margarine. Lard. Shortening. Ghee. Bacon fat. Tropical oils, such as coconut, palm kernel, or palm oil. Seasoning and other foods Salted popcorn and pretzels. Onion salt, garlic salt, seasoned salt, table salt, and sea salt. Worcestershire sauce. Tartar sauce. Barbecue sauce. Teriyaki sauce. Soy sauce, including reduced-sodium. Steak sauce. Canned and packaged gravies. Fish sauce. Oyster sauce. Cocktail sauce. Horseradish that you find on the shelf. Ketchup. Mustard. Meat flavorings and tenderizers. Bouillon cubes. Hot sauce and Tabasco sauce. Premade or packaged marinades. Premade or packaged taco seasonings. Relishes. Regular salad dressings. Where to find more information:  National Heart, Lung, and [place]: https://wilson-eaton.com/  American Heart Association: www.heart.org Summary  The DASH eating plan is a healthy eating plan that has been shown to reduce high blood pressure (hypertension). It may also reduce your risk for type 2 diabetes, heart disease, and stroke.  With the DASH eating plan, you should limit salt (sodium) intake to 2,300 mg a day. If you have hypertension, you may need to reduce your sodium  intake to 1,500 mg a day.  When on the DASH eating plan, aim to eat more fresh fruits and vegetables, whole grains, lean proteins, low-fat dairy, and heart-healthy fats.  Work with your health care provider or diet and nutrition specialist (dietitian) to adjust your eating plan to your individual calorie needs. This information is not intended to replace advice given to you by your health care provider. Make sure you discuss any questions you have with your health care provider. Document Revised: 06/17/2017 Document Reviewed: 06/28/2016 Elsevier Patient Education  2020 [place], [place], [place]  09/26/2019 11:48 AM    [facility] [address], [place], [place]  [address] Phone: [phone]; Fax: [phone]

## 2019-09-26 ENCOUNTER — Encounter: Payer: Self-pay | Admitting: Physician Assistant

## 2019-09-26 ENCOUNTER — Ambulatory Visit (INDEPENDENT_AMBULATORY_CARE_PROVIDER_SITE_OTHER): Payer: Medicare Other | Admitting: Physician Assistant

## 2019-09-26 ENCOUNTER — Other Ambulatory Visit: Payer: Self-pay

## 2019-09-26 VITALS — BP 120/60 | HR 82 | Ht 60.0 in | Wt 145.4 lb

## 2019-09-26 DIAGNOSIS — R600 Localized edema: Secondary | ICD-10-CM | POA: Diagnosis not present

## 2019-09-26 DIAGNOSIS — I10 Essential (primary) hypertension: Secondary | ICD-10-CM

## 2019-09-26 DIAGNOSIS — M549 Dorsalgia, unspecified: Secondary | ICD-10-CM | POA: Diagnosis not present

## 2019-09-26 DIAGNOSIS — I2510 Atherosclerotic heart disease of native coronary artery without angina pectoris: Secondary | ICD-10-CM | POA: Diagnosis not present

## 2019-09-26 NOTE — Patient Instructions (Addendum)
Medication Instructions:  Your physician recommends that you continue on your current medications as directed. Please refer to the Current Medication list given to you today.  *If you need a refill on your cardiac medications before your next appointment, please call your pharmacy*   Lab Work: None ordered  If you have labs (blood work) drawn today and your tests are completely normal, you will receive your results only by: Marland Kitchen MyChart Message (if you have MyChart) OR . A paper copy in the mail If you have any lab test that is abnormal or we need to change your treatment, we will call you to review the results.   Testing/Procedures: None ordered   Follow-Up: At [facility], you and your health needs are our priority.  As part of our continuing mission to provide you with exceptional heart care, we have created designated Provider Care Teams.  These Care Teams include your primary Cardiologist (physician) and Advanced Practice Providers (APPs -  Physician Assistants and Nurse Practitioners) who all work together to provide you with the care you need, when you need it.  We recommend signing up for the patient portal called "MyChart".  Sign up information is provided on this After Visit Summary.  MyChart is used to connect with patients for Virtual Visits (Telemedicine).  Patients are able to view lab/test results, encounter notes, upcoming appointments, etc.  Non-urgent messages can be sent to your provider as well.   To learn more about what you can do with MyChart, go to [email or web address].    Your next appointment:   6 month(s)  The format for your next appointment:   In Person  Provider:   You may see Mertie Moores, MD or one of the following Advanced Practice Providers on your designated Care Team:    Richardson Dopp, PA-C  [place], [place]  Daune Perch, NP    Other Instructions  Keep your legs elevated  Wear compression stockings  DASH Eating Plan DASH  stands for "Dietary Approaches to Stop Hypertension." The DASH eating plan is a healthy eating plan that has been shown to reduce high blood pressure (hypertension). It may also reduce your risk for type 2 diabetes, heart disease, and stroke. The DASH eating plan may also help with weight loss. What are tips for following this plan?  General guidelines  Avoid eating more than 2,300 mg (milligrams) of salt (sodium) a day. If you have hypertension, you may need to reduce your sodium intake to 1,500 mg a day.  Limit alcohol intake to no more than 1 drink a day for nonpregnant women and 2 drinks a day for men. One drink equals 12 oz of beer, 5 oz of wine, or 1 oz of hard liquor.  Work with your health care provider to maintain a healthy body weight or to lose weight. Ask what an ideal weight is for you.  Get at least 30 minutes of exercise that causes your heart to beat faster (aerobic exercise) most days of the week. Activities may include walking, swimming, or biking.  Work with your health care provider or diet and nutrition specialist (dietitian) to adjust your eating plan to your individual calorie needs. Reading food labels   Check food labels for the amount of sodium per serving. Choose foods with less than 5 percent of the Daily Value of sodium. Generally, foods with less than 300 mg of sodium per serving fit into this eating plan.  To find whole grains, look for the word "whole" as  the first word in the ingredient list. Shopping  Buy products labeled as "low-sodium" or "no salt added."  Buy fresh foods. Avoid canned foods and premade or frozen meals. Cooking  Avoid adding salt when cooking. Use salt-free seasonings or herbs instead of table salt or sea salt. Check with your health care provider or pharmacist before using salt substitutes.  Do not fry foods. Cook foods using healthy methods such as baking, boiling, grilling, and broiling instead.  Cook with heart-healthy oils,  such as olive, canola, soybean, or sunflower oil. Meal planning  Eat a balanced diet that includes: ? 5 or more servings of fruits and vegetables each day. At each meal, try to fill half of your plate with fruits and vegetables. ? Up to 6-8 servings of whole grains each day. ? Less than 6 oz of lean meat, poultry, or fish each day. A 3-oz serving of meat is about the same size as a deck of cards. One egg equals 1 oz. ? 2 servings of low-fat dairy each day. ? A serving of nuts, seeds, or beans 5 times each week. ? Heart-healthy fats. Healthy fats called Omega-3 fatty acids are found in foods such as flaxseeds and coldwater fish, like sardines, salmon, and mackerel.  Limit how much you eat of the following: ? Canned or prepackaged foods. ? Food that is high in trans fat, such as fried foods. ? Food that is high in saturated fat, such as fatty meat. ? Sweets, desserts, sugary drinks, and other foods with added sugar. ? Full-fat dairy products.  Do not salt foods before eating.  Try to eat at least 2 vegetarian meals each week.  Eat more home-cooked food and less restaurant, buffet, and fast food.  When eating at a restaurant, ask that your food be prepared with less salt or no salt, if possible. What foods are recommended? The items listed may not be a complete list. Talk with your dietitian about what dietary choices are best for you. Grains Whole-grain or whole-wheat bread. Whole-grain or whole-wheat pasta. Brown rice. [place]. Bulgur. Whole-grain and low-sodium cereals. Pita bread. Low-fat, low-sodium crackers. Whole-wheat flour tortillas. Vegetables Fresh or frozen vegetables (raw, steamed, roasted, or grilled). Low-sodium or reduced-sodium tomato and vegetable juice. Low-sodium or reduced-sodium tomato sauce and tomato paste. Low-sodium or reduced-sodium canned vegetables. Fruits All fresh, dried, or frozen fruit. Canned fruit in natural juice (without added sugar). Meat  and other protein foods Skinless chicken or [place]. Ground chicken or [place]. Pork with fat trimmed off. Fish and seafood. Egg whites. Dried beans, peas, or lentils. Unsalted nuts, nut butters, and seeds. Unsalted canned beans. Lean cuts of beef with fat trimmed off. Low-sodium, lean deli meat. Dairy Low-fat (1%) or fat-free (skim) milk. Fat-free, low-fat, or reduced-fat cheeses. Nonfat, low-sodium ricotta or cottage cheese. Low-fat or nonfat yogurt. Low-fat, low-sodium cheese. Fats and oils Soft margarine without trans fats. Vegetable oil. Low-fat, reduced-fat, or light mayonnaise and salad dressings (reduced-sodium). Canola, safflower, olive, soybean, and sunflower oils. Avocado. Seasoning and other foods Herbs. Spices. Seasoning mixes without salt. Unsalted popcorn and pretzels. Fat-free sweets. What foods are not recommended? The items listed may not be a complete list. Talk with your dietitian about what dietary choices are best for you. Grains Baked goods made with fat, such as croissants, muffins, or some breads. Dry pasta or rice meal packs. Vegetables Creamed or fried vegetables. Vegetables in a cheese sauce. Regular canned vegetables (not low-sodium or reduced-sodium). Regular canned tomato sauce and paste (not  low-sodium or reduced-sodium). Regular tomato and vegetable juice (not low-sodium or reduced-sodium). Angie Fava. Olives. Fruits Canned fruit in a light or heavy syrup. Fried fruit. Fruit in cream or butter sauce. Meat and other protein foods Fatty cuts of meat. Ribs. Fried meat. Berniece Salines. Sausage. Bologna and other processed lunch meats. Salami. Fatback. Hotdogs. Bratwurst. Salted nuts and seeds. Canned beans with added salt. Canned or smoked fish. Whole eggs or egg yolks. Chicken or [place] with skin. Dairy Whole or 2% milk, cream, and half-and-half. Whole or full-fat cream cheese. Whole-fat or sweetened yogurt. Full-fat cheese. Nondairy creamers. Whipped toppings. Processed cheese and  cheese spreads. Fats and oils Butter. Stick margarine. Lard. Shortening. Ghee. Bacon fat. Tropical oils, such as coconut, palm kernel, or palm oil. Seasoning and other foods Salted popcorn and pretzels. Onion salt, garlic salt, seasoned salt, table salt, and sea salt. Worcestershire sauce. Tartar sauce. Barbecue sauce. Teriyaki sauce. Soy sauce, including reduced-sodium. Steak sauce. Canned and packaged gravies. Fish sauce. Oyster sauce. Cocktail sauce. Horseradish that you find on the shelf. Ketchup. Mustard. Meat flavorings and tenderizers. Bouillon cubes. Hot sauce and Tabasco sauce. Premade or packaged marinades. Premade or packaged taco seasonings. Relishes. Regular salad dressings. Where to find more information:  National Heart, Lung, and [place]: https://wilson-eaton.com/  American Heart Association: www.heart.org Summary  The DASH eating plan is a healthy eating plan that has been shown to reduce high blood pressure (hypertension). It may also reduce your risk for type 2 diabetes, heart disease, and stroke.  With the DASH eating plan, you should limit salt (sodium) intake to 2,300 mg a day. If you have hypertension, you may need to reduce your sodium intake to 1,500 mg a day.  When on the DASH eating plan, aim to eat more fresh fruits and vegetables, whole grains, lean proteins, low-fat dairy, and heart-healthy fats.  Work with your health care provider or diet and nutrition specialist (dietitian) to adjust your eating plan to your individual calorie needs. This information is not intended to replace advice given to you by your health care provider. Make sure you discuss any questions you have with your health care provider. Document Revised: 06/17/2017 Document Reviewed: 06/28/2016 Elsevier Patient Education  2020 [employer].

## 2019-12-04 ENCOUNTER — Emergency Department (HOSPITAL_COMMUNITY): Payer: Medicare Other

## 2019-12-04 ENCOUNTER — Other Ambulatory Visit: Payer: Self-pay

## 2019-12-04 ENCOUNTER — Encounter (HOSPITAL_COMMUNITY): Payer: Self-pay

## 2019-12-04 ENCOUNTER — Emergency Department (HOSPITAL_COMMUNITY)
Admission: EM | Admit: 2019-12-04 | Discharge: 2019-12-04 | Disposition: A | Discharge status: Home / Self Care | Payer: Medicare Other | Attending: Emergency Medicine | Admitting: Emergency Medicine

## 2019-12-04 DIAGNOSIS — K5900 Constipation, unspecified: Secondary | ICD-10-CM

## 2019-12-04 DIAGNOSIS — I10 Essential (primary) hypertension: Secondary | ICD-10-CM | POA: Insufficient documentation

## 2019-12-04 DIAGNOSIS — Z79899 Other long term (current) drug therapy: Secondary | ICD-10-CM | POA: Diagnosis not present

## 2019-12-04 DIAGNOSIS — E039 Hypothyroidism, unspecified: Secondary | ICD-10-CM | POA: Insufficient documentation

## 2019-12-04 DIAGNOSIS — Z7982 Long term (current) use of aspirin: Secondary | ICD-10-CM | POA: Insufficient documentation

## 2019-12-04 DIAGNOSIS — G8929 Other chronic pain: Secondary | ICD-10-CM | POA: Diagnosis present

## 2019-12-04 DIAGNOSIS — I2510 Atherosclerotic heart disease of native coronary artery without angina pectoris: Secondary | ICD-10-CM | POA: Diagnosis not present

## 2019-12-04 LAB — URINALYSIS, ROUTINE W REFLEX MICROSCOPIC
Bilirubin Urine: NEGATIVE
Glucose, UA: NEGATIVE mg/dL
Ketones, ur: 5 mg/dL — AB
Leukocytes,Ua: NEGATIVE
Nitrite: NEGATIVE
Protein, ur: NEGATIVE mg/dL
Specific Gravity, Urine: 1.002 — ABNORMAL LOW (ref 1.005–1.030)
pH: 7 (ref 5.0–8.0)

## 2019-12-04 LAB — CBC WITH DIFFERENTIAL/PLATELET
Abs Immature Granulocytes: 0.03 10*3/uL (ref 0.00–0.07)
Basophils Absolute: 0 10*3/uL (ref 0.0–0.1)
Basophils Relative: 1 %
Eosinophils Absolute: 0 10*3/uL (ref 0.0–0.5)
Eosinophils Relative: 1 %
HCT: 36.9 % (ref 36.0–46.0)
Hemoglobin: 11.6 g/dL — ABNORMAL LOW (ref 12.0–15.0)
Immature Granulocytes: 1 %
Lymphocytes Relative: 16 %
Lymphs Abs: 0.7 10*3/uL (ref 0.7–4.0)
MCH: 23.8 pg — ABNORMAL LOW (ref 26.0–34.0)
MCHC: 31.4 g/dL (ref 30.0–36.0)
MCV: 75.6 fL — ABNORMAL LOW (ref 80.0–100.0)
Monocytes Absolute: 0.6 10*3/uL (ref 0.1–1.0)
Monocytes Relative: 13 %
Neutro Abs: 3 10*3/uL (ref 1.7–7.7)
Neutrophils Relative %: 68 %
Platelets: 262 10*3/uL (ref 150–400)
RBC: 4.88 MIL/uL (ref 3.87–5.11)
RDW: 15.7 % — ABNORMAL HIGH (ref 11.5–15.5)
WBC: 4.4 10*3/uL (ref 4.0–10.5)
nRBC: 0 % (ref 0.0–0.2)

## 2019-12-04 LAB — COMPREHENSIVE METABOLIC PANEL
ALT: 13 U/L (ref 0–44)
AST: 26 U/L (ref 15–41)
Albumin: 3.9 g/dL (ref 3.5–5.0)
Alkaline Phosphatase: 69 U/L (ref 38–126)
Anion gap: 10 (ref 5–15)
BUN: 8 mg/dL (ref 8–23)
CO2: 28 mmol/L (ref 22–32)
Calcium: 9.7 mg/dL (ref 8.9–10.3)
Chloride: 97 mmol/L — ABNORMAL LOW (ref 98–111)
Creatinine, Ser: 0.63 mg/dL (ref 0.44–1.00)
GFR calc Af Amer: >60 mL/min (ref >60)
GFR calc non Af Amer: >60 mL/min (ref >60)
Glucose, Bld: 90 mg/dL (ref 70–99)
Potassium: 3.9 mmol/L (ref 3.5–5.1)
Sodium: 135 mmol/L (ref 135–145)
Total Bilirubin: 0.6 mg/dL (ref 0.3–1.2)
Total Protein: 7.2 g/dL (ref 6.5–8.1)

## 2019-12-04 LAB — TROPONIN I (HIGH SENSITIVITY): Troponin I (High Sensitivity): 5 ng/L (ref <18)

## 2019-12-04 LAB — LIPASE, BLOOD: Lipase: 19 U/L (ref 11–51)

## 2019-12-04 LAB — CK: Total CK: 148 U/L (ref 38–234)

## 2019-12-04 MED ORDER — SODIUM CHLORIDE 0.9 % IV BOLUS
500.0000 mL | Freq: Once | INTRAVENOUS | Status: AC
  Administered 2019-12-04: 500 mL via INTRAVENOUS

## 2019-12-04 MED ORDER — MORPHINE SULFATE (PF) 2 MG/ML IV SOLN
2.0000 mg | Freq: Once | INTRAVENOUS | Status: AC
  Administered 2019-12-04: 2 mg via INTRAVENOUS
  Filled 2019-12-04: qty 1

## 2019-12-04 MED ORDER — FLEET ENEMA 7-19 GM/118ML RE ENEM
1.0000 | ENEMA | Freq: Once | RECTAL | 0 refills | Status: AC
Start: 2019-12-04 — End: 2019-12-04

## 2019-12-04 MED ORDER — OLANZAPINE 2.5 MG PO TABS
2.5000 mg | ORAL_TABLET | Freq: Every day | ORAL | 0 refills | Status: DC
Start: 2019-12-04 — End: 2023-05-13

## 2019-12-04 NOTE — ED Provider Notes (Signed)
[facility] DEPT Provider Note   CSN: [id number] Arrival date & time: 12/04/19  0507     History Chief Complaint  Patient presents with  . Pain    Carlinda Dannette Mcnalley is a 86 y.o. female history of CAD, GERD, hypertension, hyperlipidemia, thyroid disease, osteoporosis, obesity.  Patient presents today for pain.  Patient describes pain all over her body primarily in the bilateral shoulders, neck, lower back and knees.  Pain has been ongoing for around 2 months, no clear inciting event.  Pain is worsened with movement and activity, temporarily relieved with Tylenol but an aching sensation moderate-severe in intensity, nonradiating.  Associated symptom is occasional  chest pain and shortness of breath only when her generalized pain is severe, she feels that when pain is at its worst she also has difficulty breathing that resolves with Tylenol.  Denies fever/chills, headache/vision changes, hemoptysis, abdominal pain, nausea/vomiting, diarrhea, numbness/weakness, tingling or any additional concerns. HPI     Past Medical History:  Diagnosis Date  . Anemia 06/02/2012  . Anginal pain ([place])    complained of chest pain last week lasting momentarily week of 04/02/2019 took reflux medication, then took a nitro and laid down and got relief  . Anxiety   . Arthritis   . CAD (coronary artery disease)    a. 2005 small inferoapical MI --> medical therapy b. 2007 - nomral coronaries c. 11/2015 mid to distal LAD 20% stenosis, 1st diag 30%, and 50%,--> medical therapy    . Cataracts, bilateral 09/11/2011   Per report of pt.  . Esophageal abnormality 09/11/2011   Surgically treated per pt.  Marland Kitchen GERD (gastroesophageal reflux disease)   . Headache(784.0)   . Hypercholesterolemia 09/11/2011  . Hyperlipemia   . Hypertension   . Hypothyroid 09/11/2011  . Hypothyroidism   . Insect bite of ankle, left 09/11/2011   back of ankle  . Kidney mass 09/11/2011   Per report of pt   . Leukocytopenia   . Liver mass 09/11/2011   Per report of pt  . Mental health disorder   . Myocardial infarction ([place]) 09/11/2011   Per pt in 2005 (small vessel)  . Osteoporosis 09/11/2011  . Pneumonia   . Thigh cramp 09/11/2011   Left thigh x 3 nights since starting unspecified psychotropic medication    Patient Active Problem List   Diagnosis Date Noted  . Chest pain 09/21/2019  . Anemia due to acute blood loss 04/14/2019    Class: Acute  . Status post total right knee replacement 04/13/2019  . Unilateral primary osteoarthritis, right knee 07/03/2018  . Precordial pain   . Chest pain in adult 12/08/2015  . Weight loss 06/07/2014  . Hyponatremia 10/02/2013  . Chronic cough 10/02/2013  . Anxiety disorder 10/02/2013  . Chronic constipation 10/02/2013  . Leukopenia 05/31/2013  . Microcytic anemia 05/31/2013  . Anemia of chronic illness 06/02/2012  . Bruising 01/13/2012  . Muscle ache 01/13/2012  . Hypothyroidism   . CAD (coronary artery disease)   . Leukocytopenia   . Chest pain 04/01/2011  . CONSTIPATION 08/08/2008  . COUGH 07/03/2007  . Depression 06/26/2007  . HYSTERECTOMY, HX OF 06/26/2007  . Other acquired absence of organ 06/26/2007  . COLON POLYP 09/15/2006  . Hyperlipidemia 09/15/2006  . OBESITY, NOS 09/15/2006  . Essential hypertension, benign 09/15/2006  . CORONARY, ARTERIOSCLEROSIS 09/15/2006  . RHINITIS, ALLERGIC 09/15/2006  . ASTHMA, UNSPECIFIED 09/15/2006  . GERD (gastroesophageal reflux disease) 09/15/2006  . DIVERTICULITIS OF COLON, NOS 09/15/2006  .  OSTEOARTHRITIS, MULTI SITES 09/15/2006  . INCONTINENCE, URGE 09/15/2006    Past Surgical History:  Procedure Laterality Date  . ABDOMINAL HYSTERECTOMY    . APPENDECTOMY    . BREAST BIOPSY     biopsy on R breast (benign)  . BREAST EXCISIONAL BIOPSY    . CARDIAC CATHETERIZATION N/A 12/10/2015   Procedure: Left Heart Cath and Coronary Angiography;  Surgeon: Burnell Blanks, MD;  Location: [facility];  Service: Cardiovascular;  Laterality: N/A;  . CARDIOVASCULAR STRESS TEST  08-15-2007   EF 59%  . CHOLECYSTECTOMY OPEN    . GALLBLADDER SURGERY  09/11/2011  . HYSTEROTOMY    . KNEE SURGERY  09/11/2011  . SHOULDER SURGERY  09/11/2011   Right side  . TONSILLECTOMY    . TOTAL KNEE ARTHROPLASTY Right 04/13/2019   Procedure: RIGHT TOTAL KNEE ARTHROPLASTY;  Surgeon: Mcarthur Rossetti, MD;  Location: [facility];  Service: Orthopedics;  Laterality: Right;  . US ECHOCARDIOGRAPHY  11/25/2015   Est EF 55-60%     OB History   No obstetric history on file.     Family History  Problem Relation Age of Onset  . Hypertension Sister   . Hypertension Brother     Social History   Tobacco Use  . Smoking status: Never Smoker  . Smokeless tobacco: Never Used  Substance Use Topics  . Alcohol use: No  . Drug use: No    Home Medications Prior to Admission medications   Medication Sig Start Date End Date Taking? Authorizing Provider  acetaminophen (TYLENOL) 500 MG tablet Take 1,000 mg by mouth every 6 (six) hours as needed for moderate pain.    Yes [provider]  amLODipine (NORVASC) 5 MG tablet Take 5 mg by mouth daily.  08/31/17  Yes [provider]  aspirin 81 MG chewable tablet Chew 81 mg by mouth daily.   Yes [provider]  Cholecalciferol 250 MCG (10000 UT) TABS Take 1,000 Units by mouth daily.    Yes [provider]  cloNIDine (CATAPRES) 0.1 MG tablet Take 0.1 mg by mouth daily.    Yes [provider]  docusate sodium (COLACE) 100 MG capsule Take 200 mg by mouth daily.    Yes [provider]  famotidine (PEPCID) 20 MG tablet Take 10 mg by mouth daily.    Yes [provider]  hydrochlorothiazide (HYDRODIURIL) 12.5 MG tablet Take 12.5 mg by mouth at bedtime.  05/28/17  Yes [provider]  levothyroxine (SYNTHROID, LEVOTHROID) 112 MCG tablet Take 112 mcg by mouth daily before breakfast.    Yes [provider]  LINZESS 145 MCG CAPS capsule Take 145 mcg by mouth daily.  11/22/13  Yes [provider]  Multiple Vitamin (MULTIVITAMIN WITH MINERALS) TABS tablet Take 1 tablet by mouth daily.   Yes [provider]  nitroGLYCERIN (NITROSTAT) 0.4 MG SL tablet Place 1 tablet (0.4 mg total) under the tongue every 5 (five) minutes x 3 doses as needed for chest pain. 12/11/15  Yes Cheryln Manly, NP  olmesartan (BENICAR) 40 MG tablet Take 40 mg by mouth daily.   Yes [provider]  polyethylene glycol (MIRALAX) packet Take 17 g by mouth 2 (two) times daily. 10/04/13  Yes Marton Redwood, MD  potassium chloride SA (K-DUR,KLOR-CON) 20 MEQ tablet Take 20 mEq by mouth daily.   Yes [provider]  Probiotic Product (ALIGN) 4 MG CAPS Take 4 mg by mouth daily.   Yes [provider]  traZODone (DESYREL) 50 MG tablet Take 50 mg by mouth at bedtime. 12/03/19  Yes [provider]  vitamin C (ASCORBIC ACID) 500 MG tablet Take 500 mg by mouth daily.   Yes [provider]  OLANZapine (ZYPREXA) 2.5 MG tablet Take 1 tablet (2.5 mg total) by mouth at bedtime. 12/04/19   Nuala Alpha A, PA-C  sodium phosphate (FLEET) 7-19 GM/118ML ENEM Place 133 mLs (1 enema total) rectally once for 1 dose. 12/04/19 12/04/19  Nuala Alpha A, PA-C  traMADol (ULTRAM) 50 MG tablet Take 1-2 tablets (50-100 mg total) by mouth at bedtime as needed. Patient not taking: Reported on 12/04/2019 08/30/19   Mcarthur Rossetti, MD    Allergies    Statins, Cholestatin, Rosuvastatin, Doxycycline, and Prednisone  Review of Systems   Review of Systems Ten systems are reviewed and are negative for acute change except as noted in the HPI  Physical Exam Updated Vital Signs BP (!) 153/77   Pulse 65   Temp 98.2 F (36.8 C) (Oral)   Resp 17   Ht 5' (1.524 m)   Wt 59 kg   SpO2 100%   BMI 25.39 kg/m   Physical Exam Constitutional:      General: She is not in acute  distress.    Appearance: Normal appearance. She is well-developed. She is not ill-appearing or diaphoretic.  HENT:     Head: Normocephalic and atraumatic.     Right Ear: External ear normal.     Left Ear: External ear normal.     Nose: Nose normal.  Eyes:     General: Vision grossly intact. Gaze aligned appropriately.     Pupils: Pupils are equal, round, and reactive to light.  Neck:     Trachea: Trachea and phonation normal. No tracheal deviation.  Cardiovascular:     Rate and Rhythm: Normal rate and regular rhythm.     Pulses: Normal pulses.  Pulmonary:     Effort: Pulmonary effort is normal. No respiratory distress.     Breath sounds: Normal breath sounds.  Abdominal:     General: There is no distension.     Palpations: Abdomen is soft.     Tenderness: There is no abdominal tenderness. There is no guarding or rebound.  Musculoskeletal:        General: Normal range of motion.     Cervical back: Normal range of motion.     Comments: No midline C/T/L spinal tenderness to palpation, no paraspinal muscle tenderness, no deformity, crepitus, or step-off noted. No sign of injury to the neck or back. = Well-healed surgical scars of bilateral knees without evidence of infection.  Appropriate range of motion and strength of all major joints given age.  Skin:    General: Skin is warm and dry.  Neurological:     Mental Status: She is alert.     GCS: GCS eye subscore is 4. GCS verbal subscore is 5. GCS motor subscore is 6.     Comments: Speech is clear and goal oriented, follows commands Major Cranial nerves without deficit, no facial droop Moves extremities without ataxia, coordination intact  Psychiatric:        Behavior: Behavior normal.     ED Results / Procedures / Treatments   Labs (all labs ordered are listed, but only abnormal results are displayed) Labs Reviewed  CBC WITH DIFFERENTIAL/PLATELET - Abnormal; Notable for the following components:      Result Value    Hemoglobin 11.6 (*)  MCV 75.6 (*)    MCH 23.8 (*)    RDW 15.7 (*)    All other components within normal limits  COMPREHENSIVE METABOLIC PANEL - Abnormal; Notable for the following components:   Chloride 97 (*)    All other components within normal limits  URINALYSIS, ROUTINE W REFLEX MICROSCOPIC - Abnormal; Notable for the following components:   Color, Urine STRAW (*)    Specific Gravity, Urine 1.002 (*)    Hgb urine dipstick SMALL (*)    Ketones, ur 5 (*)    Bacteria, UA RARE (*)    All other components within normal limits  CK  LIPASE, BLOOD  TROPONIN I (HIGH SENSITIVITY)    EKG None  Radiology DG Chest Portable 1 View  Result Date: 12/04/2019 CLINICAL DATA:  Chronic, intermittent diffuse chest pain. Cough. Some shortness of breath. EXAM: PORTABLE CHEST 1 VIEW COMPARISON:  09/21/2019 FINDINGS: Heart size near the upper limit of normal with a mild decrease in size. Tortuous and partially calcified thoracic aorta. Clear lungs with normal vascularity. Bilateral shoulder degenerative changes. Thoracic spine degenerative changes and mild scoliosis. IMPRESSION: No acute abnormality. Electronically Signed   By: Claudie Revering M.D.   On: 12/04/2019 08:25    Procedures Procedures (including critical care time)  Medications Ordered in ED Medications  sodium chloride 0.9 % bolus 500 mL (0 mLs Intravenous Stopped 12/04/19 1016)  morphine 2 MG/ML injection 2 mg (2 mg Intravenous Given 12/04/19 [id number])    ED Course  I have reviewed the triage vital signs and the nursing notes.  Pertinent labs & imaging results that were available during my care of the patient were reviewed by me and considered in my medical decision making (see chart for details).    MDM Rules/Calculators/A&P                      Additional History Obtained: 1. Nursing notes from this visit. 2. Prior ED visit on September 21, 2019 were patient was admitted for chest pain.  Per discharge note it appears this was felt to  be musculoskeletal due to back pain and shoulder pain with lifting arms. 3. Previous records within EMR system, cardiology visit from September 26, 2019, assessment and plan shows diagnosis of nonobstructive CAD, patient to continue aspirin.  Upper back neck and arm pain felt to be musculoskeletal, taking Tylenol and tramadol for pain, encourage PCP follow-up.  Hypertension hyperlipidemia stable.  Lower extremity edema encouraged DASH diet compression stockings and low-dose HCTZ. 4. Family, patient son at bedside corroborates patient's story.   I ordered, reviewed and interpreted labs which include: Troponin within normal limits.  Lipase within normal limits.  CK within normal limits.  Urinalysis shows small hemoglobin and 5 ketones, rare bacteria does not appear infectious, additionally doubt kidney stone.  CMP shows mild hypochloremia at 97 otherwise normal limits no emergent electrolyte derangement, evidence of kidney injury or acute elevation of LFTs.  CBC shows hemoglobin of 11.6 which is slightly improved from prior, no leukocytosis to suggest infection.   CXR: IMPRESSION:  No acute abnormality.   I independently visualized and interpreted imaging and agree with radiologist interpretation. ----- Patient was reassessed after receiving 2 mg morphine for pain control and a 500 mL fluid bolus.  She reports pain is improved she is feeling well.  Discussed case with Dr. Johnney Killian who is seeing patient. - Work-up is reassuring today, family at bedside and patient feel that symptoms may be secondary to patient recently  running out of her regular medications trazodone and olanzapine, they are asking for refill and will follow up with PCP.  Feel is a reasonable plan at this time.  On my reassessment patient is resting comfortably no acute distress.  Patient and her son report that the trazodone has been filled by the primary care provider and is ready to be picked up from their pharmacy.  Chart review shows  patient had been taking Zyprexa 2.5 mg tablet at bedtime, will refill this and have her follow-up with PCP. - At discharge patient mentions that she is having constipation she has been using 1 capful of MiraLAX a day to help with this problem.  She is asking for an enema.  I encourage patient to increase her MiraLAX use to 2 capfuls a day and increase her water intake.  Shared discussion making was made between patient, her son and myself.  Patient wishes to be discharged with a single Fleet enema prescription to attempt at home, she reports that she is still having stool every day and passing gas normally.  Do not feel there is any indication for imaging or further work-up in the ER at this time, patient is well-appearing no acute distress she will follow-up with her primary care provider.  At this time there does not appear to be any evidence of an acute emergency medical condition and the patient appears stable for discharge with appropriate outpatient follow up. Diagnosis was discussed with patient who verbalizes understanding of care plan and is agreeable to discharge. I have discussed return precautions with patient and son who verbalizes understanding. Patient encouraged to follow-up with their PCP. All questions answered.  Patient seen and evaluated by Dr. Vallery Ridge during this visit agrees with discharge outpatient follow-up.  Note: Portions of this report may have been transcribed using voice recognition software. Every effort was made to ensure accuracy; however, inadvertent computerized transcription errors may still be present. Final Clinical Impression(s) / ED Diagnoses Final diagnoses:  Other chronic pain  Constipation, unspecified constipation type    Rx / DC Orders ED Discharge Orders         Ordered    OLANZapine (ZYPREXA) 2.5 MG tablet  Daily at bedtime     12/04/19 1139    sodium phosphate (FLEET) 7-19 GM/118ML ENEM   Once     12/04/19 1139           Gari Crown 12/04/19 1141    Charlesetta Shanks, MD 12/05/19 [phone]

## 2019-12-04 NOTE — Discharge Instructions (Signed)
At this time there does not appear to be the presence of an emergent medical condition, however there is always the potential for conditions to change. Please read and follow the below instructions.  Please return to the Emergency Department immediately for any new or worsening symptoms. Please be sure to follow up with your Primary Care Provider within one week regarding your visit today; please call their office to schedule an appointment even if you are feeling better for a follow-up visit. You may take the medication olanzapine as prescribed.  Please see your primary care doctor this week for a recheck. You may increase your MiraLAX use to 2 capfuls a day and increase her water intake.  You have also been given a Fleet enema prescription that you may use if needed.  Get help right away if: You have a fever, and your symptoms suddenly get worse. You leak poop or have blood in your poop. Your belly feels hard or bigger than normal (is bloated). You have very bad belly pain. You feel dizzy or you faint. You have any new/concerning or worsening of symptoms  Please read the additional information packets attached to your discharge summary.  Do not take your medicine if  develop an itchy rash, swelling in your mouth or lips, or difficulty breathing; call 911 and seek immediate emergency medical attention if this occurs.  Note: Portions of this text may have been transcribed using voice recognition software. Every effort was made to ensure accuracy; however, inadvertent computerized transcription errors may still be present.

## 2019-12-04 NOTE — ED Triage Notes (Signed)
Pt c/o chronic pain all over. Seen by PCP earlier for same.

## 2020-02-04 ENCOUNTER — Telehealth: Payer: Self-pay | Admitting: *Deleted

## 2020-02-04 NOTE — Telephone Encounter (Signed)
   [facility] Pre-operative Risk Assessment    HEARTCARE STAFF: - Please ensure there is not already an duplicate clearance open for this procedure. - Under Visit Info/Reason for Call, type in Other and utilize the format Clearance MM/DD/YY or Clearance TBD. Do not use dashes or single digits. - If request is for dental extraction, please clarify the # of teeth to be extracted.  Request for surgical clearance:  1. What type of surgery is being performed? MAXILLARY ALVEOLOPLASTY, REDUCTION MAXILLARY TUBEROSITIES   2. When is this surgery scheduled? TBD   3. What type of clearance is required (medical clearance vs. Pharmacy clearance to hold med vs. Both)? MEDICAL  4. Are there any medications that need to be held prior to surgery and how long? ASA    5. Practice name and name of physician performing surgery? SCOTT JENSEN, D.M.D.; PA   6. What is the office phone number? [phone]   7.   What is the office fax number? [phone]  8.   Anesthesia type (None, local, MAC, general) ? GENERAL   Julaine Hua 02/04/2020, 12:31 PM  _________________________________________________________________   (provider comments below)

## 2020-02-04 NOTE — Telephone Encounter (Signed)
Called to discuss clearance- no answer  Kerin Ransom PA-C 02/04/2020 1:41 PM

## 2020-02-06 NOTE — Telephone Encounter (Signed)
[name] is a 86 y.o. female last seen in the cardiology clinic on 09/26/2019.  She was contacted 02/06/2020 and had no cardiac complaints.  May we hold aspirin for her upcoming maxillary surgery?   She has a PMH of mild CAD by cath in 2017, HLD, GERD s/p nissen fundoplication with chronic cough presents for follow up.   Cath 11/2015: 20% LAD; 30% 1st dig; 50% 2nd dig>> medical therapy.   Last seen by Dr. Acie Fredrickson 06/18/19.   Admitted 09/2019 with chest pain. Admitted and ruled out. Felt MSK in etiology.   09/26/19 follow up.  She continued to have neck/upper back and left arm pain which is worse with laying down and lifting arm.  She denied chest tightness/heaviness or shortness of breath.  Has chronic mild lower extremity edema and takes hydrochlorothiazide.  Patient reported prior history of significant dehydration on Lasix.   Please direct your response to [facility] preop pool.  Thank you for your help.  Jossie Ng. Dawnetta Copenhaver NP-C  02/06/2020, 12:14 PM [name] Office [phone] Fax [phone]

## 2020-02-07 NOTE — Telephone Encounter (Signed)
Pt has mild CAD She may hold ASA for 5-7 days prior to procedure She is at low risk for procedure

## 2020-02-07 NOTE — Telephone Encounter (Signed)
   Primary Cardiologist: Mertie Moores, MD  Chart reviewed as part of pre-operative protocol coverage. Given past medical history and time since last visit, based on ACC/AHA guidelines, [name] would be at acceptable risk for the planned procedure without further cardiovascular testing.   OK to hold aspirin 5-7 days pre op if needed.  Resume as soon as safe post op.  I will route this recommendation to the requesting party via Epic fax function and remove from pre-op pool.  Please call with questions.  Kerin Ransom, PA-C 02/07/2020, 8:20 AM

## 2020-02-18 ENCOUNTER — Ambulatory Visit (INDEPENDENT_AMBULATORY_CARE_PROVIDER_SITE_OTHER): Payer: Medicare Other | Admitting: Cardiovascular Disease

## 2020-02-18 ENCOUNTER — Other Ambulatory Visit: Payer: Self-pay

## 2020-02-18 ENCOUNTER — Encounter: Payer: Self-pay | Admitting: Cardiovascular Disease

## 2020-02-18 VITALS — BP 114/68 | HR 70 | Ht 61.0 in | Wt 132.0 lb

## 2020-02-18 DIAGNOSIS — I2510 Atherosclerotic heart disease of native coronary artery without angina pectoris: Secondary | ICD-10-CM

## 2020-02-18 NOTE — Telephone Encounter (Signed)
Pt is at low risk for surgery .  She may hold her ASA for 5-7 days prior to surgery .     Mertie Moores, MD  02/18/2020 11:54 AM    [address],  [place], [place]  [address] Phone: [phone]; Fax: [phone]

## 2020-02-18 NOTE — Progress Notes (Signed)
Cardiology Office Note   Date:  02/18/2020   ID:  [name], [name] 29-Mar-1932, MRN [id number]  PCP:  Caren Macadam, MD  Cardiologist:   Mertie Moores, MD   No chief complaint on file.  1. Minimal coronary artery disease 2. Hyperlipidemia 3. Chronic cough 4. Hypothyroidism 5. Status post Nissen fundoplication for acid reflux 6. Hypertension  Previous Notes.   [name] is an elderly female with a history of chest pain with minimal coronary artery disease. She has a history of hyperlipidemia chronic cough she status post Nissen fundoplication for acid reflux. She has a history of hypertension.  She's continued to have some episodes of chest discomfort. These are fairly atypical.  Dec. 4, 2013: She has been having upper abdominal / lower chest discomfort pain. [name] pain is eased with NTG. She does not think [name] symptoms are due to soft reflux. She stopped her proton pump inhibitor last year. She was concerned about side effects including osteoporosis.  Sept. 12, 2014:  She is doing well from a cardiac standpoint. She continues to have problems with GERD. She has had a nissan fundiplication - helped [name] cough but now she had recurrent GERD.   She has been gaining weight. Has some numbness on [name] lateral aspect of her right legs.   November 14, 2013:  She was recently hospitalized for generalized weakness and coughing. She was found to have hyponatremia. She has been feeling well. She bought some compression hose but they were too long ( ended at [name] bend of her knee) and she could not wear them. She is going to go back to [name] supply store to get a shorter pair. She denies any CP or dyspnea.   Oct. 26, 2015:  [name] is doing well.  No CP or dyspnea.   November 12, 2014:   [name] is a 86 y.o. female who presents for follow up of her HTN Has occasional palpitations.  Has had some leg pain  - especially in [name] morning.  Not worsened  with exercise .   Oct. 26, 2016:  Doing well from a cardiac stnandpoint.    November 10, 2015: Complains of being cold No CP or dyspnea.   Oct. 25, 2017:  [name] is seen today for follow up visit  Has a history of minimal coronary artery disease, hypertension and hyperlipidemia.  Had some left sided chest pain this weekend. Took 2 SL NTG - relief of [name] pain in 1/2 hour. She originally thought it was indigestion,   Tried a pepcid complete  Had a cath in May that revealed only minimal CAD BP is high today  Did not take her BP meds this am.    June 18, 2019: [name] seen today for a follow-up of her coronary artery disease, hypertension, hyperlipidemia.. Doing well   .  No cp and no dyspnea.  Has a frequent cough.  VS look good .   February 18, 2020:  [name] is seen today for follow-up visit.  She has a history of mild coronary artery disease, hypertension, hyperlipidemia. Needs to have some dental surgery .  Needs pre op clearence   No cp , no dyspnea  Has occasional heartburn relieved with Pepcid.  Was seen in [name] ER several months ago for CP / indigestion.  Ruled out for MI .   Past Medical History:  Diagnosis Date  . Anemia 06/02/2012  . Anginal pain ([place])    complained of chest pain last week lasting  momentarily week of 04/02/2019 took reflux medication, then took a nitro and laid down and got relief  . Anxiety   . Arthritis   . CAD (coronary artery disease)    a. 2005 small inferoapical MI --> medical therapy b. 2007 - nomral coronaries c. 11/2015 mid to distal LAD 20% stenosis, 1st diag 30%, and 50%,--> medical therapy    . Cataracts, bilateral 09/11/2011   Per report of pt.  . Esophageal abnormality 09/11/2011   Surgically treated per pt.  [name] Kitchen GERD (gastroesophageal reflux disease)   . Headache(784.0)   . Hypercholesterolemia 09/11/2011  . Hyperlipemia   . Hypertension   . Hypothyroid 09/11/2011  . Hypothyroidism   . Insect bite of ankle, left 09/11/2011    back of ankle  . Kidney mass 09/11/2011   Per report of pt  . Leukocytopenia   . Liver mass 09/11/2011   Per report of pt  . Mental health disorder   . Myocardial infarction ([place]) 09/11/2011   Per pt in 2005 (small vessel)  . Osteoporosis 09/11/2011  . Pneumonia   . Thigh cramp 09/11/2011   Left thigh x 3 nights since starting unspecified psychotropic medication    Past Surgical History:  Procedure Laterality Date  . ABDOMINAL HYSTERECTOMY    . APPENDECTOMY    . BREAST BIOPSY     biopsy on R breast (benign)  . BREAST EXCISIONAL BIOPSY    . CARDIAC CATHETERIZATION N/A 12/10/2015   Procedure: Left Heart Cath and Coronary Angiography;  Surgeon: Burnell Blanks, MD;  Location: [facility];  Service: Cardiovascular;  Laterality: N/A;  . CARDIOVASCULAR STRESS TEST  08-15-2007   EF 59%  . CHOLECYSTECTOMY OPEN    . GALLBLADDER SURGERY  09/11/2011  . HYSTEROTOMY    . KNEE SURGERY  09/11/2011  . SHOULDER SURGERY  09/11/2011   Right side  . TONSILLECTOMY    . TOTAL KNEE ARTHROPLASTY Right 04/13/2019   Procedure: RIGHT TOTAL KNEE ARTHROPLASTY;  Surgeon: Mcarthur Rossetti, MD;  Location: [facility];  Service: Orthopedics;  Laterality: Right;  . US ECHOCARDIOGRAPHY  11/25/2015   Est EF 55-60%     Current Outpatient Medications  Medication Sig Dispense Refill  . acetaminophen (TYLENOL) 500 MG tablet Take 1,000 mg by mouth every 6 (six) hours as needed for moderate pain.     [name] Kitchen amLODipine (NORVASC) 5 MG tablet Take 10 mg by mouth daily.     [name] Kitchen aspirin 81 MG chewable tablet Chew 81 mg by mouth daily.    . Cholecalciferol 250 MCG (10000 UT) TABS Take 1,000 Units by mouth daily.     . cloNIDine (CATAPRES) 0.1 MG tablet Take 0.1 mg by mouth daily.     [name] Kitchen docusate sodium (COLACE) 100 MG capsule Take 200 mg by mouth daily.     . famotidine (PEPCID) 20 MG tablet Take 10 mg by mouth daily.     [name] Kitchen levothyroxine (SYNTHROID, LEVOTHROID) 112 MCG tablet Take 112 mcg by mouth daily before  breakfast.     . LINZESS 145 MCG CAPS capsule Take 145 mcg by mouth daily.     . Multiple Vitamin (MULTIVITAMIN WITH MINERALS) TABS tablet Take 1 tablet by mouth daily.    . nitroGLYCERIN (NITROSTAT) 0.4 MG SL tablet Place 1 tablet (0.4 mg total) under [name] tongue every 5 (five) minutes x 3 doses as needed for chest pain. 25 tablet 3  . OLANZapine (ZYPREXA) 2.5 MG tablet Take 1 tablet (2.5 mg total) by mouth at  bedtime. 20 tablet 0  . olmesartan (BENICAR) 40 MG tablet Take 40 mg by mouth daily.    . polyethylene glycol (MIRALAX) packet Take 17 g by mouth 2 (two) times daily. 60 packet 6  . Probiotic Product (ALIGN) 4 MG CAPS Take 4 mg by mouth daily.    . rosuvastatin (CRESTOR) 5 MG tablet Take 5 mg by mouth daily.    . traMADol (ULTRAM) 50 MG tablet Take 1-2 tablets (50-100 mg total) by mouth at bedtime as needed. 40 tablet 0  . traZODone (DESYREL) 50 MG tablet Take 50 mg by mouth at bedtime.    . vitamin C (ASCORBIC ACID) 500 MG tablet Take 500 mg by mouth daily.     No current facility-administered medications for this visit.    Allergies:   Statins, Cholestatin, Rosuvastatin, Doxycycline, and Prednisone    Social History:  [name] patient  reports that she has never smoked. She has never used smokeless tobacco. She reports that she does not drink alcohol and does not use drugs.   [name] History:  [name] history includes Hypertension in her brother and sister.    ROS:  Please see [name] history of present illness.      All other systems are reviewed and negative.   Physical Exam: Blood pressure 114/68, pulse 70, height 5\' 1"  (1.549 m), weight 132 lb (59.9 kg), SpO2 99 %.  GEN:  Elderly female,  NAD  HEENT: Normal NECK: No JVD; no carotid bruits  LYMPHATICS: No lymphadenopathy CARDIAC: RR , soft systolic murmur  RESPIRATORY: clear  ABDOMEN: Soft, non-tender, non-distended MUSCULOSKELETAL:  No significant edema   SKIN: Warm and dry NEUROLOGIC:  Alert and oriented x  3   EKG:   Dec 04, 2019:   NSR at 75.  No ST or T wave changes.   Recent Labs: 09/21/2019: Magnesium 2.0 12/04/2019: ALT 13; BUN 8; Creatinine, Ser 0.63; Hemoglobin 11.6; Platelets 262; Potassium 3.9; Sodium 135    Lipid Panel    Component Value Date/Time   CHOL 159 09/21/2019 1545   TRIG 29 09/21/2019 1545   HDL 70 09/21/2019 1545   CHOLHDL 2.3 09/21/2019 1545   VLDL 6 09/21/2019 1545   LDLCALC 83 09/21/2019 1545   LDLDIRECT 109.5 03/29/2011 1007      Wt Readings from Last 3 Encounters:  02/18/20 132 lb (59.9 kg)  12/04/19 130 lb (59 kg)  09/26/19 145 lb 6.4 oz (66 kg)      Other studies Reviewed: Additional studies/ records that were reviewed today include: . Review of [name] above records demonstrates:    ASSESSMENT AND PLAN:  1. Minimal coronary artery disease by cath in May, 2017 no angina,   Overall has been very stable  She needs to have oral surgery to be fit for dentures..   She is at low risk for her upcoming surgery .   2. Hyperlipidemia -managed by her primary medical doctor.  3. Chronic cough:   She has a chronic cough.  She is stable.  4. Hypothyroidism 5. Status post Nissen fundoplication for acid reflux  6. Hypertension -  BP is well controlled.   Current medicines are reviewed at length with [name] patient today.  [name] patient does not have concerns regarding medicines.  [name] following changes have been made:  no change  Labs/ tests ordered today include:  No orders of [name] defined types were placed in this encounter.   Disposition:   FU with me in 1 year  Signed, Mertie Moores, MD  02/18/2020 11:41 AM    [facility] [place], [place], [place]  [address] Phone: [phone]; Fax: [phone]

## 2020-02-18 NOTE — Patient Instructions (Signed)
Medication Instructions:  Your provider recommends that you continue on your current medications as directed. Please refer to the Current Medication list given to you today.   *If you need a refill on your cardiac medications before your next appointment, please call your pharmacy*   Follow-Up: At [facility], you and your health needs are our priority.  As part of our continuing mission to provide you with exceptional heart care, we have created designated Provider Care Teams.  These Care Teams include your primary Cardiologist (physician) and Advanced Practice Providers (APPs -  Physician Assistants and Nurse Practitioners) who all work together to provide you with the care you need, when you need it. Your next appointment:   12 month(s) The format for your next appointment:   In Person Provider:   You may see Mertie Moores, MD or one of the following Advanced Practice Providers on your designated Care Team:    Richardson Dopp, PA-C  Robbie Lis, [place]     Dr. Acie Fredrickson has cleared you for your oral surgery!

## 2020-02-27 ENCOUNTER — Encounter: Payer: Self-pay | Admitting: Orthopaedic Surgery

## 2020-02-27 ENCOUNTER — Ambulatory Visit (INDEPENDENT_AMBULATORY_CARE_PROVIDER_SITE_OTHER): Payer: Medicare Other | Admitting: Orthopaedic Surgery

## 2020-02-27 DIAGNOSIS — Z96651 Presence of right artificial knee joint: Secondary | ICD-10-CM

## 2020-02-27 MED ORDER — TRAMADOL HCL 50 MG PO TABS: 50.0000 mg | ORAL_TABLET | Freq: Every evening | ORAL | 0 refills | Status: DC | PRN

## 2020-02-27 NOTE — Progress Notes (Signed)
Office Visit Note   Patient: [name]           Date of Birth: 1932-05-23           MRN: [id number] Visit Date: 02/27/2020              Requested by: Marton Redwood, MD [address] [place],  [place] [address] PCP: Caren Macadam, MD   Assessment & Plan: Visit Diagnoses:  1. Status post total right knee replacement     Plan: We will have her continue to work on quad strengthening.  Discussed with her that if in fact she does have rheumatoid arthritis this may explain the pain that she is having.  See her back in 6 months at that time AP and lateral views of both knees.  Questions encouraged and answered at length.  Did give her some tramadol pain that she is unable to take NSAIDs and Tylenol is not helping significantly with her pain at this point.  She is to use the tramadol sparingly.  Follow-Up Instructions: Return in about 6 months (around 08/29/2020) for Radiographs.   Orders:  No orders of the defined types were placed in this encounter.  Meds ordered this encounter  Medications  . traMADol (ULTRAM) 50 MG tablet    Sig: Take 1-2 tablets (50-100 mg total) by mouth at bedtime as needed.    Dispense:  40 tablet    Refill:  0      Procedures: No procedures performed   Clinical Data: No additional findings.   Subjective: Chief Complaint  Patient presents with  . Right Knee - Follow-up    HPI [name] returns today follow-up status post right total knee arthroplasty 04/13/2019.  She still ambulating with a rolling walker.  Still has pain at times throbbing pain in both knees.  She states she is being worked up for possible rheumatoid arthritis and is waiting the results from the lab work that was drawn recently.  She continues to work on strengthening both knees. Review of Systems Negative for fevers chills.  Objective: Vital Signs: There were no vitals taken for this visit.  Physical Exam Constitutional:      Appearance: She is not  ill-appearing or diaphoretic.  Pulmonary:     Effort: Pulmonary effort is normal.  Neurological:     Mental Status: She is alert.  Psychiatric:        Mood and Affect: Mood normal.     Ortho Exam Bilateral knees good range of motion in both knees actively and passively.  Right knee no instability valgus varus stressing.  Surgical incision right knee is healing well.  Right knee no abnormal warmth erythema or effusion.  Specialty Comments:  No specialty comments available.  Imaging: No results found.   PMFS History: Patient Active Problem List   Diagnosis Date Noted  . Chest pain 09/21/2019  . Anemia due to acute blood loss 04/14/2019    Class: Acute  . Status post total right knee replacement 04/13/2019  . Unilateral primary osteoarthritis, right knee 07/03/2018  . Precordial pain   . Chest pain in adult 12/08/2015  . Weight loss 06/07/2014  . Hyponatremia 10/02/2013  . Chronic cough 10/02/2013  . Anxiety disorder 10/02/2013  . Chronic constipation 10/02/2013  . Leukopenia 05/31/2013  . Microcytic anemia 05/31/2013  . Anemia of chronic illness 06/02/2012  . Bruising 01/13/2012  . Muscle ache 01/13/2012  . Hypothyroidism   . CAD (coronary artery disease)   . Leukocytopenia   .  Chest pain 04/01/2011  . CONSTIPATION 08/08/2008  . COUGH 07/03/2007  . Depression 06/26/2007  . HYSTERECTOMY, HX OF 06/26/2007  . Other acquired absence of organ 06/26/2007  . COLON POLYP 09/15/2006  . Hyperlipidemia 09/15/2006  . OBESITY, NOS 09/15/2006  . Essential hypertension, benign 09/15/2006  . CORONARY, ARTERIOSCLEROSIS 09/15/2006  . RHINITIS, ALLERGIC 09/15/2006  . ASTHMA, UNSPECIFIED 09/15/2006  . GERD (gastroesophageal reflux disease) 09/15/2006  . DIVERTICULITIS OF COLON, NOS 09/15/2006  . OSTEOARTHRITIS, MULTI SITES 09/15/2006  . INCONTINENCE, URGE 09/15/2006   Past Medical History:  Diagnosis Date  . Anemia 06/02/2012  . Anginal pain ([place])    complained of chest pain  last week lasting momentarily week of 04/02/2019 took reflux medication, then took a nitro and laid down and got relief  . Anxiety   . Arthritis   . CAD (coronary artery disease)    a. 2005 small inferoapical MI --> medical therapy b. 2007 - nomral coronaries c. 11/2015 mid to distal LAD 20% stenosis, 1st diag 30%, and 50%,--> medical therapy    . Cataracts, bilateral 09/11/2011   Per report of pt.  . Esophageal abnormality 09/11/2011   Surgically treated per pt.  Marland Kitchen GERD (gastroesophageal reflux disease)   . Headache(784.0)   . Hypercholesterolemia 09/11/2011  . Hyperlipemia   . Hypertension   . Hypothyroid 09/11/2011  . Hypothyroidism   . Insect bite of ankle, left 09/11/2011   back of ankle  . Kidney mass 09/11/2011   Per report of pt  . Leukocytopenia   . Liver mass 09/11/2011   Per report of pt  . Mental health disorder   . Myocardial infarction ([place]) 09/11/2011   Per pt in 2005 (small vessel)  . Osteoporosis 09/11/2011  . Pneumonia   . Thigh cramp 09/11/2011   Left thigh x 3 nights since starting unspecified psychotropic medication    Family History  Problem Relation Age of Onset  . Hypertension Sister   . Hypertension Brother     Past Surgical History:  Procedure Laterality Date  . ABDOMINAL HYSTERECTOMY    . APPENDECTOMY    . BREAST BIOPSY     biopsy on R breast (benign)  . BREAST EXCISIONAL BIOPSY    . CARDIAC CATHETERIZATION N/A 12/10/2015   Procedure: Left Heart Cath and Coronary Angiography;  Surgeon: Burnell Blanks, MD;  Location: [facility];  Service: Cardiovascular;  Laterality: N/A;  . CARDIOVASCULAR STRESS TEST  08-15-2007   EF 59%  . CHOLECYSTECTOMY OPEN    . GALLBLADDER SURGERY  09/11/2011  . HYSTEROTOMY    . KNEE SURGERY  09/11/2011  . SHOULDER SURGERY  09/11/2011   Right side  . TONSILLECTOMY    . TOTAL KNEE ARTHROPLASTY Right 04/13/2019   Procedure: RIGHT TOTAL KNEE ARTHROPLASTY;  Surgeon: Mcarthur Rossetti, MD;  Location: [facility];   Service: Orthopedics;  Laterality: Right;  . US ECHOCARDIOGRAPHY  11/25/2015   Est EF 55-60%   Social History   Occupational History  . Not on file  Tobacco Use  . Smoking status: Never Smoker  . Smokeless tobacco: Never Used  Vaping Use  . Vaping Use: Never used  Substance and Sexual Activity  . Alcohol use: No  . Drug use: No  . Sexual activity: Not Currently

## 2020-07-30 DIAGNOSIS — M2550 Pain in unspecified joint: Secondary | ICD-10-CM | POA: Diagnosis not present

## 2020-07-30 DIAGNOSIS — R5382 Chronic fatigue, unspecified: Secondary | ICD-10-CM | POA: Diagnosis not present

## 2020-07-30 DIAGNOSIS — M0609 Rheumatoid arthritis without rheumatoid factor, multiple sites: Secondary | ICD-10-CM | POA: Diagnosis not present

## 2020-09-01 ENCOUNTER — Ambulatory Visit: Payer: Self-pay

## 2020-09-01 ENCOUNTER — Encounter: Payer: Self-pay | Admitting: Orthopaedic Surgery

## 2020-09-01 ENCOUNTER — Ambulatory Visit (INDEPENDENT_AMBULATORY_CARE_PROVIDER_SITE_OTHER): Payer: Medicare Other | Admitting: Orthopaedic Surgery

## 2020-09-01 DIAGNOSIS — Z96651 Presence of right artificial knee joint: Secondary | ICD-10-CM | POA: Diagnosis not present

## 2020-09-01 DIAGNOSIS — Z96652 Presence of left artificial knee joint: Secondary | ICD-10-CM

## 2020-09-01 NOTE — Progress Notes (Signed)
The patient has a history of bilateral knee replacements.  Her right knee we replaced 17 months ago.  The left knee was replaced in 2002 by Dr. Marlou Sa.  She is 87 years old.  She ambulates with a rolling walker.  She says both knees hurt from time to time and bother her some.  She is very slow to mobilize in general.  She tries to walk a mile when she can in the hallways of where she lives.  Both knees were examined today and showed no effusion.  Both knees are cool and have good range of motion.  Both knees feel stable on exam.  An AP and lateral both knees show well-seated implants on both sides with no evidence of loosening.  There is narrowing of the space on the left side suggesting polyliner wear.  Given her limited mobility I would not recommend any other intervention for now.  I have shown her quad strengthening exercises to try that she demonstrated back to me.  She cannot get outpatient therapy.  She will continue to work on activity modification and can follow-up as needed at this standpoint.  All question concerns were answered and addressed.

## 2020-09-28 IMAGING — DX DG CHEST 1V PORT
1 series · 1 of 1 positions shown · non-contrast
Comparison: PA and lateral chest 12/08/2015.

CLINICAL DATA: Chest pain.

EXAM:
PORTABLE CHEST 1 VIEW

[chest ap]
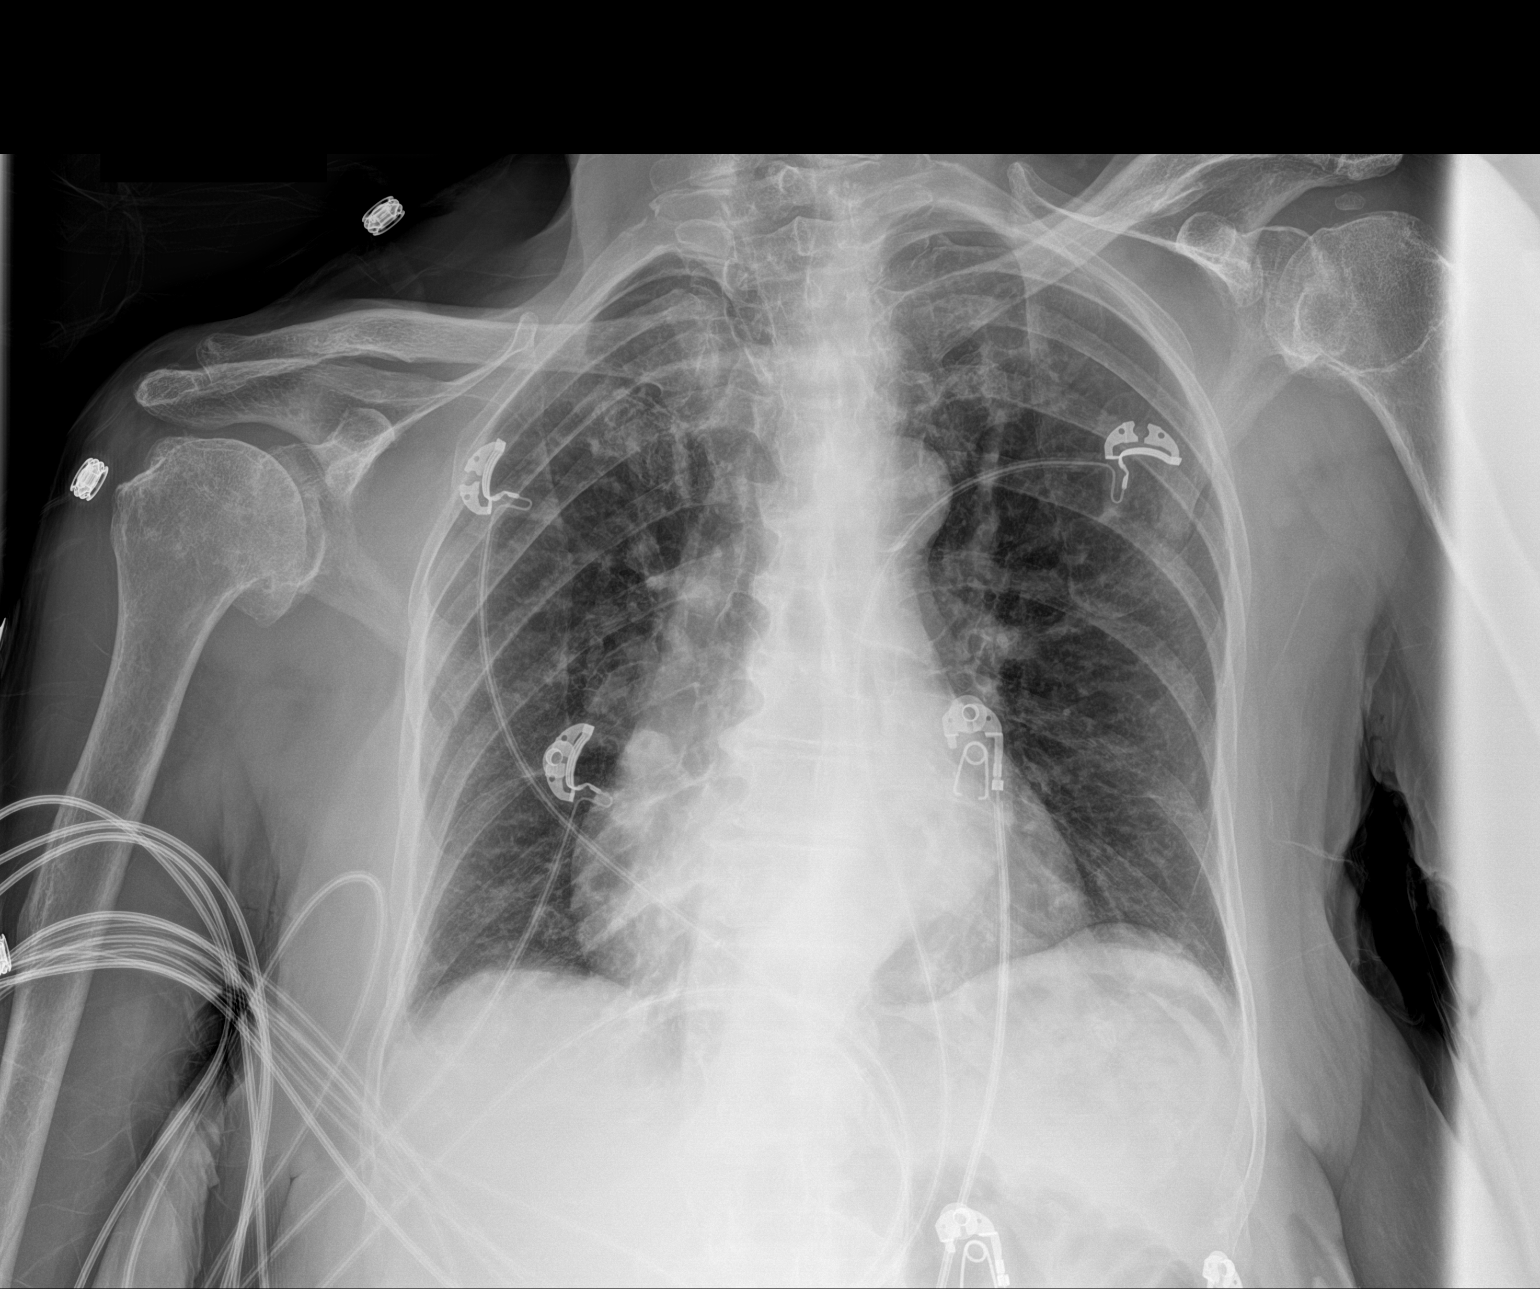

[1 of 1 positions shown; findings below may reference images not displayed]

FINDINGS: Lungs clear. Mild cardiomegaly. Atherosclerosis. No pneumothorax or
pleural effusion.
IMPRESSION: No acute disease.

Mild cardiomegaly.

Atherosclerosis.

## 2020-10-14 ENCOUNTER — Other Ambulatory Visit: Payer: Self-pay | Admitting: Family Medicine

## 2020-10-14 DIAGNOSIS — Z1231 Encounter for screening mammogram for malignant neoplasm of breast: Secondary | ICD-10-CM

## 2020-11-04 DIAGNOSIS — M2550 Pain in unspecified joint: Secondary | ICD-10-CM | POA: Diagnosis not present

## 2020-11-04 DIAGNOSIS — R5382 Chronic fatigue, unspecified: Secondary | ICD-10-CM | POA: Diagnosis not present

## 2020-11-04 DIAGNOSIS — M0609 Rheumatoid arthritis without rheumatoid factor, multiple sites: Secondary | ICD-10-CM | POA: Diagnosis not present

## 2020-11-28 ENCOUNTER — Ambulatory Visit
Admission: RE | Admit: 2020-11-28 | Discharge: 2020-11-28 | Disposition: A | Discharge status: Home / Self Care | Payer: Medicare Other | Source: Ambulatory Visit | Attending: Family Medicine | Admitting: Family Medicine

## 2020-11-28 ENCOUNTER — Other Ambulatory Visit: Payer: Self-pay

## 2020-11-28 DIAGNOSIS — Z1231 Encounter for screening mammogram for malignant neoplasm of breast: Secondary | ICD-10-CM

## 2020-12-03 DIAGNOSIS — I2510 Atherosclerotic heart disease of native coronary artery without angina pectoris: Secondary | ICD-10-CM | POA: Diagnosis not present

## 2020-12-03 DIAGNOSIS — M8580 Other specified disorders of bone density and structure, unspecified site: Secondary | ICD-10-CM | POA: Diagnosis not present

## 2020-12-03 DIAGNOSIS — I10 Essential (primary) hypertension: Secondary | ICD-10-CM | POA: Diagnosis not present

## 2020-12-03 DIAGNOSIS — Z23 Encounter for immunization: Secondary | ICD-10-CM | POA: Diagnosis not present

## 2020-12-03 DIAGNOSIS — Z0000 Encounter for general adult medical examination without abnormal findings: Secondary | ICD-10-CM | POA: Diagnosis not present

## 2020-12-03 DIAGNOSIS — K219 Gastro-esophageal reflux disease without esophagitis: Secondary | ICD-10-CM | POA: Diagnosis not present

## 2020-12-03 DIAGNOSIS — J302 Other seasonal allergic rhinitis: Secondary | ICD-10-CM | POA: Diagnosis not present

## 2020-12-03 DIAGNOSIS — E7800 Pure hypercholesterolemia, unspecified: Secondary | ICD-10-CM | POA: Diagnosis not present

## 2020-12-03 DIAGNOSIS — E039 Hypothyroidism, unspecified: Secondary | ICD-10-CM | POA: Diagnosis not present

## 2020-12-03 DIAGNOSIS — M8949 Other hypertrophic osteoarthropathy, multiple sites: Secondary | ICD-10-CM | POA: Diagnosis not present

## 2020-12-03 DIAGNOSIS — K5909 Other constipation: Secondary | ICD-10-CM | POA: Diagnosis not present

## 2020-12-05 ENCOUNTER — Other Ambulatory Visit: Payer: Self-pay | Admitting: Family Medicine

## 2020-12-05 DIAGNOSIS — M8580 Other specified disorders of bone density and structure, unspecified site: Secondary | ICD-10-CM

## 2020-12-09 DIAGNOSIS — K5904 Chronic idiopathic constipation: Secondary | ICD-10-CM | POA: Diagnosis not present

## 2021-01-15 DIAGNOSIS — K5904 Chronic idiopathic constipation: Secondary | ICD-10-CM | POA: Diagnosis not present

## 2021-01-15 DIAGNOSIS — K219 Gastro-esophageal reflux disease without esophagitis: Secondary | ICD-10-CM | POA: Diagnosis not present

## 2021-01-29 ENCOUNTER — Ambulatory Visit
Admission: RE | Admit: 2021-01-29 | Discharge: 2021-01-29 | Disposition: A | Discharge status: Home / Self Care | Payer: Medicare Other | Source: Ambulatory Visit | Attending: Family Medicine | Admitting: Family Medicine

## 2021-01-29 ENCOUNTER — Other Ambulatory Visit: Payer: Self-pay

## 2021-01-29 DIAGNOSIS — M8589 Other specified disorders of bone density and structure, multiple sites: Secondary | ICD-10-CM | POA: Diagnosis not present

## 2021-01-29 DIAGNOSIS — Z780 Asymptomatic menopausal state: Secondary | ICD-10-CM | POA: Diagnosis not present

## 2021-01-29 DIAGNOSIS — M8580 Other specified disorders of bone density and structure, unspecified site: Secondary | ICD-10-CM

## 2021-02-03 DIAGNOSIS — R5382 Chronic fatigue, unspecified: Secondary | ICD-10-CM | POA: Diagnosis not present

## 2021-02-03 DIAGNOSIS — M2550 Pain in unspecified joint: Secondary | ICD-10-CM | POA: Diagnosis not present

## 2021-02-03 DIAGNOSIS — M0609 Rheumatoid arthritis without rheumatoid factor, multiple sites: Secondary | ICD-10-CM | POA: Diagnosis not present

## 2021-02-20 ENCOUNTER — Encounter: Payer: Self-pay | Admitting: Cardiovascular Disease

## 2021-02-20 ENCOUNTER — Ambulatory Visit (INDEPENDENT_AMBULATORY_CARE_PROVIDER_SITE_OTHER): Payer: Medicare Other | Admitting: Cardiovascular Disease

## 2021-02-20 ENCOUNTER — Other Ambulatory Visit: Payer: Self-pay

## 2021-02-20 VITALS — BP 136/70 | HR 76 | Ht 61.0 in

## 2021-02-20 DIAGNOSIS — I10 Essential (primary) hypertension: Secondary | ICD-10-CM

## 2021-02-20 DIAGNOSIS — M79671 Pain in right foot: Secondary | ICD-10-CM | POA: Diagnosis not present

## 2021-02-20 DIAGNOSIS — M79605 Pain in left leg: Secondary | ICD-10-CM

## 2021-02-20 DIAGNOSIS — R600 Localized edema: Secondary | ICD-10-CM | POA: Diagnosis not present

## 2021-02-20 DIAGNOSIS — M79672 Pain in left foot: Secondary | ICD-10-CM | POA: Diagnosis not present

## 2021-02-20 DIAGNOSIS — I2510 Atherosclerotic heart disease of native coronary artery without angina pectoris: Secondary | ICD-10-CM | POA: Diagnosis not present

## 2021-02-20 DIAGNOSIS — M79604 Pain in right leg: Secondary | ICD-10-CM | POA: Diagnosis not present

## 2021-02-20 NOTE — Progress Notes (Signed)
Cardiology Office Note   Date:  02/20/2021   ID:  Udy, Pore [birth year], MRN [id number]  PCP:  Caren Macadam, MD  Cardiologist:   Mertie Moores, MD   Chief Complaint  Patient presents with   Coronary Artery Disease   Hypertension   1. Minimal coronary artery disease 2. Hyperlipidemia 3. Chronic cough 4. Hypothyroidism 5. Status post Nissen fundoplication for acid reflux 6. Hypertension  Previous Notes.   Adrionna is an elderly female with a history of chest pain with minimal coronary artery disease. She has a history of hyperlipidemia chronic cough she status post Nissen fundoplication for acid reflux. She has a history of hypertension.  She's continued to have some episodes of chest discomfort. These are fairly atypical.  Dec. 4, 2013: She has been having upper abdominal / lower chest discomfort pain.  The pain is eased with NTG.  She does not think the symptoms are due to soft reflux. She stopped her proton pump inhibitor last year. She was  concerned about side effects including osteoporosis.  Sept. 12, 2014:  She is doing well from a cardiac standpoint.  She continues to have problems with GERD. She has had a nissan fundiplication - helped the cough but now she had recurrent GERD.    She has been gaining weight.  Has some numbness on the lateral aspect of her right legs.   November 14, 2013:  She was recently hospitalized for generalized weakness and coughing.  She was found to have hyponatremia.  She has been feeling well.  She bought some compression hose but they were too long ( ended at the bend of her knee) and she could not wear them.  She is going to go back to the supply store to get a shorter pair.   She denies any CP or dyspnea.    Oct. 26, 2015:  Shazia is doing well.    No CP or dyspnea.   November 12, 2014:   Breslyn Nayla Diponio is a 87 y.o. female who presents for follow up of her HTN Has occasional palpitations.  Has had some  leg pain  - especially in the morning.  Not worsened with exercise .   Oct. 26, 2016:  Doing well from a cardiac stnandpoint.    November 10, 2015: Complains of being cold No CP or dyspnea.   Oct. 25, 2017:  Cyleigh is seen today for follow up visit  Has a history of minimal coronary artery disease, hypertension and hyperlipidemia.  Had some left sided chest pain this weekend. Took 2 SL NTG - relief of the pain in 1/2 hour. She originally thought it was indigestion,   Tried a pepcid complete  Had a cath in May that revealed only minimal CAD BP is high today  Did not take her BP meds this am.    June 18, 2019: Mrs. Wist seen today for a follow-up of her coronary artery disease, hypertension, hyperlipidemia.. Doing well   .  No cp and no dyspnea.  Has a frequent cough.  VS look good .   February 18, 2020:  [name] is seen today for follow-up visit.  She has a history of mild coronary artery disease, hypertension, hyperlipidemia. Needs to have some dental surgery .  Needs pre op clearence   No cp , no dyspnea  Has occasional heartburn relieved with Pepcid.  Was seen in the ER several months ago for CP / indigestion.  Ruled out for MI .  Aug. 5, 2022:  Fritzi is seen for follow up she has a history of mild nonobstructive coronary artery disease.  She has a history of hypertension hyperlipidemia.  She also has spastic dysphonia. Has chronic pain in her feet ,  feet feel like they are frozen  Has chronic edema - resolves every morning and then accumulates through the day   Will send for screening ABI   Past Medical History:  Diagnosis Date   Anemia 06/02/2012   Anginal pain (HCC)    complained of chest pain last week lasting momentarily week of 04/02/2019 took reflux medication, then took a nitro and laid down and got relief   Anxiety    Arthritis    CAD (coronary artery disease)    a. 2005 small inferoapical MI --> medical therapy b. 2007 - nomral coronaries c.  11/2015 mid to distal LAD 20% stenosis, 1st diag 30%, and 50%,--> medical therapy     Cataracts, bilateral 09/11/2011   Per report of pt.   Esophageal abnormality 09/11/2011   Surgically treated per pt.   GERD (gastroesophageal reflux disease)    Headache(784.0)    Hypercholesterolemia 09/11/2011   Hyperlipemia    Hypertension    Hypothyroid 09/11/2011   Hypothyroidism    Insect bite of ankle, left 09/11/2011   back of ankle   Kidney mass 09/11/2011   Per report of pt   Leukocytopenia    Liver mass 09/11/2011   Per report of pt   Mental health disorder    Myocardial infarction ([place]) 09/11/2011   Per pt in 2005 (small vessel)   Osteoporosis 09/11/2011   Pneumonia    Thigh cramp 09/11/2011   Left thigh x 3 nights since starting unspecified psychotropic medication    Past Surgical History:  Procedure Laterality Date   ABDOMINAL HYSTERECTOMY     APPENDECTOMY     BREAST BIOPSY     biopsy on R breast (benign)   BREAST EXCISIONAL BIOPSY     CARDIAC CATHETERIZATION N/A 12/10/2015   Procedure: Left Heart Cath and Coronary Angiography;  Surgeon: Burnell Blanks, MD;  Location: [facility];  Service: Cardiovascular;  Laterality: N/A;   CARDIOVASCULAR STRESS TEST  08-15-2007   EF 59%   CHOLECYSTECTOMY OPEN     GALLBLADDER SURGERY  09/11/2011   HYSTEROTOMY     KNEE SURGERY  09/11/2011   SHOULDER SURGERY  09/11/2011   Right side   TONSILLECTOMY     TOTAL KNEE ARTHROPLASTY Right 04/13/2019   Procedure: RIGHT TOTAL KNEE ARTHROPLASTY;  Surgeon: Mcarthur Rossetti, MD;  Location: [facility];  Service: Orthopedics;  Laterality: Right;   US ECHOCARDIOGRAPHY  11/25/2015   Est EF 55-60%     Current Outpatient Medications  Medication Sig Dispense Refill   acetaminophen (TYLENOL) 500 MG tablet Take 1,000 mg by mouth every 6 (six) hours as needed for moderate pain.      amLODipine (NORVASC) 5 MG tablet Take 10 mg by mouth daily.      aspirin 81 MG chewable tablet Chew 81 mg by mouth  daily.     Cholecalciferol 250 MCG (10000 UT) TABS Take 1,000 Units by mouth daily.      cloNIDine (CATAPRES) 0.1 MG tablet Take 0.1 mg by mouth daily.      docusate sodium (COLACE) 100 MG capsule Take 200 mg by mouth daily.     famotidine (PEPCID) 20 MG tablet Take 10 mg by mouth daily.      levothyroxine (SYNTHROID) 125  MCG tablet Take 125 mcg by mouth every morning.     LINZESS 145 MCG CAPS capsule Take 145 mcg by mouth daily.      Multiple Vitamin (MULTIVITAMIN WITH MINERALS) TABS tablet Take 1 tablet by mouth daily.     nitroGLYCERIN (NITROSTAT) 0.4 MG SL tablet Place 1 tablet (0.4 mg total) under the tongue every 5 (five) minutes x 3 doses as needed for chest pain. 25 tablet 3   OLANZapine (ZYPREXA) 2.5 MG tablet Take 1 tablet (2.5 mg total) by mouth at bedtime. 20 tablet 0   olmesartan (BENICAR) 40 MG tablet Take 40 mg by mouth daily.     polyethylene glycol (MIRALAX) packet Take 17 g by mouth 2 (two) times daily. 60 packet 6   Probiotic Product (ALIGN) 4 MG CAPS Take 4 mg by mouth daily.     rosuvastatin (CRESTOR) 5 MG tablet Take 5 mg by mouth daily.     spironolactone (ALDACTONE) 25 MG tablet Take 25 mg by mouth every morning.     traMADol (ULTRAM) 50 MG tablet Take 1-2 tablets (50-100 mg total) by mouth at bedtime as needed. 40 tablet 0   traZODone (DESYREL) 50 MG tablet Take 50 mg by mouth at bedtime.     vitamin C (ASCORBIC ACID) 500 MG tablet Take 500 mg by mouth daily.     No current facility-administered medications for this visit.    Allergies:   Statins, Cholestatin, Rosuvastatin, Doxycycline, and Prednisone    Social History:  The patient  reports that she has never smoked. She has never used smokeless tobacco. She reports that she does not drink alcohol and does not use drugs.   Family History:  The patient's family history includes Hypertension in her brother and sister.    ROS:  Please see the history of present illness.      All other systems are reviewed and  negative.   Physical Exam: Blood pressure 136/70, pulse 76, height '5\' 1"'$  (1.549 m), SpO2 97 %.  GEN:  elderly female  HEENT: Normal NECK: No JVD; No carotid bruits LYMPHATICS: No lymphadenopathy CARDIAC: RRR , no murmurs, rubs, gallops RESPIRATORY:  Clear to auscultation without rales, wheezing or rhonchi  ABDOMEN: Soft, non-tender, non-distended MUSCULOSKELETAL:  2+ leg edema  SKIN: Warm and dry NEUROLOGIC:  Alert and oriented x 3    EKG:   February 20, 2021: Normal sinus rhythm at 76.  No ST or T wave changes.  Recent Labs: No results found for requested labs within last 8760 hours.    Lipid Panel    Component Value Date/Time   CHOL 159 09/21/2019 1545   TRIG 29 09/21/2019 1545   HDL 70 09/21/2019 1545   CHOLHDL 2.3 09/21/2019 1545   VLDL 6 09/21/2019 1545   LDLCALC 83 09/21/2019 1545   LDLDIRECT 109.5 03/29/2011 1007      Wt Readings from Last 3 Encounters:  02/18/20 132 lb (59.9 kg)  12/04/19 130 lb (59 kg)  09/26/19 145 lb 6.4 oz (66 kg)      Other studies Reviewed: Additional studies/ records that were reviewed today include: . Review of the above records demonstrates:    ASSESSMENT AND PLAN:  1. Minimal coronary artery disease by cath in May, 2017 no angina,   Overall has been very stable    2. Hyperlipidemia   3. Chronic cough:      4. Hypothyroidism 5. Status post Nissen fundoplication for acid reflux  6. Hypertension -    7.  Bilateral foot pain / burning :   pulses are difficult to feel. Will send for screening ABI   Current medicines are reviewed at length with the patient today.  The patient does not have concerns regarding medicines.  The following changes have been made:  no change  Labs/ tests ordered today include:  No orders of the defined types were placed in this encounter.   Disposition:   FU with me in 1 year     Signed, Mertie Moores, MD  02/20/2021 9:35 AM    [facility] [place],  [place], [place]  [address] Phone: [phone]; Fax: [phone]

## 2021-02-20 NOTE — Patient Instructions (Signed)
Medication Instructions:  Your physician recommends that you continue on your current medications as directed. Please refer to the Current Medication list given to you today.  *If you need a refill on your cardiac medications before your next appointment, please call your pharmacy*   Lab Work: None Ordered If you have labs (blood work) drawn today and your tests are completely normal, you will receive your results only by: [facility] (if you have MyChart) OR A paper copy in the mail If you have any lab test that is abnormal or we need to change your treatment, we will call you to review the results.   Testing/Procedures: Your physician has requested that you have a lower extremity arterial duplex. This test is an ultrasound of the arteries in the legs or arms. It looks at arterial blood flow in the legs and arms. Allow one hour for Lower and Upper Arterial scans. There are no restrictions or special instructions    Follow-Up: At [facility], you and your health needs are our priority.  As part of our continuing mission to provide you with exceptional heart care, we have created designated Provider Care Teams.  These Care Teams include your primary Cardiologist (physician) and Advanced Practice Providers (APPs -  Physician Assistants and Nurse Practitioners) who all work together to provide you with the care you need, when you need it.  We recommend signing up for the patient portal called "MyChart".  Sign up information is provided on this After Visit Summary.  MyChart is used to connect with patients for Virtual Visits (Telemedicine).  Patients are able to view lab/test results, encounter notes, upcoming appointments, etc.  Non-urgent messages can be sent to your provider as well.   To learn more about what you can do with MyChart, go to [email or web address].    Your next appointment:   1 year(s)  The format for your next appointment:   In Person  Provider:   You may see  Mertie Moores, MD or one of the following Advanced Practice Providers on your designated Care Team:   Richardson Dopp, PA-C [place], [place]

## 2021-02-25 ENCOUNTER — Other Ambulatory Visit: Payer: Self-pay | Admitting: Cardiovascular Disease

## 2021-02-25 DIAGNOSIS — M79672 Pain in left foot: Secondary | ICD-10-CM

## 2021-02-25 DIAGNOSIS — R600 Localized edema: Secondary | ICD-10-CM

## 2021-02-25 DIAGNOSIS — M79605 Pain in left leg: Secondary | ICD-10-CM

## 2021-02-25 DIAGNOSIS — I2510 Atherosclerotic heart disease of native coronary artery without angina pectoris: Secondary | ICD-10-CM

## 2021-02-25 DIAGNOSIS — M79604 Pain in right leg: Secondary | ICD-10-CM

## 2021-02-25 DIAGNOSIS — I739 Peripheral vascular disease, unspecified: Secondary | ICD-10-CM

## 2021-02-26 ENCOUNTER — Other Ambulatory Visit: Payer: Self-pay

## 2021-02-26 ENCOUNTER — Ambulatory Visit (HOSPITAL_COMMUNITY)
Admission: RE | Admit: 2021-02-26 | Discharge: 2021-02-26 | Disposition: A | Discharge status: Home / Self Care | Payer: Medicare Other | Source: Ambulatory Visit | Attending: Cardiovascular Disease | Admitting: Cardiovascular Disease

## 2021-02-26 DIAGNOSIS — M79604 Pain in right leg: Secondary | ICD-10-CM

## 2021-02-26 DIAGNOSIS — M79605 Pain in left leg: Secondary | ICD-10-CM | POA: Diagnosis not present

## 2021-02-26 DIAGNOSIS — M79671 Pain in right foot: Secondary | ICD-10-CM

## 2021-02-26 DIAGNOSIS — R600 Localized edema: Secondary | ICD-10-CM

## 2021-02-26 DIAGNOSIS — M79672 Pain in left foot: Secondary | ICD-10-CM

## 2021-02-26 DIAGNOSIS — I2510 Atherosclerotic heart disease of native coronary artery without angina pectoris: Secondary | ICD-10-CM | POA: Diagnosis not present

## 2021-02-26 DIAGNOSIS — I739 Peripheral vascular disease, unspecified: Secondary | ICD-10-CM

## 2021-04-08 DIAGNOSIS — E039 Hypothyroidism, unspecified: Secondary | ICD-10-CM | POA: Diagnosis not present

## 2021-04-08 DIAGNOSIS — R600 Localized edema: Secondary | ICD-10-CM | POA: Diagnosis not present

## 2021-04-08 DIAGNOSIS — D649 Anemia, unspecified: Secondary | ICD-10-CM | POA: Diagnosis not present

## 2021-04-08 DIAGNOSIS — I10 Essential (primary) hypertension: Secondary | ICD-10-CM | POA: Diagnosis not present

## 2021-04-08 DIAGNOSIS — E7800 Pure hypercholesterolemia, unspecified: Secondary | ICD-10-CM | POA: Diagnosis not present

## 2021-04-08 DIAGNOSIS — Z23 Encounter for immunization: Secondary | ICD-10-CM | POA: Diagnosis not present

## 2021-04-08 DIAGNOSIS — D72819 Decreased white blood cell count, unspecified: Secondary | ICD-10-CM | POA: Diagnosis not present

## 2021-06-08 DIAGNOSIS — M2550 Pain in unspecified joint: Secondary | ICD-10-CM | POA: Diagnosis not present

## 2021-06-08 DIAGNOSIS — M0609 Rheumatoid arthritis without rheumatoid factor, multiple sites: Secondary | ICD-10-CM | POA: Diagnosis not present

## 2021-06-08 DIAGNOSIS — R5382 Chronic fatigue, unspecified: Secondary | ICD-10-CM | POA: Diagnosis not present

## 2021-06-09 DIAGNOSIS — K5909 Other constipation: Secondary | ICD-10-CM | POA: Diagnosis not present

## 2021-06-09 DIAGNOSIS — K219 Gastro-esophageal reflux disease without esophagitis: Secondary | ICD-10-CM | POA: Diagnosis not present

## 2021-06-09 DIAGNOSIS — E039 Hypothyroidism, unspecified: Secondary | ICD-10-CM | POA: Diagnosis not present

## 2021-08-05 ENCOUNTER — Ambulatory Visit (HOSPITAL_COMMUNITY)
Admission: EM | Admit: 2021-08-05 | Discharge: 2021-08-05 | Disposition: A | Discharge status: Home / Self Care | Payer: Medicare Other | Attending: Emergency Medicine | Admitting: Emergency Medicine

## 2021-08-05 ENCOUNTER — Encounter (HOSPITAL_COMMUNITY): Payer: Self-pay

## 2021-08-05 ENCOUNTER — Other Ambulatory Visit: Payer: Self-pay

## 2021-08-05 DIAGNOSIS — B9689 Other specified bacterial agents as the cause of diseases classified elsewhere: Secondary | ICD-10-CM

## 2021-08-05 DIAGNOSIS — J019 Acute sinusitis, unspecified: Secondary | ICD-10-CM

## 2021-08-05 MED ORDER — AMOXICILLIN-POT CLAVULANATE 875-125 MG PO TABS: 1.0000 | ORAL_TABLET | Freq: Two times a day (BID) | ORAL | 0 refills | Status: DC

## 2021-08-05 NOTE — ED Provider Notes (Addendum)
[facility]    CSN: [id number] Arrival date & time: 08/05/21  1556      History   Chief Complaint Chief Complaint  Patient presents with   Cough    HPI [name] is a 88 y.o. female. She reports being ill for 3 weeks. Started as a cold, she felt like she was getting better, then she got worse again. Has been using mucinex and cough drops. Symptoms include coughing spasms, white sputum, white nasal discharge, post nasal drainage. Denies fever or chills.    Cough Associated symptoms: no chills, no fever, no myalgias, no shortness of breath, no sore throat and no wheezing    Past Medical History:  Diagnosis Date   Anemia 06/02/2012   Anginal pain (HCC)    complained of chest pain last week lasting momentarily week of 04/02/2019 took reflux medication, then took a nitro and laid down and got relief   Anxiety    Arthritis    CAD (coronary artery disease)    a. 2005 small inferoapical MI --> medical therapy b. 2007 - nomral coronaries c. 11/2015 mid to distal LAD 20% stenosis, 1st diag 30%, and 50%,--> medical therapy     Cataracts, bilateral 09/11/2011   Per report of pt.   Esophageal abnormality 09/11/2011   Surgically treated per pt.   GERD (gastroesophageal reflux disease)    Headache(784.0)    Hypercholesterolemia 09/11/2011   Hyperlipemia    Hypertension    Hypothyroid 09/11/2011   Hypothyroidism    Insect bite of ankle, left 09/11/2011   back of ankle   Kidney mass 09/11/2011   Per report of pt   Leukocytopenia    Liver mass 09/11/2011   Per report of pt   Mental health disorder    Myocardial infarction ([place]) 09/11/2011   Per pt in 2005 (small vessel)   Osteoporosis 09/11/2011   Pneumonia    Thigh cramp 09/11/2011   Left thigh x 3 nights since starting unspecified psychotropic medication    Patient Active Problem List   Diagnosis Date Noted   Chest pain 09/21/2019   Anemia due to acute blood loss 04/14/2019    Class: Acute   Status post  total right knee replacement 04/13/2019   Unilateral primary osteoarthritis, right knee 07/03/2018   Precordial pain    Chest pain in adult 12/08/2015   Weight loss 06/07/2014   Hyponatremia 10/02/2013   Chronic cough 10/02/2013   Anxiety disorder 10/02/2013   Chronic constipation 10/02/2013   Leukopenia 05/31/2013   Microcytic anemia 05/31/2013   Anemia of chronic illness 06/02/2012   Bruising 01/13/2012   Muscle ache 01/13/2012   Hypothyroidism    CAD (coronary artery disease)    Leukocytopenia    Chest pain 04/01/2011   CONSTIPATION 08/08/2008   COUGH 07/03/2007   Depression 06/26/2007   HYSTERECTOMY, HX OF 06/26/2007   Other acquired absence of organ 06/26/2007   COLON POLYP 09/15/2006   Hyperlipidemia 09/15/2006   OBESITY, NOS 09/15/2006   Essential hypertension, benign 09/15/2006   CORONARY, ARTERIOSCLEROSIS 09/15/2006   RHINITIS, ALLERGIC 09/15/2006   ASTHMA, UNSPECIFIED 09/15/2006   GERD (gastroesophageal reflux disease) 09/15/2006   DIVERTICULITIS OF COLON, NOS 09/15/2006   OSTEOARTHRITIS, MULTI SITES 09/15/2006   INCONTINENCE, URGE 09/15/2006    Past Surgical History:  Procedure Laterality Date   ABDOMINAL HYSTERECTOMY     APPENDECTOMY     BREAST BIOPSY     biopsy on R breast (benign)   BREAST EXCISIONAL BIOPSY  CARDIAC CATHETERIZATION N/A 12/10/2015   Procedure: Left Heart Cath and Coronary Angiography;  Surgeon: Burnell Blanks, MD;  Location: [facility];  Service: Cardiovascular;  Laterality: N/A;   CARDIOVASCULAR STRESS TEST  08-15-2007   EF 59%   CHOLECYSTECTOMY OPEN     GALLBLADDER SURGERY  09/11/2011   HYSTEROTOMY     KNEE SURGERY  09/11/2011   SHOULDER SURGERY  09/11/2011   Right side   TONSILLECTOMY     TOTAL KNEE ARTHROPLASTY Right 04/13/2019   Procedure: RIGHT TOTAL KNEE ARTHROPLASTY;  Surgeon: Mcarthur Rossetti, MD;  Location: [facility];  Service: Orthopedics;  Laterality: Right;   US ECHOCARDIOGRAPHY  11/25/2015   Est EF  55-60%    OB History   No obstetric history on file.      Home Medications    Prior to Admission medications   Medication Sig Start Date End Date Taking? Authorizing Provider  amoxicillin-clavulanate (AUGMENTIN) 875-125 MG tablet Take 1 tablet by mouth every 12 (twelve) hours. 08/05/21  Yes Carvel Getting, NP  acetaminophen (TYLENOL) 500 MG tablet Take 1,000 mg by mouth every 6 (six) hours as needed for moderate pain.     [provider]  amLODipine (NORVASC) 5 MG tablet Take 10 mg by mouth daily.  08/31/17   [provider]  aspirin 81 MG chewable tablet Chew 81 mg by mouth daily.    [provider]  Cholecalciferol 250 MCG (10000 UT) TABS Take 1,000 Units by mouth daily.     [provider]  cloNIDine (CATAPRES) 0.1 MG tablet Take 0.1 mg by mouth daily.     [provider]  docusate sodium (COLACE) 100 MG capsule Take 200 mg by mouth daily.    [provider]  famotidine (PEPCID) 20 MG tablet Take 10 mg by mouth daily.     [provider]  levothyroxine (SYNTHROID) 125 MCG tablet Take 125 mcg by mouth every morning. 12/04/20   [provider]  LINZESS 145 MCG CAPS capsule Take 145 mcg by mouth daily.  11/22/13   [provider]  Multiple Vitamin (MULTIVITAMIN WITH MINERALS) TABS tablet Take 1 tablet by mouth daily.    [provider]  nitroGLYCERIN (NITROSTAT) 0.4 MG SL tablet Place 1 tablet (0.4 mg total) under the tongue every 5 (five) minutes x 3 doses as needed for chest pain. 12/11/15   Cheryln Manly, NP  OLANZapine (ZYPREXA) 2.5 MG tablet Take 1 tablet (2.5 mg total) by mouth at bedtime. 12/04/19   Nuala Alpha A, PA-C  olmesartan (BENICAR) 40 MG tablet Take 40 mg by mouth daily.    [provider]  polyethylene glycol (MIRALAX) packet Take 17 g by mouth 2 (two) times daily. 10/04/13   Ginger Organ., MD  Probiotic Product (ALIGN) 4 MG CAPS Take 4 mg by mouth daily.     [provider]  rosuvastatin (CRESTOR) 5 MG tablet Take 5 mg by mouth daily. 01/25/20   [provider]  spironolactone (ALDACTONE) 25 MG tablet Take 25 mg by mouth every morning. 11/06/20   [provider]  traMADol (ULTRAM) 50 MG tablet Take 1-2 tablets (50-100 mg total) by mouth at bedtime as needed. 02/27/20   Pete Pelt, PA-C  traZODone (DESYREL) 50 MG tablet Take 50 mg by mouth at bedtime. 12/03/19   [provider]  vitamin C (ASCORBIC ACID) 500 MG tablet Take 500 mg by mouth daily.    [provider]  Family History Family History  Problem Relation Age of Onset   Hypertension Sister    Hypertension Brother    Breast cancer Neg Hx     Social History Social History   Tobacco Use   Smoking status: Never   Smokeless tobacco: Never  Vaping Use   Vaping Use: Never used  Substance Use Topics   Alcohol use: No   Drug use: No     Allergies   Statins, Cholestatin, Rosuvastatin, Doxycycline, and Prednisone   Review of Systems Review of Systems  Constitutional:  Negative for chills and fever.  HENT:  Positive for congestion and postnasal drip. Negative for sore throat.   Respiratory:  Positive for cough. Negative for shortness of breath and wheezing.   Musculoskeletal:  Negative for myalgias.    Physical Exam Triage Vital Signs ED Triage Vitals  Enc Vitals Group     BP 08/05/21 1729 (!) 159/68     Pulse Rate 08/05/21 1729 75     Resp 08/05/21 1729 20     Temp 08/05/21 1729 98.5 F (36.9 C)     Temp Source 08/05/21 1729 Oral     SpO2 08/05/21 1729 98 %     Weight --      Height --      Head Circumference --      Peak Flow --      Pain Score 08/05/21 1730 0     Pain Loc --      Pain Edu? --      Excl. in [place]? --    No data found.  Updated Vital Signs BP (!) 159/68 (BP Location: Right Arm)    Pulse 75    Temp 98.5 F (36.9 C) (Oral)    Resp 20    SpO2 98%   Visual Acuity Right Eye Distance:   Left Eye  Distance:   Bilateral Distance:    Right Eye Near:   Left Eye Near:    Bilateral Near:     Physical Exam Constitutional:      Appearance: Normal appearance. She is not ill-appearing.  HENT:     Right Ear: External ear normal. There is impacted cerumen.     Left Ear: External ear normal. There is impacted cerumen.     Nose: Congestion present.     Right Sinus: No maxillary sinus tenderness or frontal sinus tenderness.     Left Sinus: No maxillary sinus tenderness or frontal sinus tenderness.     Mouth/Throat:     Mouth: Mucous membranes are moist.     Pharynx: Oropharynx is clear.     Comments: Hoarse voice Cardiovascular:     Rate and Rhythm: Normal rate and regular rhythm.     Heart sounds: Murmur heard.  Pulmonary:     Effort: Pulmonary effort is normal.     Breath sounds: Normal breath sounds.     Comments: No cough overheard during H&P Neurological:     Mental Status: She is alert.     UC Treatments / Results  Labs (all labs ordered are listed, but only abnormal results are displayed) Labs Reviewed - No data to display  EKG   Radiology No results found.  Procedures Procedures (including critical care time)  Medications Ordered in UC Medications - No data to display  Initial Impression / Assessment and Plan / UC Course  I have reviewed the triage vital signs and the nursing notes.  Pertinent labs & imaging results that were available during my  care of the patient were reviewed by me and considered in my medical decision making (see chart for details).    Offered to have impacted cerumen B removed but patient was not able to stay for procedure.   Given she has been sick for 3 weeks, will tx for possible sinus infection.   Final Clinical Impressions(s) / UC Diagnoses   Final diagnoses:  Acute bacterial sinusitis   Discharge Instructions   None    ED Prescriptions     Medication Sig Dispense Auth. Provider   amoxicillin-clavulanate (AUGMENTIN)  875-125 MG tablet Take 1 tablet by mouth every 12 (twelve) hours. 14 tablet Carvel Getting, NP      PDMP not reviewed this encounter.   Carvel Getting, NP 08/05/21 1827    Carvel Getting, NP 08/05/21 1940

## 2021-08-05 NOTE — ED Triage Notes (Signed)
Pt c/o white productive cough, runny nose, and watery eyes x3 wks. States taking OTC meds with little relief.

## 2021-10-22 ENCOUNTER — Encounter (HOSPITAL_COMMUNITY): Payer: Self-pay

## 2021-10-22 ENCOUNTER — Other Ambulatory Visit: Payer: Self-pay

## 2021-10-22 ENCOUNTER — Ambulatory Visit (HOSPITAL_COMMUNITY)
Admission: RE | Admit: 2021-10-22 | Discharge: 2021-10-22 | Disposition: A | Discharge status: Home / Self Care | Payer: Medicare Other | Source: Ambulatory Visit | Attending: Internal Medicine | Admitting: Internal Medicine

## 2021-10-22 VITALS — BP 158/80 | HR 70 | Temp 97.9°F | Resp 22

## 2021-10-22 DIAGNOSIS — R0982 Postnasal drip: Secondary | ICD-10-CM

## 2021-10-22 DIAGNOSIS — J309 Allergic rhinitis, unspecified: Secondary | ICD-10-CM | POA: Diagnosis not present

## 2021-10-22 MED ORDER — LORATADINE 10 MG PO TABS: 10.0000 mg | ORAL_TABLET | Freq: Every day | ORAL | 0 refills | Status: DC

## 2021-10-22 MED ORDER — FLUTICASONE PROPIONATE 50 MCG/ACT NA SUSP: 1.0000 | Freq: Every day | NASAL | 0 refills | Status: DC

## 2021-10-22 NOTE — Discharge Instructions (Addendum)
Please use medications as prescribed ?Humidifier and VapoRub use will help with nasal congestion and postnasal drainage. ?If symptoms worsen please return to urgent care to be reevaluated ?

## 2021-10-22 NOTE — ED Triage Notes (Signed)
Cough, congestion, and runny nose.  Patient was placed on antibiotics.  Antibiotics seemed to help.  After finishing antibiotics, symptoms returned.  Patient has stuffy ears.  Patient has a cough.   ?

## 2021-10-23 NOTE — ED Provider Notes (Signed)
?[facility] ? ? ? ?CSN: [id number] ?Arrival date & time: 10/22/21  1517 ? ? ?  ? ?History   ?Chief Complaint ?Chief Complaint  ?Patient presents with  ? URI  ? ? ?HPI ?[name] is a 88 y.o. female comes to urgent care with several days history of nasal congestion, postnasal drip in cough.  Patient was recently treated for an infection with a bout of antibiotics.  Antibiotics held the patient but following that she is complaining of postnasal drainage and a wet cough.  No fever or chills.  No itchy eyes sore throat.  No shortness of breath or wheezing.  No diarrhea nausea/vomiting.  No chest pain or chest pressure.  No chest tightness. ?HPI ? ?Past Medical History:  ?Diagnosis Date  ? Anemia 06/02/2012  ? Anginal pain ([place])   ? complained of chest pain last week lasting momentarily week of 04/02/2019 took reflux medication, then took a nitro and laid down and got relief  ? Anxiety   ? Arthritis   ? CAD (coronary artery disease)   ? a. 2005 small inferoapical MI --> medical therapy b. 2007 - nomral coronaries c. 11/2015 mid to distal LAD 20% stenosis, 1st diag 30%, and 50%,--> medical therapy    ? Cataracts, bilateral 09/11/2011  ? Per report of pt.  ? Esophageal abnormality 09/11/2011  ? Surgically treated per pt.  ? GERD (gastroesophageal reflux disease)   ? Headache(784.0)   ? Hypercholesterolemia 09/11/2011  ? Hyperlipemia   ? Hypertension   ? Hypothyroid 09/11/2011  ? Hypothyroidism   ? Insect bite of ankle, left 09/11/2011  ? back of ankle  ? Kidney mass 09/11/2011  ? Per report of pt  ? Leukocytopenia   ? Liver mass 09/11/2011  ? Per report of pt  ? Mental health disorder   ? Myocardial infarction ([place]) 09/11/2011  ? Per pt in 2005 (small vessel)  ? Osteoporosis 09/11/2011  ? Pneumonia   ? Thigh cramp 09/11/2011  ? Left thigh x 3 nights since starting unspecified psychotropic medication  ? ? ?Patient Active Problem List  ? Diagnosis Date Noted  ? Chest pain 09/21/2019  ? Anemia due to acute blood  loss 04/14/2019  ?  Class: Acute  ? Status post total right knee replacement 04/13/2019  ? Unilateral primary osteoarthritis, right knee 07/03/2018  ? Precordial pain   ? Chest pain in adult 12/08/2015  ? Weight loss 06/07/2014  ? Hyponatremia 10/02/2013  ? Chronic cough 10/02/2013  ? Anxiety disorder 10/02/2013  ? Chronic constipation 10/02/2013  ? Leukopenia 05/31/2013  ? Microcytic anemia 05/31/2013  ? Anemia of chronic illness 06/02/2012  ? Bruising 01/13/2012  ? Muscle ache 01/13/2012  ? Hypothyroidism   ? CAD (coronary artery disease)   ? Leukocytopenia   ? Chest pain 04/01/2011  ? CONSTIPATION 08/08/2008  ? COUGH 07/03/2007  ? Depression 06/26/2007  ? HYSTERECTOMY, HX OF 06/26/2007  ? Other acquired absence of organ 06/26/2007  ? COLON POLYP 09/15/2006  ? Hyperlipidemia 09/15/2006  ? OBESITY, NOS 09/15/2006  ? Essential hypertension, benign 09/15/2006  ? CORONARY, ARTERIOSCLEROSIS 09/15/2006  ? RHINITIS, ALLERGIC 09/15/2006  ? ASTHMA, UNSPECIFIED 09/15/2006  ? GERD (gastroesophageal reflux disease) 09/15/2006  ? DIVERTICULITIS OF COLON, NOS 09/15/2006  ? OSTEOARTHRITIS, MULTI SITES 09/15/2006  ? INCONTINENCE, URGE 09/15/2006  ? ? ?Past Surgical History:  ?Procedure Laterality Date  ? ABDOMINAL HYSTERECTOMY    ? APPENDECTOMY    ? BREAST BIOPSY    ? biopsy on  R breast (benign)  ? BREAST EXCISIONAL BIOPSY    ? CARDIAC CATHETERIZATION N/A 12/10/2015  ? Procedure: Left Heart Cath and Coronary Angiography;  Surgeon: Burnell Blanks, MD;  Location: [facility];  Service: Cardiovascular;  Laterality: N/A;  ? CARDIOVASCULAR STRESS TEST  08-15-2007  ? EF 59%  ? CHOLECYSTECTOMY OPEN    ? GALLBLADDER SURGERY  09/11/2011  ? HYSTEROTOMY    ? KNEE SURGERY  09/11/2011  ? SHOULDER SURGERY  09/11/2011  ? Right side  ? TONSILLECTOMY    ? TOTAL KNEE ARTHROPLASTY Right 04/13/2019  ? Procedure: RIGHT TOTAL KNEE ARTHROPLASTY;  Surgeon: Mcarthur Rossetti, MD;  Location: [facility];  Service: Orthopedics;  Laterality:  Right;  ? US ECHOCARDIOGRAPHY  11/25/2015  ? Est EF 55-60%  ? ? ?OB History   ?No obstetric history on file. ?  ? ? ? ?Home Medications   ? ?Prior to Admission medications   ?Medication Sig Start Date End Date Taking? Authorizing Provider  ?fluticasone (FLONASE) 50 MCG/ACT nasal spray Place 1 spray into both nostrils daily. 10/22/21  Yes Lyam Provencio, Myrene Galas, MD  ?loratadine (CLARITIN) 10 MG tablet Take 1 tablet (10 mg total) by mouth daily. 10/22/21  Yes Anamae Rochelle, Myrene Galas, MD  ?acetaminophen (TYLENOL) 500 MG tablet Take 1,000 mg by mouth every 6 (six) hours as needed for moderate pain.     [provider]  ?amLODipine (NORVASC) 5 MG tablet Take 10 mg by mouth daily.  08/31/17   [provider]  ?aspirin 81 MG chewable tablet Chew 81 mg by mouth daily.    [provider]  ?Cholecalciferol 250 MCG (10000 UT) TABS Take 1,000 Units by mouth daily.     [provider]  ?cloNIDine (CATAPRES) 0.1 MG tablet Take 0.1 mg by mouth daily.     [provider]  ?docusate sodium (COLACE) 100 MG capsule Take 200 mg by mouth daily.    [provider]  ?famotidine (PEPCID) 20 MG tablet Take 10 mg by mouth daily.     [provider]  ?levothyroxine (SYNTHROID) 125 MCG tablet Take 125 mcg by mouth every morning. 12/04/20   [provider]  ?Rolan Lipa 145 MCG CAPS capsule Take 145 mcg by mouth daily.  11/22/13   [provider]  ?Multiple Vitamin (MULTIVITAMIN WITH MINERALS) TABS tablet Take 1 tablet by mouth daily.    [provider]  ?nitroGLYCERIN (NITROSTAT) 0.4 MG SL tablet Place 1 tablet (0.4 mg total) under the tongue every 5 (five) minutes x 3 doses as needed for chest pain. 12/11/15   Cheryln Manly, NP  ?OLANZapine (ZYPREXA) 2.5 MG tablet Take 1 tablet (2.5 mg total) by mouth at bedtime. 12/04/19   Nuala Alpha A, PA-C  ?olmesartan (BENICAR) 40 MG tablet Take 40 mg by mouth daily.    [provider]  ?polyethylene glycol (MIRALAX) packet  Take 17 g by mouth 2 (two) times daily. 10/04/13   Ginger Organ., MD  ?Probiotic Product (ALIGN) 4 MG CAPS Take 4 mg by mouth daily.    [provider]  ?rosuvastatin (CRESTOR) 5 MG tablet Take 5 mg by mouth daily. 01/25/20   [provider]  ?spironolactone (ALDACTONE) 25 MG tablet Take 25 mg by mouth every morning. 11/06/20   [provider]  ?traZODone (DESYREL) 50 MG tablet Take 50 mg by mouth at bedtime. 12/03/19   [provider]  ?vitamin C (ASCORBIC ACID) 500 MG tablet Take 500 mg by mouth  daily.    [provider]  ? ? ?Family History ?Family History  ?Problem Relation Age of Onset  ? Hypertension Sister   ? Hypertension Brother   ? Breast cancer Neg Hx   ? ? ?Social History ?Social History  ? ?Tobacco Use  ? Smoking status: Never  ? Smokeless tobacco: Never  ?Vaping Use  ? Vaping Use: Never used  ?Substance Use Topics  ? Alcohol use: No  ? Drug use: No  ? ? ? ?Allergies   ?Statins, Cholestatin, Rosuvastatin, Doxycycline, and Prednisone ? ? ?Review of Systems ?Review of Systems  ?Constitutional: Negative.   ?HENT:  Positive for congestion and postnasal drip. Negative for sore throat and tinnitus.   ?Eyes: Negative.   ?Respiratory:  Positive for cough. Negative for chest tightness, shortness of breath and wheezing.   ?Gastrointestinal: Negative.   ?Neurological: Negative.  Negative for headaches.  ? ? ?Physical Exam ?Triage Vital Signs ?ED Triage Vitals  ?Enc Vitals Group  ?   BP 10/22/21 1546 (!) 158/80  ?   Pulse Rate 10/22/21 1546 70  ?   Resp 10/22/21 1546 (!) 22  ?   Temp 10/22/21 1546 97.9 ?F (36.6 ?C)  ?   Temp Source 10/22/21 1546 Oral  ?   SpO2 10/22/21 1546 97 %  ?   Weight --   ?   Height --   ?   Head Circumference --   ?   Peak Flow --   ?   Pain Score 10/22/21 1542 0  ?   Pain Loc --   ?   Pain Edu? --   ?   Excl. in [place]? --   ? ?No data found. ? ?Updated Vital Signs ?BP (!) 158/80 (BP Location: Right Arm)   Pulse 70   Temp 97.9 ?F (36.6 ?C) (Oral)    Resp (!) 22   SpO2 97%  ? ?Visual Acuity ?Right Eye Distance:   ?Left Eye Distance:   ?Bilateral Distance:   ? ?Right Eye Near:   ?Left Eye Near:    ?Bilateral Near:    ? ?Physical Exam ?Vitals and nursing

## 2021-11-25 DIAGNOSIS — R059 Cough, unspecified: Secondary | ICD-10-CM | POA: Diagnosis not present

## 2021-11-25 DIAGNOSIS — E7800 Pure hypercholesterolemia, unspecified: Secondary | ICD-10-CM | POA: Diagnosis not present

## 2021-11-25 DIAGNOSIS — I10 Essential (primary) hypertension: Secondary | ICD-10-CM | POA: Diagnosis not present

## 2021-11-25 DIAGNOSIS — J302 Other seasonal allergic rhinitis: Secondary | ICD-10-CM | POA: Diagnosis not present

## 2021-11-25 DIAGNOSIS — H6123 Impacted cerumen, bilateral: Secondary | ICD-10-CM | POA: Diagnosis not present

## 2021-11-25 DIAGNOSIS — E039 Hypothyroidism, unspecified: Secondary | ICD-10-CM | POA: Diagnosis not present

## 2021-12-02 ENCOUNTER — Ambulatory Visit
Admission: RE | Admit: 2021-12-02 | Discharge: 2021-12-02 | Disposition: A | Discharge status: Home / Self Care | Payer: Medicare Other | Source: Ambulatory Visit | Attending: Family Medicine | Admitting: Family Medicine

## 2021-12-02 ENCOUNTER — Other Ambulatory Visit: Payer: Self-pay | Admitting: Family Medicine

## 2021-12-02 DIAGNOSIS — R634 Abnormal weight loss: Secondary | ICD-10-CM | POA: Diagnosis not present

## 2021-12-02 DIAGNOSIS — R059 Cough, unspecified: Secondary | ICD-10-CM | POA: Diagnosis not present

## 2021-12-02 DIAGNOSIS — R0602 Shortness of breath: Secondary | ICD-10-CM | POA: Diagnosis not present

## 2021-12-07 DIAGNOSIS — R5382 Chronic fatigue, unspecified: Secondary | ICD-10-CM | POA: Diagnosis not present

## 2021-12-07 DIAGNOSIS — M2550 Pain in unspecified joint: Secondary | ICD-10-CM | POA: Diagnosis not present

## 2021-12-07 DIAGNOSIS — M0609 Rheumatoid arthritis without rheumatoid factor, multiple sites: Secondary | ICD-10-CM | POA: Diagnosis not present

## 2021-12-08 DIAGNOSIS — E039 Hypothyroidism, unspecified: Secondary | ICD-10-CM | POA: Diagnosis not present

## 2021-12-08 DIAGNOSIS — Z1211 Encounter for screening for malignant neoplasm of colon: Secondary | ICD-10-CM | POA: Diagnosis not present

## 2021-12-08 DIAGNOSIS — I2510 Atherosclerotic heart disease of native coronary artery without angina pectoris: Secondary | ICD-10-CM | POA: Diagnosis not present

## 2021-12-08 DIAGNOSIS — E7800 Pure hypercholesterolemia, unspecified: Secondary | ICD-10-CM | POA: Diagnosis not present

## 2021-12-08 DIAGNOSIS — J302 Other seasonal allergic rhinitis: Secondary | ICD-10-CM | POA: Diagnosis not present

## 2021-12-08 DIAGNOSIS — Z0000 Encounter for general adult medical examination without abnormal findings: Secondary | ICD-10-CM | POA: Diagnosis not present

## 2021-12-08 DIAGNOSIS — R350 Frequency of micturition: Secondary | ICD-10-CM | POA: Diagnosis not present

## 2022-01-18 DIAGNOSIS — H6123 Impacted cerumen, bilateral: Secondary | ICD-10-CM | POA: Diagnosis not present

## 2022-01-18 DIAGNOSIS — J310 Chronic rhinitis: Secondary | ICD-10-CM | POA: Diagnosis not present

## 2022-01-18 DIAGNOSIS — R0982 Postnasal drip: Secondary | ICD-10-CM | POA: Diagnosis not present

## 2022-01-18 DIAGNOSIS — J343 Hypertrophy of nasal turbinates: Secondary | ICD-10-CM | POA: Diagnosis not present

## 2022-02-10 ENCOUNTER — Other Ambulatory Visit: Payer: Self-pay

## 2022-02-10 ENCOUNTER — Telehealth: Payer: Self-pay | Admitting: Cardiovascular Disease

## 2022-02-10 MED ORDER — NITROGLYCERIN 0.4 MG SL SUBL: 0.4000 mg | SUBLINGUAL_TABLET | SUBLINGUAL | 1 refills | Status: DC | PRN

## 2022-02-10 NOTE — Telephone Encounter (Signed)
Pt's medication was sent to pt's pharmacy as requested. Confirmation received.  °

## 2022-02-10 NOTE — Telephone Encounter (Signed)
*  STAT* If patient is at the pharmacy, call can be transferred to refill team.   1. Which medications need to be refilled? (please list name of each medication and dose if known) nitroGLYCERIN (NITROSTAT) 0.4 MG SL tablet  2. Which pharmacy/location (including street and city if local pharmacy) is medication to be sent to? [place], [place] - [address] AT [place]  3. Do they need a 30 day or 90 day supply? 7 day  Pt made an appt for 02/19/22 at 10:00. Requesting 7 day supply

## 2022-02-10 NOTE — Telephone Encounter (Signed)
nitroGLYCERIN (NITROSTAT) 0.4 MG SL tablet Medication Date: 02/10/2022 Department: [address] Office Ordering/Authorizing: Nahser, Wonda Cheng, MD   Order Providers  Prescribing Provider Encounter Provider  Nahser, Wonda Cheng, MD Willaim Sheng, CMA   Outpatient Medication Detail   Disp Refills Start End   nitroGLYCERIN (NITROSTAT) 0.4 MG SL tablet 25 tablet 1 02/10/2022    Sig - Route: Place 1 tablet (0.4 mg total) under the tongue every 5 (five) minutes x 3 doses as needed for chest pain. - Sublingual   Sent to pharmacy as: nitroGLYCERIN (NITROSTAT) 0.4 MG SL tablet   Notes to Pharmacy: Please call our office to schedule an yearly appointment with Dr. Acie Fredrickson for August 2023 before anymore refills. [phone]. Thank you 1st attempt   E-Prescribing Status: Receipt confirmed by pharmacy (02/10/2022 11:07 AM EDT)    [place], [place] AT [facility]   Additional Information  Associated Reports  View Encounter  Priority and Order Details

## 2022-02-15 ENCOUNTER — Other Ambulatory Visit: Payer: Self-pay | Admitting: Family Medicine

## 2022-02-15 DIAGNOSIS — Z1231 Encounter for screening mammogram for malignant neoplasm of breast: Secondary | ICD-10-CM

## 2022-02-19 ENCOUNTER — Ambulatory Visit (INDEPENDENT_AMBULATORY_CARE_PROVIDER_SITE_OTHER): Payer: Medicare Other | Admitting: Cardiovascular Disease

## 2022-02-19 ENCOUNTER — Encounter: Payer: Self-pay | Admitting: Cardiovascular Disease

## 2022-02-19 VITALS — BP 136/68 | HR 60 | Ht 61.0 in | Wt 153.0 lb

## 2022-02-19 DIAGNOSIS — I25119 Atherosclerotic heart disease of native coronary artery with unspecified angina pectoris: Secondary | ICD-10-CM | POA: Diagnosis not present

## 2022-02-19 MED ORDER — ISOSORBIDE MONONITRATE ER 30 MG PO TB24: 30.0000 mg | ORAL_TABLET | Freq: Every day | ORAL | 3 refills | Status: DC

## 2022-02-19 NOTE — Progress Notes (Signed)
Cardiology Office Note   Date:  02/19/2022   ID:  [name], [name] 1931-08-30, MRN [id number]  PCP:  Caren Macadam, MD  Cardiologist:   Mertie Moores, MD   Chief Complaint  Patient presents with   Coronary Artery Disease        Hypertension        1. Minimal coronary artery disease 2. Hyperlipidemia 3. Chronic cough 4. Hypothyroidism 5. Status post Nissen fundoplication for acid reflux 6. Hypertension  Previous Notes.   [name] is an elderly female with a history of chest pain with minimal coronary artery disease. She has a history of hyperlipidemia chronic cough she status post Nissen fundoplication for acid reflux. She has a history of hypertension.  She's continued to have some episodes of chest discomfort. These are fairly atypical.  Dec. 4, 2013: She has been having upper abdominal / lower chest discomfort pain.  The pain is eased with NTG.  She does not think the symptoms are due to soft reflux. She stopped her proton pump inhibitor last year. She was  concerned about side effects including osteoporosis.  Sept. 12, 2014:  She is doing well from a cardiac standpoint.  She continues to have problems with GERD. She has had a nissan fundiplication - helped the cough but now she had recurrent GERD.    She has been gaining weight.  Has some numbness on the lateral aspect of her right legs.   November 14, 2013:  She was recently hospitalized for generalized weakness and coughing.  She was found to have hyponatremia.  She has been feeling well.  She bought some compression hose but they were too long ( ended at the bend of her knee) and she could not wear them.  She is going to go back to the supply store to get a shorter pair.   She denies any CP or dyspnea.    Oct. 26, 2015:  [name] is doing well.    No CP or dyspnea.   November 12, 2014:   [name] is a 88 y.o. female who presents for follow up of her HTN Has occasional  palpitations.  Has had some leg pain  - especially in the morning.  Not worsened with exercise .   Oct. 26, 2016:  Doing well from a cardiac stnandpoint.    November 10, 2015: Complains of being cold No CP or dyspnea.   Oct. 25, 2017:  [name] is seen today for follow up visit  Has a history of minimal coronary artery disease, hypertension and hyperlipidemia.  Had some left sided chest pain this weekend. Took 2 SL NTG - relief of the pain in 1/2 hour. She originally thought it was indigestion,   Tried a pepcid complete  Had a cath in May that revealed only minimal CAD BP is high today  Did not take her BP meds this am.    June 18, 2019: [name] seen today for a follow-up of her coronary artery disease, hypertension, hyperlipidemia.. Doing well   .  No cp and no dyspnea.  Has a frequent cough.  VS look good .   February 18, 2020:  [name] is seen today for follow-up visit.  She has a history of mild coronary artery disease, hypertension, hyperlipidemia. Needs to have some dental surgery .  Needs pre op clearence   No cp , no dyspnea  Has occasional heartburn relieved with Pepcid.  Was seen in the ER several months ago for CP /  indigestion.  Ruled out for MI .   Aug. 5, 2022:  [name] is seen for follow up she has a history of mild nonobstructive coronary artery disease.  She has a history of hypertension hyperlipidemia.  She also has spastic dysphonia. Has chronic pain in her feet ,  feet feel like they are frozen  Has chronic edema - resolves every morning and then accumulates through the day   Will send for screening ABI   Aug. 4,   2023  Has occasional CP , not exertional  Lying down .   Better when she sits up  She thinks its acid reflux , relieved with water with baking soda .  She takes a NTG on occasion if the soda water does not help    Past Medical History:  Diagnosis Date   Anemia 06/02/2012   Anginal pain ([facility])    complained of chest pain last  week lasting momentarily week of 04/02/2019 took reflux medication, then took a nitro and laid down and got relief   Anxiety    Arthritis    CAD (coronary artery disease)    a. 2005 small inferoapical MI --> medical therapy b. 2007 - nomral coronaries c. 11/2015 mid to distal LAD 20% stenosis, 1st diag 30%, and 50%,--> medical therapy     Cataracts, bilateral 09/11/2011   Per report of pt.   Esophageal abnormality 09/11/2011   Surgically treated per pt.   GERD (gastroesophageal reflux disease)    Headache(784.0)    Hypercholesterolemia 09/11/2011   Hyperlipemia    Hypertension    Hypothyroid 09/11/2011   Hypothyroidism    Insect bite of ankle, left 09/11/2011   back of ankle   Kidney mass 09/11/2011   Per report of pt   Leukocytopenia    Liver mass 09/11/2011   Per report of pt   Mental health disorder    Myocardial infarction ([place]) 09/11/2011   Per pt in 2005 (small vessel)   Osteoporosis 09/11/2011   Pneumonia    Thigh cramp 09/11/2011   Left thigh x 3 nights since starting unspecified psychotropic medication    Past Surgical History:  Procedure Laterality Date   ABDOMINAL HYSTERECTOMY     APPENDECTOMY     BREAST BIOPSY     biopsy on R breast (benign)   BREAST EXCISIONAL BIOPSY     CARDIAC CATHETERIZATION N/A 12/10/2015   Procedure: Left Heart Cath and Coronary Angiography;  Surgeon: Burnell Blanks, MD;  Location: [facility];  Service: Cardiovascular;  Laterality: N/A;   CARDIOVASCULAR STRESS TEST  08-15-2007   EF 59%   CHOLECYSTECTOMY OPEN     GALLBLADDER SURGERY  09/11/2011   HYSTEROTOMY     KNEE SURGERY  09/11/2011   SHOULDER SURGERY  09/11/2011   Right side   TONSILLECTOMY     TOTAL KNEE ARTHROPLASTY Right 04/13/2019   Procedure: RIGHT TOTAL KNEE ARTHROPLASTY;  Surgeon: Mcarthur Rossetti, MD;  Location: [facility];  Service: Orthopedics;  Laterality: Right;   US ECHOCARDIOGRAPHY  11/25/2015   Est EF 55-60%     Current Outpatient Medications  Medication  Sig Dispense Refill   acetaminophen (TYLENOL) 500 MG tablet Take 1,000 mg by mouth every 6 (six) hours as needed for moderate pain.      amLODipine (NORVASC) 10 MG tablet Take 10 mg by mouth daily.     aspirin 81 MG chewable tablet Chew 81 mg by mouth daily.     Cholecalciferol 250 MCG (10000 UT) TABS  Take 1,000 Units by mouth daily.      cloNIDine (CATAPRES) 0.1 MG tablet Take 0.1 mg by mouth daily.      docusate sodium (COLACE) 100 MG capsule Take 200 mg by mouth daily.     famotidine (PEPCID) 20 MG tablet Take 10 mg by mouth daily.      fluticasone (FLONASE) 50 MCG/ACT nasal spray Place 1 spray into both nostrils daily. 16 g 0   hydroxychloroquine (PLAQUENIL) 200 MG tablet Take 200 mg by mouth 2 (two) times daily.     isosorbide mononitrate (IMDUR) 30 MG 24 hr tablet Take 1 tablet (30 mg total) by mouth daily. 90 tablet 3   levothyroxine (SYNTHROID) 125 MCG tablet Take 125 mcg by mouth every morning.     LINZESS 145 MCG CAPS capsule Take 145 mcg by mouth daily.      loratadine (CLARITIN) 10 MG tablet Take 1 tablet (10 mg total) by mouth daily. 30 tablet 0   Multiple Vitamin (MULTIVITAMIN WITH MINERALS) TABS tablet Take 1 tablet by mouth daily.     nitroGLYCERIN (NITROSTAT) 0.4 MG SL tablet Place 1 tablet (0.4 mg total) under the tongue every 5 (five) minutes x 3 doses as needed for chest pain. 25 tablet 1   OLANZapine (ZYPREXA) 2.5 MG tablet Take 1 tablet (2.5 mg total) by mouth at bedtime. 20 tablet 0   olmesartan (BENICAR) 40 MG tablet Take 40 mg by mouth daily.     polyethylene glycol (MIRALAX) packet Take 17 g by mouth 2 (two) times daily. 60 packet 6   Probiotic Product (ALIGN) 4 MG CAPS Take 4 mg by mouth daily.     rosuvastatin (CRESTOR) 5 MG tablet Take 5 mg by mouth daily.     spironolactone (ALDACTONE) 25 MG tablet Take 25 mg by mouth every morning.     traZODone (DESYREL) 50 MG tablet Take 50 mg by mouth at bedtime.     TRULANCE 3 MG TABS Take 1 tablet by mouth daily.      vitamin C (ASCORBIC ACID) 500 MG tablet Take 500 mg by mouth daily.     ipratropium (ATROVENT) 0.06 % nasal spray Place 2 sprays into both nostrils 2 (two) times daily. USE 2 SPRAYS IN EACH NOSTRIL TWICE DAILY (Patient not taking: Reported on 02/19/2022)     No current facility-administered medications for this visit.    Allergies:   Statins, Cholestatin, Rosuvastatin, Doxycycline, and Prednisone    Social History:  The patient  reports that she has never smoked. She has never used smokeless tobacco. She reports that she does not drink alcohol and does not use drugs.   Family History:  The patient's family history includes Hypertension in her brother and sister.    ROS:  Please see the history of present illness.      All other systems are reviewed and negative.   Physical Exam: Blood pressure 136/68, pulse 60, height '5\' 1"'$  (1.549 m), weight 153 lb (69.4 kg), SpO2 94 %.  GEN:  Well nourished, well developed in no acute distress HEENT: Normal NECK: No JVD; No carotid bruits LYMPHATICS: No lymphadenopathy CARDIAC: RRR , no murmurs, rubs, gallops RESPIRATORY:  Clear to auscultation without rales, wheezing or rhonchi  ABDOMEN: Soft, non-tender, non-distended MUSCULOSKELETAL:  No edema; No deformity  SKIN: Warm and dry NEUROLOGIC:  Alert and oriented x 3    EKG:    February 19, 2022: Normal sinus rhythm at 60.  Normal EKG.  Recent Labs: No results found  for requested labs within last 365 days.    Lipid Panel    Component Value Date/Time   CHOL 159 09/21/2019 1545   TRIG 29 09/21/2019 1545   HDL 70 09/21/2019 1545   CHOLHDL 2.3 09/21/2019 1545   VLDL 6 09/21/2019 1545   LDLCALC 83 09/21/2019 1545   LDLDIRECT 109.5 03/29/2011 1007      Wt Readings from Last 3 Encounters:  02/19/22 153 lb (69.4 kg)  02/18/20 132 lb (59.9 kg)  12/04/19 130 lb (59 kg)      Other studies Reviewed: Additional studies/ records that were reviewed today include: . Review of the above  records demonstrates:    ASSESSMENT AND PLAN:  1. Minimal coronary artery disease by cath in May, 2017 no angina,     Has CP ,  likely due to GERD Will start Imdur 30 mg a day to see if this help   2. Hyperlipidemia   3. Chronic cough:      4. Hypothyroidism 5. Status post Nissen fundoplication for acid reflux - still has significant GERD and CP   6. Hypertension -  BP is well controlled   7.  Bilateral foot pain / burning :    Current medicines are reviewed at length with the patient today.  The patient does not have concerns regarding medicines.  The following changes have been made:  no change  Labs/ tests ordered today include:   Orders Placed This Encounter  Procedures   EKG 12-Lead     Disposition:   FU with me in 1 year     Signed, Mertie Moores, MD  02/19/2022 10:59 AM    [facility] [place], [place], [place]  [address] Phone: [phone]; Fax: [phone]

## 2022-02-19 NOTE — Patient Instructions (Addendum)
  Medication Instructions:  START Imdur (Isosorbide Mononitrate) '30mg'$  daily *If you need a refill on your cardiac medications before your next appointment, please call your pharmacy*   Lab Work: NONE If you have labs (blood work) drawn today and your tests are completely normal, you will receive your results only by: [place] (if you have MyChart) OR A paper copy in the mail If you have any lab test that is abnormal or we need to change your treatment, we will call you to review the results.   Testing/Procedures: NONE   Follow-Up: At [facility], you and your health needs are our priority.  As part of our continuing mission to provide you with exceptional heart care, we have created designated Provider Care Teams.  These Care Teams include your primary Cardiologist (physician) and Advanced Practice Providers (APPs -  Physician Assistants and Nurse Practitioners) who all work together to provide you with the care you need, when you need it.  We recommend signing up for the patient portal called "MyChart".  Sign up information is provided on this After Visit Summary.  MyChart is used to connect with patients for Virtual Visits (Telemedicine).  Patients are able to view lab/test results, encounter notes, upcoming appointments, etc.  Non-urgent messages can be sent to your provider as well.   To learn more about what you can do with MyChart, go to [email or web address].    Your next appointment:   1 year(s)  The format for your next appointment:   In Person  Provider:   Quentin Mulling, or [place] {    Important Information About Sugar

## 2022-02-25 DIAGNOSIS — I2510 Atherosclerotic heart disease of native coronary artery without angina pectoris: Secondary | ICD-10-CM | POA: Diagnosis not present

## 2022-02-25 DIAGNOSIS — E039 Hypothyroidism, unspecified: Secondary | ICD-10-CM | POA: Diagnosis not present

## 2022-02-25 DIAGNOSIS — I10 Essential (primary) hypertension: Secondary | ICD-10-CM | POA: Diagnosis not present

## 2022-02-25 DIAGNOSIS — J302 Other seasonal allergic rhinitis: Secondary | ICD-10-CM | POA: Diagnosis not present

## 2022-02-25 DIAGNOSIS — K219 Gastro-esophageal reflux disease without esophagitis: Secondary | ICD-10-CM | POA: Diagnosis not present

## 2022-02-25 DIAGNOSIS — E7800 Pure hypercholesterolemia, unspecified: Secondary | ICD-10-CM | POA: Diagnosis not present

## 2022-02-25 DIAGNOSIS — R9389 Abnormal findings on diagnostic imaging of other specified body structures: Secondary | ICD-10-CM | POA: Diagnosis not present

## 2022-02-25 DIAGNOSIS — R059 Cough, unspecified: Secondary | ICD-10-CM | POA: Diagnosis not present

## 2022-02-27 ENCOUNTER — Other Ambulatory Visit: Payer: Self-pay | Admitting: Physician Assistant

## 2022-03-01 ENCOUNTER — Ambulatory Visit
Admission: RE | Admit: 2022-03-01 | Discharge: 2022-03-01 | Disposition: A | Discharge status: Home / Self Care | Payer: Medicare Other | Source: Ambulatory Visit | Attending: Family Medicine | Admitting: Family Medicine

## 2022-03-01 ENCOUNTER — Other Ambulatory Visit: Payer: Self-pay | Admitting: Family Medicine

## 2022-03-01 DIAGNOSIS — R9389 Abnormal findings on diagnostic imaging of other specified body structures: Secondary | ICD-10-CM

## 2022-03-01 DIAGNOSIS — R918 Other nonspecific abnormal finding of lung field: Secondary | ICD-10-CM | POA: Diagnosis not present

## 2022-03-04 ENCOUNTER — Ambulatory Visit: Payer: Medicare Other

## 2022-03-19 ENCOUNTER — Ambulatory Visit: Payer: Medicare Other

## 2022-04-15 ENCOUNTER — Ambulatory Visit
Admission: RE | Admit: 2022-04-15 | Discharge: 2022-04-15 | Disposition: A | Discharge status: Home / Self Care | Payer: Medicare Other | Source: Ambulatory Visit | Attending: Family Medicine | Admitting: Family Medicine

## 2022-04-15 DIAGNOSIS — Z1231 Encounter for screening mammogram for malignant neoplasm of breast: Secondary | ICD-10-CM

## 2022-04-28 DIAGNOSIS — E039 Hypothyroidism, unspecified: Secondary | ICD-10-CM | POA: Diagnosis not present

## 2022-04-28 DIAGNOSIS — I10 Essential (primary) hypertension: Secondary | ICD-10-CM | POA: Diagnosis not present

## 2022-04-28 DIAGNOSIS — R350 Frequency of micturition: Secondary | ICD-10-CM | POA: Diagnosis not present

## 2022-04-28 DIAGNOSIS — K5909 Other constipation: Secondary | ICD-10-CM | POA: Diagnosis not present

## 2022-04-28 DIAGNOSIS — R54 Age-related physical debility: Secondary | ICD-10-CM | POA: Diagnosis not present

## 2022-04-28 DIAGNOSIS — Z23 Encounter for immunization: Secondary | ICD-10-CM | POA: Diagnosis not present

## 2022-05-05 DIAGNOSIS — E871 Hypo-osmolality and hyponatremia: Secondary | ICD-10-CM | POA: Diagnosis not present

## 2022-05-12 DIAGNOSIS — E871 Hypo-osmolality and hyponatremia: Secondary | ICD-10-CM | POA: Diagnosis not present

## 2022-05-18 ENCOUNTER — Emergency Department (HOSPITAL_COMMUNITY)
Admission: EM | Admit: 2022-05-18 | Discharge: 2022-05-19 | Disposition: A | Discharge status: Home / Self Care | Payer: Medicare Other | Attending: Emergency Medicine | Admitting: Emergency Medicine

## 2022-05-18 ENCOUNTER — Other Ambulatory Visit: Payer: Self-pay

## 2022-05-18 ENCOUNTER — Emergency Department (HOSPITAL_COMMUNITY): Payer: Medicare Other

## 2022-05-18 ENCOUNTER — Encounter (HOSPITAL_COMMUNITY): Payer: Self-pay

## 2022-05-18 ENCOUNTER — Ambulatory Visit (HOSPITAL_COMMUNITY)
Admission: RE | Admit: 2022-05-18 | Discharge: 2022-05-18 | Disposition: A | Discharge status: Home / Self Care | Payer: Medicare Other | Source: Ambulatory Visit | Attending: Family Medicine | Admitting: Family Medicine

## 2022-05-18 ENCOUNTER — Ambulatory Visit (INDEPENDENT_AMBULATORY_CARE_PROVIDER_SITE_OTHER): Payer: Medicare Other

## 2022-05-18 VITALS — BP 142/77 | HR 80 | Temp 98.5°F | Resp 18

## 2022-05-18 DIAGNOSIS — Z7982 Long term (current) use of aspirin: Secondary | ICD-10-CM | POA: Diagnosis not present

## 2022-05-18 DIAGNOSIS — K6389 Other specified diseases of intestine: Secondary | ICD-10-CM | POA: Diagnosis not present

## 2022-05-18 DIAGNOSIS — R935 Abnormal findings on diagnostic imaging of other abdominal regions, including retroperitoneum: Secondary | ICD-10-CM | POA: Diagnosis not present

## 2022-05-18 DIAGNOSIS — K5900 Constipation, unspecified: Secondary | ICD-10-CM

## 2022-05-18 DIAGNOSIS — M160 Bilateral primary osteoarthritis of hip: Secondary | ICD-10-CM | POA: Diagnosis not present

## 2022-05-18 DIAGNOSIS — I700 Atherosclerosis of aorta: Secondary | ICD-10-CM | POA: Diagnosis not present

## 2022-05-18 DIAGNOSIS — R1010 Upper abdominal pain, unspecified: Secondary | ICD-10-CM

## 2022-05-18 DIAGNOSIS — R109 Unspecified abdominal pain: Secondary | ICD-10-CM | POA: Diagnosis not present

## 2022-05-18 DIAGNOSIS — Z79899 Other long term (current) drug therapy: Secondary | ICD-10-CM | POA: Diagnosis not present

## 2022-05-18 DIAGNOSIS — R140 Abdominal distension (gaseous): Secondary | ICD-10-CM | POA: Diagnosis not present

## 2022-05-18 LAB — CBC WITH DIFFERENTIAL/PLATELET
Abs Immature Granulocytes: 0.01 10*3/uL (ref 0.00–0.07)
Basophils Absolute: 0 10*3/uL (ref 0.0–0.1)
Basophils Relative: 1 %
Eosinophils Absolute: 0 10*3/uL (ref 0.0–0.5)
Eosinophils Relative: 1 %
HCT: 37.4 % (ref 36.0–46.0)
Hemoglobin: 12 g/dL (ref 12.0–15.0)
Immature Granulocytes: 0 %
Lymphocytes Relative: 51 %
Lymphs Abs: 1.4 10*3/uL (ref 0.7–4.0)
MCH: 24.2 pg — ABNORMAL LOW (ref 26.0–34.0)
MCHC: 32.1 g/dL (ref 30.0–36.0)
MCV: 75.4 fL — ABNORMAL LOW (ref 80.0–100.0)
Monocytes Absolute: 0.4 10*3/uL (ref 0.1–1.0)
Monocytes Relative: 15 %
Neutro Abs: 0.9 10*3/uL — ABNORMAL LOW (ref 1.7–7.7)
Neutrophils Relative %: 32 %
Platelets: 191 10*3/uL (ref 150–400)
RBC: 4.96 MIL/uL (ref 3.87–5.11)
RDW: 14.6 % (ref 11.5–15.5)
WBC: 2.8 10*3/uL — ABNORMAL LOW (ref 4.0–10.5)
nRBC: 0 % (ref 0.0–0.2)

## 2022-05-18 LAB — COMPREHENSIVE METABOLIC PANEL
ALT: 20 U/L (ref 0–44)
AST: 31 U/L (ref 15–41)
Albumin: 4.3 g/dL (ref 3.5–5.0)
Alkaline Phosphatase: 85 U/L (ref 38–126)
Anion gap: 11 (ref 5–15)
BUN: 7 mg/dL — ABNORMAL LOW (ref 8–23)
CO2: 25 mmol/L (ref 22–32)
Calcium: 10 mg/dL (ref 8.9–10.3)
Chloride: 93 mmol/L — ABNORMAL LOW (ref 98–111)
Creatinine, Ser: 0.81 mg/dL (ref 0.44–1.00)
GFR, Estimated: >60 mL/min (ref >60)
Glucose, Bld: 88 mg/dL (ref 70–99)
Potassium: 4.1 mmol/L (ref 3.5–5.1)
Sodium: 129 mmol/L — ABNORMAL LOW (ref 135–145)
Total Bilirubin: 0.8 mg/dL (ref 0.3–1.2)
Total Protein: 6.9 g/dL (ref 6.5–8.1)

## 2022-05-18 LAB — LIPASE, BLOOD: Lipase: 26 U/L (ref 11–51)

## 2022-05-18 MED ORDER — LACTULOSE 20 GM/30ML PO SOLN: 30.0000 mL | Freq: Two times a day (BID) | ORAL | 0 refills | Status: DC | PRN

## 2022-05-18 MED ORDER — IOHEXOL 350 MG/ML SOLN
75.0000 mL | Freq: Once | INTRAVENOUS | Status: AC | PRN
  Administered 2022-05-18: 75 mL via INTRAVENOUS

## 2022-05-18 NOTE — ED Triage Notes (Signed)
Pt states she hasnt had a BM in 5 days she has been taking stool softeners and miralax. She said she takes Trulance everyday. She denies any abdominal pain.   She said she stopped eating so she could have a BM but her son advised her to keep eating.

## 2022-05-18 NOTE — ED Provider Triage Note (Signed)
Emergency Medicine Provider Triage Evaluation Note  [name] , a 88 y.o. female  was evaluated in triage.  Pt complains of constipation over the last 5 days.  Has tried several over-the-counter treatments without success.  Long history of constipation, tends to be chronic, however usually works with home remedies, OTC treatments, and/or prescriptions.  Denies abdominal pain, urinary symptoms, N/V/D, or fevers.  Review of Systems  Positive:  Negative: See above  Physical Exam  BP 135/72 (BP Location: Right Arm)   Pulse 67   Temp 98.6 F (37 C)   Resp 18   Ht '5\' 1"'$  (1.549 m)   Wt 68 kg   SpO2 99%   BMI 28.34 kg/m  Gen:   Awake, no distress   Resp:  Normal effort, equal chest rise, CTAB MSK:   Moves extremities without difficulty  Other:  Abdomen minimally distended, overall soft, very mild general tenderness  Medical Decision Making  Medically screening exam initiated at 7:17 PM.  Appropriate orders placed.  [name] was informed that the remainder of the evaluation will be completed by another provider, this initial triage assessment does not replace that evaluation, and the importance of remaining in the ED until their evaluation is complete.     Prince Rome, [place] 90/30/09 1923

## 2022-05-18 NOTE — ED Notes (Signed)
Patient is being discharged from the Urgent Care and sent to the Emergency Department via POV . Per Vanessa Kick, MD, patient is in need of higher level of care due to possible small bowel obstruction. Patient is aware and verbalizes understanding of plan of care.  Vitals:   05/18/22 1536  BP: (!) 142/77  Pulse: 80  Resp: 18  Temp: 98.5 F (36.9 C)  SpO2: 97%

## 2022-05-18 NOTE — ED Triage Notes (Signed)
Pt states she has not had a BM in 5 days. Pt was sent from UC due to concern for a blockage and requests CT. Pt c/o nausea.

## 2022-05-18 NOTE — ED Provider Notes (Signed)
[facility]   [id number] 05/18/22 Arrival Time: 9470  ASSESSMENT & PLAN:  1. Upper abdominal pain   2. Constipation, unspecified constipation type   3. Abnormal x-ray of abdomen    Overall benign abdominal exam but given findings on 2V abdomen pt sent to ED via POV for further evaluation and CT. Stable upon discharge.  I have personally viewed the imaging studies ordered this visit. Dilated bowel. No air/fluid levels. No free air under diaphragm. Agree with radiology report.    Follow-up Information     Go to  [facility].   Specialty: Emergency Medicine Contact information: [address] [id number] [place] [phone]                Reviewed expectations re: course of current medical issues. Questions answered. Outlined signs and symptoms indicating need for more acute intervention. Patient verbalized understanding. After Visit Summary given.   SUBJECTIVE: History from: patient. [name] is a 87 y.o. female who presents with complaint of no BM x 5 days; h/o similar that usu responds to OTC medications. Is passing gas from rectum. Complains of mild upper abdominal discomfort that has been gradual in onset. Tolerating normal PO intake without n/v. No back pain. Is ambulatory with walker. No new medications. No chronic narcotic use.  No LMP recorded. Patient has had a hysterectomy.  Past Surgical History:  Procedure Laterality Date   ABDOMINAL HYSTERECTOMY     APPENDECTOMY     BREAST BIOPSY     biopsy on R breast (benign)   BREAST EXCISIONAL BIOPSY     CARDIAC CATHETERIZATION N/A 12/10/2015   Procedure: Left Heart Cath and Coronary Angiography;  Surgeon: Burnell Blanks, MD;  Location: [facility];  Service: Cardiovascular;  Laterality: N/A;   CARDIOVASCULAR STRESS TEST  08-15-2007   EF 59%   CHOLECYSTECTOMY OPEN     GALLBLADDER SURGERY   09/11/2011   HYSTEROTOMY     KNEE SURGERY  09/11/2011   SHOULDER SURGERY  09/11/2011   Right side   TONSILLECTOMY     TOTAL KNEE ARTHROPLASTY Right 04/13/2019   Procedure: RIGHT TOTAL KNEE ARTHROPLASTY;  Surgeon: Mcarthur Rossetti, MD;  Location: [facility];  Service: Orthopedics;  Laterality: Right;   US ECHOCARDIOGRAPHY  11/25/2015   Est EF 55-60%    OBJECTIVE:  Vitals:   05/18/22 1536  BP: (!) 142/77  Pulse: 80  Resp: 18  Temp: 98.5 F (36.9 C)  TempSrc: Oral  SpO2: 97%    General appearance: alert, oriented, no acute distress HEENT: [place]; AT; oropharynx moist Lungs: unlabored respirations Abdomen: soft; without significant distention; scant bowel sounds but present; without masses or organomegaly; without guarding or rebound tenderness Back: without reported CVA tenderness; FROM at waist Extremities: without LE edema; symmetrical; without gross deformities Skin: warm and dry Neurologic: normal gait Psychological: alert and cooperative; normal mood and affect    Imaging: DG Abd 2 Views  Result Date: 05/18/2022 CLINICAL DATA:  Constipation and bloating EXAM: ABDOMEN - 2 VIEW COMPARISON:  CT abdomen and pelvis 10/02/2013. CT abdomen and pelvis 02/23/2018. FINDINGS: Mildly dilated air-filled small bowel loops measuring up to 3.5 cm are seen in the left abdomen. Air seen throughout nondilated colon to the level the rectum. No suspicious calcifications are identified. There are atherosclerotic calcifications of the aorta. Visualized lung bases are clear. Degenerative changes affect the spine and hips. There are mildly dilated air-filled small bowel loops IMPRESSION: Mildly  dilated air-filled small bowel loops measuring up to 3.5 cm. Findings may be secondary to ileus or partial small bowel obstruction. Electronically Signed   By: Ronney Asters M.D.   On: 05/18/2022 16:49     Allergies  Allergen Reactions   Statins Other (See Comments)    Muscle aches   Cholestatin     Rosuvastatin Other (See Comments)   Doxycycline Itching and Rash   Prednisone Itching                                               Past Medical History:  Diagnosis Date   Anemia 06/02/2012   Anginal pain (HCC)    complained of chest pain last week lasting momentarily week of 04/02/2019 took reflux medication, then took a nitro and laid down and got relief   Anxiety    Arthritis    CAD (coronary artery disease)    a. 2005 small inferoapical MI --> medical therapy b. 2007 - nomral coronaries c. 11/2015 mid to distal LAD 20% stenosis, 1st diag 30%, and 50%,--> medical therapy     Cataracts, bilateral 09/11/2011   Per report of pt.   Esophageal abnormality 09/11/2011   Surgically treated per pt.   GERD (gastroesophageal reflux disease)    Headache(784.0)    Hypercholesterolemia 09/11/2011   Hyperlipemia    Hypertension    Hypothyroid 09/11/2011   Hypothyroidism    Insect bite of ankle, left 09/11/2011   back of ankle   Kidney mass 09/11/2011   Per report of pt   Leukocytopenia    Liver mass 09/11/2011   Per report of pt   Mental health disorder    Myocardial infarction ([place]) 09/11/2011   Per pt in 2005 (small vessel)   Osteoporosis 09/11/2011   Pneumonia    Thigh cramp 09/11/2011   Left thigh x 3 nights since starting unspecified psychotropic medication    Social History   Socioeconomic History   Marital status: Single    Spouse name: Not on file   Number of children: Not on file   Years of education: Not on file   Highest education level: Not on file  Occupational History   Not on file  Tobacco Use   Smoking status: Never   Smokeless tobacco: Never  Vaping Use   Vaping Use: Never used  Substance and Sexual Activity   Alcohol use: No   Drug use: No   Sexual activity: Not Currently  Other Topics Concern   Not on file  Social History Narrative   Not on file   Social Determinants of Health   Financial Resource Strain: Not on file  Food Insecurity: Not on file   Transportation Needs: Not on file  Physical Activity: Not on file  Stress: Not on file  Social Connections: Not on file  Intimate Partner Violence: Not on file    Family History  Problem Relation Age of Onset   Hypertension Sister    Hypertension Brother    Breast cancer Neg Hx      Vanessa Kick, MD 05/18/22 [phone]

## 2022-05-19 NOTE — ED Provider Notes (Signed)
[facility] EMERGENCY DEPARTMENT Provider Note   CSN: [id number] Arrival date & time: 05/18/22  1735     History  Chief Complaint  Patient presents with   Abdominal Pain    [name] is a 88 y.o. female.  88 year old female who presents with 5 days of constipation.  Has had no emesis or fever.  Has been taking over-the-counter medications without relief.  Went to urgent care center and had plain films of her abdomen which showed possible small bowel obstruction.  Was sent here for further management       Home Medications Prior to Admission medications   Medication Sig Start Date End Date Taking? Authorizing Provider  acetaminophen (TYLENOL) 500 MG tablet Take 1,000 mg by mouth every 6 (six) hours as needed for moderate pain.     [provider]  amLODipine (NORVASC) 10 MG tablet Take 10 mg by mouth daily. 11/10/21   [provider]  aspirin 81 MG chewable tablet Chew 81 mg by mouth daily.    [provider]  Cholecalciferol 250 MCG (10000 UT) TABS Take 1,000 Units by mouth daily.     [provider]  cloNIDine (CATAPRES) 0.1 MG tablet Take 0.1 mg by mouth daily.     [provider]  docusate sodium (COLACE) 100 MG capsule Take 200 mg by mouth daily.    [provider]  famotidine (PEPCID) 20 MG tablet Take 10 mg by mouth daily.     [provider]  fluticasone (FLONASE) 50 MCG/ACT nasal spray Place 1 spray into both nostrils daily. 10/22/21   Lamptey, Myrene Galas, MD  hydroxychloroquine (PLAQUENIL) 200 MG tablet Take 200 mg by mouth 2 (two) times daily. 11/10/21   [provider]  ipratropium (ATROVENT) 0.06 % nasal spray Place 2 sprays into both nostrils 2 (two) times daily. USE 2 SPRAYS IN EACH NOSTRIL TWICE DAILY 01/18/22   [provider]  isosorbide mononitrate (IMDUR) 30 MG 24 hr tablet Take 1 tablet (30 mg total) by mouth daily. 02/19/22   Nahser, Wonda Cheng, MD  Lactulose  20 GM/30ML SOLN Take 30 mLs (20 g total) by mouth 2 (two) times daily as needed (for constipation). 05/18/22   Vanessa Kick, MD  levothyroxine (SYNTHROID) 125 MCG tablet Take 125 mcg by mouth every morning. 12/04/20   [provider]  LINZESS 145 MCG CAPS capsule Take 145 mcg by mouth daily.  11/22/13   [provider]  loratadine (CLARITIN) 10 MG tablet Take 1 tablet (10 mg total) by mouth daily. 10/22/21   LampteyMyrene Galas, MD  Multiple Vitamin (MULTIVITAMIN WITH MINERALS) TABS tablet Take 1 tablet by mouth daily.    [provider]  nitroGLYCERIN (NITROSTAT) 0.4 MG SL tablet Place 1 tablet (0.4 mg total) under the tongue every 5 (five) minutes x 3 doses as needed for chest pain. 02/10/22   Nahser, Wonda Cheng, MD  OLANZapine (ZYPREXA) 2.5 MG tablet Take 1 tablet (2.5 mg total) by mouth at bedtime. 12/04/19   Nuala Alpha A, PA-C  olmesartan (BENICAR) 40 MG tablet Take 40 mg by mouth daily.    [provider]  polyethylene glycol (MIRALAX) packet Take 17 g by mouth 2 (two) times daily. 10/04/13   Ginger Organ., MD  Probiotic Product (ALIGN) 4 MG CAPS Take 4 mg by mouth daily.    [provider]  rosuvastatin (CRESTOR) 5 MG tablet Take 5 mg by mouth daily. 01/25/20   [provider]  spironolactone (ALDACTONE) 25 MG tablet Take 25 mg by mouth every morning. 11/06/20   [provider]  traZODone (DESYREL) 50 MG tablet Take 50 mg by mouth at bedtime. 12/03/19   [provider]  TRULANCE 3 MG TABS Take 1 tablet by mouth daily. 01/28/22   [provider]  vitamin C (ASCORBIC ACID) 500 MG tablet Take 500 mg by mouth daily.    [provider]      Allergies    Statins, Cholestatin, Rosuvastatin, Doxycycline, and Prednisone    Review of Systems   Review of Systems  All other systems reviewed and are negative.   Physical Exam Updated Vital Signs BP 121/69 (BP Location: Right Arm)   Pulse 80   Temp 98.1 F  (36.7 C)   Resp 17   Ht 1.549 m ('5\' 1"'$ )   Wt 68 kg   SpO2 100%   BMI 28.34 kg/m  Physical Exam Vitals and nursing note reviewed. Exam conducted with a chaperone present.  Constitutional:      General: She is not in acute distress.    Appearance: Normal appearance. She is well-developed. She is not toxic-appearing.  HENT:     Head: Normocephalic and atraumatic.  Eyes:     General: Lids are normal.     Conjunctiva/sclera: Conjunctivae normal.     Pupils: Pupils are equal, round, and reactive to light.  Neck:     Thyroid: No thyroid mass.     Trachea: No tracheal deviation.  Cardiovascular:     Rate and Rhythm: Normal rate and regular rhythm.     Heart sounds: Normal heart sounds. No murmur heard.    No gallop.  Pulmonary:     Effort: Pulmonary effort is normal. No respiratory distress.     Breath sounds: Normal breath sounds. No stridor. No decreased breath sounds, wheezing, rhonchi or rales.  Abdominal:     General: There is no distension.     Palpations: Abdomen is soft.     Tenderness: There is no abdominal tenderness. There is no guarding or rebound.  Genitourinary:    Comments: Soft stool noted in rectal vault Musculoskeletal:        General: No tenderness. Normal range of motion.     Cervical back: Normal range of motion and neck supple.  Skin:    General: Skin is warm and dry.     Findings: No abrasion or rash.  Neurological:     Mental Status: She is alert and oriented to person, place, and time. Mental status is at baseline.     GCS: GCS eye subscore is 4. GCS verbal subscore is 5. GCS motor subscore is 6.     Cranial Nerves: No cranial nerve deficit.     Sensory: No sensory deficit.     Motor: Motor function is intact.  Psychiatric:        Attention and Perception: Attention normal.        Speech: Speech normal.        Behavior: Behavior normal.     ED Results / Procedures / Treatments   Labs (all labs ordered are listed, but only abnormal results are  displayed) Labs Reviewed  CBC WITH DIFFERENTIAL/PLATELET - Abnormal; Notable for the following components:      Result Value   WBC 2.8 (*)    MCV 75.4 (*)    MCH 24.2 (*)    Neutro Abs 0.9 (*)    All other components within normal limits  COMPREHENSIVE METABOLIC PANEL - Abnormal; Notable for the following components:   Sodium 129 (*)    Chloride 93 (*)    BUN 7 (*)    All other components within normal limits  LIPASE, BLOOD  URINALYSIS, ROUTINE W REFLEX MICROSCOPIC    EKG None  Radiology CT Abdomen Pelvis W Contrast  Result Date: 05/18/2022 CLINICAL DATA:  Acute abdominal pain. EXAM: CT ABDOMEN AND PELVIS WITH CONTRAST TECHNIQUE: Multidetector CT imaging of the abdomen and pelvis was performed using the standard protocol following bolus administration of intravenous contrast. RADIATION DOSE REDUCTION: This exam was performed according to the departmental dose-optimization program which includes automated exposure control, adjustment of the mA and/or kV according to patient size and/or use of iterative reconstruction technique. CONTRAST:  72m OMNIPAQUE IOHEXOL 350 MG/ML SOLN COMPARISON:  CT abdomen and pelvis 02/23/2018 FINDINGS: Lower chest: No acute abnormality. Hepatobiliary: Gallbladder is surgically absent. There is prominence of the intrahepatic and common bile ducts similar to the prior study. Rounded hypodensities in the liver are too small to characterize and unchanged, likely small cysts. Pancreas: Subcentimeter rounded hypodensities are seen in the head of the pancreas, unchanged. There is no pancreatic ductal dilatation or surrounding inflammation. Spleen: Normal in size without focal abnormality. Adrenals/Urinary Tract: Bilateral renal cysts are present similar to the prior study with the largest measuring 2.4 cm. There is no hydronephrosis or perinephric fat stranding. The adrenal glands and bladder are within normal limits. Stomach/Bowel: Stomach is within normal limits.  Appendix appears normal. No evidence of bowel wall thickening, distention, or inflammatory changes. There is a large amount of stool throughout the colon. There is some fluid distention of small bowel loops without dilatation. Stomach is decompressed. Vascular/Lymphatic: Aortic atherosclerosis. No enlarged abdominal or pelvic lymph nodes. Reproductive: Status post hysterectomy. No adnexal masses. Other: No abdominal wall hernia or abnormality. No abdominopelvic ascites. Musculoskeletal: Multilevel degenerative changes affect the spine. IMPRESSION: 1. No acute localizing process in the abdomen or pelvis. 2. Large amount of stool throughout the colon. 3. Stable subcentimeter hypodensities in the head of the pancreas, likely cystic. This can be further evaluated with MRI if clinically warranted. 4. Stable prominence of the intrahepatic and common bile ducts, likely related to prior cholecystectomy. Aortic Atherosclerosis (ICD10-I70.0). Electronically Signed   By: ARonney AstersM.D.   On: 05/18/2022 23:33   DG Abdomen 1 View  Result Date: 05/18/2022 CLINICAL DATA:  Constipation and abdominal pain. Patient reports no bowel movement for 5 days. EXAM: ABDOMEN - 1 VIEW COMPARISON:  Radiograph earlier today. FINDINGS: Divided supine views of the abdomen. Similar gaseous small bowel distension primarily in the left abdomen. Air and stool throughout the colon. There is stool within the rectum. No obvious free air in the supine views. Right upper quadrant surgical clips typical of cholecystectomy. No visualized radiopaque calculi. Otherwise unchanged exam. IMPRESSION: Similar gaseous small bowel distension primarily in the left abdomen. Air and small to moderate stool throughout the colon. Favor generalized ileus. Overall no significant change from earlier today. Electronically Signed   By: MKeith RakeM.D.   On: 05/18/2022 20:32   DG Abd 2 Views  Result Date: 05/18/2022 CLINICAL DATA:  Constipation and bloating  EXAM: ABDOMEN - 2 VIEW COMPARISON:  CT abdomen and pelvis 10/02/2013. CT abdomen and pelvis 02/23/2018. FINDINGS: Mildly dilated air-filled small bowel loops measuring up to 3.5 cm are seen in the left abdomen. Air seen throughout nondilated colon to the level the rectum. No suspicious calcifications are identified. There are  atherosclerotic calcifications of the aorta. Visualized lung bases are clear. Degenerative changes affect the spine and hips. There are mildly dilated air-filled small bowel loops IMPRESSION: Mildly dilated air-filled small bowel loops measuring up to 3.5 cm. Findings may be secondary to ileus or partial small bowel obstruction. Electronically Signed   By: Ronney Asters M.D.   On: 05/18/2022 16:49    Procedures Procedures    Medications Ordered in ED Medications  iohexol (OMNIPAQUE) 350 MG/ML injection 75 mL (75 mLs Intravenous Contrast Given 05/18/22 2323)    ED Course/ Medical Decision Making/ A&P                           Medical Decision Making  Patient presented with abdominal pain concern for possible bowel obstruction.  Abdominal CT per my interpretation shows constipation.  She has no evidence of a surgical abdomen at this time.  Patient given enema with really good result.  Laboratory studies are reassuring at this time.  Will be discharged home.        Final Clinical Impression(s) / ED Diagnoses Final diagnoses:  None    Rx / DC Orders ED Discharge Orders     None         Lacretia Leigh, MD 05/19/22 1232

## 2022-05-28 ENCOUNTER — Telehealth: Payer: Self-pay

## 2022-05-28 NOTE — Telephone Encounter (Signed)
        Patient  visited [facility] on 11/1     Telephone encounter attempt :  1st  A HIPAA compliant voice message was left requesting a return call.  Instructed patient to call back    [place]  [phone] [address], [place], [place] [address] Phone: [phone] Email: [email or web address]

## 2022-06-07 DIAGNOSIS — M0609 Rheumatoid arthritis without rheumatoid factor, multiple sites: Secondary | ICD-10-CM | POA: Diagnosis not present

## 2022-06-07 DIAGNOSIS — R5382 Chronic fatigue, unspecified: Secondary | ICD-10-CM | POA: Diagnosis not present

## 2022-06-07 DIAGNOSIS — M2550 Pain in unspecified joint: Secondary | ICD-10-CM | POA: Diagnosis not present

## 2022-06-14 DIAGNOSIS — I2510 Atherosclerotic heart disease of native coronary artery without angina pectoris: Secondary | ICD-10-CM | POA: Diagnosis not present

## 2022-06-14 DIAGNOSIS — N182 Chronic kidney disease, stage 2 (mild): Secondary | ICD-10-CM | POA: Diagnosis not present

## 2022-06-14 DIAGNOSIS — N1831 Chronic kidney disease, stage 3a: Secondary | ICD-10-CM | POA: Diagnosis not present

## 2022-06-14 DIAGNOSIS — E039 Hypothyroidism, unspecified: Secondary | ICD-10-CM | POA: Diagnosis not present

## 2022-06-14 DIAGNOSIS — E871 Hypo-osmolality and hyponatremia: Secondary | ICD-10-CM | POA: Diagnosis not present

## 2022-06-14 DIAGNOSIS — R059 Cough, unspecified: Secondary | ICD-10-CM | POA: Diagnosis not present

## 2022-07-14 DIAGNOSIS — K5909 Other constipation: Secondary | ICD-10-CM | POA: Diagnosis not present

## 2022-07-21 DIAGNOSIS — R300 Dysuria: Secondary | ICD-10-CM | POA: Diagnosis not present

## 2022-08-04 DIAGNOSIS — R61 Generalized hyperhidrosis: Secondary | ICD-10-CM | POA: Diagnosis not present

## 2022-08-04 DIAGNOSIS — I10 Essential (primary) hypertension: Secondary | ICD-10-CM | POA: Diagnosis not present

## 2022-08-04 DIAGNOSIS — D72819 Decreased white blood cell count, unspecified: Secondary | ICD-10-CM | POA: Diagnosis not present

## 2022-08-04 DIAGNOSIS — E039 Hypothyroidism, unspecified: Secondary | ICD-10-CM | POA: Diagnosis not present

## 2022-08-04 DIAGNOSIS — R634 Abnormal weight loss: Secondary | ICD-10-CM | POA: Diagnosis not present

## 2022-08-30 DIAGNOSIS — N182 Chronic kidney disease, stage 2 (mild): Secondary | ICD-10-CM | POA: Diagnosis not present

## 2022-09-08 DIAGNOSIS — I129 Hypertensive chronic kidney disease with stage 1 through stage 4 chronic kidney disease, or unspecified chronic kidney disease: Secondary | ICD-10-CM | POA: Diagnosis not present

## 2022-09-08 DIAGNOSIS — N182 Chronic kidney disease, stage 2 (mild): Secondary | ICD-10-CM | POA: Diagnosis not present

## 2022-09-08 DIAGNOSIS — E871 Hypo-osmolality and hyponatremia: Secondary | ICD-10-CM | POA: Diagnosis not present

## 2022-09-08 DIAGNOSIS — E039 Hypothyroidism, unspecified: Secondary | ICD-10-CM | POA: Diagnosis not present

## 2022-09-22 DIAGNOSIS — E039 Hypothyroidism, unspecified: Secondary | ICD-10-CM | POA: Diagnosis not present

## 2022-09-23 DIAGNOSIS — H1045 Other chronic allergic conjunctivitis: Secondary | ICD-10-CM | POA: Diagnosis not present

## 2022-09-23 DIAGNOSIS — Z961 Presence of intraocular lens: Secondary | ICD-10-CM | POA: Diagnosis not present

## 2022-09-23 DIAGNOSIS — H11433 Conjunctival hyperemia, bilateral: Secondary | ICD-10-CM | POA: Diagnosis not present

## 2022-10-20 ENCOUNTER — Other Ambulatory Visit: Payer: Self-pay | Admitting: Internal Medicine

## 2022-10-20 DIAGNOSIS — R1319 Other dysphagia: Secondary | ICD-10-CM | POA: Diagnosis not present

## 2022-10-20 DIAGNOSIS — R141 Gas pain: Secondary | ICD-10-CM | POA: Diagnosis not present

## 2022-10-20 DIAGNOSIS — R194 Change in bowel habit: Secondary | ICD-10-CM | POA: Diagnosis not present

## 2022-10-20 DIAGNOSIS — R198 Other specified symptoms and signs involving the digestive system and abdomen: Secondary | ICD-10-CM | POA: Diagnosis not present

## 2022-10-21 ENCOUNTER — Other Ambulatory Visit: Payer: Self-pay | Admitting: Internal Medicine

## 2022-10-21 DIAGNOSIS — R1319 Other dysphagia: Secondary | ICD-10-CM

## 2022-11-01 ENCOUNTER — Ambulatory Visit
Admission: RE | Admit: 2022-11-01 | Discharge: 2022-11-01 | Disposition: A | Discharge status: Home / Self Care | Payer: Medicaid Other | Source: Ambulatory Visit | Attending: Internal Medicine

## 2022-11-01 DIAGNOSIS — R1319 Other dysphagia: Secondary | ICD-10-CM

## 2022-11-01 DIAGNOSIS — R1310 Dysphagia, unspecified: Secondary | ICD-10-CM | POA: Diagnosis not present

## 2022-12-01 DIAGNOSIS — K5909 Other constipation: Secondary | ICD-10-CM | POA: Diagnosis not present

## 2022-12-01 DIAGNOSIS — D72819 Decreased white blood cell count, unspecified: Secondary | ICD-10-CM | POA: Diagnosis not present

## 2022-12-01 DIAGNOSIS — R54 Age-related physical debility: Secondary | ICD-10-CM | POA: Diagnosis not present

## 2022-12-01 DIAGNOSIS — I2510 Atherosclerotic heart disease of native coronary artery without angina pectoris: Secondary | ICD-10-CM | POA: Diagnosis not present

## 2022-12-01 DIAGNOSIS — E039 Hypothyroidism, unspecified: Secondary | ICD-10-CM | POA: Diagnosis not present

## 2022-12-01 DIAGNOSIS — K219 Gastro-esophageal reflux disease without esophagitis: Secondary | ICD-10-CM | POA: Diagnosis not present

## 2022-12-01 DIAGNOSIS — M069 Rheumatoid arthritis, unspecified: Secondary | ICD-10-CM | POA: Diagnosis not present

## 2022-12-01 DIAGNOSIS — E7800 Pure hypercholesterolemia, unspecified: Secondary | ICD-10-CM | POA: Diagnosis not present

## 2022-12-01 DIAGNOSIS — I10 Essential (primary) hypertension: Secondary | ICD-10-CM | POA: Diagnosis not present

## 2022-12-01 DIAGNOSIS — Z0000 Encounter for general adult medical examination without abnormal findings: Secondary | ICD-10-CM | POA: Diagnosis not present

## 2022-12-07 ENCOUNTER — Other Ambulatory Visit: Payer: Self-pay | Admitting: Family Medicine

## 2022-12-07 DIAGNOSIS — E039 Hypothyroidism, unspecified: Secondary | ICD-10-CM | POA: Diagnosis not present

## 2022-12-07 DIAGNOSIS — K219 Gastro-esophageal reflux disease without esophagitis: Secondary | ICD-10-CM | POA: Diagnosis not present

## 2022-12-07 DIAGNOSIS — M8589 Other specified disorders of bone density and structure, multiple sites: Secondary | ICD-10-CM

## 2022-12-07 DIAGNOSIS — E7800 Pure hypercholesterolemia, unspecified: Secondary | ICD-10-CM | POA: Diagnosis not present

## 2022-12-07 DIAGNOSIS — I10 Essential (primary) hypertension: Secondary | ICD-10-CM | POA: Diagnosis not present

## 2022-12-09 ENCOUNTER — Other Ambulatory Visit (HOSPITAL_COMMUNITY): Payer: Self-pay | Admitting: *Deleted

## 2022-12-09 DIAGNOSIS — R1310 Dysphagia, unspecified: Secondary | ICD-10-CM

## 2022-12-16 ENCOUNTER — Encounter (HOSPITAL_COMMUNITY): Payer: 59

## 2022-12-20 DIAGNOSIS — M0609 Rheumatoid arthritis without rheumatoid factor, multiple sites: Secondary | ICD-10-CM | POA: Diagnosis not present

## 2022-12-20 DIAGNOSIS — E663 Overweight: Secondary | ICD-10-CM | POA: Diagnosis not present

## 2022-12-20 DIAGNOSIS — M1991 Primary osteoarthritis, unspecified site: Secondary | ICD-10-CM | POA: Diagnosis not present

## 2022-12-20 DIAGNOSIS — Z6826 Body mass index (BMI) 26.0-26.9, adult: Secondary | ICD-10-CM | POA: Diagnosis not present

## 2022-12-20 DIAGNOSIS — R5382 Chronic fatigue, unspecified: Secondary | ICD-10-CM | POA: Diagnosis not present

## 2022-12-22 DIAGNOSIS — H5203 Hypermetropia, bilateral: Secondary | ICD-10-CM | POA: Diagnosis not present

## 2022-12-24 ENCOUNTER — Encounter (HOSPITAL_COMMUNITY): Payer: 59

## 2022-12-28 DIAGNOSIS — R1319 Other dysphagia: Secondary | ICD-10-CM | POA: Diagnosis not present

## 2022-12-28 DIAGNOSIS — K5909 Other constipation: Secondary | ICD-10-CM | POA: Diagnosis not present

## 2023-01-03 ENCOUNTER — Ambulatory Visit (HOSPITAL_COMMUNITY)
Admission: RE | Admit: 2023-01-03 | Discharge: 2023-01-03 | Disposition: A | Discharge status: Home / Self Care | Payer: Medicare HMO | Source: Ambulatory Visit | Attending: Internal Medicine | Admitting: Internal Medicine

## 2023-01-03 ENCOUNTER — Ambulatory Visit (HOSPITAL_COMMUNITY)
Admission: RE | Admit: 2023-01-03 | Discharge: 2023-01-03 | Disposition: A | Discharge status: Home / Self Care | Payer: Medicare HMO | Source: Ambulatory Visit | Attending: Family Medicine | Admitting: Family Medicine

## 2023-01-03 DIAGNOSIS — R1314 Dysphagia, pharyngoesophageal phase: Secondary | ICD-10-CM

## 2023-01-03 DIAGNOSIS — R1310 Dysphagia, unspecified: Secondary | ICD-10-CM | POA: Diagnosis not present

## 2023-01-03 NOTE — Therapy (Signed)
Modified Barium Swallow Study  Patient Details  Name: Aviendha Hirano MRN: [id number] Date of Birth: February 06, 1932  Today's Date: 01/03/2023  Modified Barium Swallow completed.  Full report located under Chart Review in the Imaging Section.  History of Present Illness Velia Zais is an 89 y.o. female with PMH: anemia, anxiety, arthritis, GERD, esophageal abnormality surgically treated per patient (reported funduplication surgery about 6 years ago), HLD, HTN, liver mass, osteoporosis. She had an Esophagram on 11/01/22 which showed severe esophageal dysmotility, prominent cricopharyngeus, no evidence of esophageal mass, stricture or hiatal hernia. She presented to this [facility], referred by her GI MD. Patient told SLP her swallowing has been impaired since she had funduplication surgery about 6 years ago. She has sensation of food coming back up a few minutes after eating.   Clinical Impression Patient presents with a primary esophageal dysphagia but with an oropharyngeal swallow that is WFL-WNL. No penetration or aspiration observed, swallow initiated at level of vallecular sinus and no pharyngeal residuals s/p initial swallows. Known prominent cricopharyngeal bar (seen on Esophagram in April 2024) present as well as questionable cervical osteophytes. In addition, esophagus appears dilated as barium boluses transit through. Appearance of minimal retrograde movement of barium well below PES was observed, as well as stasis of barium in esophagus below PES. 13mm barium tablet did not transit past level of distal esophagus. SLP recommended patient eat more frequent smaller meals and discuss further recommendations with her GI MD. Factors that may increase risk of adverse event in presence of aspiration Rubye Oaks & Clearance Coots 2021): Respiratory or GI disease  Swallow Evaluation Recommendations Recommendations: PO diet PO Diet Recommendation: Dysphagia 3 (Mechanical soft);Dysphagia 2 (Finely chopped);Thin  liquids (Level 0) Liquid Administration via: Cup;Straw Medication Administration: Other (Comment) Supervision: Patient able to self-feed Postural changes: Position pt fully upright for meals;Stay upright 30-60 min after meals Recommended consults: Other(comment) (f/u with GI)     Angela Nevin, MA, CCC-SLP Speech Therapy

## 2023-02-09 ENCOUNTER — Ambulatory Visit (HOSPITAL_COMMUNITY)
Admission: RE | Admit: 2023-02-09 | Discharge: 2023-02-09 | Disposition: A | Discharge status: Home / Self Care | Payer: Medicare HMO | Attending: Internal Medicine | Admitting: Internal Medicine

## 2023-02-09 ENCOUNTER — Encounter (HOSPITAL_COMMUNITY)
Admission: RE | Disposition: A | Discharge status: Home / Self Care | Payer: Self-pay | Source: Home / Self Care | Attending: Internal Medicine

## 2023-02-09 ENCOUNTER — Encounter (HOSPITAL_COMMUNITY): Payer: Self-pay | Admitting: Internal Medicine

## 2023-02-09 DIAGNOSIS — K5900 Constipation, unspecified: Secondary | ICD-10-CM | POA: Diagnosis not present

## 2023-02-09 HISTORY — PX: ANAL RECTAL MANOMETRY: SHX6358

## 2023-02-09 SURGERY — MANOMETRY, ANORECTAL

## 2023-02-09 MED ORDER — FLEET ENEMA 7-19 GM/118ML RE ENEM
ENEMA | RECTAL | Status: DC | PRN
  Administered 2023-02-09: 1 via RECTAL

## 2023-02-09 MED ORDER — FLEET ENEMA 7-19 GM/118ML RE ENEM
ENEMA | RECTAL | Status: AC
  Filled 2023-02-09: qty 1

## 2023-02-09 NOTE — Progress Notes (Signed)
Anal rectal manometry procedure performed per protocol. Patient tolerated well. Balloon expulsion test performed; patient able to expel balloon after 40 seconds. Tolerated well.

## 2023-02-11 ENCOUNTER — Encounter (HOSPITAL_COMMUNITY): Payer: Self-pay | Admitting: Internal Medicine

## 2023-03-11 DIAGNOSIS — I2510 Atherosclerotic heart disease of native coronary artery without angina pectoris: Secondary | ICD-10-CM | POA: Diagnosis not present

## 2023-03-11 DIAGNOSIS — D649 Anemia, unspecified: Secondary | ICD-10-CM | POA: Diagnosis not present

## 2023-03-11 DIAGNOSIS — M8949 Other hypertrophic osteoarthropathy, multiple sites: Secondary | ICD-10-CM | POA: Diagnosis not present

## 2023-03-11 DIAGNOSIS — I10 Essential (primary) hypertension: Secondary | ICD-10-CM | POA: Diagnosis not present

## 2023-03-11 DIAGNOSIS — E7800 Pure hypercholesterolemia, unspecified: Secondary | ICD-10-CM | POA: Diagnosis not present

## 2023-03-11 DIAGNOSIS — E039 Hypothyroidism, unspecified: Secondary | ICD-10-CM | POA: Diagnosis not present

## 2023-03-16 DIAGNOSIS — K5909 Other constipation: Secondary | ICD-10-CM | POA: Diagnosis not present

## 2023-05-12 ENCOUNTER — Encounter: Payer: Self-pay | Admitting: Cardiovascular Disease

## 2023-05-12 NOTE — Progress Notes (Signed)
Cardiology Office Note   Date:  05/13/2023   ID:  Jady, Chhim [birth year], MRN [id number]  PCP:  Aliene Beams, MD  Cardiologist:   Kristeen Miss, MD   Chief Complaint  Patient presents with   Coronary Artery Disease        Hypertension        1. Minimal coronary artery disease 2. Hyperlipidemia 3. Chronic cough 4. Hypothyroidism 5. Status post Nissen fundoplication for acid reflux 6. Hypertension  Previous Notes.   Bettymae is an elderly female with a history of chest pain with minimal coronary artery disease. She has a history of hyperlipidemia chronic cough she status post Nissen fundoplication for acid reflux. She has a history of hypertension.  She's continued to have some episodes of chest discomfort. These are fairly atypical.  Dec. 4, 2013: She has been having upper abdominal / lower chest discomfort pain.  The pain is eased with NTG.  She does not think the symptoms are due to soft reflux. She stopped her proton pump inhibitor last year. She was  concerned about side effects including osteoporosis.  Sept. 12, 2014:  She is doing well from a cardiac standpoint.  She continues to have problems with GERD. She has had a nissan fundiplication - helped the cough but now she had recurrent GERD.    She has been gaining weight.  Has some numbness on the lateral aspect of her right legs.   November 14, 2013:  She was recently hospitalized for generalized weakness and coughing.  She was found to have hyponatremia.  She has been feeling well.  She bought some compression hose but they were too long ( ended at the bend of her knee) and she could not wear them.  She is going to go back to the supply store to get a shorter pair.   She denies any CP or dyspnea.    Oct. 26, 2015:  Laronica is doing well.    No CP or dyspnea.   November 12, 2014:   Terita Marveen Hankes is a 89 y.o. female who presents for follow up of her HTN Has occasional  palpitations.  Has had some leg pain  - especially in the morning.  Not worsened with exercise .   Oct. 26, 2016:  Doing well from a cardiac stnandpoint.    November 10, 2015: Complains of being cold No CP or dyspnea.   Oct. 25, 2017:  Maday is seen today for follow up visit  Has a history of minimal coronary artery disease, hypertension and hyperlipidemia.  Had some left sided chest pain this weekend. Took 2 SL NTG - relief of the pain in 1/2 hour. She originally thought it was indigestion,   Tried a pepcid complete  Had a cath in May that revealed only minimal CAD BP is high today  Did not take her BP meds this am.    June 18, 2019: Mrs. Faul seen today for a follow-up of her coronary artery disease, hypertension, hyperlipidemia.. Doing well   .  No cp and no dyspnea.  Has a frequent cough.  VS look good .   February 18, 2020:  Berton Mount is seen today for follow-up visit.  She has a history of mild coronary artery disease, hypertension, hyperlipidemia. Needs to have some dental surgery .  Needs pre op clearence   No cp , no dyspnea  Has occasional heartburn relieved with Pepcid.  Was seen in the ER several months ago for CP /  indigestion.  Ruled out for MI .   Aug. 5, 2022:  Fannye is seen for follow up she has a history of mild nonobstructive coronary artery disease.  She has a history of hypertension hyperlipidemia.  She also has spastic dysphonia. Has chronic pain in her feet ,  feet feel like they are frozen  Has chronic edema - resolves every morning and then accumulates through the day   Will send for screening ABI   Aug. 4,   2023  Has occasional CP , not exertional  Lying down .   Better when she sits up  She thinks its acid reflux , relieved with water with baking soda .  She takes a NTG on occasion if the soda water does not help    Oct. 25, 2024  Gurpreet is seen for follow up of her chest pain , nonobstructive CAD, HTN, HLD  Has spastic  dyspnonia   Complains of ankle swelling  Is not getting much exercise  Tries to avoid salt .   Avoids processed meats   Is on amlodipine 10 mg a day  Does not wear compression hose Does not elevated her legs   Will give her recommendations for the [facility] leg rest  Encourage her to get compression hose  Her last TSH was mildly elevated at 7 I've encouraged her to have her primary MD recheck it         Past Medical History:  Diagnosis Date   Anemia 06/02/2012   Anginal pain (HCC)    complained of chest pain last week lasting momentarily week of 04/02/2019 took reflux medication, then took a nitro and laid down and got relief   Anxiety    Arthritis    CAD (coronary artery disease)    a. 2005 small inferoapical MI --> medical therapy b. 2007 - nomral coronaries c. 11/2015 mid to distal LAD 20% stenosis, 1st diag 30%, and 50%,--> medical therapy     Cataracts, bilateral 09/11/2011   Per report of pt.   Esophageal abnormality 09/11/2011   Surgically treated per pt.   GERD (gastroesophageal reflux disease)    Headache(784.0)    Hypercholesterolemia 09/11/2011   Hyperlipemia    Hypertension    Hypothyroid 09/11/2011   Hypothyroidism    Insect bite of ankle, left 09/11/2011   back of ankle   Kidney mass 09/11/2011   Per report of pt   Leukocytopenia    Liver mass 09/11/2011   Per report of pt   Mental health disorder    Myocardial infarction (HCC) 09/11/2011   Per pt in 2005 (small vessel)   Osteoporosis 09/11/2011   Pneumonia    Thigh cramp 09/11/2011   Left thigh x 3 nights since starting unspecified psychotropic medication    Past Surgical History:  Procedure Laterality Date   ABDOMINAL HYSTERECTOMY     ANAL RECTAL MANOMETRY N/A 02/09/2023   Procedure: ANO RECTAL MANOMETRY;  Surgeon: Lynann Bologna, DO;  Location: [facility];  Service: Gastroenterology;  Laterality: N/A;   APPENDECTOMY     BREAST BIOPSY     biopsy on R breast (benign)   BREAST EXCISIONAL  BIOPSY     CARDIAC CATHETERIZATION N/A 12/10/2015   Procedure: Left Heart Cath and Coronary Angiography;  Surgeon: Kathleene Hazel, MD;  Location: [facility] INVASIVE CV LAB;  Service: Cardiovascular;  Laterality: N/A;   CARDIOVASCULAR STRESS TEST  08-15-2007   EF 59%   CHOLECYSTECTOMY OPEN     GALLBLADDER SURGERY  09/11/2011  HYSTEROTOMY     KNEE SURGERY  09/11/2011   SHOULDER SURGERY  09/11/2011   Right side   TONSILLECTOMY     TOTAL KNEE ARTHROPLASTY Right 04/13/2019   Procedure: RIGHT TOTAL KNEE ARTHROPLASTY;  Surgeon: Kathryne Hitch, MD;  Location: [facility];  Service: Orthopedics;  Laterality: Right;   US ECHOCARDIOGRAPHY  11/25/2015   Est EF 55-60%     Current Outpatient Medications  Medication Sig Dispense Refill   acetaminophen (TYLENOL) 500 MG tablet Take 1,000 mg by mouth every 6 (six) hours as needed for moderate pain. 325     amLODipine (NORVASC) 10 MG tablet Take 10 mg by mouth daily.     aspirin 81 MG chewable tablet Chew 81 mg by mouth daily. tablet     cetirizine (ZYRTEC) 10 MG tablet Take 10 mg by mouth daily.     Cholecalciferol 250 MCG (10000 UT) TABS Take 1,000 Units by mouth daily.      cloNIDine (CATAPRES) 0.1 MG tablet Take 0.1 mg by mouth daily.      docusate sodium (COLACE) 100 MG capsule Take 200 mg by mouth daily as needed.     famotidine (PEPCID) 10 MG tablet Take 10 mg by mouth daily.     levothyroxine (SYNTHROID) 100 MCG tablet Take 100 mcg by mouth every morning.     Multiple Vitamin (MULTIVITAMIN WITH MINERALS) TABS tablet Take 1 tablet by mouth daily.     nitroGLYCERIN (NITROSTAT) 0.4 MG SL tablet Place 1 tablet (0.4 mg total) under the tongue every 5 (five) minutes x 3 doses as needed for chest pain. 25 tablet 1   olmesartan (BENICAR) 40 MG tablet Take 40 mg by mouth daily.     Probiotic Product (ALIGN) 4 MG CAPS Take 4 mg by mouth daily.     rosuvastatin (CRESTOR) 5 MG tablet Take 5 mg by mouth daily.     spironolactone (ALDACTONE) 25 MG tablet  Take 25 mg by mouth every morning.     vitamin C (ASCORBIC ACID) 500 MG tablet Take 500 mg by mouth daily.     dextromethorphan-guaiFENesin (MUCINEX DM) 30-600 MG 12hr tablet 1 tablet Orally every 12 hrs     Multiple Vitamin (MULTIVITAMIN ADULT) TABS 1 tablet Orally Once a day for 30 day(s)     Psyllium (METAMUCIL 4 IN 1 FIBER) 55.6 % POWD 1tablespoon Orally once a day     No current facility-administered medications for this visit.    Allergies:   Statins, Cholestatin, Rosuvastatin, Doxycycline, and Prednisone    Social History:  The patient  reports that she has never smoked. She has never used smokeless tobacco. She reports that she does not drink alcohol and does not use drugs.   Family History:  The patient's family history includes Hypertension in her brother and sister.    ROS:  Please see the history of present illness.      All other systems are reviewed and negative.   Physical Exam: Blood pressure 130/80, pulse 68, height 5\' 1"  (1.549 m), weight 134 lb 12.8 oz (61.1 kg), SpO2 98%.     GEN:  Well nourished, well developed in no acute distress HEENT: Normal NECK: No JVD; No carotid bruits LYMPHATICS: No lymphadenopathy CARDIAC: RRR , no murmurs, rubs, gallops RESPIRATORY:  Clear to auscultation without rales, wheezing or rhonchi  ABDOMEN: Soft, non-tender, non-distended MUSCULOSKELETAL:  trace - 1+  ankle edema; No deformity  SKIN: Warm and dry NEUROLOGIC:  Alert and oriented x 3  EKG:    EKG Interpretation Date/Time:  Friday May 13 2023 16:43:20 EDT Ventricular Rate:  68 PR Interval:  158 QRS Duration:  74 QT Interval:  394 QTC Calculation: 418 R Axis:   69  Text Interpretation: Normal sinus rhythm Normal ECG When compared with ECG of 04-Dec-2019 08:36, No significant change since last tracing Confirmed by Kristeen Miss ([id number]) on 05/13/2023 5:10:58 PM    Recent Labs: 05/18/2022: ALT 20; BUN 7; Creatinine, Ser 0.81; Hemoglobin 12.0; Platelets  191; Potassium 4.1; Sodium 129    Lipid Panel    Component Value Date/Time   CHOL 159 09/21/2019 1545   TRIG 29 09/21/2019 1545   HDL 70 09/21/2019 1545   CHOLHDL 2.3 09/21/2019 1545   VLDL 6 09/21/2019 1545   LDLCALC 83 09/21/2019 1545   LDLDIRECT 109.5 03/29/2011 1007      Wt Readings from Last 3 Encounters:  05/13/23 134 lb 12.8 oz (61.1 kg)  05/18/22 150 lb (68 kg)  02/19/22 153 lb (69.4 kg)      Other studies Reviewed: Additional studies/ records that were reviewed today include: . Review of the above records demonstrates:    ASSESSMENT AND PLAN:  1. Minimal coronary artery disease by cath in May, 2017 no angina,     No significant angina .    2. Mild leg edema:   minimal pitting .   Her TSH is mildly elevated.   She keeps her legs down most of the day .    Gave her info on the [facility] leg rest   3. Chronic cough:    ? GERD   4. Hypothyroidism - her last TSH  is 7.0   5. Status post Nissen fundoplication for acid reflux -     6. Hypertension -   BP is well controlled.   She is on amlodipine 10 mg which may be contributing to her edema  Will reduce the amlodipine to 5 mg a day.  Increase her clonidine to 0.1 mg twice a day.  7.  Bilateral foot pain / burning :    Current medicines are reviewed at length with the patient today.  The patient does not have concerns regarding medicines.  The following changes have been made:  no change  Labs/ tests ordered today include:   Orders Placed This Encounter  Procedures   EKG 12-Lead     Disposition:   FU with me in 1 year     Signed, Kristeen Miss, MD  05/13/2023 5:09 PM    [facility] [address] [place], [place], [place]  [address] Phone: [phone]; Fax: [phone]

## 2023-05-13 ENCOUNTER — Ambulatory Visit: Payer: Medicare HMO | Attending: Cardiovascular Disease | Admitting: Cardiovascular Disease

## 2023-05-13 ENCOUNTER — Encounter: Payer: Self-pay | Admitting: Cardiovascular Disease

## 2023-05-13 VITALS — BP 130/80 | HR 68 | Ht 61.0 in | Wt 134.8 lb

## 2023-05-13 DIAGNOSIS — I25119 Atherosclerotic heart disease of native coronary artery with unspecified angina pectoris: Secondary | ICD-10-CM

## 2023-05-13 DIAGNOSIS — I10 Essential (primary) hypertension: Secondary | ICD-10-CM

## 2023-05-13 MED ORDER — NITROGLYCERIN 0.4 MG SL SUBL: 0.4000 mg | SUBLINGUAL_TABLET | SUBLINGUAL | 3 refills | Status: AC | PRN

## 2023-05-13 MED ORDER — CLONIDINE HCL 0.1 MG PO TABS: 0.1000 mg | ORAL_TABLET | Freq: Two times a day (BID) | ORAL | 3 refills | Status: DC

## 2023-05-13 MED ORDER — AMLODIPINE BESYLATE 5 MG PO TABS: 5.0000 mg | ORAL_TABLET | Freq: Every day | ORAL | 3 refills | Status: DC

## 2023-05-13 NOTE — Patient Instructions (Addendum)
Medication Instructions:  DECREASE Amlodipine to 5mg  daily INCREASE Clonidine to 0.1mg  twice daily *If you need a refill on your cardiac medications before your next appointment, please call your pharmacy*  Follow-Up: At [facility], you and your health needs are our priority.  As part of our continuing mission to provide you with exceptional heart care, we have created designated Provider Care Teams.  These Care Teams include your primary Cardiologist (physician) and Advanced Practice Providers (APPs -  Physician Assistants and Nurse Practitioners) who all work together to provide you with the care you need, when you need it.  We recommend signing up for the patient portal called "MyChart".  Sign up information is provided on this After Visit Summary.  MyChart is used to connect with patients for Virtual Visits (Telemedicine).  Patients are able to view lab/test results, encounter notes, upcoming appointments, etc.  Non-urgent messages can be sent to your provider as well.   To learn more about what you can do with MyChart, go to [email or web address].    Your next appointment:   6 month(s)  Provider:   Suzzanne Cloud, Charise Killian      For your  leg edema you  should do  the following 1. Leg elevation - I recommend the Lounge Dr. Leg rest.  See below for details  2. Salt restriction  -  Use potassium chloride instead of regular salt as a salt substitute. 3. Walk regularly 4. Compression hose - Medical Supply store  5. Weight loss    Available on [email or web address] Or  Go to [email or web address]

## 2023-05-26 ENCOUNTER — Other Ambulatory Visit: Payer: Self-pay | Admitting: Family Medicine

## 2023-05-26 ENCOUNTER — Encounter: Payer: Self-pay | Admitting: Cardiovascular Disease

## 2023-05-26 DIAGNOSIS — Z1231 Encounter for screening mammogram for malignant neoplasm of breast: Secondary | ICD-10-CM

## 2023-05-26 NOTE — Telephone Encounter (Signed)
Error

## 2023-06-27 ENCOUNTER — Ambulatory Visit
Admission: RE | Admit: 2023-06-27 | Discharge: 2023-06-27 | Disposition: A | Discharge status: Home / Self Care | Payer: Medicare HMO | Source: Ambulatory Visit | Attending: Family Medicine | Admitting: Family Medicine

## 2023-06-27 DIAGNOSIS — Z1231 Encounter for screening mammogram for malignant neoplasm of breast: Secondary | ICD-10-CM | POA: Diagnosis not present

## 2023-06-29 DIAGNOSIS — I10 Essential (primary) hypertension: Secondary | ICD-10-CM | POA: Diagnosis not present

## 2023-06-29 DIAGNOSIS — N3000 Acute cystitis without hematuria: Secondary | ICD-10-CM | POA: Diagnosis not present

## 2023-06-29 DIAGNOSIS — E7800 Pure hypercholesterolemia, unspecified: Secondary | ICD-10-CM | POA: Diagnosis not present

## 2023-06-29 DIAGNOSIS — Z23 Encounter for immunization: Secondary | ICD-10-CM | POA: Diagnosis not present

## 2023-06-29 DIAGNOSIS — E039 Hypothyroidism, unspecified: Secondary | ICD-10-CM | POA: Diagnosis not present

## 2023-06-30 ENCOUNTER — Other Ambulatory Visit: Payer: Self-pay | Admitting: Family Medicine

## 2023-06-30 DIAGNOSIS — R928 Other abnormal and inconclusive findings on diagnostic imaging of breast: Secondary | ICD-10-CM

## 2023-07-05 DIAGNOSIS — R1319 Other dysphagia: Secondary | ICD-10-CM | POA: Diagnosis not present

## 2023-07-05 DIAGNOSIS — R198 Other specified symptoms and signs involving the digestive system and abdomen: Secondary | ICD-10-CM | POA: Diagnosis not present

## 2023-07-05 DIAGNOSIS — R194 Change in bowel habit: Secondary | ICD-10-CM | POA: Diagnosis not present

## 2023-07-05 DIAGNOSIS — K5904 Chronic idiopathic constipation: Secondary | ICD-10-CM | POA: Diagnosis not present

## 2023-07-21 ENCOUNTER — Ambulatory Visit
Admission: RE | Admit: 2023-07-21 | Discharge: 2023-07-21 | Disposition: A | Discharge status: Home / Self Care | Payer: Medicare HMO | Source: Ambulatory Visit | Attending: Family Medicine | Admitting: Family Medicine

## 2023-07-21 ENCOUNTER — Other Ambulatory Visit: Payer: Self-pay | Admitting: Family Medicine

## 2023-07-21 DIAGNOSIS — N6489 Other specified disorders of breast: Secondary | ICD-10-CM

## 2023-07-21 DIAGNOSIS — R928 Other abnormal and inconclusive findings on diagnostic imaging of breast: Secondary | ICD-10-CM

## 2023-08-01 DIAGNOSIS — E663 Overweight: Secondary | ICD-10-CM | POA: Diagnosis not present

## 2023-08-01 DIAGNOSIS — M25559 Pain in unspecified hip: Secondary | ICD-10-CM | POA: Diagnosis not present

## 2023-08-01 DIAGNOSIS — Z6826 Body mass index (BMI) 26.0-26.9, adult: Secondary | ICD-10-CM | POA: Diagnosis not present

## 2023-08-01 DIAGNOSIS — M25519 Pain in unspecified shoulder: Secondary | ICD-10-CM | POA: Diagnosis not present

## 2023-08-01 DIAGNOSIS — R634 Abnormal weight loss: Secondary | ICD-10-CM | POA: Diagnosis not present

## 2023-08-01 DIAGNOSIS — M0609 Rheumatoid arthritis without rheumatoid factor, multiple sites: Secondary | ICD-10-CM | POA: Diagnosis not present

## 2023-08-01 DIAGNOSIS — M1991 Primary osteoarthritis, unspecified site: Secondary | ICD-10-CM | POA: Diagnosis not present

## 2023-08-01 DIAGNOSIS — R296 Repeated falls: Secondary | ICD-10-CM | POA: Diagnosis not present

## 2023-08-01 DIAGNOSIS — R5382 Chronic fatigue, unspecified: Secondary | ICD-10-CM | POA: Diagnosis not present

## 2023-08-15 ENCOUNTER — Inpatient Hospital Stay: Admission: RE | Admit: 2023-08-15 | Payer: Medicare HMO | Source: Ambulatory Visit

## 2023-08-17 ENCOUNTER — Other Ambulatory Visit: Payer: Self-pay | Admitting: Family Medicine

## 2023-08-17 DIAGNOSIS — M8589 Other specified disorders of bone density and structure, multiple sites: Secondary | ICD-10-CM

## 2023-09-12 DIAGNOSIS — R053 Chronic cough: Secondary | ICD-10-CM | POA: Diagnosis not present

## 2023-09-12 DIAGNOSIS — H6123 Impacted cerumen, bilateral: Secondary | ICD-10-CM | POA: Diagnosis not present

## 2023-09-14 DIAGNOSIS — M0609 Rheumatoid arthritis without rheumatoid factor, multiple sites: Secondary | ICD-10-CM | POA: Diagnosis not present

## 2023-09-21 DIAGNOSIS — E039 Hypothyroidism, unspecified: Secondary | ICD-10-CM | POA: Diagnosis not present

## 2023-10-31 DIAGNOSIS — M0609 Rheumatoid arthritis without rheumatoid factor, multiple sites: Secondary | ICD-10-CM | POA: Diagnosis not present

## 2023-10-31 DIAGNOSIS — R634 Abnormal weight loss: Secondary | ICD-10-CM | POA: Diagnosis not present

## 2023-10-31 DIAGNOSIS — R296 Repeated falls: Secondary | ICD-10-CM | POA: Diagnosis not present

## 2023-10-31 DIAGNOSIS — M1991 Primary osteoarthritis, unspecified site: Secondary | ICD-10-CM | POA: Diagnosis not present

## 2023-10-31 DIAGNOSIS — E663 Overweight: Secondary | ICD-10-CM | POA: Diagnosis not present

## 2023-10-31 DIAGNOSIS — R5382 Chronic fatigue, unspecified: Secondary | ICD-10-CM | POA: Diagnosis not present

## 2023-10-31 DIAGNOSIS — Z6827 Body mass index (BMI) 27.0-27.9, adult: Secondary | ICD-10-CM | POA: Diagnosis not present

## 2023-11-11 DIAGNOSIS — I2510 Atherosclerotic heart disease of native coronary artery without angina pectoris: Secondary | ICD-10-CM | POA: Diagnosis not present

## 2023-11-11 DIAGNOSIS — R300 Dysuria: Secondary | ICD-10-CM | POA: Diagnosis not present

## 2023-11-11 DIAGNOSIS — I10 Essential (primary) hypertension: Secondary | ICD-10-CM | POA: Diagnosis not present

## 2023-11-11 DIAGNOSIS — E039 Hypothyroidism, unspecified: Secondary | ICD-10-CM | POA: Diagnosis not present

## 2023-11-11 DIAGNOSIS — R54 Age-related physical debility: Secondary | ICD-10-CM | POA: Diagnosis not present

## 2023-11-11 DIAGNOSIS — F39 Unspecified mood [affective] disorder: Secondary | ICD-10-CM | POA: Diagnosis not present

## 2023-11-11 DIAGNOSIS — K5904 Chronic idiopathic constipation: Secondary | ICD-10-CM | POA: Diagnosis not present

## 2023-11-11 DIAGNOSIS — M8949 Other hypertrophic osteoarthropathy, multiple sites: Secondary | ICD-10-CM | POA: Diagnosis not present

## 2023-12-07 DIAGNOSIS — J302 Other seasonal allergic rhinitis: Secondary | ICD-10-CM | POA: Diagnosis not present

## 2023-12-07 DIAGNOSIS — Z0000 Encounter for general adult medical examination without abnormal findings: Secondary | ICD-10-CM | POA: Diagnosis not present

## 2023-12-07 DIAGNOSIS — E039 Hypothyroidism, unspecified: Secondary | ICD-10-CM | POA: Diagnosis not present

## 2023-12-07 DIAGNOSIS — E7800 Pure hypercholesterolemia, unspecified: Secondary | ICD-10-CM | POA: Diagnosis not present

## 2023-12-07 DIAGNOSIS — R54 Age-related physical debility: Secondary | ICD-10-CM | POA: Diagnosis not present

## 2023-12-07 DIAGNOSIS — I2510 Atherosclerotic heart disease of native coronary artery without angina pectoris: Secondary | ICD-10-CM | POA: Diagnosis not present

## 2023-12-07 DIAGNOSIS — I10 Essential (primary) hypertension: Secondary | ICD-10-CM | POA: Diagnosis not present

## 2023-12-07 DIAGNOSIS — K5904 Chronic idiopathic constipation: Secondary | ICD-10-CM | POA: Diagnosis not present

## 2023-12-07 DIAGNOSIS — R296 Repeated falls: Secondary | ICD-10-CM | POA: Diagnosis not present

## 2023-12-20 ENCOUNTER — Encounter

## 2023-12-20 ENCOUNTER — Other Ambulatory Visit

## 2024-01-25 ENCOUNTER — Encounter

## 2024-01-25 ENCOUNTER — Inpatient Hospital Stay: Admission: RE | Admit: 2024-01-25 | Source: Ambulatory Visit

## 2024-04-04 DIAGNOSIS — R5383 Other fatigue: Secondary | ICD-10-CM | POA: Diagnosis not present

## 2024-04-04 DIAGNOSIS — Z760 Encounter for issue of repeat prescription: Secondary | ICD-10-CM | POA: Diagnosis not present

## 2024-04-04 DIAGNOSIS — Z0000 Encounter for general adult medical examination without abnormal findings: Secondary | ICD-10-CM | POA: Diagnosis not present

## 2024-04-04 DIAGNOSIS — E559 Vitamin D deficiency, unspecified: Secondary | ICD-10-CM | POA: Diagnosis not present

## 2024-04-04 DIAGNOSIS — D709 Neutropenia, unspecified: Secondary | ICD-10-CM | POA: Diagnosis not present

## 2024-04-05 ENCOUNTER — Other Ambulatory Visit: Payer: Medicare HMO

## 2024-05-18 ENCOUNTER — Other Ambulatory Visit: Payer: Self-pay

## 2024-05-18 MED ORDER — AMLODIPINE BESYLATE 5 MG PO TABS: 5.0000 mg | ORAL_TABLET | Freq: Every day | ORAL | 0 refills | Status: AC

## 2024-05-22 ENCOUNTER — Other Ambulatory Visit: Payer: Self-pay | Admitting: Physician Assistant

## 2024-05-23 MED ORDER — CLONIDINE HCL 0.1 MG PO TABS: 0.1000 mg | ORAL_TABLET | Freq: Two times a day (BID) | ORAL | 0 refills | Status: DC

## 2024-06-24 ENCOUNTER — Other Ambulatory Visit: Payer: Self-pay | Admitting: Physician Assistant

## 2024-07-18 ENCOUNTER — Other Ambulatory Visit: Payer: Self-pay

## 2024-07-18 DIAGNOSIS — Z0000 Encounter for general adult medical examination without abnormal findings: Secondary | ICD-10-CM

## 2024-07-29 ENCOUNTER — Other Ambulatory Visit (HOSPITAL_BASED_OUTPATIENT_CLINIC_OR_DEPARTMENT_OTHER): Payer: Self-pay

## 2024-07-29 DIAGNOSIS — Z780 Asymptomatic menopausal state: Secondary | ICD-10-CM

## 2024-08-09 ENCOUNTER — Ambulatory Visit
Admission: RE | Admit: 2024-08-09 | Discharge: 2024-08-09 | Disposition: A | Discharge status: Home / Self Care | Source: Ambulatory Visit

## 2024-08-09 DIAGNOSIS — Z0000 Encounter for general adult medical examination without abnormal findings: Secondary | ICD-10-CM
